# Patient Record
Sex: Male | Born: 1950 | Race: White | Hispanic: No | Smoking: Former smoker
Health system: Southern US, Community
[De-identification: ages and names within clinical notes are randomized; demographics above are authoritative.]

## PROBLEM LIST (undated history)

## (undated) DIAGNOSIS — I639 Cerebral infarction, unspecified: Secondary | ICD-10-CM

## (undated) DIAGNOSIS — R05 Cough: Secondary | ICD-10-CM

## (undated) DIAGNOSIS — G8929 Other chronic pain: Secondary | ICD-10-CM

## (undated) DIAGNOSIS — M542 Cervicalgia: Secondary | ICD-10-CM

## (undated) DIAGNOSIS — Z923 Personal history of irradiation: Secondary | ICD-10-CM

## (undated) DIAGNOSIS — K449 Diaphragmatic hernia without obstruction or gangrene: Secondary | ICD-10-CM

## (undated) DIAGNOSIS — B1920 Unspecified viral hepatitis C without hepatic coma: Secondary | ICD-10-CM

## (undated) DIAGNOSIS — C3490 Malignant neoplasm of unspecified part of unspecified bronchus or lung: Secondary | ICD-10-CM

## (undated) DIAGNOSIS — M1990 Unspecified osteoarthritis, unspecified site: Secondary | ICD-10-CM

## (undated) DIAGNOSIS — R059 Cough, unspecified: Secondary | ICD-10-CM

## (undated) DIAGNOSIS — M549 Dorsalgia, unspecified: Secondary | ICD-10-CM

## (undated) DIAGNOSIS — I4891 Unspecified atrial fibrillation: Secondary | ICD-10-CM

## (undated) DIAGNOSIS — K5790 Diverticulosis of intestine, part unspecified, without perforation or abscess without bleeding: Secondary | ICD-10-CM

## (undated) DIAGNOSIS — G4700 Insomnia, unspecified: Secondary | ICD-10-CM

## (undated) DIAGNOSIS — Z860100 Personal history of colon polyps, unspecified: Secondary | ICD-10-CM

## (undated) DIAGNOSIS — F419 Anxiety disorder, unspecified: Secondary | ICD-10-CM

## (undated) DIAGNOSIS — K219 Gastro-esophageal reflux disease without esophagitis: Secondary | ICD-10-CM

## (undated) DIAGNOSIS — D171 Benign lipomatous neoplasm of skin and subcutaneous tissue of trunk: Principal | ICD-10-CM

## (undated) DIAGNOSIS — K222 Esophageal obstruction: Secondary | ICD-10-CM

## (undated) DIAGNOSIS — Z8719 Personal history of other diseases of the digestive system: Secondary | ICD-10-CM

## (undated) HISTORY — DX: Cerebral infarction, unspecified: I63.9

## (undated) HISTORY — DX: Diaphragmatic hernia without obstruction or gangrene: K44.9

## (undated) HISTORY — DX: Malignant neoplasm of unspecified part of unspecified bronchus or lung: C34.90

## (undated) HISTORY — DX: Unspecified osteoarthritis, unspecified site: M19.90

## (undated) HISTORY — DX: Gastro-esophageal reflux disease without esophagitis: K21.9

## (undated) HISTORY — DX: Esophageal obstruction: K22.2

## (undated) HISTORY — DX: Diverticulosis of intestine, part unspecified, without perforation or abscess without bleeding: K57.90

## (undated) HISTORY — DX: Benign lipomatous neoplasm of skin and subcutaneous tissue of trunk: D17.1

## (undated) HISTORY — PX: APPENDECTOMY: SHX54

## (undated) HISTORY — DX: Unspecified viral hepatitis C without hepatic coma: B19.20

## (undated) HISTORY — PX: INNER EAR SURGERY: SHX679

## (undated) HISTORY — PX: COLONOSCOPY: SHX174

---

## 1999-12-16 ENCOUNTER — Encounter: Payer: Self-pay | Admitting: Emergency Medicine

## 1999-12-16 ENCOUNTER — Emergency Department (HOSPITAL_COMMUNITY): Admission: EM | Admit: 1999-12-16 | Discharge: 1999-12-16 | Payer: Self-pay | Admitting: Emergency Medicine

## 2002-08-29 ENCOUNTER — Encounter: Payer: Self-pay | Admitting: *Deleted

## 2002-08-29 ENCOUNTER — Ambulatory Visit (HOSPITAL_COMMUNITY): Admission: RE | Admit: 2002-08-29 | Discharge: 2002-08-29 | Payer: Self-pay | Admitting: *Deleted

## 2002-09-11 ENCOUNTER — Encounter: Payer: Self-pay | Admitting: *Deleted

## 2002-09-13 ENCOUNTER — Ambulatory Visit (HOSPITAL_COMMUNITY): Admission: RE | Admit: 2002-09-13 | Discharge: 2002-09-13 | Payer: Self-pay | Admitting: *Deleted

## 2002-11-04 ENCOUNTER — Ambulatory Visit (HOSPITAL_BASED_OUTPATIENT_CLINIC_OR_DEPARTMENT_OTHER): Admission: RE | Admit: 2002-11-04 | Discharge: 2002-11-04 | Payer: Self-pay | Admitting: Orthopedic Surgery

## 2002-12-06 ENCOUNTER — Ambulatory Visit (HOSPITAL_COMMUNITY): Admission: RE | Admit: 2002-12-06 | Discharge: 2002-12-06 | Payer: Self-pay | Admitting: Orthopedic Surgery

## 2002-12-06 ENCOUNTER — Encounter: Payer: Self-pay | Admitting: Orthopedic Surgery

## 2003-01-01 ENCOUNTER — Encounter (INDEPENDENT_AMBULATORY_CARE_PROVIDER_SITE_OTHER): Payer: Self-pay | Admitting: Specialist

## 2003-01-01 ENCOUNTER — Ambulatory Visit (HOSPITAL_COMMUNITY): Admission: RE | Admit: 2003-01-01 | Discharge: 2003-01-01 | Payer: Self-pay | Admitting: Orthopedic Surgery

## 2003-02-25 ENCOUNTER — Encounter: Admission: RE | Admit: 2003-02-25 | Discharge: 2003-05-26 | Payer: Self-pay | Admitting: Orthopedic Surgery

## 2003-03-15 HISTORY — PX: HAND SURGERY: SHX662

## 2003-05-29 ENCOUNTER — Ambulatory Visit (HOSPITAL_COMMUNITY): Admission: RE | Admit: 2003-05-29 | Discharge: 2003-05-29 | Payer: Self-pay | Admitting: Orthopedic Surgery

## 2003-07-09 ENCOUNTER — Ambulatory Visit (HOSPITAL_COMMUNITY): Admission: RE | Admit: 2003-07-09 | Discharge: 2003-07-09 | Payer: Self-pay | Admitting: Orthopedic Surgery

## 2003-07-09 ENCOUNTER — Ambulatory Visit (HOSPITAL_BASED_OUTPATIENT_CLINIC_OR_DEPARTMENT_OTHER): Admission: RE | Admit: 2003-07-09 | Discharge: 2003-07-09 | Payer: Self-pay | Admitting: Orthopedic Surgery

## 2003-08-19 ENCOUNTER — Encounter: Admission: RE | Admit: 2003-08-19 | Discharge: 2003-10-17 | Payer: Self-pay | Admitting: Orthopedic Surgery

## 2003-12-05 ENCOUNTER — Inpatient Hospital Stay (HOSPITAL_COMMUNITY): Admission: RE | Admit: 2003-12-05 | Discharge: 2003-12-06 | Payer: Self-pay | Admitting: Neurosurgery

## 2004-05-27 ENCOUNTER — Ambulatory Visit: Payer: Self-pay | Admitting: Cardiology

## 2004-05-31 ENCOUNTER — Ambulatory Visit: Payer: Self-pay | Admitting: Cardiology

## 2004-06-02 ENCOUNTER — Ambulatory Visit: Payer: Self-pay | Admitting: Cardiology

## 2004-06-03 ENCOUNTER — Inpatient Hospital Stay (HOSPITAL_BASED_OUTPATIENT_CLINIC_OR_DEPARTMENT_OTHER): Admission: RE | Admit: 2004-06-03 | Discharge: 2004-06-03 | Payer: Self-pay | Admitting: Cardiology

## 2004-06-03 HISTORY — PX: CARDIAC CATHETERIZATION: SHX172

## 2004-06-22 ENCOUNTER — Ambulatory Visit: Payer: Self-pay | Admitting: Cardiology

## 2004-10-06 ENCOUNTER — Ambulatory Visit: Payer: Self-pay | Admitting: Cardiology

## 2005-03-23 ENCOUNTER — Ambulatory Visit: Payer: Self-pay | Admitting: Cardiology

## 2005-04-01 ENCOUNTER — Ambulatory Visit (HOSPITAL_COMMUNITY): Admission: RE | Admit: 2005-04-01 | Discharge: 2005-04-01 | Payer: Self-pay | Admitting: Neurosurgery

## 2005-04-26 ENCOUNTER — Inpatient Hospital Stay (HOSPITAL_COMMUNITY): Admission: AD | Admit: 2005-04-26 | Discharge: 2005-04-29 | Payer: Self-pay | Admitting: Neurosurgery

## 2005-09-15 ENCOUNTER — Ambulatory Visit: Payer: Self-pay | Admitting: Cardiology

## 2006-09-22 ENCOUNTER — Ambulatory Visit: Payer: Self-pay | Admitting: Internal Medicine

## 2006-10-03 ENCOUNTER — Ambulatory Visit: Payer: Self-pay | Admitting: Internal Medicine

## 2006-10-03 ENCOUNTER — Encounter: Payer: Self-pay | Admitting: Internal Medicine

## 2006-10-03 DIAGNOSIS — K648 Other hemorrhoids: Secondary | ICD-10-CM | POA: Insufficient documentation

## 2006-10-03 DIAGNOSIS — K219 Gastro-esophageal reflux disease without esophagitis: Secondary | ICD-10-CM

## 2006-10-03 DIAGNOSIS — K449 Diaphragmatic hernia without obstruction or gangrene: Secondary | ICD-10-CM | POA: Insufficient documentation

## 2006-10-03 DIAGNOSIS — K5730 Diverticulosis of large intestine without perforation or abscess without bleeding: Secondary | ICD-10-CM | POA: Insufficient documentation

## 2006-10-03 DIAGNOSIS — K222 Esophageal obstruction: Secondary | ICD-10-CM | POA: Insufficient documentation

## 2006-10-03 DIAGNOSIS — K2940 Chronic atrophic gastritis without bleeding: Secondary | ICD-10-CM | POA: Insufficient documentation

## 2006-10-15 ENCOUNTER — Encounter: Admission: RE | Admit: 2006-10-15 | Discharge: 2006-10-15 | Payer: Self-pay | Admitting: Oncology

## 2006-10-16 ENCOUNTER — Ambulatory Visit: Payer: Self-pay | Admitting: Cardiology

## 2006-10-25 ENCOUNTER — Ambulatory Visit: Payer: Self-pay | Admitting: Cardiology

## 2007-03-15 HISTORY — PX: OTHER SURGICAL HISTORY: SHX169

## 2007-03-15 HISTORY — PX: NECK SURGERY: SHX720

## 2007-07-23 ENCOUNTER — Encounter: Admission: RE | Admit: 2007-07-23 | Discharge: 2007-07-23 | Payer: Self-pay | Admitting: Family Medicine

## 2007-08-17 ENCOUNTER — Encounter: Admission: RE | Admit: 2007-08-17 | Discharge: 2007-08-17 | Payer: Self-pay | Admitting: Otolaryngology

## 2008-05-19 ENCOUNTER — Encounter: Payer: Self-pay | Admitting: Internal Medicine

## 2008-11-26 ENCOUNTER — Encounter: Payer: Self-pay | Admitting: Internal Medicine

## 2009-05-25 ENCOUNTER — Telehealth (INDEPENDENT_AMBULATORY_CARE_PROVIDER_SITE_OTHER): Payer: Self-pay | Admitting: *Deleted

## 2009-05-25 ENCOUNTER — Encounter: Payer: Self-pay | Admitting: Internal Medicine

## 2009-06-24 ENCOUNTER — Telehealth: Payer: Self-pay | Admitting: Internal Medicine

## 2009-06-25 ENCOUNTER — Telehealth: Payer: Self-pay | Admitting: Internal Medicine

## 2009-07-29 ENCOUNTER — Ambulatory Visit: Payer: Self-pay | Admitting: Internal Medicine

## 2009-07-29 DIAGNOSIS — Z860100 Personal history of colon polyps, unspecified: Secondary | ICD-10-CM | POA: Insufficient documentation

## 2009-07-29 DIAGNOSIS — R1319 Other dysphagia: Secondary | ICD-10-CM | POA: Insufficient documentation

## 2009-10-13 ENCOUNTER — Ambulatory Visit: Payer: Self-pay | Admitting: Internal Medicine

## 2009-10-14 ENCOUNTER — Encounter: Payer: Self-pay | Admitting: Internal Medicine

## 2009-10-20 ENCOUNTER — Encounter (INDEPENDENT_AMBULATORY_CARE_PROVIDER_SITE_OTHER): Payer: Self-pay | Admitting: *Deleted

## 2009-10-20 ENCOUNTER — Encounter: Payer: Self-pay | Admitting: Internal Medicine

## 2009-10-20 ENCOUNTER — Telehealth (INDEPENDENT_AMBULATORY_CARE_PROVIDER_SITE_OTHER): Payer: Self-pay | Admitting: *Deleted

## 2010-01-05 ENCOUNTER — Ambulatory Visit (HOSPITAL_COMMUNITY): Admission: RE | Admit: 2010-01-05 | Discharge: 2010-01-05 | Payer: Self-pay | Admitting: Internal Medicine

## 2010-01-05 ENCOUNTER — Ambulatory Visit: Payer: Self-pay | Admitting: Internal Medicine

## 2010-03-04 ENCOUNTER — Emergency Department (HOSPITAL_COMMUNITY)
Admission: EM | Admit: 2010-03-04 | Discharge: 2010-03-05 | Payer: Self-pay | Source: Home / Self Care | Admitting: Emergency Medicine

## 2010-04-13 NOTE — Procedures (Signed)
Summary: Colonoscopy  Patient: Cameron Roman Note: All result statuses are Final unless otherwise noted.  Tests: (1) Colonoscopy (COL)   COL Colonoscopy           DONE     [facility]     [address]     [place], [place]  [address]           COLONOSCOPY PROCEDURE REPORT           PATIENT:  Cameron Roman, Cameron Roman  MR#:  [id number]     BIRTHDATE:  11/13/1950, 58 yrs. old  GENDER:  male     ENDOSCOPIST:  Wilhemina Bonito. Eda Keys, MD     REF. BY:  Surveillance Program Recall,     PROCEDURE DATE:  10/13/2009     PROCEDURE:  Colonoscopy with snare polypectomy x 3     ASA CLASS:  Class II     INDICATIONS:  history of pre-cancerous (adenomatous) colon polyps,     surveillance and high-risk screening, family history of colon     cancer index exam 09-2006 w/ 15mm cecal adenoma;     Parent w/ CRC 70s     MEDICATIONS:   Fentanyl 100 mcg IV, Versed 10 mg IV, Benadryl 25     mcg IV           DESCRIPTION OF PROCEDURE:   After the risks benefits and     alternatives of the procedure were thoroughly explained, informed     consent was obtained.  Digital rectal exam was performed and     revealed no abnormalities.   The LB [id number] endoscope     was introduced through the anus and advanced to the cecum, which     was identified by both the appendix and ileocecal valve, limited     by fair prep. Time to cecum = 5:47 min.   The quality of the prep     was good, using MoviPrep.  The instrument was then slowly     withdrawn (time = 20:04 min) as the colon was fully examined.     <<PROCEDUREIMAGES>>           FINDINGS:  A 7mm sessile polyp was found in the ascending colon.     Polyp was snared without cautery. Retrieval was successful.   A     12mm sessile polyp was found in the ascending colon. Polyp was     snared, then cauterized with monopolar cautery. Retrieval was     successful.   A 7mm sessile polyp was found in the proximal     transverse colon. Polyp was snared without cautery.  Retrieval was     successful. Severe diverticulosis was found in the left colon.     Retroflexed views in the rectum revealed internal hemorrhoids. The     exam was somewhat compromised by the presence of dense leafy     vegetative matter.   The scope was then withdrawn from the patient     and the procedure completed.           COMPLICATIONS:  None     ENDOSCOPIC IMPRESSION:     1) Sessile polyp in the ascending colon -removed     2) Sessile polyp in the ascending colon- removed     3) Sessile polyp in the proximal transverse colon - removed     4) Severe diverticulosis in the left colon     5) Internal hemorrhoids  RECOMMENDATIONS:     1) Follow up colonoscopy in 2 years           ______________________________     Wilhemina Bonito. Eda Keys, MD           CC:  Rudi Heap, MD; the Patient           n.     eSIGNED:   Wilhemina Bonito. Eda Keys at 10/13/2009 01:00 PM           Dian Queen, [id number]  Note: An exclamation mark (!) indicates a result that was not dispersed into the flowsheet. Document Creation Date: 10/13/2009 1:01 PM _______________________________________________________________________  (1) Order result status: Final Collection or observation date-time: 10/13/2009 12:52 Requested date-time:  Receipt date-time:  Reported date-time:  Referring Physician:   Ordering Physician: Fransico Setters [phone]) Specimen Source:  Source: Launa Grill Order Number: [id number] Lab site:   Appended Document: Colonoscopy     Procedures Next Due Date:    Colonoscopy: 10/2011

## 2010-04-13 NOTE — Letter (Signed)
Summary: [facility] Instructions  [facility]  [address] [place], [place] [address]   Phone: [phone]  Fax: [phone]       Cameron Roman    February 04, 1951    MRN: [id number]        Procedure Day /Date:TUESDAY, 09/15/09     Arrival Time:9:00 AM     Procedure Time:10:00 AM     Location of Procedure:                    [facility] (4th Floor)   PREPARATION FOR COLONOSCOPY WITH MOVIPREP/ENDO   Starting 5 days prior to your procedure 09/10/09 do not eat nuts, seeds, popcorn, corn, beans, peas,  salads, or any raw vegetables.  Do not take any fiber supplements (e.g. Metamucil, Citrucel, and Benefiber).  THE DAY BEFORE YOUR PROCEDURE         DATE: 09/14/09  ONG:EXBMWU  1.  Drink clear liquids the entire day-NO SOLID FOOD  2.  Do not drink anything colored red or purple.  Avoid juices with pulp.  No orange juice.  3.  Drink at least 64 oz. (8 glasses) of fluid/clear liquids during the day to prevent dehydration and help the prep work efficiently.  CLEAR LIQUIDS INCLUDE: Water Jello Ice Popsicles Tea (sugar ok, no milk/cream) Powdered fruit flavored drinks Coffee (sugar ok, no milk/cream) Gatorade Juice: apple, white grape, white cranberry  Lemonade Clear bullion, consomm, broth Carbonated beverages (any kind) Strained chicken noodle soup Hard Candy                             4.  In the morning, mix first dose of MoviPrep solution:    Empty 1 Pouch A and 1 Pouch B into the disposable container    Add lukewarm drinking water to the top line of the container. Mix to dissolve    Refrigerate (mixed solution should be used within 24 hrs)  5.  Begin drinking the prep at 5:00 p.m. The MoviPrep container is divided by 4 marks.   Every 15 minutes drink the solution down to the next mark (approximately 8 oz) until the full liter is complete.   6.  Follow completed prep with 16 oz of clear liquid of your choice (Nothing red or purple).  Continue  to drink clear liquids until bedtime.  7.  Before going to bed, mix second dose of MoviPrep solution:    Empty 1 Pouch A and 1 Pouch B into the disposable container    Add lukewarm drinking water to the top line of the container. Mix to dissolve    Refrigerate  THE DAY OF YOUR PROCEDURE      DATE: 7/5/11DAY:TUESDAY  Beginning at5:00 a.m. (5 hours before procedure):         1. Every 15 minutes, drink the solution down to the next mark (approx 8 oz) until the full liter is complete.  2. Follow completed prep with 16 oz. of clear liquid of your choice.    3. You may drink clear liquids until 8:00 AM (2 HOURS BEFORE PROCEDURE).   MEDICATION INSTRUCTIONS  Unless otherwise instructed, you should take regular prescription medications with a small sip of water   as early as possible the morning of your procedure.           OTHER INSTRUCTIONS  You will need a responsible adult at least 59 years of age to accompany you  and drive you home.   This person must remain in the waiting room during your procedure.  Wear loose fitting clothing that is easily removed.  Leave jewelry and other valuables at home.  However, you may wish to bring a book to read or  an iPod/MP3 player to listen to music as you wait for your procedure to start.  Remove all body piercing jewelry and leave at home.  Total time from sign-in until discharge is approximately 2-3 hours.  You should go home directly after your procedure and rest.  You can resume normal activities the  day after your procedure.  The day of your procedure you should not:   Drive   Make legal decisions   Operate machinery   Drink alcohol   Return to work  You will receive specific instructions about eating, activities and medications before you leave.    The above instructions have been reviewed and explained to me by   _______________________    I fully understand and can verbalize these instructions  _____________________________ Date _________

## 2010-04-13 NOTE — Progress Notes (Signed)
   Phone Note Outgoing Call   Summary of Call: Pt. notified that it has been since 09/18/08 since he was seen by Dr.Perry and I will give him 1 refill till ov.Given appt. for 06/16/2009. Initial call taken by: Teryl Lucy RN,  May 25, 2009 2:08 PM

## 2010-04-13 NOTE — Miscellaneous (Signed)
Summary: sav. dil order  Clinical Lists Changes  Orders: Added new Test order of ZENDO with [facility]. Dil (ZENDO/Sav Dil.) - Signed

## 2010-04-13 NOTE — Letter (Signed)
Summary: Patient Notice- Polyp Results  [facility]  [address] [place], [place] [address]   Phone: [phone]  Fax: [phone]        October 14, 2009 MRN: [id number]    [facility] [address] [place], [place]  [address]    Dear [name],  I am pleased to inform you that the colon polyp(s) removed during your recent colonoscopy was (were) found to be benign (no cancer detected) upon pathologic examination.  I recommend you have a repeat colonoscopy examination in 2 years to look for recurrent polyps, as having colon polyps increases your risk for having recurrent polyps or even colon cancer in the future.  Should you develop new or worsening symptoms of abdominal pain, bowel habit changes or bleeding from the rectum or bowels, please schedule an evaluation with either your primary care physician or with me.  Additional information/recommendations:  __ No further action with gastroenterology is needed at this time. Please      follow-up with your primary care physician for your other healthcare      needs.   Please call us if you are having persistent problems or have questions about your condition that have not been fully answered at this time.  Sincerely,  Hilarie Fredrickson MD  This letter has been electronically signed by your physician.  Appended Document: Patient Notice- Polyp Results letter mailed

## 2010-04-13 NOTE — Procedures (Signed)
Summary: Upper Endoscopy  Patient: Cameron Roman Note: All result statuses are Final unless otherwise noted.  Tests: (1) Upper Endoscopy (EGD)   EGD Upper Endoscopy       DONE     [facility]     [address] [place], [place]  [address]           ENDOSCOPY PROCEDURE REPORT           PATIENT:  [name], [name]  MR#:  [id number]     BIRTHDATE:  [birth year], 59 yrs. old  GENDER:  male           ENDOSCOPIST:  Wilhemina Bonito. Eda Keys, MD     Referred by:  Office           PROCEDURE DATE:  01/05/2010     PROCEDURE:  EGD with dilatation over guidewire - 18mm     FLUOROSCOPY TO ASSIST WITH     ESOPHAGEAL STRICTURE DILATION     ASA CLASS:  Class II     INDICATIONS:  dilation of esophageal stricture           MEDICATIONS:   Fentanyl 100 mcg IV, Versed 10 mg IV, Benadryl 25     mg IV     TOPICAL ANESTHETIC:  Cetacaine Spray           DESCRIPTION OF PROCEDURE:   After the risks benefits and     alternatives of the procedure were thoroughly explained, informed     consent was obtained.  The Pentax Gastroscope [id number] endoscope     was introduced through the mouth and advanced to the second     portion of the duodenum, without limitations.  The instrument was     slowly withdrawn as the mucosa was fully examined.     <<PROCEDUREIMAGES>>           A 15mm benign ring-like stricture was found in the distal     esophagus.  The stomach was entered and closely examined. The     antrum, angularis, and lesser curvature were well visualized,     including a retroflexed view of the cardia and fundus. The stomach     wall was normally distensable. The scope passed easily through the     pylorus into the duodenum.  The duodenal bulb was normal in     appearance, as was the postbulbar duodenum.    Retroflexed views     revealed a hiatal hernia.           THERAPY: SAVARY DILATION OVER FLUOROSCOPICALLY PLACED WIRE W/O     RESISTANCE OR HEME. TOLERATED WELL           COMPLICATIONS:  None       ENDOSCOPIC IMPRESSION:     1) Stricture in the distal esophagus     2) Normal stomach     3) Normal duodenum     4) A hiatal hernia     RECOMMENDATIONS:     1) Clear liquids until 11AM, then soft foods rest of day. Resume     prior diet tomorrow.     2) Continue PRILOSEC DAILY     3) follow-up PRN           ______________________________     Wilhemina Bonito. Eda Keys, MD           CC:  Rudi Heap, MD, The Patient           n.  eSIGNED:   Wilhemina Bonito. Eda Keys at 01/05/2010 09:16 AM           Dian Queen, [id number]  Note: An exclamation mark (!) indicates a result that was not dispersed into the flowsheet. Document Creation Date: 01/05/2010 9:17 AM _______________________________________________________________________  (1) Order result status: Final Collection or observation date-time: 01/05/2010 09:05 Requested date-time:  Receipt date-time:  Reported date-time:  Referring Physician:   Ordering Physician: Fransico Setters [phone]) Specimen Source:  Source: Launa Grill Order Number: [id number] Lab site:

## 2010-04-13 NOTE — Medication Information (Signed)
Summary: [facility]   Imported By: Lester Belle [place]che 06/01/2009 09:26:49  _____________________________________________________________________  External Attachment:    Type:   Image     Comment:   External Document

## 2010-04-13 NOTE — Progress Notes (Signed)
Summary: Rx. Denial for Omeprazole   Phone Note Refill Request Message from:  Fax from Pharmacy on June 24, 2009 4:27 PM  Fax received for refill of his Omeprazole.  Pt. was given #30 last month and was told he had to see Dr. Marina Goodell before anymore refills are given.  He made an appt. 06/16/09 and no showed.  Refill has been denied and faxed to pharmacy.  Milford Cage [facility]  June 24, 2009 4:29 PM   Initial call taken by: Milford Cage [place],  June 24, 2009 4:28 PM

## 2010-04-13 NOTE — Assessment & Plan Note (Signed)
Summary: GERD, pain with swallowing, colon polyps    History of Present Illness Visit Type: Follow-up Visit Primary GI MD: Yancey Flemings MD Primary Provider: Rudi Heap, MD  Requesting Provider: n/a Chief Complaint: Pt needs refill on Omeprazole. Pt denies any GI complaints  History of Present Illness:   60 year old white male with a history of degenerative spine disease for which he has undergone neck and back surgery, GERD with peptic stricture, adenomatous colon polyp, and diverticulosis. The patient underwent colonoscopy and upper endoscopy for family history of colon cancer and epigastric pain with nausea and vomiting respectively. Examinations were performed in July of 2008. Colonoscopy revealed internal hemorrhoids, left-sided diverticulosis, and a 15 mm sessile polyp of the cecum that was removed in a piecemeal fashion and found to be adenomatous. Followup in 3 years recommended. He denies lower GI complaints at this time. Next, upper endoscopy revealed esophageal stricture, however hernia, and chronic gastritis. Rapid urease testing performed at that time. Results not available today. He presents now with a new complaint of intermittent solid food dysphagia to pound cake. This occurs every few weeks. He points to the epigastric region. He is taking omeprazole daily. No active heartburn. He request medication refill. No weight loss. No liquid dysphagia.   GI Review of Systems    Reports abdominal pain, acid reflux, and  dysphagia with solids.     Location of  Abdominal pain: epigastric area.    Denies belching, bloating, chest pain, dysphagia with liquids, heartburn, loss of appetite, nausea, vomiting, vomiting blood, weight loss, and  weight gain.      Reports diverticulosis and  hemorrhoids.     Denies anal fissure, black tarry stools, change in bowel habit, constipation, diarrhea, fecal incontinence, heme positive stool, irritable bowel syndrome, jaundice, light color stool, liver  problems, rectal bleeding, and  rectal pain.    Current Medications (verified): 1)  Omeprazole 20 Mg Cpdr (Omeprazole) .Marland Kitchen.. 1 Capsule By Mouth Once Daily 2)  Hydrocodone-Acetaminophen 10-500 Mg Tabs (Hydrocodone-Acetaminophen) .... One Tablet By Mouth Two Times A Day 3)  Amitriptyline Hcl 50 Mg Tabs (Amitriptyline Hcl) .... One Tablet By Mouth At Bedtime  Allergies (verified): No Known Drug Allergies  Past History:  Past Medical History: Reviewed history from 07/02/2007 and no changes required. Current Problems:  HEMORRHOIDS, INTERNAL (ICD-455.0) DIVERTICULOSIS, COLON (ICD-562.10) GERD (ICD-530.81) GASTRITIS, CHRONIC (ICD-535.10) ESOPHAGEAL STRICTURE (ICD-530.3) HIATAL HERNIA (ICD-553.3)  Past Surgical History: Reviewed history from 07/02/2007 and no changes required. Appendectomy  Family History: Family History of Colon Cancer:Father, and MGF   Social History: Occupation: Unemployed--disabled Patient is a former smoker: quit 6 yrs ago  Alcohol Use - yes: one beer occ  Daily Caffeine Use: pepsi daily  Illicit Drug Use - no Smoking Status:  quit Drug Use:  no  Review of Systems       The patient complains of arthritis/joint pain and back pain.  The patient denies allergy/sinus, anemia, anxiety-new, blood in urine, breast changes/lumps, change in vision, confusion, cough, coughing up blood, depression-new, fainting, fatigue, fever, headaches-new, hearing problems, heart murmur, heart rhythm changes, itching, muscle pains/cramps, night sweats, nosebleeds, shortness of breath, skin rash, sleeping problems, sore throat, swelling of feet/legs, swollen lymph glands, thirst - excessive, urination - excessive, urination changes/pain, urine leakage, vision changes, and voice change.    Vital Signs:  Patient profile:   60 year old male Height:      71 inches Weight:      158 pounds BMI:     22.12 BSA:  1.91 Pulse rate:   88 / minute Pulse rhythm:   regular BP sitting:   128  / 84  (left arm) Cuff size:   regular  Vitals Entered By: Ok Anis CMA (Jul 29, 2009 8:09 AM)  Physical Exam  General:  Well developed, well nourished, no acute distress. Head:  Normocephalic and atraumatic. Eyes:  PERRLA, no icterus. Nose:  No deformity, discharge,  or lesions. Mouth:  No deformity or lesions, Neck:  Supple; no masses or thyromegaly. Lungs:  Clear throughout to auscultation. Heart:  Regular rate and rhythm; no murmurs, rubs,  or bruits. Abdomen:  Soft, nontender and nondistended. No masses, hepatosplenomegaly or hernias noted. Normal bowel sounds. Rectal:  referred until colonoscopy Msk:  Symmetrical with no gross deformities. Normal posture. Pulses:  Normal pulses noted. Extremities:  No clubbing, cyanosis, edema or deformities noted. Neurologic:  Alert and  oriented x4;  grossly normal neurologically. Skin:  Intact without significant lesions or rashes. Cervical Nodes:  No significant cervical adenopathy.no supraclavicular adenopathy Psych:  Alert and cooperative. Normal mood and affect.   Impression & Recommendations:  Problem # 1:  DYSPHAGIA (ICD-787.29) intermittent solid food dysphagia likely due to known peptic stricture.  Plan: #1. Upper endoscopy with possible esophageal dilation. The nature of the procedure as well as the risks, benefits, and alternatives have been reviewed. He understood and agreed to proceed.  Problem # 2:  GERD (ICD-530.81) GERD. Classic symptoms controlled with omeprazole.  Plan: #1. Refill omeprazole 20 mg daily. Prescription electronically submitted #2. Reflux precautions  Problem # 3:  GASTRITIS, CHRONIC (ICD-535.10) noticed on previous endoscopy. Plan to followup on her CLOtest results  Problem # 4:  PERSONAL HISTORY OF COLONIC POLYPS (ICD-V12.72) history of sessile adenomatous polyp of the cecum removed piecemeal fashion. Due for followup surveillance colonoscopy. No contraindications.  Plan: #1. Colonoscopy. The  nature of the procedure as well as the risks, benefits, and alternatives were reviewed in detail. He understood and agreed to proceed. #2. Movi prep prescribed. The patient instructed on its use  Problem # 5:  FAMILY HX COLON CANCER (ICD-V16.0) father. 19s.  Other Orders: Colon/Endo (Colon/Endo)  Patient Instructions: 1)  Colon/Endo [facility]  09/15/09 10:00 am arrive at 9:00 am 2)  Movi prep instructions given and Rx. printed. 3)  Omperazole Rx. refilled x 1 year. 4)  Colonoscopy and Flexible Sigmoidoscopy brochure given.  5)  Upper Endoscopy brochure given.  6)  The medication list was reviewed and reconciled.  All changed / newly prescribed medications were explained.  A complete medication list was provided to the patient / caregiver. 7)  printed and given to pt. Milford Cage NCMA  Jul 29, 2009 9:15 AM 8)  copy: Dr. Rudi Heap Prescriptions: OMEPRAZOLE 20 MG CPDR (OMEPRAZOLE) 1 capsule by mouth once daily  #30 x 11   Entered by:   Milford Cage NCMA   Authorized by:   Hilarie Fredrickson MD   Signed by:   Milford Cage NCMA on 07/29/2009   Method used:   Print then Give to Patient   RxID:   [id number] MOVIPREP 100 GM  SOLR (PEG-KCL-NACL-NASULF-NA ASC-C) As per prep instructions.  #1 x 0   Entered by:   Milford Cage NCMA   Authorized by:   Hilarie Fredrickson MD   Signed by:   Milford Cage NCMA on 07/29/2009   Method used:   Print then Give to Patient   RxID:   [id number]

## 2010-04-13 NOTE — Letter (Signed)
Summary: EGD Instructions  [facility]  [address] [place], [place] [address]   Phone: [phone]  Fax: [phone]       Cameron Roman    1950-09-10    MRN: [id number]       Procedure Day Dorna Bloom -October 25,2011     Arrival Time: 7:30 a.m.    Procedure Time:8:30 a.m.     Location of Procedure:                    _  [facility] ([facility] _ X _ [facility] ( Outpatient Registration) _  _ [facility] (Short Stay on [address]   PREPARATION FOR ENDOSCOPY   On TUESDAY THE DAY OF THE PROCEDURE:  1.   No solid foods, milk or milk products are allowed after midnight the night before your procedure.  2.   Do not drink anything colored red or purple.  Avoid juices with pulp.  No orange juice.  3.  You may drink clear liquids until 4:30 a.m. which is 4 hours before your procedure.                                                                                                CLEAR LIQUIDS INCLUDE: Water Jello Ice Popsicles Tea (sugar ok, no milk/cream) Powdered fruit flavored drinks Coffee (sugar ok, no milk/cream) Gatorade Juice: apple, white grape, white cranberry  Lemonade Clear bullion, consomm, broth Carbonated beverages (any kind) Strained chicken noodle soup Hard Candy   MEDICATION INSTRUCTIONS  Unless otherwise instructed, you should take regular prescription medications with a small sip of water as early as possible the morning of your procedure.           OTHER INSTRUCTIONS  You will need a responsible adult at least 59 years of age to accompany you and drive you home.   This person must remain in the waiting room during your procedure.  Wear loose fitting clothing that is easily removed.  Leave jewelry and other valuables at home.  However, you may wish to bring a book to read or an iPod/MP3 player to listen to music as you wait for your procedure to start.  Remove all body piercing jewelry and  leave at home.  Total time from sign-in until discharge is approximately 2-3 hours.  You should go home directly after your procedure and rest.  You can resume normal activities the day after your procedure.  The day of your procedure you should not:   Drive   Make legal decisions   Operate machinery   Drink alcohol   Return to work  You will receive specific instructions about eating, activities and medications before you leave.    The above instructions have been mailed to pt.by Georgina Peer.N   10/20/09

## 2010-04-13 NOTE — Procedures (Signed)
Summary: Upper Endoscopy  Patient: Cameron Roman Note: All result statuses are Final unless otherwise noted.  Tests: (1) Upper Endoscopy (EGD)   EGD Upper Endoscopy       DONE     [facility]     [address]     [place], [place]  [address]           ENDOSCOPY PROCEDURE REPORT           PATIENT:  Cameron Roman, Cameron Roman  MR#:  [id number]     BIRTHDATE:  [birth year], 58 yrs. old  GENDER:  male           ENDOSCOPIST:  Wilhemina Bonito. Eda Keys, MD     Referred by:  Office           PROCEDURE DATE:  10/13/2009     PROCEDURE:  EGD, diagnostic     ASA CLASS:  Class II     INDICATIONS:  dysphagia, GERD           MEDICATIONS:   There was residual sedation effect present from     prior procedure., Versed 2 mg IV     TOPICAL ANESTHETIC:  Exactacain Spray           DESCRIPTION OF PROCEDURE:   After the risks benefits and     alternatives of the procedure were thoroughly explained, informed     consent was obtained.  The LB GIF-H180 [id number] endoscope was     introduced through the mouth and advanced to the second portion of     the duodenum, without limitations.  The instrument was slowly     withdrawn as the mucosa was fully examined.     <<PROCEDUREIMAGES>>           The esophagus was tortuous with a 15mm ring-like stricture  in the     distal esophagus.  A moderate hiatal hernia was present.     Otherwise, the examination was normal to D2.    Retroflexed views     revealed the hiatal hernia.    The scope was then withdrawn from     the patient and the procedure completed. No blind stricture     dilation due to anatomy.           COMPLICATIONS:  None           ENDOSCOPIC IMPRESSION:     1) Stricture in the distal esophagus     2) Hiatal hernia     3) Otherwise normal examination     4) GERD           RECOMMENDATIONS:     1) EGD/DILATION at HOSPITAL ([facility] with C-arm)     2) Continue omeprazole           _____________________________     Wilhemina Bonito. Eda Keys, MD           CC:   Rudi Heap, MD, The Patient           n.     eSIGNED:   Wilhemina Bonito. Eda Keys at 10/13/2009 01:12 PM           Dian Queen, [id number]  Note: An exclamation mark (!) indicates a result that was not dispersed into the flowsheet. Document Creation Date: 10/13/2009 1:13 PM _______________________________________________________________________  (1) Order result status: Final Collection or observation date-time: 10/13/2009 13:04 Requested date-time:  Receipt date-time:  Reported date-time:  Referring Physician:   Ordering Physician: Fransico Setters [phone])  Specimen Source:  Source: Launa Grill Order Number: [id number] Lab site:

## 2010-04-13 NOTE — Progress Notes (Signed)
Summary: Out of meds  Medications Added OMEPRAZOLE 20 MG CPDR (OMEPRAZOLE) 1 capsule by mouth once daily       Phone Note Call from Patient Call back at Home Phone [phone]   Caller: Doylene Bode Call For: Dr Marina Goodell Reason for Call: Refill Medication Summary of Call: Is out of meds. Next available 07-29-09. Missed appt on 06-16-09 because he had surgery. Can he get either get worked in or get another refill until next availale. Does not have an appoinment scheduled yet. Initial call taken by: Leanor Kail [facility],  June 25, 2009 11:25 AM  Follow-up for Phone Call        Pt. has had multiple refills and has not been seen for 3 years.  Sent denial to pharmacy because he didnt keep appt. on 06/16/09.  He states that it was due to having surgery.  Do you want me to refill x 1 and call pt. to make a follow-up appt. or deny?  Milford Cage [facility]  June 25, 2009 2:22 PM   Additional Follow-up for Phone Call Additional follow up Details #1::        one refill. none thereafter until seen in office Called patient and left message for him to return my call. Milford Cage [facility]  June 25, 2009 4:40 PM [facility] pharmacy.  an appt. was made for 07/29/09 8:30 am and he is aware that no more will be given until he comes in to see Dr. Marina Goodell. Milford Cage [facility]  June 25, 2009 4:48 PM  Additional Follow-up by: Hilarie Fredrickson MD,  June 25, 2009 2:48 PM    New/Updated Medications: OMEPRAZOLE 20 MG CPDR (OMEPRAZOLE) 1 capsule by mouth once daily Prescriptions: OMEPRAZOLE 20 MG CPDR (OMEPRAZOLE) 1 capsule by mouth once daily  #30 x 0   Entered by:   Milford Cage NCMA   Authorized by:   Hilarie Fredrickson MD   Signed by:   Milford Cage NCMA on 06/25/2009   Method used:   Printed then faxed to ...       [facility]ospital [facility] (retail)       [address]       [place], [place]  [address]       Ph: [phone] or [phone]       Fax: [phone]   RxID:   [id number]

## 2010-04-13 NOTE — Progress Notes (Signed)
   Phone Note Outgoing Call   Summary of Call: Phone # disconnected. Pt. scheduled for Sav./dil on 10 25/11 at [facility] .Instructions mailed to pt. Initial call taken by: Teryl Lucy RN,  October 20, 2009 9:14 AM     Appended Document: Orders Update/    Clinical Lists Changes  Orders: Added new Test order of ZENDO with [facility]. Dil (ZENDO/Sav Dil.) - Signed

## 2010-04-19 ENCOUNTER — Ambulatory Visit: Payer: MEDICARE | Attending: Neurosurgery | Admitting: Physical Therapy

## 2010-04-19 DIAGNOSIS — M542 Cervicalgia: Secondary | ICD-10-CM | POA: Insufficient documentation

## 2010-04-19 DIAGNOSIS — IMO0001 Reserved for inherently not codable concepts without codable children: Secondary | ICD-10-CM | POA: Insufficient documentation

## 2010-04-19 DIAGNOSIS — M2569 Stiffness of other specified joint, not elsewhere classified: Secondary | ICD-10-CM | POA: Insufficient documentation

## 2010-04-19 DIAGNOSIS — R293 Abnormal posture: Secondary | ICD-10-CM | POA: Insufficient documentation

## 2010-04-21 ENCOUNTER — Ambulatory Visit: Payer: MEDICARE | Admitting: Physical Therapy

## 2010-04-23 ENCOUNTER — Ambulatory Visit: Payer: MEDICARE | Admitting: Physical Therapy

## 2010-04-27 ENCOUNTER — Ambulatory Visit: Payer: MEDICARE | Admitting: Physical Therapy

## 2010-04-28 ENCOUNTER — Ambulatory Visit: Payer: MEDICARE | Admitting: Physical Therapy

## 2010-04-30 ENCOUNTER — Ambulatory Visit: Payer: MEDICARE | Admitting: Physical Therapy

## 2010-05-03 ENCOUNTER — Ambulatory Visit: Payer: MEDICARE | Admitting: Physical Therapy

## 2010-05-04 ENCOUNTER — Ambulatory Visit: Payer: MEDICARE | Admitting: Physical Therapy

## 2010-05-05 ENCOUNTER — Ambulatory Visit: Payer: MEDICARE | Admitting: Physical Therapy

## 2010-05-07 ENCOUNTER — Ambulatory Visit: Payer: MEDICARE | Admitting: Physical Therapy

## 2010-05-10 ENCOUNTER — Ambulatory Visit: Payer: MEDICARE | Admitting: Physical Therapy

## 2010-05-12 ENCOUNTER — Ambulatory Visit: Payer: MEDICARE | Admitting: Physical Therapy

## 2010-05-14 ENCOUNTER — Ambulatory Visit: Payer: MEDICARE | Attending: Neurosurgery | Admitting: Physical Therapy

## 2010-05-14 DIAGNOSIS — IMO0001 Reserved for inherently not codable concepts without codable children: Secondary | ICD-10-CM | POA: Insufficient documentation

## 2010-05-14 DIAGNOSIS — R293 Abnormal posture: Secondary | ICD-10-CM | POA: Insufficient documentation

## 2010-05-14 DIAGNOSIS — M542 Cervicalgia: Secondary | ICD-10-CM | POA: Insufficient documentation

## 2010-05-14 DIAGNOSIS — M2569 Stiffness of other specified joint, not elsewhere classified: Secondary | ICD-10-CM | POA: Insufficient documentation

## 2010-05-17 ENCOUNTER — Ambulatory Visit: Payer: MEDICARE | Admitting: Physical Therapy

## 2010-05-19 ENCOUNTER — Ambulatory Visit: Payer: MEDICARE | Admitting: Physical Therapy

## 2010-05-21 ENCOUNTER — Ambulatory Visit: Payer: MEDICARE | Admitting: Physical Therapy

## 2010-05-24 ENCOUNTER — Ambulatory Visit: Payer: MEDICARE | Admitting: Physical Therapy

## 2010-05-26 ENCOUNTER — Ambulatory Visit: Payer: MEDICARE | Admitting: Physical Therapy

## 2010-05-28 ENCOUNTER — Ambulatory Visit: Payer: MEDICARE | Admitting: Physical Therapy

## 2010-05-31 ENCOUNTER — Ambulatory Visit: Payer: MEDICARE | Admitting: Physical Therapy

## 2010-06-01 ENCOUNTER — Ambulatory Visit: Payer: MEDICARE | Admitting: Physical Therapy

## 2010-06-02 ENCOUNTER — Encounter: Payer: MEDICARE | Admitting: Physical Therapy

## 2010-06-03 ENCOUNTER — Ambulatory Visit: Payer: MEDICARE | Admitting: *Deleted

## 2010-06-04 ENCOUNTER — Encounter: Payer: MEDICARE | Admitting: Physical Therapy

## 2010-06-07 ENCOUNTER — Ambulatory Visit: Payer: MEDICARE | Admitting: Physical Therapy

## 2010-06-09 ENCOUNTER — Ambulatory Visit: Payer: MEDICARE | Admitting: Physical Therapy

## 2010-06-11 ENCOUNTER — Ambulatory Visit: Payer: MEDICARE | Admitting: Physical Therapy

## 2010-06-14 ENCOUNTER — Ambulatory Visit: Payer: MEDICARE | Attending: Neurosurgery | Admitting: Physical Therapy

## 2010-06-14 DIAGNOSIS — IMO0001 Reserved for inherently not codable concepts without codable children: Secondary | ICD-10-CM | POA: Insufficient documentation

## 2010-06-14 DIAGNOSIS — M542 Cervicalgia: Secondary | ICD-10-CM | POA: Insufficient documentation

## 2010-06-14 DIAGNOSIS — M2569 Stiffness of other specified joint, not elsewhere classified: Secondary | ICD-10-CM | POA: Insufficient documentation

## 2010-06-14 DIAGNOSIS — R293 Abnormal posture: Secondary | ICD-10-CM | POA: Insufficient documentation

## 2010-06-16 ENCOUNTER — Ambulatory Visit: Payer: MEDICARE | Admitting: Physical Therapy

## 2010-06-21 ENCOUNTER — Ambulatory Visit: Payer: MEDICARE | Admitting: Physical Therapy

## 2010-06-23 ENCOUNTER — Ambulatory Visit: Payer: MEDICARE | Admitting: Physical Therapy

## 2010-06-25 ENCOUNTER — Ambulatory Visit: Payer: MEDICARE | Admitting: Physical Therapy

## 2010-07-27 NOTE — Assessment & Plan Note (Signed)
[facility]                          [facility] OFFICE NOTE   Cameron Roman, Cameron Roman                         MRN:          [id number]  DATE:10/16/2006                            DOB:          July 04, 1950    CARDIOLOGIST:  Dr. Lewayne Bunting.   PRIMARY CARE PHYSICIAN:  Dr. Rudi Heap, [facility].   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  He had a cardiac  catheterization done in 2006 that showed minimal irregularities  throughout without any obstructive coronary disease noted.  His LV  function has been normal in the past.  He returns today for a followup.  Overall, he says he is doing well.  Denies any chest pain or shortness  of breath.  Denies any syncope.  Denies any palpitations.  Denies any  orthopnea, PND, or pedal edema.  He does, however, note a symptom of  near syncope over the last several years.  These episodes come on  without warning.  He becomes nauseated and diaphoretic and near  syncopal.  This has never been worked up in the past.   CURRENT MEDICATIONS:  1. Prevacid 30 mg daily.  2. Amitriptyline 50 mg nightly.   ALLERGIES:  No known drug allergies.   SOCIAL HISTORY:  He has quit smoking now which I congratulated him on.   PHYSICAL EXAMINATION:  He is a well-nourished, well-developed male in no  distress.  Blood pressure 115/80, pulse 55, weight 157 pounds.  HEENT:  Normal.  NECK:  Without JVD.  CARDIAC:  S1, S2, regular rate and rhythm.  LUNGS:  Clear to auscultation bilaterally without wheezing, rhonchi, or  rales.  ABDOMEN:  Soft, nontender with normoactive bowel sounds.  EXTREMITIES:  Without edema.  Calves soft, nontender.  SKIN:  Warm and dry.  NEUROLOGIC:  He is alert and oriented x3.  Cranial nerves II-XII are  grossly intact.  Carotids are  without bruits bilaterally.   Electrocardiogram reveals sinus rhythm of the heart of 62, normal axis,  no acute changes.   IMPRESSION:  1. Dizziness/near syncope.  2. Hemachromatosis, followed by Cameron Roman.  3. 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  4. History of minor luminal irregularities by cardiac catheterization      and no obstructive coronary disease, 2006.  5. Good left ventricular function.  6. Mild obstructive pulmonary disease.  7. History of normal arterial Dopplers of the lower extremities.  8. History of right ulnar nerve decompression.  9. History of chronic low back pain.      a.     Status post laminectomy.      b.     History of nerve entrapment syndrome.  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 Madilyn Fireman in      2004.  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  13.Neck surgery.   PLAN:  The patient presents to the office today for annual followup.  Overall, he is doing well but does tell me about a symptom of near  syncope that he has had for quite some time, now.  This has, apparently,  never been worked up, and does not seem to be getting any worse.  From a  cardiac standpoint, we certainly should rule out brady arrhythmia as a  cause.  I have recommended a cardionet event monitor.  Will also get an  echocardiogram.  Will also get a BMET, TSH, and a magnesium level.  Will  see him back in 4 weeks after the above testing is completed.      Tereso Newcomer, PA-C  Electronically Signed      Learta Codding, MD,FACC  Electronically Signed   SW/MedQ  DD: 10/16/2006  DT: 10/16/2006  Job #: [id number]   cc:   Cameron Roman, M.D.  Cameron Roman, M.D.

## 2010-07-27 NOTE — Assessment & Plan Note (Signed)
[facility]                         GASTROENTEROLOGY OFFICE NOTE   DMAURI, ROSENOW                         MRN:          [id number]  DATE:09/22/2006                            DOB:          14-Mar-1951    REFERRING PHYSICIAN:  Dr. Rudi Heap   REASON FOR CONSULTATION:  Epigastric pain, nausea, vomiting.   HISTORY:  This is a 60 year old white male with a history of neck and  back problems for which he is disabled.  He is referred today through  the courtesy of Dr. Christell Constant regarding abdominal discomfort, nausea, and  vomiting.  The patient reports many year history of problems with  belching.  His problems have been going on for greater than 10 years.  However, over the past 6 months, he has noticed epigastric discomfort or  pain associated with fullness followed by belching, nausea and vomiting.  After vomiting, his discomfort improves.  He also states he has lost  approximately 20 pounds over the past 3 months.  There has been no  hematemesis, melena, or hematochezia.  His bowel habits are regular.  He  does have a family history of colon cancer in his father, though has not  undergone screening.  He denies dysphagia.  No true heartburn, though he  does have regurgitation.  He rarely uses Goody Powders for an occasional  headache.  No other nonsteroidal anti-inflammatory drugs.  The patient  was seen in Dr. Kathi Der office and placed on Prevacid 30 mg daily.  Since that time, he states his symptoms have improved.  He does continue  to belch, however.   PAST MEDICAL HISTORY:  None.   PAST SURGICAL HISTORY:  1. Status post neck surgery.  2. Status post low back surgery.  3. Status post appendectomy.   ALLERGIES:  No known drug allergies.   CURRENT MEDICATIONS:  1. Amitriptyline at night.  2. Prevacid 30 mg daily, started 1 week ago.  3. Vicodin p.r.n.   FAMILY HISTORY:  A father with colon cancer diagnosed in his 42s.  Uncle  with heart  disease and alcoholism.   SOCIAL HISTORY:  The patient is married with 2 children.  He has an 8th  grade education, lives with his wife, Britta Mccreedy, who is a patient of mine.  He is disabled due to his back problems as well as hand problems.  He  does not smoke and occasionally uses alcohol.   REVIEW OF SYSTEMS:  Per diagnostic evaluation.   PHYSICAL EXAMINATION:  A well-appearing male in no acute distress.  Blood pressure 144/80, heart rate 98 and regular.  Weight is 151.6  pounds.  He is 5 feel 11 inches in height.  HEENT:  Sclerae anicteric.  Conjunctivae are pink.  Oral mucosa is  intact with no adenopathy.  LUNGS:  Clear.  HEART:  Regular.  ABDOMEN:  Soft without tenderness, mass, or hernia.  Good bowel sounds  heard.  EXTREMITIES:  Without edema.   IMPRESSION:  1. Epigastric pain, nausea, vomiting, and weight loss.  Symptoms      seemingly improved on Prevacid.  Rule out peptic ulcer  disease.      Rule out reflux disease.  Rule out occult malignancy.  2. Family history of colon cancer.  No previous screening.  Rule out      occult colon lesion.   RECOMMENDATIONS:  1. Continue Prevacid.  2. Colonoscopy and upper endoscopy.   The nature of the procedures as well as the risks, benefits, and  alternatives were discussed in detail.  He understood and agreed to  proceed.     Wilhemina Bonito. Marina Goodell, MD  Electronically Signed    JNP/MedQ  DD: 09/22/2006  DT: 09/22/2006  Job #: [id number]   cc:   Ernestina Penna, M.D.

## 2010-07-30 NOTE — Cardiovascular Report (Signed)
Cameron Roman, Cameron Roman                ACCOUNT NO.:  [id number]   MEDICAL RECORD NO.:  [id number]          PATIENT TYPE:  OIB   LOCATION:  6501                         FACILITY:  [facility]   PHYSICIAN:  Arturo Morton. Riley Kill, M.D. [facility] OF BIRTH:  02-21-1951   DATE OF PROCEDURE:  06/03/2004  DATE OF DISCHARGE:                              CARDIAC CATHETERIZATION   INDICATIONS:  Progressive shortness of breath.   PROCEDURES:  1.  Right and left heart catheterization.  2.  Selective coronary arteriography.  3.  Selective left ventriculography.  4.  Distal aortography.   DESCRIPTION OF PROCEDURE:  Following informed consent,  the patient was  brought to the catheterization laboratory, prepped and draped in usual  fashion. Through an anterior puncture the femoral vein was entered and a 7-  [place] sheath was placed. The right femoral artery was entered and a 4-  [place] sheath was placed. Following this, right heart catheterization was  performed. Superior vena cava saturation was obtained followed by PA  saturation.  Sequential pressures were obtained in the right heart.  Following this, thermodilution cardiac outputs were performed. Following  this, simultaneous wedge LV were obtained. The right heart catheter was then  pulled back and simultaneous LV RV was obtained. Following this, the  ventriculography was performed in the RAO projection.  Distal aortography  was then performed.  Coronary angiography was performed with standard  Judkins catheters. The patient tolerated procedure well. There were no  complications.   HEMODYNAMIC DATA.:  1.  Right atrial pressure 6.  2.  RV 26/7.  3.  Pulmonary artery 18/11, mean 15.  4.  Pulmonary capillary wedge 8.  5.  LV 137/30.  6.  Aortic 145/91.  7.  No aortic or left ventricular gradient.  8.  No left ventricular pulmonary capillary wedge gradient.  9.  Normal RV LV gradient as predicted.  10. Superior vena cava saturation was 72%.  11. Left  pulmonary artery saturation 80%.  12. Mid aortic saturation 96%.  13. Thermodilution cardiac output was 4.2 liters per minute.  14. Thermodilution cardiac index 2.3 liters per minute per meter squared.  15. Fick cardiac output 5.6 liters per minute.  16. Fick cardiac index 3.06 liters per minute per meter squared.   ANGIOGRAPHIC DATA.:  1.  Ventriculography was formed in the RAO projection. Overall systolic      function was well-preserved. No segmental abnormalities contraction were      seen.  2.  Distal aortogram demonstrates what appeared be patent renal arteries and      smooth aortic infrarenal abdominal aorta without significant renal      artery stenosis.  3.  Left main was free of critical disease.  4.  Left anterior descending artery coursed to the apex. There is minimal      luminal irregularity just after the diagonal takeoff.  5.  The circumflex has some minor luminal irregularity involving the second      marginal branch.  6.  The right coronary artery is a dominant vessel providing posterior      descending and posterolateral system.  All of this appears to be free of      significant disease.   CONCLUSION:  1.  Well-preserved left ventricular function.  2.  No critical coronary obstruction.  3.  Normal right heart pressures.  4.  The current findings do not suggest significant coronary obstruction.      His right heart pressures are clearly normal at the present time. The      patient does have an elevated hemoglobin at 17.5. He may have a      combination of hemochromatosis and/or smoker's polycythemia. Whether or      not this is contributing to his symptoms is unclear but needs to be      evaluated and I suspect he will need phlebotomy. Perhaps hematologic      evaluation would be warranted and this can be done as an outpatient.      TDS/MEDQ  D:  06/03/2004  T:  06/03/2004  Job:  [id number]   cc:   CV Lab   Learta Codding, M.D. [facility] Z[facility] Alpers, P.A[facility]   [facility]  [phone] [place], [place]   Ernestina Penna, M.D.  [address] [place]  [place] [address]  Fax: [phone]

## 2010-07-30 NOTE — H&P (Signed)
Cameron Roman, Cameron Roman                            ACCOUNT NO.:  [id number]   MEDICAL RECORD NO.:  [id number]                   PATIENT TYPE:  AMB   LOCATION:  DSC                                  FACILITY:  [facility]   PHYSICIAN:  Artist Pais. Mina Marble, M.D.           DATE OF BIRTH:  Sep 06, 1950   DATE OF ADMISSION:  07/09/2003  DATE OF DISCHARGE:                                HISTORY & PHYSICAL   PREOPERATIVE DIAGNOSIS:  Right wrist ulnar nerve compression.   POSTOPERATIVE DIAGNOSIS:  Right wrist ulnar nerve compression.   PROCEDURE:  Right wrist ulnar nerve decompression.   SURGEON:  Artist Pais. Mina Marble, M.D.   ASSISTANT:  Aura Fey. Bobbe Medico.   ANESTHESIA:  General.   TOURNIQUET TIME:  40 minutes.   COMPLICATIONS:  None.   DRAINS:  None.   DESCRIPTION OF PROCEDURE:  The patient was taken to the operating room and  after the induction of adequate general anesthesia, the right upper  extremity was prepped and draped in the usual sterile fashion.  An Esmarch  was used to exsanguinate the limb.  The tourniquet was inflated to 250 mmHg  at this point in time.  The right wrist was approached through his previous  incision from his vein grafting for an occluded ulnar artery.  Incision was  taken down through the skin and subcutaneous tissue using a Brunner type  fashion across the wrist.  Flaps were raised accordingly and the  neurovascular bundle was identified at the wrist crease.  It was then  dissected down to the level of the Guillain's canal.  The ulnar nerve was  released from the distal forearm into the Guillain's canal until the  superficial deep branches were identified, decompressed completely.  There  were no ganglions or osseous lesions in the canal. The wound was thoroughly  irrigated and then closed with a 5-0 nylon. A sterile dressing for Xeroform,  4x4's, and the lower splint was applied.  The patient tolerated the  procedure well and went to the recovery room in stable  condition.                                                Artist Pais Mina Marble, M.D.    MAW/MEDQ  D:  07/09/2003  T:  07/09/2003  Job:  [id number]

## 2010-07-30 NOTE — Op Note (Signed)
Cameron Roman NO.:  [id number]   MEDICAL RECORD NO.:  [id number]          PATIENT TYPE:  INP   LOCATION:  3019                         FACILITY:  [facility]   PHYSICIAN:  Danae Orleans. Venetia Maxon, M.D.  DATE OF BIRTH:  [birth year]   DATE OF PROCEDURE:  04/26/2005  DATE OF DISCHARGE:                                 OPERATIVE REPORT   PREOPERATIVE DIAGNOSIS:  Herniated lumbar disk with lumbar spondylosis,  degenerative disk disease, stenosis, radiculopathy and low back pain, L3-4  and L4-5 levels.   POSTOPERATIVE DIAGNOSIS:  Herniated lumbar disk with lumbar spondylosis,  degenerative disk disease, stenosis, radiculopathy and low back pain, L3-4  and L4-5 levels.   OPERATION PERFORMED:  Bilateral laminectomies, L3-4 and L4-5 levels,  posterior lumbar interbody fusion with PEEK interbody spacers and  morcellized bone autograft and pedicle screw fixation, L3 through L5 levels  bilaterally along with posterolateral arthrodesis with morcellized bone  autograft and bone graft extender.   SURGEON:  Danae Orleans. Venetia Maxon, M.D.   ASSISTANT:  Payton Doughty, M.D.   ANESTHESIA:  General endotracheal.   ESTIMATED BLOOD LOSS:  200 mL.   COMPLICATIONS:  None.   DISPOSITION:  Recovery.   INDICATIONS FOR PROCEDURE:  Cameron Roman is a 60 year old man with bilateral  lower extremity and low back pain.  He has bilateral spondylosis and  foraminal stenosis with disk degeneration at L4-5 and to a lesser degree L3-  4 levels with spinal stenosis at these levels.  It was elected to take him  to surgery for decompression and fusion at these affected levels.   DESCRIPTION OF PROCEDURE:  Cameron Roman was brought to the operating room.  Following the satisfactory and uncomplicated induction of general  endotracheal anesthesia and placement of intravenous lines, the patient was  placed in a prone position on the [place] table.  All soft tissue and bony  prominences were padded appropriately.   The low back was then prepped and  draped in the usual sterile fashion.  The area of planned incision was  infiltrated with 0.25% Marcaine, 0.5% lidocaine, 1:200,000 epinephrine.  Incision was made in the midline and carried through subcutaneous tissue to  the lumbodorsal fascia which was incised bilaterally.  Subperiosteal  dissection was then performed exposing the L3 and L5 laminae and  subsequently an intraoperative x-ray was obtained which demonstrated marker  probe at the L4 transverse process.  Subsequently further exposure was  performed exposing the L3, L4 and L5 transverse processes bilaterally. A  self-retaining retractor was placed to facilitate exposure.  Hemilaminectomies of L3 and L4 were performed with decompression of the  thecal sac and lateral recess.  Subsequently a diskectomy was performed and  the degenerated disk material was removed.  Thorough diskectomy was  performed bilaterally and after placing trial spacers, 12 mm trial spacers,  the interspaces were felt to be well decompressed and a variety of end plate  preparation tools were used to strip the end plates of residual disk and  cartilaginous material.  Subsequently 12 mm PEEK interbody cage was  selected, packed with morcellized bone  autograft, inserted in the interspace  and countersunk appropriately initially on the right at the L3-4 level and  then on the left at the L3-4 level.  Subsequently a similar sized graft was  placed at L4-5 initially on the right, then subsequently on the left.  After  all the grafts were placed, these were felt to be positioned with a good  amount of tension and subsequently additional bone autograft was placed in  the interspace particularly at the L4-5 level and tamped into position.  Subsequently, intraoperative fluoroscopy demonstrated well positioned,  interbody grafts which were appropriately countersunk and subsequently 6.5 x  50 mm pedicle screws were placed at L3, L4 and  L5 bilaterally and the  position of the screws was confirmed on AP and lateral fluoroscopy.  There  was no evidence of any cut outs and all screws had good positioning.  17 mm  prelordosed rods were then placed, fixated to the screws after  posterolateral arthrodesis was performed with morcellized bone autograft  Retained from the drilling of the laminectomy along with morcellized bone  autograft from laminar facet bone which was ground up and additionally with  bone graft extender which had been reconstituted with marrow rich blood  aspirated from the course of the pedicle screws prior to placing the screws.  Subsequently 17 mm rods were placed and lordosed and locked down in situ.  The wound was irrigated prior to placing the bone graft.  The self-retaining  retractor was removed.  The lumbodorsal fascia was closed with 1 Vicryl  suture.  The subcutaneous tissue were reapproximated with 2-0 Vicryl  interrupted inverted sutures and the skin edges were reapproximated with  interrupted 3-0 Vicryl subcuticular stitch.  The wound was dressed with  benzoin and Steri-Strips, Telfa gauze and tape.  The patient was extubated  in the operating room and taken to the recovery room in stable and  satisfactory condition having tolerated the operation well.  Counts were  correct at the end of the case.      Danae Orleans. Venetia Maxon, M.D.  Electronically Signed     JDS/MEDQ  D:  04/26/2005  T:  04/27/2005  Job:  [id number]

## 2010-07-30 NOTE — Op Note (Signed)
NAMEBERLIE, HATCHEL                            ACCOUNT NO.:  [id number]   MEDICAL RECORD NO.:  [id number]                   PATIENT TYPE:  INP   LOCATION:  NA                                   FACILITY:  [facility]   PHYSICIAN:  Harvie Junior, M.D.                DATE OF BIRTH:  Jul 10, 1950   DATE OF PROCEDURE:  11/04/2002  DATE OF DISCHARGE:                                 OPERATIVE REPORT   PREOPERATIVE DIAGNOSIS:  1. Triangular fibrocartilage tear, right wrist with scapholunate ligament     tear.  2. Volar mass, right wrist.  3. Thrombosed right radial artery.   POSTOPERATIVE DIAGNOSIS:  1. Triangular fibrocartilage tear, right wrist with scapholunate ligament     tear.  2. Volar mass, right wrist.  3. Thrombosed right radial artery.   OPERATION PERFORMED:  1. Arthroscopic evaluation of wrist with debridement of triangular     fibrocartilage tear and scapholunate ligament interosseous tear.  2. Exploration of radial artery with takedown of the outer layers to allow     for recannulization.   SURGEON:  Harvie Junior, M.D.   ASSISTANT:  1. Artist Pais. Mina Marble, M.D.  2. Marshia Ly, P.A.   ANESTHESIA:  General.   INDICATIONS FOR PROCEDURE:  The patient is a 60 year old male with a long  history of having had right wrist pain.  Initially seen with a work related  injury.  Following his initial evaluation, he presented with sort of black  little and ring finger.  Thorough evaluation was undertaken at that time  including angiographic examination of the upper extremity and the patient  was cleared for the vascular surgery service.  Because of continuing  complaints of pain and small volar mass, the patient was taken to the  operating room for wrist arthroscopy and exploration of this mass.   DESCRIPTION OF PROCEDURE:  The patient was taken to the operating room and  after adequate anesthetic, the patient was placed supine on the operating  table.  The right wrist was  prepped and draped in the usual sterile fashion  at this point.  Attention was turned initially to the volar aspect of the  radius where a curved incision was made.  Subcutaneous tissue was dissected  down to the level of the mass.  The mass turned out to be a thrombosed  radial artery.  At this point I put a call in to Dr. Dairl Ponder, a  local hand surgeon, who was kind enough to come over to help [place] evaluate  this intraoperatively.  It was felt at this point that this really was a  thrombosed artery and the two of [place] at that time did a dissection of the  outer layers of the arterial mass, care being taken to modify the radial  artery.  No flow was obtained through that. It was dissected distally down  to the  proximal palmar arch and no flow was seen through this area.  At this  point the wound was copiously irrigated and suctioned dry.  The skin was  closed.  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 At that  point, the patient was placed into the wrist tower and wrist arthroscopy was  undertaken.  At that point it was noted to have significant findings of  scapholunate ligament tear as well as a significant triangular  fibrocartilage tear.  These things were debrided within the wrist joint.  There was also a fair amount of chondral cartilaginous change in the wrist.  All of this was debrided as well as a synovectomy and excellent clean up of  the wrist was achieved.  At that point, the wrist portals were closed with a  single stitch.  A sterile compressive dressing was applied.  The patient was  taken to the recovery room where he was noted to be in satisfactory  condition.  The estimated blood loss for this procedure was none.                                                Harvie Junior, M.D.    Ranae Plumber  D:   12/25/2002  T:  12/25/2002  Job:  [id number]

## 2010-07-30 NOTE — Cardiovascular Report (Signed)
Cameron Roman, Cameron Roman                            ACCOUNT NO.:  [id number]   MEDICAL RECORD NO.:  [id number]                   PATIENT TYPE:  OIB   LOCATION:  2899                                 FACILITY:  [facility]   PHYSICIAN:  Balinda Quails, M.D.                 DATE OF BIRTH:  12-08-1950   DATE OF PROCEDURE:  DATE OF DISCHARGE:  09/13/2002                              CARDIAC CATHETERIZATION   DIAGNOSIS:  Ischemic right fourth and fifth fingers.   PROCEDURES:  1. Arch aortogram.  2. Innominate arteriogram.  3. Right upper extremity runoff arteriogram.   ACCESS:  Right common femoral artery, 5 French sheath.   CONTRAST:  110 ml Visipaque.   CLINICAL NOTE:  Cameron Roman is a 60 year old male with history of heavy  tobacco use who traumatized the ulnar aspect of his right wrist.  Following  this, he has had pain in his wrist and also developed cyanotic discoloration  of this fourth and fifth fingers of his right hand. He is brought to the  cath lab at this time for diagnostic arteriography.   PROCEDURE NOTE:  The patient brought to the cath lab in stable condition.  Placed in supine position. Administered 2 mg of Nubain, 2 mg of Versed  intravenously.  Skin and subcutaneous tissue in the right groin instilled  with 1% Xylocaine.  Needle was easily introduced into the right common  femoral artery.  0.035 Wholey guide wire passed through the needle into the  mid abdominal aorta.  The needle removed and a 5 French sheath advanced over  the guide wire.  The dilator removed and sheath flushed with heparin saline  solution.   Standard pigtail catheter was then advanced over the guide wire into the  aortic arch.  This was positioned in the ascending aorta.  30 degree LAO  projection arch aortogram was obtained.  The innominate artery was widely  patent.  The origin of the left common carotid and left subclavian arteries  were also normal. The right common carotid and right subclavian  arteries  were normal proximally.  The carotid bifurcation was widely patent  bilaterally with no evidence of significant ICA stenosis.  Brisk vertebral  flow was noted with vertebral arising from the subclavian arteries  bilaterally and no evidence of vertebral stenosis.   The pigtail catheter was then exchanged for an H1 catheter.  This was  engaged into the innominate artery origin.  With a 20 degree RAO projection  an innominate arteriogram was performed.  This revealed a widely patent  innominate artery.  The origin of the right subclavian and right common  arteries were normal.  The right vertebral artery origin was widely patent.  There was no evidence of proximal subclavian artery aneurysm.   The guide wire was then advanced out into the origin of the right subclavian  artery and the H1 catheter advanced into  the right subclavian artery origin.  Right lower extremity runoff arteriography was obtained.  The proximal right  subclavian, right axillary artery were normal.  There was normal branching  from the proximal brachial artery with a proximal large circumflex humerus  branch noted.  The brachial artery was widely patent to the antecubital  fossa level.  In the antecubital fossa there was normal branching of the  brachial artery into a large radial branch.  There was a common trunk of the  ulnar and interosseous branches.  Dominant runoff was the right radial  artery.  There was normal flow to the level of the carpal bones.  The right  radial artery was dominant flow into the hand.  There was an incomplete  palmar arch.  The right radial artery provided dominant flow to the thumb,  first, second and third fingers.  The ulnar artery provided dominant flow to  the fourth and fifth fingers.  The palmar arch was noted to be incomplete.  The digital arteries revealed small vessel disease with occlusion of the  medial digital artery of the fourth and fifth fingers.   This completed  the arteriogram procedure.  The patient tolerated the  procedure well.  No apparent complications.   The guide wire was reinserted and the H1 catheter removed.  Right femoral  sheath removed.   FINAL IMPRESSION:  1. Normal aortic arch and brachiocephalic vessels.  2. No evidence of subclavian artery aneurysm.  3. Patent right subclavian, axillary and brachial arteries.  4. Dominant runoff to the right radial artery.  5. Incomplete palmar arch in the right hand.  6. 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   DISPOSITION:  These results have been reviewed with the patient and his  wife.  It is strongly recommended the patient discontinue the use of tobacco  products.  It is also recommended that he continue to take aspirin 81 mg  daily.  No further intervention planned at this time.                                                  Balinda Quails, M.D.    PGH/MEDQ  D:  10/04/2002  T:  10/05/2002  Job:  [id number]  [facility]   Cameron Roman, M.D.  [address]  [place]  [place] [address]  Fax: [phone]   cc:   Cameron Roman [facility]   Cameron Roman, M.D.  [address]  [place]  [place] [address]  Fax: [phone]

## 2010-07-30 NOTE — Op Note (Signed)
NAMEANYELO, MCCUE NO.:  [id number]   MEDICAL RECORD NO.:  [id number]          PATIENT TYPE:  INP   LOCATION:  3009                         FACILITY:  [facility]   PHYSICIAN:  Danae Orleans. Venetia Maxon, M.D.  DATE OF BIRTH:  1950/09/29   DATE OF PROCEDURE:  12/05/2003  DATE OF DISCHARGE:  12/06/2003                                 OPERATIVE REPORT   PREOPERATIVE DIAGNOSIS:  Herniated cervical disk with cervical spondylosis,  degenerative disk disease, and cervical radiculopathy, C4-5, C5-6, and C6-7  levels.   POSTOPERATIVE DIAGNOSIS:  Herniated cervical disk with cervical spondylosis,  degenerative disk disease, and cervical radiculopathy, C4-5, C5-6, and C6-7  levels.   PROCEDURE:  Anterior cervical decompression and fusion, C4-5, C5-6, and C6-7  levels, with allograft bone graft and anterior cervical plate.   SURGEON:  Danae Orleans. Venetia Maxon, M.D.   ASSISTANT:  Clydene Fake, M.D.   ANESTHESIA:  General endotracheal anesthesia.   ESTIMATED BLOOD LOSS:  50 mL.   COMPLICATIONS:  None.   DISPOSITION:  To recovery.   INDICATIONS:  Cameron Roman is a 60 year old man who has had chronic problems  with his right upper extremity, for which he has had multiple surgeries.  He  continued to complain of neck and right upper extremity pain.  It was  elected to perform an MRI of his cervical spine, which demonstrates marked  spondylitic degenerative disk disease and biforaminal compression at C4-5,  C5-6, and C6-7 levels.  It was elected to take him to surgery for anterior  cervical decompression and fusion at these affected levels.   PROCEDURE:  Cameron Roman was brought to the operating room.  Following the  satisfactory and uncomplicated induction of general endotracheal anesthesia  and placement of intravenous lines, the patient was placed in the supine  position on the operating table.  His neck was placed in slight extension,  and he was placed in 10 pounds of Holter  traction.  His anterior neck was  then prepped and draped in the usual sterile fashion.  The area of planned  incision was infiltrated with 0.25% Marcaine and 0.5% lidocaine and  1:200,000 epinephrine.  Incision was made from the midline to the anterior  border of the sternocleidomastoid muscle, carried through the platysma layer  sharply.  Blunt dissection was then performed, keeping the carotid sheath  lateral and the trachea and esophagus medial, exposing the anterior cervical  spine.  A bent spinal needle was placed at what was felt to be the C5-6  level, and this was confirmed on intraoperative x-ray.  Subsequently the  longus colli muscles were taken down from the anterior spine from C4 to C7  levels, and using electrocautery and Key elevator, the [place] retractor  was used along with up-down retractors.  The disk spaces at C4-5, C5-6, and  C6-7 levels were the incised with a 15 blade and disk material was removed  in piecemeal fashion.  Starting at the C6-7 level, the microscope was  brought into the field and using high-speed drill, the end plates were  decorticated and uncinate spurs  drilled down.  The degenerated posterior  longitudinal ligament was then incised and removed in piecemeal fashion with  the arachnoid knife and gold-tipped Kerrison rongeurs.  Both C7 nerve roots  were decompressed as well as the central spinal cord dura.  Hemostasis was  assured with Gelfoam soaked in thrombin.  After using a trial sizer, a 7 mm  machined cortical bone graft was packed with the morcellized bone autograft  taken from the drillings of the end plates, and this was then placed in the  interspace and counter sunk appropriately.  Attention was then turned to the  C4-5 level, where similar decompression was performed and a similarly-sized  bone graft was placed.  Attention was then turned to the C5-6 level, where  large uncinate spurs were drilled down and plates were decorticated and   significant nerve root compression was relieved.  The C6 nerve root was  decompressed widely as it extended up the neural foramen, particularly on  the right side, which was the patient's more symptomatic level.  The  hemostasis was then assured and an 8 mm bone graft was packed with  morcellized autograft and placed in the interspace and counter sunk  appropriately.  The 54 mm anterior cervical plate was then affixed to the  anterior cervical spine using variable-angled 14 mm screws, two at C4, two  at C5, two at C6, and two at C7 levels.  All screws had excellent purchase  and locking mechanism was engaged after final x-ray confirmed positioning of  bone grafts and anterior cervical plate.  The patient was taken out of  Holter traction prior to placing the plate.  The wound was then inspected,  soft tissues were in good repair, hemostasis was assured.  The platysmal  layer was reapproximated with 3-0 Vicryl interrupted inverted sutures, and  the skin was reapproximated with interrupted 3-0 Vicryl subcuticular stitch.  The wound was dressed with Dermabond.  The patient was extubated in the  operating room, taken to the recovery room in stable and satisfactory  condition, having tolerated his operation with counts correct at the end of  the case.       JDS/MEDQ  D:  12/05/2003  T:  12/06/2003  Job:  [id number]

## 2010-07-30 NOTE — Discharge Summary (Signed)
[name], [name] NO.:  [id number]   MEDICAL RECORD NO.:  [id number]          PATIENT TYPE:  INP   LOCATION:  3019                         FACILITY:  [facility]   PHYSICIAN:  Danae Orleans. Venetia Maxon, M.D.  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   HOSPITAL COURSE:  Cameron Roman is a 60 year old man with low back and  bilateral lower extremity pain.  He has significant lumbar disc herniations  and bilateral foraminal stenosis with disc degeneration at the L3-L4 and L4-  L5 levels.  It was elected for him to undergo decompression and fusion at  these affected levels.  He was admitted to the hospital on the same day as  procedure basis and underwent uncomplicated surgical decompression and  fusion at the L3 through L5 levels.  Postoperatively, he was doing well, had  less leg pain than preoperatively.  He was mobilized in his brace, had  physical therapy, was doing well and was discharged home on the 16th in  stable and satisfactory condition having tolerated his operation and  hospitalization well.   DISCHARGE MEDICATIONS:  Percocet and Valium for pain and muscular spasm.   DISCHARGE INSTRUCTIONS:  Follow up in three weeks in the office with Dr.  Venetia Maxon.      Danae Orleans. Venetia Maxon, M.D.  Electronically Signed     JDS/MEDQ  D:  06/07/2005  T:  06/08/2005  Job:  [id number]

## 2010-07-30 NOTE — Op Note (Signed)
Cameron Roman, Cameron Roman                            ACCOUNT NO.:  [id number]   MEDICAL RECORD NO.:  [id number]                   PATIENT TYPE:  OIB   LOCATION:  2899                                 FACILITY:  [facility]   PHYSICIAN:  Harvie Junior, M.D.                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   OPERATION PERFORMED:  1. Ulnar artery interpositional vein graft.  2. Ulnar nerve neurolysis at the wrist.  3. Harvesting of a vein for interpositional vein graft.   SURGEON:  1. Harvie Junior, M.D.  2. Artist Pais. Mina Marble, M.D.   ANESTHESIA:  General.   INDICATIONS FOR PROCEDURE:  He is a 60 year old male with a long history of  having a painful right wrist and hand.  He presented with a painful wrist  and during work-up, developed decreased circulation to his little and ring  finger.  Arteriography at that point revealed that there was decent  vasculature distally and no procedures were performed.  Following this, the  patient did resolved somewhat with observation, he then had repeat episodes  of painful ring and little finger.  He ultimately had a repeat arteriogram  done which showed that he had a clotted ulnar artery at the level of the  wrist and because of continuing complaints of pain on this side of the hand  as well as decreased blood flow.  The patient was taken to the operating  room for interpositional vein graft and neurolysis of the ulnar nerve.   DESCRIPTION OF PROCEDURE:  The patient was brought to the operating room and  after adequate anesthesia was obtained with a general anesthetic, the  patient was placed supine on the operating table.  The right arm was then  prepped and  draped in the usual sterile fashion.  Following Esmarch  exsanguination of the extremity, blood pressure tourniquet was inflated to  250 mmHg.  Following this, an incision was made to expose the ulnar artery.  The ulnar artery was dissected from approximately 7 cm proximal to the wrist  down into the distal wrist.  The superficial and deep arches were  identified.  There was tortuous flow from about 5 cm proximal to just  proximal to the take off of the superficial and deep ulnar arch.  The  tourniquet was let down at this point. There was clearly no flow in this  tortuous segment of the ulnar artery.  At this point attention was turned  proximally where the cephalic vein was identified and a section of the  cephalic vein was resected over about a 5 cm area proximally.  This wound  was irrigated and closed.  Hemoclips were used to stop the bleeding in this  wound.  The wound was closed with a combination of 3-0 Vicryl and 4-0 nylon.  Following this, attention was turned back to the wrist wound where the ulnar  artery was resected.  The ulnar nerve at this point was dissected and  neurolysed down into the Guillain's canal and made sure that this was freed  up.  Attention was turned back to the ulnar artery where Hemoclips were put  in place proximally and the artery was resected.  The vein which had  previously been harvested was then reversed and anastomoses were undertaken  distally under the use of a microscope 10x magnification.  The distal  anastomosis was performed followed by the proximal anastomosis.  At this  point the tourniquet was let down.  There was minimal bleeding from the  proximal and distal anastomoses.  One stitch was placed in each of the  anastomoses at an area of flow.  At this point the wound was copiously  irrigated.  It was elevated for five minutes with mild pressure and the  wound was then exposed, irrigated.  There was no evidence with the  tourniquet let down  that there was any leaking at the anastomosis.  At this  point the wound was copiously irrigated and suctioned dry.  It was closed  with a combination of interrupted and running suture.  A sterile and  compressive dressing was applied and the patient was then taken to the  recovery room where he was noted to be in satisfactory condition.  After the  wounds were closed, a sterile compressive dressing and dorsal plaster was  applied with the wound in slight flexion and the patient was taken to  recovery where he was noted to be in satisfactory condition.  The estimated  blood loss for this procedure was less than 25mL.                                                Harvie Junior, M.D.    Ranae Plumber  D:  01/01/2003  T:  01/02/2003  Job:  [id number]   cc:   Artist Pais Mina Marble, M.D.  [address]  [place]  [place] [address]  Fax: [phone]

## 2011-01-24 ENCOUNTER — Other Ambulatory Visit: Payer: Self-pay | Admitting: Neurosurgery

## 2011-01-24 ENCOUNTER — Other Ambulatory Visit (HOSPITAL_COMMUNITY): Payer: Self-pay | Admitting: Neurosurgery

## 2011-01-24 DIAGNOSIS — M545 Low back pain: Secondary | ICD-10-CM

## 2011-01-24 DIAGNOSIS — M542 Cervicalgia: Secondary | ICD-10-CM

## 2011-01-24 MED ORDER — SODIUM CHLORIDE 0.9 % IV SOLN: 4.0000 mg | Freq: Four times a day (QID) | INTRAVENOUS | Status: DC | PRN

## 2011-01-31 ENCOUNTER — Ambulatory Visit (HOSPITAL_COMMUNITY)
Admission: RE | Admit: 2011-01-31 | Discharge: 2011-01-31 | Disposition: A | Discharge status: Home / Self Care | Payer: Medicare Other | Source: Ambulatory Visit | Attending: Neurosurgery | Admitting: Neurosurgery

## 2011-01-31 DIAGNOSIS — M545 Low back pain, unspecified: Secondary | ICD-10-CM | POA: Insufficient documentation

## 2011-01-31 DIAGNOSIS — M542 Cervicalgia: Secondary | ICD-10-CM | POA: Insufficient documentation

## 2011-01-31 MED ORDER — DIAZEPAM 5 MG PO TABS
10.0000 mg | ORAL_TABLET | Freq: Once | ORAL | Status: AC
  Administered 2011-01-31: 10 mg via ORAL

## 2011-01-31 MED ORDER — IOHEXOL 300 MG/ML  SOLN
10.0000 mL | Freq: Once | INTRAMUSCULAR | Status: AC | PRN
  Administered 2011-01-31: 10 mL via INTRATHECAL

## 2011-01-31 MED ORDER — ONDANSETRON HCL 4 MG/2ML IJ SOLN: 4.0000 mg | Freq: Four times a day (QID) | INTRAMUSCULAR | Status: DC | PRN

## 2011-01-31 MED ORDER — HYDROCODONE-ACETAMINOPHEN 10-325 MG PO TABS
1.0000 | ORAL_TABLET | ORAL | Status: DC | PRN
  Administered 2011-01-31: 1 via ORAL
  Filled 2011-01-31: qty 1

## 2011-01-31 NOTE — Procedures (Signed)
Lumbar puncture at L1/2  . Omnipaque 300.

## 2011-02-11 ENCOUNTER — Ambulatory Visit (INDEPENDENT_AMBULATORY_CARE_PROVIDER_SITE_OTHER): Payer: Medicare Other | Admitting: Surgery

## 2011-02-11 ENCOUNTER — Encounter (INDEPENDENT_AMBULATORY_CARE_PROVIDER_SITE_OTHER): Payer: Self-pay | Admitting: Surgery

## 2011-02-11 VITALS — BP 128/82 | HR 64 | Temp 97.1°F | Resp 16 | Ht 67.0 in | Wt 157.0 lb

## 2011-02-11 DIAGNOSIS — D171 Benign lipomatous neoplasm of skin and subcutaneous tissue of trunk: Secondary | ICD-10-CM | POA: Insufficient documentation

## 2011-02-11 DIAGNOSIS — D1739 Benign lipomatous neoplasm of skin and subcutaneous tissue of other sites: Secondary | ICD-10-CM

## 2011-02-11 HISTORY — DX: Benign lipomatous neoplasm of skin and subcutaneous tissue of trunk: D17.1

## 2011-02-11 NOTE — Progress Notes (Signed)
NAME: Cameron Roman                                                                                      DOB: Feb 16, 1951 DATE: 02/11/2011               MRN: [id number]   CC:  Chief Complaint  Patient presents with  . Other    Eval of fatty tumor - right thigh    HPI:  Cameron Roman is a 60 y.o.  male who was referred  by Dr. Preston Fleeting evaluation of a lipoma. About six or eight months ago the patient noticed a small lump on the right anterior thigh. He has also noticed three additional ones in the abdominal area. All are asymptomatic. None seemed to have enlarged. We were asked to see him to see if any of these need to be removed surgically.  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  PSH:   has past surgical history that includes Hand surgery (2005); lower back surgery (2009); and Neck surgery (2009).  ALLERGIES:   Allergies  Allergen Reactions  . Percocet (Oxycodone-Acetaminophen) Other (See Comments)    Hallucinations, inability to sleep.    MEDICATIONS: Current outpatient prescriptions:amitriptyline (ELAVIL) 50 MG tablet, Take 50 mg by mouth at bedtime.  , Disp: , Rfl: ;  HYDROcodone-acetaminophen (LORTAB) 10-500 MG per tablet, Take 1 tablet by mouth every 6 (six) hours as needed. For pain  , Disp: , Rfl: ;  omeprazole (PRILOSEC) 20 MG capsule, Take 20 mg by mouth daily.  , Disp: , Rfl:   ROS: Our 12 point review of systems was filled out by the patient and his negative except for mild hearing loss. He does have arthritis, diabetes, and GERD.  EXAM:   GENERAL:  The patient is alert, oriented, and generally healthy-appearing, NAD. Mood and affect are normal.   ABDOMEN:  Soft, flat, and nontender. No masses or organomegaly is noted. No hernias are noted. Bowel sounds are normal.He has three sub q masses, right costal  Margin, right upper quadrant and left upper  quadrant. They are subtle, about 1cm, soft, mobile and non-tender    EXTREMITIES:  Good range of motion, no edema. Soft mass right anterior thigh, 1.5cm, mobile and not tender  DATA REVIEWED:  Notes from his primary care  IMPRESSION:  Multiple lipomas  PLAN:   I do not believe these need excision They are clinically benign and asymptomatic  Hennesy Sobalvarro J 02/11/2011  Copy to : Caryn Bee

## 2011-02-11 NOTE — Patient Instructions (Signed)
If there is any change in the small lipomas - they enlarge or cause symptoms come back to see me

## 2011-02-25 ENCOUNTER — Telehealth: Payer: Self-pay

## 2011-02-25 ENCOUNTER — Other Ambulatory Visit: Payer: Self-pay

## 2011-02-25 MED ORDER — OMEPRAZOLE 20 MG PO CPDR: 20.0000 mg | DELAYED_RELEASE_CAPSULE | Freq: Every day | ORAL | Status: DC

## 2011-02-25 NOTE — Telephone Encounter (Signed)
refilled omeprazole with 3 refills

## 2011-04-01 ENCOUNTER — Other Ambulatory Visit: Payer: Self-pay | Admitting: Neurosurgery

## 2011-04-06 ENCOUNTER — Encounter (HOSPITAL_COMMUNITY): Payer: Self-pay | Admitting: Pharmacy Technician

## 2011-04-08 ENCOUNTER — Encounter (HOSPITAL_COMMUNITY)
Admission: RE | Admit: 2011-04-08 | Discharge: 2011-04-08 | Disposition: A | Discharge status: Home / Self Care | Payer: Medicare Other | Source: Ambulatory Visit | Attending: Neurosurgery | Admitting: Neurosurgery

## 2011-04-08 ENCOUNTER — Encounter (HOSPITAL_COMMUNITY): Payer: Self-pay

## 2011-04-08 HISTORY — DX: Personal history of colonic polyps: Z86.010

## 2011-04-08 HISTORY — DX: Cough: R05

## 2011-04-08 HISTORY — DX: Personal history of colon polyps, unspecified: Z86.0100

## 2011-04-08 HISTORY — DX: Other chronic pain: G89.29

## 2011-04-08 HISTORY — DX: Personal history of other diseases of the digestive system: Z87.19

## 2011-04-08 HISTORY — DX: Cervicalgia: M54.2

## 2011-04-08 HISTORY — DX: Insomnia, unspecified: G47.00

## 2011-04-08 HISTORY — DX: Cough, unspecified: R05.9

## 2011-04-08 HISTORY — DX: Dorsalgia, unspecified: M54.9

## 2011-04-08 LAB — BASIC METABOLIC PANEL
BUN: 6 mg/dL (ref 6–23)
CO2: 27 mEq/L (ref 19–32)
Chloride: 104 mEq/L (ref 96–112)
GFR calc Af Amer: >90 mL/min (ref >90)
Glucose, Bld: 83 mg/dL (ref 70–99)
Potassium: 4.2 mEq/L (ref 3.5–5.1)

## 2011-04-08 LAB — SURGICAL PCR SCREEN: Staphylococcus aureus: NEGATIVE

## 2011-04-08 LAB — CBC
HCT: 43.5 % (ref 39.0–52.0)
Hemoglobin: 15.9 g/dL (ref 13.0–17.0)
WBC: 12.3 10*3/uL — ABNORMAL HIGH (ref 4.0–10.5)

## 2011-04-08 NOTE — Pre-Procedure Instructions (Signed)
20 Dub Maclellan  04/08/2011   Your procedure is scheduled on:  Tues, Jan 29 @ 1045  Report to [facility] at 7:45 AM.  Call this number if you have problems the morning of surgery: [phone]   Remember:   Do not eat food:After Midnight.  May have clear liquids: up to 4 Hours before arrival.(until 3:45 am)  Clear liquids include soda, tea, black coffee, apple or grape juice, broth.  Take these medicines the morning of surgery with A SIP OF WATER: Prilosec and Pain Pill(if needed)   Do not wear jewelry, make-up or nail polish.  Do not wear lotions, powders, or perfumes. You may wear deodorant.  Do not shave 48 hours prior to surgery.  Do not bring valuables to the hospital.  Contacts, dentures or bridgework may not be worn into surgery.  Leave suitcase in the car. After surgery it may be brought to your room.  For patients admitted to the hospital, checkout time is 11:00 AM the day of discharge.   Patients discharged the day of surgery will not be allowed to drive home.  Name and phone number of your driver:   Special Instructions: CHG Shower Use Special Wash: 1/2 bottle night before surgery and 1/2 bottle morning of surgery.   Please read over the following fact sheets that you were given: Pain Booklet, Coughing and Deep Breathing, MRSA Information and Surgical Site Infection Prevention

## 2011-04-08 NOTE — Progress Notes (Signed)
Pt doesn't have a cardiologist  Pt did have an echo and stress test in 05/2004

## 2011-04-11 MED ORDER — CEFAZOLIN SODIUM-DEXTROSE 2-3 GM-% IV SOLR
2.0000 g | INTRAVENOUS | Status: AC
  Administered 2011-04-12: 2 g via INTRAVENOUS
  Filled 2011-04-11: qty 50

## 2011-04-12 ENCOUNTER — Encounter (HOSPITAL_COMMUNITY): Payer: Self-pay | Admitting: Anesthesiology

## 2011-04-12 ENCOUNTER — Ambulatory Visit (HOSPITAL_COMMUNITY): Payer: Medicare Other

## 2011-04-12 ENCOUNTER — Inpatient Hospital Stay (HOSPITAL_COMMUNITY)
Admission: RE | Admit: 2011-04-12 | Discharge: 2011-04-13 | DRG: 472 | Disposition: A | Discharge status: Home / Self Care | Payer: Medicare Other | Source: Ambulatory Visit | Attending: Neurosurgery | Admitting: Neurosurgery

## 2011-04-12 ENCOUNTER — Encounter (HOSPITAL_COMMUNITY)
Admission: RE | Disposition: A | Discharge status: Home / Self Care | Payer: Self-pay | Source: Ambulatory Visit | Attending: Neurosurgery

## 2011-04-12 ENCOUNTER — Encounter (HOSPITAL_COMMUNITY): Payer: Self-pay | Admitting: *Deleted

## 2011-04-12 ENCOUNTER — Ambulatory Visit (HOSPITAL_COMMUNITY): Payer: Medicare Other | Admitting: Anesthesiology

## 2011-04-12 DIAGNOSIS — Z7982 Long term (current) use of aspirin: Secondary | ICD-10-CM

## 2011-04-12 DIAGNOSIS — M4712 Other spondylosis with myelopathy, cervical region: Secondary | ICD-10-CM | POA: Diagnosis present

## 2011-04-12 DIAGNOSIS — Z79899 Other long term (current) drug therapy: Secondary | ICD-10-CM

## 2011-04-12 DIAGNOSIS — M5000 Cervical disc disorder with myelopathy, unspecified cervical region: Principal | ICD-10-CM | POA: Diagnosis present

## 2011-04-12 HISTORY — PX: ANTERIOR CERVICAL DECOMP/DISCECTOMY FUSION: SHX1161

## 2011-04-12 SURGERY — ANTERIOR CERVICAL DECOMPRESSION/DISCECTOMY FUSION 2 LEVELS
Anesthesia: General | Site: Neck | Wound class: Clean

## 2011-04-12 MED ORDER — KCL IN DEXTROSE-NACL 20-5-0.45 MEQ/L-%-% IV SOLN
INTRAVENOUS | Status: DC
  Administered 2011-04-12: 18:00:00 via INTRAVENOUS
  Filled 2011-04-12 (×3): qty 1000

## 2011-04-12 MED ORDER — DIAZEPAM 5 MG PO TABS: 5.0000 mg | ORAL_TABLET | Freq: Four times a day (QID) | ORAL | Status: DC | PRN

## 2011-04-12 MED ORDER — HYDROMORPHONE HCL PF 1 MG/ML IJ SOLN
INTRAMUSCULAR | Status: AC
  Administered 2011-04-12: 0.5 mg via INTRAVENOUS
  Filled 2011-04-12: qty 1

## 2011-04-12 MED ORDER — NEOSTIGMINE METHYLSULFATE 1 MG/ML IJ SOLN
INTRAMUSCULAR | Status: DC | PRN
  Administered 2011-04-12: 4 mg via INTRAVENOUS

## 2011-04-12 MED ORDER — ONDANSETRON HCL 4 MG/2ML IJ SOLN: 4.0000 mg | INTRAMUSCULAR | Status: DC | PRN

## 2011-04-12 MED ORDER — MIDAZOLAM HCL 5 MG/5ML IJ SOLN
INTRAMUSCULAR | Status: DC | PRN
  Administered 2011-04-12: 2 mg via INTRAVENOUS

## 2011-04-12 MED ORDER — MORPHINE SULFATE 4 MG/ML IJ SOLN
1.0000 mg | INTRAMUSCULAR | Status: DC | PRN
  Administered 2011-04-12 – 2011-04-13 (×2): 2 mg via INTRAVENOUS
  Filled 2011-04-12 (×2): qty 1

## 2011-04-12 MED ORDER — ZOLPIDEM TARTRATE 10 MG PO TABS
10.0000 mg | ORAL_TABLET | Freq: Every evening | ORAL | Status: DC | PRN
  Administered 2011-04-12: 10 mg via ORAL
  Filled 2011-04-12: qty 1

## 2011-04-12 MED ORDER — SODIUM CHLORIDE 0.9 % IV SOLN: 250.0000 mL | INTRAVENOUS | Status: DC

## 2011-04-12 MED ORDER — PROPOFOL 10 MG/ML IV EMUL
INTRAVENOUS | Status: DC | PRN
  Administered 2011-04-12: 200 mg via INTRAVENOUS
  Administered 2011-04-12: 50 mg via INTRAVENOUS

## 2011-04-12 MED ORDER — FENTANYL CITRATE 0.05 MG/ML IJ SOLN
INTRAMUSCULAR | Status: DC | PRN
  Administered 2011-04-12 (×2): 50 ug via INTRAVENOUS
  Administered 2011-04-12: 100 ug via INTRAVENOUS
  Administered 2011-04-12 (×5): 50 ug via INTRAVENOUS

## 2011-04-12 MED ORDER — MENTHOL 3 MG MT LOZG: 1.0000 | LOZENGE | OROMUCOSAL | Status: DC | PRN

## 2011-04-12 MED ORDER — MORPHINE SULFATE 2 MG/ML IJ SOLN: 0.0500 mg/kg | INTRAMUSCULAR | Status: DC | PRN

## 2011-04-12 MED ORDER — SODIUM CHLORIDE 0.9 % IJ SOLN: 3.0000 mL | INTRAMUSCULAR | Status: DC | PRN

## 2011-04-12 MED ORDER — ONDANSETRON HCL 4 MG/2ML IJ SOLN: 4.0000 mg | Freq: Once | INTRAMUSCULAR | Status: DC | PRN

## 2011-04-12 MED ORDER — PHENOL 1.4 % MT LIQD: 1.0000 | OROMUCOSAL | Status: DC | PRN

## 2011-04-12 MED ORDER — AMITRIPTYLINE HCL 50 MG PO TABS
50.0000 mg | ORAL_TABLET | Freq: Every day | ORAL | Status: DC
Start: 2011-04-12 — End: 2011-04-13
  Administered 2011-04-12: 50 mg via ORAL
  Filled 2011-04-12 (×2): qty 1

## 2011-04-12 MED ORDER — ACETAMINOPHEN 650 MG RE SUPP: 650.0000 mg | RECTAL | Status: DC | PRN

## 2011-04-12 MED ORDER — ROCURONIUM BROMIDE 100 MG/10ML IV SOLN
INTRAVENOUS | Status: DC | PRN
  Administered 2011-04-12: 50 mg via INTRAVENOUS

## 2011-04-12 MED ORDER — SODIUM CHLORIDE 0.9 % IR SOLN
Status: DC | PRN
  Administered 2011-04-12: 13:00:00

## 2011-04-12 MED ORDER — MEPERIDINE HCL 25 MG/ML IJ SOLN: 6.2500 mg | INTRAMUSCULAR | Status: DC | PRN

## 2011-04-12 MED ORDER — THROMBIN 20000 UNITS EX KIT
PACK | CUTANEOUS | Status: DC | PRN
  Administered 2011-04-12: 13:00:00 via TOPICAL

## 2011-04-12 MED ORDER — MORPHINE SULFATE 4 MG/ML IJ SOLN: 0.0500 mg/kg | INTRAMUSCULAR | Status: DC | PRN

## 2011-04-12 MED ORDER — DEXAMETHASONE SODIUM PHOSPHATE 10 MG/ML IJ SOLN
INTRAMUSCULAR | Status: DC | PRN
Start: 2011-04-12 — End: 2011-04-12
  Administered 2011-04-12: 10 mg via INTRAVENOUS

## 2011-04-12 MED ORDER — CEFAZOLIN SODIUM 1-5 GM-% IV SOLN
1.0000 g | Freq: Three times a day (TID) | INTRAVENOUS | Status: AC
  Administered 2011-04-12 – 2011-04-13 (×2): 1 g via INTRAVENOUS
  Filled 2011-04-12 (×2): qty 50

## 2011-04-12 MED ORDER — DOCUSATE SODIUM 100 MG PO CAPS
100.0000 mg | ORAL_CAPSULE | Freq: Two times a day (BID) | ORAL | Status: DC
  Administered 2011-04-12: 100 mg via ORAL
  Filled 2011-04-12: qty 1

## 2011-04-12 MED ORDER — GLYCOPYRROLATE 0.2 MG/ML IJ SOLN
INTRAMUSCULAR | Status: DC | PRN
  Administered 2011-04-12: .7 mg via INTRAVENOUS

## 2011-04-12 MED ORDER — ONDANSETRON HCL 4 MG/2ML IJ SOLN
INTRAMUSCULAR | Status: DC | PRN
  Administered 2011-04-12: 4 mg via INTRAVENOUS

## 2011-04-12 MED ORDER — 0.9 % SODIUM CHLORIDE (POUR BTL) OPTIME
TOPICAL | Status: DC | PRN
  Administered 2011-04-12: 1000 mL

## 2011-04-12 MED ORDER — LIDOCAINE-EPINEPHRINE 1 %-1:100000 IJ SOLN
INTRAMUSCULAR | Status: DC | PRN
  Administered 2011-04-12: 20 mL

## 2011-04-12 MED ORDER — LACTATED RINGERS IV SOLN
INTRAVENOUS | Status: DC | PRN
  Administered 2011-04-12 (×2): via INTRAVENOUS

## 2011-04-12 MED ORDER — BUPIVACAINE HCL (PF) 0.25 % IJ SOLN
INTRAMUSCULAR | Status: DC | PRN
  Administered 2011-04-12: 30 mL

## 2011-04-12 MED ORDER — KCL IN DEXTROSE-NACL 20-5-0.45 MEQ/L-%-% IV SOLN
INTRAVENOUS | Status: AC
  Filled 2011-04-12: qty 1000

## 2011-04-12 MED ORDER — BACITRACIN 50000 UNITS IM SOLR
INTRAMUSCULAR | Status: AC
  Filled 2011-04-12: qty 1

## 2011-04-12 MED ORDER — SODIUM CHLORIDE 0.9 % IJ SOLN
3.0000 mL | Freq: Two times a day (BID) | INTRAMUSCULAR | Status: DC
  Administered 2011-04-12: 3 mL via INTRAVENOUS

## 2011-04-12 MED ORDER — OXYCODONE-ACETAMINOPHEN 5-325 MG PO TABS: 1.0000 | ORAL_TABLET | ORAL | Status: DC | PRN

## 2011-04-12 MED ORDER — MORPHINE SULFATE 10 MG/ML IJ SOLN
INTRAMUSCULAR | Status: DC | PRN
  Administered 2011-04-12 (×2): 2 mg via INTRAVENOUS

## 2011-04-12 MED ORDER — PANTOPRAZOLE SODIUM 40 MG PO TBEC: 40.0000 mg | DELAYED_RELEASE_TABLET | Freq: Every day | ORAL | Status: DC

## 2011-04-12 MED ORDER — ACETAMINOPHEN 325 MG PO TABS: 650.0000 mg | ORAL_TABLET | ORAL | Status: DC | PRN

## 2011-04-12 MED ORDER — HYDROCODONE-ACETAMINOPHEN 10-325 MG PO TABS
1.0000 | ORAL_TABLET | ORAL | Status: DC | PRN
  Administered 2011-04-12 – 2011-04-13 (×2): 2 via ORAL
  Filled 2011-04-12 (×2): qty 2

## 2011-04-12 MED ORDER — VECURONIUM BROMIDE 10 MG IV SOLR
INTRAVENOUS | Status: DC | PRN
  Administered 2011-04-12 (×2): 1 mg via INTRAVENOUS

## 2011-04-12 MED ORDER — SODIUM CHLORIDE 0.9 % IV SOLN
INTRAVENOUS | Status: AC
  Administered 2011-04-12: 12:00:00 via INTRAVENOUS
  Filled 2011-04-12: qty 500

## 2011-04-12 MED ORDER — HYDROMORPHONE HCL PF 1 MG/ML IJ SOLN
0.2500 mg | INTRAMUSCULAR | Status: DC | PRN
  Administered 2011-04-12: 0.5 mg via INTRAVENOUS

## 2011-04-12 SURGICAL SUPPLY — 78 items
BAG DECANTER FOR FLEXI CONT (MISCELLANEOUS) ×2 IMPLANT
BANDAGE GAUZE ELAST BULKY 4 IN (GAUZE/BANDAGES/DRESSINGS) IMPLANT
BENZOIN TINCTURE PRP APPL 2/3 (GAUZE/BANDAGES/DRESSINGS) ×2 IMPLANT
BIT DRILL 14MM (INSTRUMENTS) ×1 IMPLANT
BIT DRILL NEURO 2X3.1 SFT TUCH (MISCELLANEOUS) ×1 IMPLANT
BLADE SURG 15 STRL LF DISP TIS (BLADE) ×1 IMPLANT
BLADE SURG 15 STRL SS (BLADE) ×1
BLADE ULTRA TIP 2M (BLADE) IMPLANT
BONE CERV LORDOTIC 14.5X12X7 (Bone Implant) ×2 IMPLANT
BONE CERV LORDOTIC 14.5X12X8 (Bone Implant) ×2 IMPLANT
BUR BARREL STRAIGHT FLUTE 4.0 (BURR) ×2 IMPLANT
CANISTER SUCTION 2500CC (MISCELLANEOUS) ×2 IMPLANT
CLOTH BEACON ORANGE TIMEOUT ST (SAFETY) ×2 IMPLANT
CONT SPEC 4OZ CLIKSEAL STRL BL (MISCELLANEOUS) ×4 IMPLANT
COVER MAYO STAND STRL (DRAPES) ×2 IMPLANT
DERMABOND ADVANCED (GAUZE/BANDAGES/DRESSINGS) ×1
DERMABOND ADVANCED .7 DNX12 (GAUZE/BANDAGES/DRESSINGS) ×1 IMPLANT
DRAIN JACKSON PRATT 10MM FLAT (MISCELLANEOUS) ×2 IMPLANT
DRAPE LAPAROTOMY 100X72 PEDS (DRAPES) ×2 IMPLANT
DRAPE MICROSCOPE LEICA (MISCELLANEOUS) ×2 IMPLANT
DRAPE POUCH INSTRU U-SHP 10X18 (DRAPES) ×2 IMPLANT
DRAPE PROXIMA HALF (DRAPES) IMPLANT
DRESSING TELFA 8X3 (GAUZE/BANDAGES/DRESSINGS) IMPLANT
DRILL 14MM (INSTRUMENTS) ×2
DRILL NEURO 2X3.1 SOFT TOUCH (MISCELLANEOUS) ×2
DURAPREP 6ML APPLICATOR 50/CS (WOUND CARE) ×2 IMPLANT
ELECT COATED BLADE 2.86 ST (ELECTRODE) ×2 IMPLANT
ELECT REM PT RETURN 9FT ADLT (ELECTROSURGICAL) ×2
ELECTRODE REM PT RTRN 9FT ADLT (ELECTROSURGICAL) ×1 IMPLANT
EVACUATOR SILICONE 100CC (DRAIN) ×2 IMPLANT
GAUZE SPONGE 4X4 16PLY XRAY LF (GAUZE/BANDAGES/DRESSINGS) IMPLANT
GLOVE BIO SURGEON STRL SZ8 (GLOVE) ×2 IMPLANT
GLOVE BIOGEL M 8.0 STRL (GLOVE) ×2 IMPLANT
GLOVE BIOGEL PI IND STRL 7.5 (GLOVE) ×3 IMPLANT
GLOVE BIOGEL PI IND STRL 8 (GLOVE) ×2 IMPLANT
GLOVE BIOGEL PI IND STRL 8.5 (GLOVE) ×1 IMPLANT
GLOVE BIOGEL PI INDICATOR 7.5 (GLOVE) ×3
GLOVE BIOGEL PI INDICATOR 8 (GLOVE) ×2
GLOVE BIOGEL PI INDICATOR 8.5 (GLOVE) ×1
GLOVE ECLIPSE 7.5 STRL STRAW (GLOVE) ×8 IMPLANT
GLOVE ECLIPSE 8.0 STRL XLNG CF (GLOVE) ×2 IMPLANT
GLOVE EXAM NITRILE LRG STRL (GLOVE) IMPLANT
GLOVE EXAM NITRILE MD LF STRL (GLOVE) IMPLANT
GLOVE EXAM NITRILE XL STR (GLOVE) IMPLANT
GLOVE EXAM NITRILE XS STR PU (GLOVE) IMPLANT
GOWN BRE IMP SLV AUR LG STRL (GOWN DISPOSABLE) ×2 IMPLANT
GOWN BRE IMP SLV AUR XL STRL (GOWN DISPOSABLE) ×4 IMPLANT
GOWN STRL REIN 2XL LVL4 (GOWN DISPOSABLE) ×2 IMPLANT
HEAD HALTER (SOFTGOODS) ×2 IMPLANT
KIT BASIN OR (CUSTOM PROCEDURE TRAY) ×2 IMPLANT
KIT ROOM TURNOVER OR (KITS) ×2 IMPLANT
NEEDLE HYPO 18GX1.5 BLUNT FILL (NEEDLE) ×2 IMPLANT
NEEDLE HYPO 25X1 1.5 SAFETY (NEEDLE) ×2 IMPLANT
NEEDLE SPNL 22GX3.5 QUINCKE BK (NEEDLE) ×2 IMPLANT
NS IRRIG 1000ML POUR BTL (IV SOLUTION) ×2 IMPLANT
PACK LAMINECTOMY NEURO (CUSTOM PROCEDURE TRAY) ×2 IMPLANT
PAD ARMBOARD 7.5X6 YLW CONV (MISCELLANEOUS) ×2 IMPLANT
PIN DISTRACTION 14MM (PIN) ×4 IMPLANT
PLATE 16MM (Plate) ×2 IMPLANT
PLATE 18MM (Plate) ×2 IMPLANT
RUBBERBAND STERILE (MISCELLANEOUS) ×4 IMPLANT
SCREW 12MM (Screw) ×1 IMPLANT
SCREW 14MM (Screw) ×9 IMPLANT
SCREW BN 12X4.5XST VA (Screw) ×1 IMPLANT
SCREW BN 14X4.5XST VA (Screw) ×5 IMPLANT
SPONGE GAUZE 4X4 12PLY (GAUZE/BANDAGES/DRESSINGS) ×2 IMPLANT
SPONGE INTESTINAL PEANUT (DISPOSABLE) ×2 IMPLANT
SPONGE SURGIFOAM ABS GEL 100 (HEMOSTASIS) ×2 IMPLANT
STAPLER SKIN PROX WIDE 3.9 (STAPLE) IMPLANT
STRIP CLOSURE SKIN 1/2X4 (GAUZE/BANDAGES/DRESSINGS) ×2 IMPLANT
SUT VIC AB 3-0 SH 8-18 (SUTURE) ×4 IMPLANT
SYR 20ML ECCENTRIC (SYRINGE) ×2 IMPLANT
SYR 3ML LL SCALE MARK (SYRINGE) ×2 IMPLANT
TAPE CLOTH SURG 4X10 WHT LF (GAUZE/BANDAGES/DRESSINGS) ×2 IMPLANT
TOWEL OR 17X24 6PK STRL BLUE (TOWEL DISPOSABLE) ×2 IMPLANT
TOWEL OR 17X26 10 PK STRL BLUE (TOWEL DISPOSABLE) ×2 IMPLANT
TRAP SPECIMEN MUCOUS 40CC (MISCELLANEOUS) IMPLANT
WATER STERILE IRR 1000ML POUR (IV SOLUTION) ×2 IMPLANT
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
## 2011-04-12 NOTE — Anesthesia Postprocedure Evaluation (Signed)
  Anesthesia Post-op Note  Patient: Cameron Roman  Procedure(s) Performed:  ANTERIOR CERVICAL DECOMPRESSION/DISCECTOMY FUSION 2 LEVELS - exploration of Cervical four - seven  fusion with redo Cervical six- seven, cervical three- four anterior cervical decompression with fusion interbody prothesis plating and bonegraft and C34 anterior cervical decompression with interbody prothesis plating and bonegraft  Patient Location: PACU  Anesthesia Type: General  Level of Consciousness: awake, alert  and oriented  Airway and Oxygen Therapy: Patient Spontanous Breathing and Patient connected to nasal cannula oxygen  Post-op Pain: mild  Post-op Assessment: Post-op Vital signs reviewed, Patient's Cardiovascular Status Stable, Respiratory Function Stable, Patent Airway, No signs of Nausea or vomiting and Pain level controlled  Post-op Vital Signs: Reviewed and stable  Complications: No apparent anesthesia complications

## 2011-04-12 NOTE — Anesthesia Preprocedure Evaluation (Addendum)
Anesthesia Evaluation  Patient identified by MRN, date of birth, ID band Patient awake    Reviewed: Allergy & Precautions, H&P , NPO status , Patient's Chart, lab work & pertinent test results  Airway Mallampati: I TM Distance: >3 FB Neck ROM: Full    Dental  (+) Dental Advisory Given, Edentulous Upper and Edentulous Lower   Pulmonary  clear to auscultation        Cardiovascular Regular Normal    Neuro/Psych  Headaches,    GI/Hepatic hiatal hernia, GERD-  Medicated,  Endo/Other  Diabetes mellitus-, Well Controlled  Renal/GU      Musculoskeletal   Abdominal   Peds  Hematology   Anesthesia Other Findings   Reproductive/Obstetrics                         Anesthesia Physical Anesthesia Plan  ASA: III  Anesthesia Plan: General   Post-op Pain Management:    Induction: Intravenous  Airway Management Planned: Oral ETT  Additional Equipment:   Intra-op Plan:   Post-operative Plan: Extubation in OR  Informed Consent: I have reviewed the patients History and Physical, chart, labs and discussed the procedure including the risks, benefits and alternatives for the proposed anesthesia with the patient or authorized representative who has indicated his/her understanding and acceptance.   Dental advisory given  Plan Discussed with: CRNA, Anesthesiologist and Surgeon  Anesthesia Plan Comments:         Anesthesia Quick Evaluation

## 2011-04-12 NOTE — Preoperative (Signed)
Beta Blockers   Reason not to administer Beta Blockers:Not Applicable 

## 2011-04-12 NOTE — Op Note (Signed)
04/12/2011  2:29 PM  PATIENT:  Cameron Roman  61 y.o. 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, cervical radiculopathy, neck pain  PROCEDURE:  Procedure(s): ANTERIOR CERVICAL DECOMPRESSION/DISCECTOMY FUSION C3-4, Exploration of fusion C4-7, Revision of ACDF C6-7  SURGEON:  Surgeon(s): Dorian Heckle, MD Karn Cassis, MD  PHYSICIAN ASSISTANT:   ASSISTANTS: Poteat, RN   ANESTHESIA:   general  EBL:  Total I/O In: 1000 [I.V.:1000] Out: 100 [Blood:100]  BLOOD ADMINISTERED:none  DRAINS: (10) Jackson-Pratt drain(s) with closed bulb suction in the prevertebral space   LOCAL MEDICATIONS USED:  LIDOCAINE 10CC  SPECIMEN:  No Specimen  DISPOSITION OF SPECIMEN:  N/A  COUNTS:  YES  TOURNIQUET:  * No tourniquets in log *  DICTATION: Patient was brought to operating room and following the smooth and uncomplicated induction of general endotracheal anesthesia his head was placed on a horseshoe head holder he was placed in 5 pounds of Holter traction and his anterior neck was prepped and draped in usual sterile fashion. An incision was made on the left side of midline after infiltrating the skin and subcutaneous tissues with local lidocaine. The platysmal layer was incised and subplatysmal dissection was performed exposing the anterior border sternocleidomastoid muscle. Using blunt dissection the carotid sheath was kept lateral and trachea and esophagus kept medial exposing the anterior cervical spine. Previous plate was exposed and removed from the investing scar tissue and then removed. Longus coli muscles were taken down from the anterior cervical spine using electrocautery and key elevator and self-retaining retractor was placed. The sites of previous fusion were explored and there was seen to be solid arthrodesis  at C4-5 and C5-6 level with clear evidence of pseudarthrosis at the C6-7. Traction pins were placed at C6 and C7 using the previously placed screw holes and with a high-speed drill the pseudoarthrosis material was removed and taken down to normal bleeding bone. The spinal canal was also decompressed. After trial sizing was elected to use 6 mm VG2 bone graft which was supplemented with autograft and packed in the interspace. An 18 mm trestle luxe anterior cervical plate was then affixed to the anterior cervical spine using variable angle 14 mm x 4.5 mm screws, 2 at C6 and 2 at C7. Attention was then turned to the C3-4 level and investing soft tissue was removed the interspace was incised after placing retractor. A thorough discectomy was performed. Distraction pins were placed. Uncinate spurs and central spondylitic ridges were drilled down with a high-speed drill. The spinal cord dura and both C4 nerve roots were widely decompressed. Hemostasis was assured. After trial sizing a 7mm bone graft was selected and packed in the interspace along with autograft. Tamped into position and countersunk appropriately. Distraction weight was removed. A 18 mm trestle luxe anterior cervical plate was affixed to the cervical spine with 14 mm variable-angle screws 2 at C3 and 2 at C4. All screws were well-positioned and locking mechanisms were engaged. Soft tissues were inspected and found to be in good repair. The wound was irrigated. A #10 JP drain was inserted through a separate stab incision in the prevertebral space and anchored with a 3-0 Vicryl stitch. The platysma layer was closed with 3-0 Vicryl stitches and the skin was reapproximated with 3-0 Vicryl subcuticular stitches. The wound was dressed with benzoin and Steri-Strips. Counts were correct at the end of the case. Patient was  extubated and taken to recovery in stable and satisfactory condition.  PLAN OF CARE: Admit to inpatient   PATIENT DISPOSITION:  PACU -  hemodynamically stable.   Delay start of Pharmacological VTE agent (>24hrs) due to surgical blood loss or risk of bleeding: YES

## 2011-04-12 NOTE — Interval H&P Note (Signed)
History and Physical Interval Note:  04/12/2011 7:29 AM  Cameron Roman  has presented today for surgery, with the diagnosis of cervical herniated disc cervical spondylosis cervical radiculopathy  The various methods of treatment have been discussed with the patient and family. After consideration of risks, benefits and other options for treatment, the patient has consented to  Procedure(s): ANTERIOR CERVICAL DECOMPRESSION/DISCECTOMY FUSION 2 LEVELS as a surgical intervention .  The patients' history has been reviewed, patient examined, no change in status, stable for surgery.  I have reviewed the patients' chart and labs.  Questions were answered to the patient's satisfaction.     [name]  Date of Initial H&P: 03/30/2011  History reviewed, patient examined, no change in status, stable for surgery.

## 2011-04-12 NOTE — Transfer of Care (Signed)
Immediate Anesthesia Transfer of Care Note  Patient: Cameron Roman  Procedure(s) Performed:  ANTERIOR CERVICAL DECOMPRESSION/DISCECTOMY FUSION 2 LEVELS - exploration of Cervical four - seven  fusion with redo Cervical six- seven, cervical three- four anterior cervical decompression with fusion interbody prothesis plating and bonegraft and C34 anterior cervical decompression with interbody prothesis plating and bonegraft  Patient Location: PACU  Anesthesia Type: General  Level of Consciousness: awake, alert  and oriented  Airway & Oxygen Therapy: Patient Spontanous Breathing and Patient connected to nasal cannula oxygen  Post-op Assessment: Report given to PACU RN  Post vital signs: Reviewed and stable  Complications: No apparent anesthesia complications

## 2011-04-13 ENCOUNTER — Encounter (HOSPITAL_COMMUNITY): Payer: Self-pay | Admitting: Neurosurgery

## 2011-04-13 LAB — GLUCOSE, CAPILLARY: Glucose-Capillary: 122 mg/dL — ABNORMAL HIGH (ref 70–99)

## 2011-04-13 NOTE — Progress Notes (Signed)
UR COMPLETED  

## 2011-04-13 NOTE — Progress Notes (Signed)
Subjective: Patient reports doing well and much less neck discomfort  Objective: Vital signs in last 24 hours: Temp:  [97.6 F (36.4 C)-99 F (37.2 C)] 98.7 F (37.1 C) (01/30 0600) Pulse Rate:  [72-131] 102  (01/30 0600) Resp:  [11-19] 18  (01/30 0600) BP: (120-159)/(76-96) 159/95 mmHg (01/30 0600) SpO2:  [90 %-96 %] 95 % (01/30 0600) Weight:  [71.215 kg (157 lb)] 71.215 kg (157 lb) (01/30 0500)  Intake/Output from previous day: 01/29 0701 - 01/30 0700 In: 1603 [I.V.:1603] Out: 1045 [Urine:875; Drains:70; Blood:100] Intake/Output this shift:    Physical Exam: Dressing CDI, full strength both arms, all groups  Lab Results: No results found for this basename: WBC:2,HGB:2,HCT:2,PLT:2 in the last 72 hours BMET No results found for this basename: NA:2,K:2,CL:2,CO2:2,GLUCOSE:2,BUN:2,CREATININE:2,CALCIUM:2 in the last 72 hours  Studies/Results: Dg Cervical Spine 1 View  04/12/2011  *RADIOLOGY REPORT*  Clinical Data: Prior cervical spine fusion, redo at C6-7 and ACDF at C3-4.  OPERATIVE CERVICAL SPINE - 1 VIEW 04/12/2011:  Comparison: Cervical CT myelogram 01/31/2011 and cervical spine x- rays 03/05/2010.  Findings: Single lateral image obtained at 1405 hours is submitted for interpretation post-operatively.  Interval hardware removal from C4-C7.  Solid-appearing fusion from C4-C6.  New ACDF with hardware and interbody fusion with plugs at C6-7 and C3-4. Anatomic alignment.  No complicating features.  IMPRESSION: ACDF with hardware at C3-4 and C6-7 with anatomic alignment.  Solid fusion from C4-C6.  Original Report Authenticated By: Arnell Sieving, M.D.    Assessment/Plan: Doing well.  DC drain and DC home    LOS: 1 day    Valentina Alcoser D, MD 04/13/2011, 7:20 AM

## 2011-04-13 NOTE — Progress Notes (Signed)
Clinical Social Worker received consult for "SNF." Pt discharged home with no follow up needs prior to assessment. CSW signing off.   Dede Query, MSW, Theresia Majors [phone]

## 2011-04-13 NOTE — Discharge Summary (Signed)
Physician Discharge Summary  Patient ID: Cameron Roman MRN: [id number] DOB/AGE: 60-27-1952 60 y.o.  Admit date: 04/12/2011 Discharge date: 04/13/2011  Admission Diagnoses:  Discharge Diagnoses:  Active Problems:  * No active hospital problems. *    Discharged Condition: good  Hospital Course: Uncomplicated ACDF C3/4 and C6/7 with exploration of prior fusion C4-7  Consults: None  Significant Diagnostic Studies: None  Treatments: surgery: ACDF C3/4 and C6/7 with exploration of prior fusion C4-7  Discharge Exam: Blood pressure 159/95, pulse 102, temperature 98.7 F (37.1 C), temperature source Oral, resp. rate 18, height 5\' 11"  (1.803 m), weight 71.215 kg (157 lb), SpO2 95.00%. Neurologic: Alert and oriented X 3, normal strength and tone. Normal symmetric reflexes. Normal coordination and gait Incision/Wound:CDI  Disposition:    Medication List  As of 04/13/2011  8:34 AM   ASK your doctor about these medications         amitriptyline 50 MG tablet   Commonly known as: ELAVIL   Take 50 mg by mouth at bedtime.      aspirin EC 325 MG tablet   Take 325 mg by mouth daily as needed. For headache      HYDROcodone-acetaminophen 10-500 MG per tablet   Commonly known as: LORTAB   Take 1 tablet by mouth 2 (two) times daily.      omeprazole 20 MG capsule   Commonly known as: PRILOSEC   Take 20 mg by mouth daily.             Signed: Brexlee Heberlein D 04/13/2011, 8:34 AM

## 2011-06-27 ENCOUNTER — Other Ambulatory Visit: Payer: Self-pay

## 2011-06-27 ENCOUNTER — Telehealth: Payer: Self-pay

## 2011-06-27 MED ORDER — OMEPRAZOLE 20 MG PO CPDR: 20.0000 mg | DELAYED_RELEASE_CAPSULE | Freq: Every day | ORAL | Status: DC

## 2011-06-27 NOTE — Telephone Encounter (Signed)
request for refill of omeprezole; refilled rx

## 2011-07-26 ENCOUNTER — Ambulatory Visit: Payer: Medicare Other | Attending: Neurosurgery | Admitting: Physical Therapy

## 2011-07-26 DIAGNOSIS — R293 Abnormal posture: Secondary | ICD-10-CM | POA: Insufficient documentation

## 2011-07-26 DIAGNOSIS — IMO0001 Reserved for inherently not codable concepts without codable children: Secondary | ICD-10-CM | POA: Insufficient documentation

## 2011-07-26 DIAGNOSIS — M2560 Stiffness of unspecified joint, not elsewhere classified: Secondary | ICD-10-CM | POA: Insufficient documentation

## 2011-07-26 DIAGNOSIS — R5381 Other malaise: Secondary | ICD-10-CM | POA: Insufficient documentation

## 2011-07-26 DIAGNOSIS — M542 Cervicalgia: Secondary | ICD-10-CM | POA: Insufficient documentation

## 2011-07-29 ENCOUNTER — Ambulatory Visit: Payer: Medicare Other | Admitting: Physical Therapy

## 2011-08-02 ENCOUNTER — Ambulatory Visit: Payer: Medicare Other | Admitting: Physical Therapy

## 2011-08-04 ENCOUNTER — Ambulatory Visit: Payer: Medicare Other | Admitting: Physical Therapy

## 2011-08-09 ENCOUNTER — Ambulatory Visit: Payer: Medicare Other | Admitting: *Deleted

## 2011-08-12 ENCOUNTER — Ambulatory Visit: Payer: Medicare Other | Admitting: *Deleted

## 2011-08-16 ENCOUNTER — Ambulatory Visit: Payer: Medicare Other | Attending: Neurosurgery | Admitting: *Deleted

## 2011-08-16 DIAGNOSIS — M542 Cervicalgia: Secondary | ICD-10-CM | POA: Insufficient documentation

## 2011-08-16 DIAGNOSIS — R5381 Other malaise: Secondary | ICD-10-CM | POA: Insufficient documentation

## 2011-08-16 DIAGNOSIS — IMO0001 Reserved for inherently not codable concepts without codable children: Secondary | ICD-10-CM | POA: Insufficient documentation

## 2011-08-16 DIAGNOSIS — R293 Abnormal posture: Secondary | ICD-10-CM | POA: Insufficient documentation

## 2011-08-16 DIAGNOSIS — M2560 Stiffness of unspecified joint, not elsewhere classified: Secondary | ICD-10-CM | POA: Insufficient documentation

## 2011-08-18 ENCOUNTER — Ambulatory Visit: Payer: Medicare Other | Admitting: *Deleted

## 2011-08-23 ENCOUNTER — Ambulatory Visit: Payer: Medicare Other | Admitting: *Deleted

## 2011-08-25 ENCOUNTER — Ambulatory Visit: Payer: Medicare Other | Admitting: *Deleted

## 2011-08-29 ENCOUNTER — Ambulatory Visit: Payer: Medicare Other | Admitting: *Deleted

## 2011-08-31 ENCOUNTER — Ambulatory Visit: Payer: Medicare Other | Admitting: *Deleted

## 2011-09-06 ENCOUNTER — Ambulatory Visit: Payer: Medicare Other | Admitting: *Deleted

## 2011-09-08 ENCOUNTER — Ambulatory Visit: Payer: Medicare Other | Admitting: *Deleted

## 2011-09-12 ENCOUNTER — Ambulatory Visit: Payer: Medicare Other | Attending: Neurosurgery | Admitting: *Deleted

## 2011-09-12 DIAGNOSIS — M542 Cervicalgia: Secondary | ICD-10-CM | POA: Insufficient documentation

## 2011-09-12 DIAGNOSIS — M2560 Stiffness of unspecified joint, not elsewhere classified: Secondary | ICD-10-CM | POA: Insufficient documentation

## 2011-09-12 DIAGNOSIS — IMO0001 Reserved for inherently not codable concepts without codable children: Secondary | ICD-10-CM | POA: Insufficient documentation

## 2011-09-12 DIAGNOSIS — R5381 Other malaise: Secondary | ICD-10-CM | POA: Insufficient documentation

## 2011-09-12 DIAGNOSIS — R293 Abnormal posture: Secondary | ICD-10-CM | POA: Insufficient documentation

## 2011-09-14 ENCOUNTER — Ambulatory Visit: Payer: Medicare Other | Admitting: Physical Therapy

## 2011-10-24 ENCOUNTER — Other Ambulatory Visit: Payer: Self-pay | Admitting: Internal Medicine

## 2011-10-24 NOTE — Telephone Encounter (Signed)
Refilled Omeprazole 

## 2011-11-15 ENCOUNTER — Encounter: Payer: Self-pay | Admitting: Internal Medicine

## 2011-11-30 ENCOUNTER — Other Ambulatory Visit: Payer: Self-pay | Admitting: Internal Medicine

## 2011-12-02 ENCOUNTER — Encounter: Payer: Self-pay | Admitting: Internal Medicine

## 2011-12-08 ENCOUNTER — Ambulatory Visit (AMBULATORY_SURGERY_CENTER): Payer: Medicare Other | Admitting: *Deleted

## 2011-12-08 VITALS — Ht 71.0 in | Wt 155.0 lb

## 2011-12-08 DIAGNOSIS — Z1211 Encounter for screening for malignant neoplasm of colon: Secondary | ICD-10-CM

## 2011-12-08 MED ORDER — MOVIPREP 100 G PO SOLR: ORAL | Status: DC

## 2011-12-22 ENCOUNTER — Encounter: Payer: Self-pay | Admitting: Internal Medicine

## 2011-12-22 ENCOUNTER — Ambulatory Visit (AMBULATORY_SURGERY_CENTER): Payer: Medicare Other | Admitting: Internal Medicine

## 2011-12-22 VITALS — BP 162/97 | HR 68 | Temp 96.7°F | Resp 20 | Ht 71.0 in | Wt 155.0 lb

## 2011-12-22 DIAGNOSIS — D126 Benign neoplasm of colon, unspecified: Secondary | ICD-10-CM

## 2011-12-22 DIAGNOSIS — Z1211 Encounter for screening for malignant neoplasm of colon: Secondary | ICD-10-CM

## 2011-12-22 DIAGNOSIS — Z86010 Personal history of colonic polyps: Secondary | ICD-10-CM

## 2011-12-22 DIAGNOSIS — K5730 Diverticulosis of large intestine without perforation or abscess without bleeding: Secondary | ICD-10-CM

## 2011-12-22 MED ORDER — SODIUM CHLORIDE 0.9 % IV SOLN: 500.0000 mL | INTRAVENOUS | Status: DC

## 2011-12-22 NOTE — Patient Instructions (Addendum)
Findings:  Polyp, Diverticulosis Recommendations:  Repeat colonoscopy in 5 Years.  YOU HAD AN ENDOSCOPIC PROCEDURE TODAY AT THE [facility]: Refer to the procedure report that was given to you for any specific questions about what was found during the examination.  If the procedure report does not answer your questions, please call your gastroenterologist to clarify.  If you requested that your care partner not be given the details of your procedure findings, then the procedure report has been included in a sealed envelope for you to review at your convenience later.  YOU SHOULD EXPECT: Some feelings of bloating in the abdomen. Passage of more gas than usual.  Walking can help get rid of the air that was put into your GI tract during the procedure and reduce the bloating. If you had a lower endoscopy (such as a colonoscopy or flexible sigmoidoscopy) you may notice spotting of blood in your stool or on the toilet paper. If you underwent a bowel prep for your procedure, then you may not have a normal bowel movement for a few days.  DIET: Your first meal following the procedure should be a light meal and then it is ok to progress to your normal diet.  A half-sandwich or bowl of soup is an example of a good first meal.  Heavy or fried foods are harder to digest and may make you feel nauseous or bloated.  Likewise meals heavy in dairy and vegetables can cause extra gas to form and this can also increase the bloating.  Drink plenty of fluids but you should avoid alcoholic beverages for 24 hours.  ACTIVITY: Your care partner should take you home directly after the procedure.  You should plan to take it easy, moving slowly for the rest of the day.  You can resume normal activity the day after the procedure however you should NOT DRIVE or use heavy machinery for 24 hours (because of the sedation medicines used during the test).    SYMPTOMS TO REPORT IMMEDIATELY: A gastroenterologist can be reached at  any hour.  During normal business hours, 8:30 AM to 5:00 PM Monday through Friday, call [phone].  After hours and on weekends, please call the GI answering service at [phone] who will take a message and have the physician on call contact you.   Following lower endoscopy (colonoscopy or flexible sigmoidoscopy):  Excessive amounts of blood in the stool  Significant tenderness or worsening of abdominal pains  Swelling of the abdomen that is new, acute  Fever of 100F or higher  Following upper endoscopy (EGD)  Vomiting of blood or coffee ground material  New chest pain or pain under the shoulder blades  Painful or persistently difficult swallowing  New shortness of breath  Fever of 100F or higher  Black, tarry-looking stools  FOLLOW UP: If any biopsies were taken you will be contacted by phone or by letter within the next 1-3 weeks.  Call your gastroenterologist if you have not heard about the biopsies in 3 weeks.  Our staff will call the home number listed on your records the next business day following your procedure to check on you and address any questions or concerns that you may have at that time regarding the information given to you following your procedure. This is a courtesy call and so if there is no answer at the home number and we have not heard from you through the emergency physician on call, we will assume that you have returned to  your regular daily activities without incident.  SIGNATURES/CONFIDENTIALITY: You and/or your care partner have signed paperwork which will be entered into your electronic medical record.  These signatures attest to the fact that that the information above on your After Visit Summary has been reviewed and is understood.  Full responsibility of the confidentiality of this discharge information lies with you and/or your care-partner.   Please follow all discharge instructions given to you by the recovery room nurse. If you have any  questions or problems after discharge please call one of the numbers listed above. You will receive a phone call in the am to see how you are doing and answer any questions you may have. Thank you for choosing [facility] for your health care needs.

## 2011-12-22 NOTE — Progress Notes (Signed)
Spoke with pt ane wife concerning blood pressure.  Cameron Coy CRNA instructed he should go to his primary MD and have his BP evaluated.  Printed off Vital signs and gave to wife to have to take to primary MD.  Wife states he has appointment for his regular check up and they will talk with MD then.

## 2011-12-22 NOTE — Progress Notes (Signed)
Patient did not experience any of the following events: a burn prior to discharge; a fall within the facility; wrong site/side/patient/procedure/implant event; or a hospital transfer or hospital admission upon discharge from the facility. (G8907) Patient did not have preoperative order for IV antibiotic SSI prophylaxis. (G8918)  

## 2011-12-22 NOTE — Op Note (Signed)
[facility] [address] [place], [address]   COLONOSCOPY PROCEDURE REPORT  PATIENT: Cameron Roman, Cameron Roman  MR#: [id number] BIRTHDATE: March 06, 1951 , 61  yrs. old GENDER: Male ENDOSCOPIST: Roxy Cedar, MD REFERRED XB:[id number] Program Recall PROCEDURE DATE:  12/22/2011 PROCEDURE:   Colonoscopy with snare polypectomy    x 1 ASA CLASS:   Class II INDICATIONS:patient's parent history of colon cancer and patient's personal history of adenomatous colon polyps (2008 - 15mm; 2011 multiple sessile). MEDICATIONS: MAC sedation, administered by CRNA and propofol (Diprivan) 450mg  IV  DESCRIPTION OF PROCEDURE:   After the risks benefits and alternatives of the procedure were thoroughly explained, informed consent was obtained.  A digital rectal exam revealed no abnormalities of the rectum.   The LB [id number]  endoscope was introduced through the anus and advanced to the cecum, which was identified by both the appendix and ileocecal valve. No adverse events experienced.   The quality of the prep was excellent, using MoviPrep  The instrument was then slowly withdrawn as the colon was fully examined.      COLON FINDINGS: A pedunculated polyp measuring 5 mm in size was found in the sigmoid colon.  A polypectomy was performed with a cold snare.  The resection was complete and the polyp tissue was completely retrieved.   Moderate diverticulosis was noted in the sigmoid colon.   The colon mucosa was otherwise normal. Retroflexed views revealed internal hemorrhoids. The time to cecum=5 minutes 15 seconds.  Withdrawal time=11 minutes 40 seconds. The scope was withdrawn and the procedure completed.  COMPLICATIONS: There were no complications.  ENDOSCOPIC IMPRESSION: 1.   Pedunculated polyp measuring 5 mm in size was found in the sigmoid colon; polypectomy was performed with a cold snare 2.   Moderate diverticulosis was noted in the sigmoid colon 3.   The colon mucosa  was otherwise normal  RECOMMENDATIONS: 1. Follow up colonoscopy in 5 years   eSigned:  Roxy Cedar, MD 12/22/2011 11:16 AM cc: Rudi Heap, MD and The Patient   PATIENT NAME:  [name], [name] MR#: [id number]

## 2011-12-23 ENCOUNTER — Telehealth: Payer: Self-pay | Admitting: *Deleted

## 2011-12-23 NOTE — Telephone Encounter (Signed)
  Follow up Call-  Call back number 12/22/2011  Post procedure Call Back phone  # [phone]  Permission to leave phone message Yes     Patient questions:  Do you have a fever, pain , or abdominal swelling? no Pain Score  0 *  Have you tolerated food without any problems? yes  Have you been able to return to your normal activities? yes  Do you have any questions about your discharge instructions: Diet   yes Medications  yes Follow up visit  yes  Do you have questions or concerns about your Care? yes  Actions: * If pain score is 4 or above: No action needed, pain <4.

## 2011-12-28 ENCOUNTER — Encounter: Payer: Self-pay | Admitting: Internal Medicine

## 2012-02-20 ENCOUNTER — Other Ambulatory Visit: Payer: Self-pay | Admitting: Internal Medicine

## 2012-05-21 ENCOUNTER — Other Ambulatory Visit: Payer: Self-pay | Admitting: Internal Medicine

## 2012-06-20 ENCOUNTER — Other Ambulatory Visit: Payer: Self-pay | Admitting: Internal Medicine

## 2012-06-28 ENCOUNTER — Ambulatory Visit (INDEPENDENT_AMBULATORY_CARE_PROVIDER_SITE_OTHER): Payer: Medicare Other

## 2012-06-28 ENCOUNTER — Encounter: Payer: Self-pay | Admitting: Family Medicine

## 2012-06-28 ENCOUNTER — Ambulatory Visit (INDEPENDENT_AMBULATORY_CARE_PROVIDER_SITE_OTHER): Payer: Medicare Other | Admitting: Family Medicine

## 2012-06-28 VITALS — BP 138/75 | HR 75 | Temp 98.0°F | Ht 70.0 in | Wt 162.4 lb

## 2012-06-28 DIAGNOSIS — R61 Generalized hyperhidrosis: Secondary | ICD-10-CM

## 2012-06-28 DIAGNOSIS — M19049 Primary osteoarthritis, unspecified hand: Secondary | ICD-10-CM

## 2012-06-28 MED ORDER — CELECOXIB 200 MG PO CAPS: 200.0000 mg | ORAL_CAPSULE | Freq: Two times a day (BID) | ORAL | Status: DC

## 2012-06-28 NOTE — Progress Notes (Signed)
Patient ID: Cameron Roman, male   DOB: May 08, 1950, 62 y.o.   MRN: [id number] SUBJECTIVE: HPI: Joints of the hands and fingers swell at times And hurts. He sweats at night. No fever no cough, and no shortness of breath. He is a former smoker.  PMH/PSH: reviewed/updated in Epic  SH/FH: reviewed/updated in Epic  Allergies: reviewed/updated in Epic  Medications: reviewed/updated in Epic  Immunizations: reviewed/updated in Epic  ROS: As above in the HPI. All other systems are stable or negative.  OBJECTIVE: APPEARANCE:  Patient in no acute distress.The patient appeared well nourished and normally developed. Acyanotic. Waist: VITAL SIGNS:BP 138/75  Pulse 75  Temp(Src) 98 F (36.7 C) (Oral)  Ht 5\' 10"  (1.778 m)  Wt 162 lb 6.4 oz (73.664 kg)  BMI 23.3 kg/m2   SKIN: warm and  Dry without overt rashes, tattoos and scars  HEAD and Neck: without JVD, Head and scalp: normal Eyes:No scleral icterus. Fundi normal, eye movements normal. Ears: Auricle normal, canal normal, Tympanic membranes normal, insufflation normal. Nose: normal Throat: normal Neck & thyroid: normal  CHEST & LUNGS: Chest wall: normal Lungs: Clear  CVS: Reveals the PMI to be normally located. Regular rhythm, First and Second Heart sounds are normal,  absence of murmurs, rubs or gallops. Peripheral vasculature: Radial pulses: normal Dorsal pedis pulses: normal Posterior pulses: normal  ABDOMEN:  Appearance: normal Benign,, no organomegaly, no masses, no Abdominal Aortic enlargement. No Guarding , no rebound. No Bruits. Bowel sounds: normal  RECTAL: N/A GU: N/A  EXTREMETIES: nonedematous. Both Femoral and Pedal pulses are normal.  MUSCULOSKELETAL:  Spine: normal Joints: mild soft tissue swelling around the DIP and PIP joints. No heat no redness.no deformity.  NEUROLOGIC: oriented to time,place and person; nonfocal. Strength is normal Sensory is normal Reflexes are normal Cranial Nerves are  normal.  ASSESSMENT: Night sweats - Plan: DG Chest 2 View  Arthritis of hand - Plan: Arthritis Panel  Need to rule out RA. Await labs.  PLAN: Orders Placed This Encounter  Procedures  . DG Chest 2 View    Standing Status: Future     Number of Occurrences: 1     Standing Expiration Date: 08/28/2013    Order Specific Question:  Reason for Exam (SYMPTOM  OR DIAGNOSIS REQUIRED)    Answer:  numbness of hands    Order Specific Question:  Reason for Exam (SYMPTOM  OR DIAGNOSIS REQUIRED)    Answer:  night sweats    Order Specific Question:  Reason for Exam (SYMPTOM  OR DIAGNOSIS REQUIRED)    Answer:  former smoker    Order Specific Question:  Preferred imaging location?    Answer:  Internal  . Arthritis Panel   No results found for this or any previous visit (from the past 24 hour(s)). Meds ordered this encounter  Medications  . celecoxib (CELEBREX) 200 MG capsule    Sig: Take 1 capsule (200 mg total) by mouth 2 (two) times daily.    Dispense:  30 capsule    Refill:  2  samples and a coupon card for celebrex given to alleviate symptoms. Await the arthritis panel. Discussed with patient that he may need to see a Rheumatologist. RTC 2 months.  Vlasta Baskin P. Modesto Charon, M.D.

## 2012-06-29 LAB — ARTHRITIS PANEL
Anti Nuclear Antibody(ANA): NEGATIVE
Rhuematoid fact SerPl-aCnc: 14 IU/mL (ref <=14)
Sed Rate: 6 mm/hr (ref 0–16)
Uric Acid, Serum: 5.9 mg/dL (ref 4.0–6.0)

## 2012-06-29 NOTE — Progress Notes (Signed)
Quick Note:  Call patient. Labs normal. No change in plan. ______ 

## 2012-07-02 NOTE — Progress Notes (Signed)
Patient ID: Cameron Roman, male   DOB: July 06, 1950, 62 y.o.   MRN: [id number] [facility] reading (PRIMARY) by  Dr. Leodis Sias: Preliminary results: CXray: Chronic  Changes. No acute findings in a smoker   With night sweats

## 2012-07-03 ENCOUNTER — Other Ambulatory Visit: Payer: Self-pay | Admitting: Internal Medicine

## 2012-07-20 ENCOUNTER — Other Ambulatory Visit: Payer: Self-pay

## 2012-07-20 MED ORDER — CELECOXIB 200 MG PO CAPS: 200.0000 mg | ORAL_CAPSULE | Freq: Two times a day (BID) | ORAL | Status: DC

## 2012-08-30 ENCOUNTER — Encounter: Payer: Self-pay | Admitting: Family Medicine

## 2012-08-30 ENCOUNTER — Ambulatory Visit (INDEPENDENT_AMBULATORY_CARE_PROVIDER_SITE_OTHER): Payer: Medicare Other

## 2012-08-30 ENCOUNTER — Ambulatory Visit (INDEPENDENT_AMBULATORY_CARE_PROVIDER_SITE_OTHER): Payer: Medicare Other | Admitting: Family Medicine

## 2012-08-30 VITALS — BP 145/91 | HR 84 | Temp 97.9°F | Wt 157.8 lb

## 2012-08-30 DIAGNOSIS — M129 Arthropathy, unspecified: Secondary | ICD-10-CM

## 2012-08-30 DIAGNOSIS — G8929 Other chronic pain: Secondary | ICD-10-CM | POA: Insufficient documentation

## 2012-08-30 DIAGNOSIS — R7309 Other abnormal glucose: Secondary | ICD-10-CM

## 2012-08-30 DIAGNOSIS — M542 Cervicalgia: Secondary | ICD-10-CM

## 2012-08-30 DIAGNOSIS — R7303 Prediabetes: Secondary | ICD-10-CM | POA: Insufficient documentation

## 2012-08-30 DIAGNOSIS — E785 Hyperlipidemia, unspecified: Secondary | ICD-10-CM | POA: Insufficient documentation

## 2012-08-30 DIAGNOSIS — M1990 Unspecified osteoarthritis, unspecified site: Secondary | ICD-10-CM

## 2012-08-30 LAB — COMPLETE METABOLIC PANEL WITH GFR
ALT: 19 U/L (ref 0–53)
AST: 32 U/L (ref 0–37)
Albumin: 3.9 g/dL (ref 3.5–5.2)
Alkaline Phosphatase: 88 U/L (ref 39–117)
BUN: 11 mg/dL (ref 6–23)
CO2: 26 mEq/L (ref 19–32)
Calcium: 9.6 mg/dL (ref 8.4–10.5)
Chloride: 103 mEq/L (ref 96–112)
Creat: 0.8 mg/dL (ref 0.50–1.35)
GFR, Est African American: >89 mL/min
GFR, Est Non African American: >89 mL/min
Glucose, Bld: 67 mg/dL — ABNORMAL LOW (ref 70–99)
Potassium: 5.2 mEq/L (ref 3.5–5.3)
Sodium: 136 mEq/L (ref 135–145)
Total Bilirubin: 0.7 mg/dL (ref 0.3–1.2)
Total Protein: 7.3 g/dL (ref 6.0–8.3)

## 2012-08-30 LAB — TSH: TSH: 2.358 u[IU]/mL (ref 0.350–4.500)

## 2012-08-30 LAB — POCT GLYCOSYLATED HEMOGLOBIN (HGB A1C): Hemoglobin A1C: 5.3

## 2012-08-30 MED ORDER — MELOXICAM 15 MG PO TABS: 15.0000 mg | ORAL_TABLET | Freq: Every day | ORAL | Status: DC

## 2012-08-30 NOTE — Progress Notes (Signed)
Patient ID: Cameron Roman, male   DOB: June 29, 1950, 62 y.o.   MRN: [id number] SUBJECTIVE: CC: Chief Complaint  Patient presents with  . Follow-up    2 month follow up fasting  . Medication Problem    needs something other than celebrex due to cost    HPI: 1. Arthritis of the hands responded very well to the celebrex but with the coupon and the copay he could not afford the Rx and used up the samples. 2. H/o esophageal stricture 3. Patient is here for follow up of hyperlipidemia:  denies Headache;denies Chest Pain;denies weakness;denies Shortness of Breath and orthopnea;denies Visual changes;denies palpitations;denies cough;denies pedal edema;denies symptoms of TIA or stroke;deniesClaudication symptoms. admits to Compliance with medications; denies Problems with medications.  4.chronic neck pains treated by Dr Venetia Maxon.  PMH/PSH: reviewed/updated in Epic  SH/FH: reviewed/updated in Epic  Allergies: reviewed/updated in Epic  Medications: reviewed/updated in Epic  Immunizations: reviewed/updated in Epic  ROS: As above in the HPI. All other systems are stable or negative.  OBJECTIVE: APPEARANCE:  Patient in no acute distress.The patient appeared well nourished and normally developed. Acyanotic. Waist: VITAL SIGNS:BP 145/91  Pulse 84  Temp(Src) 97.9 F (36.6 C) (Oral)  Wt 157 lb 12.8 oz (71.578 kg)  BMI 22.64 kg/m2 WM  SKIN: warm and  Dry without overt rashes, tattoos and scars  HEAD and Neck: without JVD, Head and scalp: normal Eyes:No scleral icterus. Fundi normal, eye movements normal. Ears: Auricle normal, canal normal, Tympanic membranes normal, insufflation normal. Nose: normal Throat: normal Neck & thyroid: normal  CHEST & LUNGS: Chest wall: normal Lungs: Clear  CVS: Reveals the PMI to be normally located. Regular rhythm, First and Second Heart sounds are normal,  absence of murmurs, rubs or gallops. Peripheral vasculature: Radial pulses: normal Dorsal  pedis pulses: normal Posterior pulses: normal  ABDOMEN:  Appearance: normal Benign, no organomegaly, no masses, no Abdominal Aortic enlargement. No Guarding , no rebound. No Bruits. Bowel sounds: normal  RECTAL: N/A GU: N/A  EXTREMETIES: nonedematous. Both Femoral and Pedal pulses are normal.  MUSCULOSKELETAL:  Spine: normal Hands, bilaterally there is a medial deviation of the fingers of both hand and tenderness of the joints of the MC-P and IP joints.  NEUROLOGIC: oriented to time,place and person; nonfocal. Results for orders placed in visit on 08/30/12  POCT GLYCOSYLATED HEMOGLOBIN (HGB A1C)      Result Value Range   Hemoglobin A1C 5.3%      ASSESSMENT: Arthritis - Plan: DG Hand Complete Left, DG Hand Complete Right, Ambulatory referral to Rheumatology, meloxicam (MOBIC) 15 MG tablet  Prediabetes - Plan: POCT glycosylated hemoglobin (Hb A1C), COMPLETE METABOLIC PANEL WITH GFR, TSH, POCT UA - Microalbumin, CANCELED: POCT UA - Microalbumin, CANCELED: POCT UA - Microscopic Only  Neck pain, chronic - treated by Dr Venetia Maxon.  HLD (hyperlipidemia) - Plan: COMPLETE METABOLIC PANEL WITH GFR, NMR Lipoprofile with Lipids, TSH  PLAN: Orders Placed This Encounter  Procedures  . DG Hand Complete Left    Standing Status: Future     Number of Occurrences: 1     Standing Expiration Date: 10/30/2013    Order Specific Question:  Reason for Exam (SYMPTOM  OR DIAGNOSIS REQUIRED)    Answer:  aerthhritis both hands    Order Specific Question:  Preferred imaging location?    Answer:  Internal  . DG Hand Complete Right    Standing Status: Future     Number of Occurrences: 1     Standing Expiration Date: 10/30/2013  Order Specific Question:  Reason for Exam (SYMPTOM  OR DIAGNOSIS REQUIRED)    Answer:  arthritis of hands with medial deviation of fingers.    Order Specific Question:  Preferred imaging location?    Answer:  Internal  . COMPLETE METABOLIC PANEL WITH GFR  . NMR Lipoprofile  with Lipids  . TSH  . Ambulatory referral to Rheumatology    Referral Priority:  Routine    Referral Type:  Consultation    Referral Reason:  Specialty Services Required    Requested Specialty:  Rheumatology    Number of Visits Requested:  1  . POCT glycosylated hemoglobin (Hb A1C)  . POCT UA - Microalbumin    Standing Status: Future     Number of Occurrences:      Standing Expiration Date: 12/04/2012   [facility] reading (PRIMARY) by  Dr.Bathsheba Durrett: : no acute findings of the hands.  Meds ordered this encounter  Medications  . meloxicam (MOBIC) 15 MG tablet    Sig: Take 1 tablet (15 mg total) by mouth daily.    Dispense:  30 tablet    Refill:  1                               Return in about 3 months (around 11/30/2012) for Recheck medical problems.  Clevester Helzer P. Modesto Charon, M.D.

## 2012-08-31 LAB — NMR LIPOPROFILE WITH LIPIDS
Cholesterol, Total: 127 mg/dL (ref <200)
HDL Particle Number: 30.2 umol/L — ABNORMAL LOW (ref >=30.5)
HDL Size: 9.2 nm (ref >=9.2)
HDL-C: 39 mg/dL — ABNORMAL LOW (ref >=40)
LDL (calc): 55 mg/dL (ref <100)
LDL Particle Number: 697 nmol/L (ref <1000)
LDL Size: 19.6 nm — ABNORMAL LOW (ref >20.5)
LP-IR Score: 57 — ABNORMAL HIGH (ref <=45)
Large HDL-P: 5 umol/L (ref >=4.8)
Large VLDL-P: 7.5 nmol/L — ABNORMAL HIGH (ref <=2.7)
Small LDL Particle Number: 473 nmol/L (ref <=527)
Triglycerides: 166 mg/dL — ABNORMAL HIGH (ref <150)
VLDL Size: 47.8 nm — ABNORMAL HIGH (ref <=46.6)

## 2012-10-29 ENCOUNTER — Other Ambulatory Visit: Payer: Self-pay | Admitting: Family Medicine

## 2012-11-26 ENCOUNTER — Other Ambulatory Visit: Payer: Self-pay | Admitting: Internal Medicine

## 2012-12-03 ENCOUNTER — Telehealth: Payer: Self-pay | Admitting: Family Medicine

## 2012-12-03 NOTE — Telephone Encounter (Signed)
Pt requesting omeprazole 20mg  script.

## 2012-12-04 ENCOUNTER — Other Ambulatory Visit: Payer: Self-pay | Admitting: Family Medicine

## 2012-12-04 ENCOUNTER — Encounter: Payer: Self-pay | Admitting: Internal Medicine

## 2012-12-04 ENCOUNTER — Telehealth: Payer: Self-pay | Admitting: Internal Medicine

## 2012-12-04 MED ORDER — OMEPRAZOLE 20 MG PO CPDR: 20.0000 mg | DELAYED_RELEASE_CAPSULE | Freq: Every day | ORAL | Status: DC

## 2012-12-04 NOTE — Telephone Encounter (Signed)
No to refill on the Rx written by Dr  Marina Goodell. He needed to follow up with GI for his issues and the note is on the Rx that he needs to be seen by GI befor erefills. On going use of this Rx dos have risks, associated with polyps in the stomach and visual changes are possible. Cameron Roman, M.D.

## 2012-12-04 NOTE — Telephone Encounter (Signed)
Refilled Prilosec

## 2012-12-04 NOTE — Telephone Encounter (Signed)
Pt notified he needs to check with Dr Marina Goodell for refills on Omeprazole Verbalizes understanding

## 2012-12-07 ENCOUNTER — Encounter: Payer: Self-pay | Admitting: *Deleted

## 2012-12-11 ENCOUNTER — Encounter: Payer: Self-pay | Admitting: Family Medicine

## 2012-12-11 ENCOUNTER — Ambulatory Visit (INDEPENDENT_AMBULATORY_CARE_PROVIDER_SITE_OTHER): Payer: Medicare Other | Admitting: Family Medicine

## 2012-12-11 ENCOUNTER — Ambulatory Visit (INDEPENDENT_AMBULATORY_CARE_PROVIDER_SITE_OTHER): Payer: Medicare Other

## 2012-12-11 VITALS — BP 121/79 | HR 81 | Temp 97.2°F | Ht 71.0 in | Wt 158.2 lb

## 2012-12-11 DIAGNOSIS — R05 Cough: Secondary | ICD-10-CM

## 2012-12-11 DIAGNOSIS — M542 Cervicalgia: Secondary | ICD-10-CM

## 2012-12-11 DIAGNOSIS — R059 Cough, unspecified: Secondary | ICD-10-CM

## 2012-12-11 DIAGNOSIS — K449 Diaphragmatic hernia without obstruction or gangrene: Secondary | ICD-10-CM

## 2012-12-11 DIAGNOSIS — E785 Hyperlipidemia, unspecified: Secondary | ICD-10-CM

## 2012-12-11 DIAGNOSIS — R739 Hyperglycemia, unspecified: Secondary | ICD-10-CM

## 2012-12-11 DIAGNOSIS — M129 Arthropathy, unspecified: Secondary | ICD-10-CM

## 2012-12-11 DIAGNOSIS — Z23 Encounter for immunization: Secondary | ICD-10-CM

## 2012-12-11 DIAGNOSIS — K222 Esophageal obstruction: Secondary | ICD-10-CM

## 2012-12-11 DIAGNOSIS — R7303 Prediabetes: Secondary | ICD-10-CM

## 2012-12-11 DIAGNOSIS — G8929 Other chronic pain: Secondary | ICD-10-CM

## 2012-12-11 DIAGNOSIS — M1990 Unspecified osteoarthritis, unspecified site: Secondary | ICD-10-CM

## 2012-12-11 DIAGNOSIS — Z87891 Personal history of nicotine dependence: Secondary | ICD-10-CM

## 2012-12-11 DIAGNOSIS — R7309 Other abnormal glucose: Secondary | ICD-10-CM

## 2012-12-11 DIAGNOSIS — K219 Gastro-esophageal reflux disease without esophagitis: Secondary | ICD-10-CM

## 2012-12-11 LAB — POCT GLYCOSYLATED HEMOGLOBIN (HGB A1C): Hemoglobin A1C: 5.1

## 2012-12-11 MED ORDER — MELOXICAM 15 MG PO TABS: ORAL_TABLET | ORAL | Status: DC

## 2012-12-11 MED ORDER — OMEPRAZOLE 20 MG PO CPDR
20.0000 mg | DELAYED_RELEASE_CAPSULE | Freq: Every day | ORAL | Status: DC
Start: 2012-12-11 — End: 2013-01-14

## 2012-12-11 NOTE — Progress Notes (Signed)
Patient ID: Cameron Roman, male   DOB: 06-Sep-1950, 61 y.o.   MRN: [id number] SUBJECTIVE: CC: Chief Complaint  Patient presents with  . Follow-up    3 month only med you refill is meloxicam and omperazole and the others is filled by Dr Venetia Maxon     HPI: Patient is here for follow up of hyperlipidemia/GERD/HH/chronic neck pain/Prediabetes: denies Headache;denies Chest Pain;denies weakness;denies Shortness of Breath and orthopnea;denies Visual changes;denies palpitations;denies cough;denies pedal edema;denies symptoms of TIA or stroke;deniesClaudication symptoms. admits to Compliance with medications; denies Problems with medications.  Has allergies: head stuffed up with the dust at this time of the year.  Past Medical History  Diagnosis Date  . Hearing loss   . 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 Cough     hx smoking  . Headache(784.0)     headaches r/t cervical issues  . Arthritis   . Chronic back pain     buldging disc  . Chronic neck pain     ruptured disc  . GERD (gastroesophageal reflux disease)     takes Prilosec daily  . H/O hiatal hernia   . History of colon polyps   . Diabetes mellitus     doesn't take anything;diet and exercise controlled  . Insomnia     takes Elavil nightly   Past Surgical History  Procedure Laterality Date  . Hand surgery  2005    right  . Lower back surgery  2009    had plates and screws inserted  . Neck surgery  2009    had insertion of  plates and screws  . Inner ear surgery      bil;titanium in both ears  . Appendectomy      at age 14  . Colonoscopy    . Cardiac catheterization  06/03/04  . Anterior cervical decomp/discectomy fusion  04/12/2011    Procedure: ANTERIOR CERVICAL DECOMPRESSION/DISCECTOMY FUSION 2 LEVELS;  Surgeon: Dorian Heckle, MD;  Location: [facility];  Service: Neurosurgery;  Laterality: N/A;  exploration of Cervical four - seven  fusion with redo Cervical six- seven, cervical three- four anterior cervical  decompression with fusion interbody prothesis plating and bonegraft and C34 anterior cervical decompression with inte   History   Social History  . Marital Status: Married    Spouse Name: N/A    Number of Children: N/A  . Years of Education: N/A   Occupational History  . Not on file.   Social History Main Topics  . Smoking status: Former Smoker -- 1.00 packs/day for 41 years    Quit date: 06/29/2002  . Smokeless tobacco: Never Used     Comment: quit 19yrs ago  . Alcohol Use: No     Comment: occasional less than one a week  . Drug Use: No  . Sexual Activity: Yes   Other Topics Concern  . Not on file   Social History Narrative  . No narrative on file   Family History  Problem Relation Age of Onset  . Cancer Father     colon  . Colon cancer Father   . Anesthesia problems Neg Hx   . Hypotension Neg Hx   . Malignant hyperthermia Neg Hx   . Pseudochol deficiency Neg Hx   . Colon cancer Maternal Grandmother   . Colon cancer Maternal Grandfather    Current Outpatient Prescriptions on File Prior to Visit  Medication Sig Dispense Refill  . amitriptyline (ELAVIL) 50 MG tablet Take 50 mg by mouth at bedtime.       Marland Kitchen  gabapentin (NEURONTIN) 300 MG capsule Take 1 capsule by mouth Three times a day.      Marland Kitchen HYDROcodone-acetaminophen (NORCO) 10-325 MG per tablet Take 1 tablet by mouth 3 (three) times daily.      . meloxicam (MOBIC) 15 MG tablet TAKE 1 TABLET DAILY  30 tablet  3  . omeprazole (PRILOSEC) 20 MG capsule Take 1 capsule (20 mg total) by mouth daily.  30 capsule  2  . celecoxib (CELEBREX) 200 MG capsule Take 1 capsule (200 mg total) by mouth 2 (two) times daily.  60 capsule  2   No current facility-administered medications on file prior to visit.   Allergies  Allergen Reactions  . Percocet [Oxycodone-Acetaminophen] Other (See Comments)    Hallucinations, inability to sleep.    There is no immunization history on file for this patient. Prior to Admission medications    Medication Sig Start Date End Date Taking? Authorizing Provider  amitriptyline (ELAVIL) 50 MG tablet Take 50 mg by mouth at bedtime.    Yes Historical Provider, MD  gabapentin (NEURONTIN) 300 MG capsule Take 1 capsule by mouth Three times a day. 11/15/11  Yes Historical Provider, MD  HYDROcodone-acetaminophen (NORCO) 10-325 MG per tablet Take 1 tablet by mouth 3 (three) times daily.   Yes Historical Provider, MD  meloxicam (MOBIC) 15 MG tablet TAKE 1 TABLET DAILY 10/29/12  Yes Ileana Ladd, MD  omeprazole (PRILOSEC) 20 MG capsule Take 1 capsule (20 mg total) by mouth daily. 12/04/12  Yes Hilarie Fredrickson, MD  celecoxib (CELEBREX) 200 MG capsule Take 1 capsule (200 mg total) by mouth 2 (two) times daily. 07/20/12   Ileana Ladd, MD     ROS: As above in the HPI. All other systems are stable or negative.  OBJECTIVE: APPEARANCE:  Patient in no acute distress.The patient appeared well nourished and normally developed. Acyanotic. Waist: VITAL SIGNS:BP 121/79  Pulse 81  Temp(Src) 97.2 F (36.2 C) (Oral)  Ht 5\' 11"  (1.803 m)  Wt 158 lb 3.2 oz (71.759 kg)  BMI 22.07 kg/m2  WM  SKIN: warm and  Dry without overt rashes, tattoos and scars  HEAD and Neck: without JVD, Head and scalp: normal Eyes:No scleral icterus. Fundi normal, eye movements normal. Ears: Auricle normal, canal normal, Tympanic membranes normal, insufflation normal. Nose: normal Throat: normal Neck & thyroid: normal  CHEST & LUNGS: Chest wall: normal Lungs: Clear  CVS: Reveals the PMI to be normally located. Regular rhythm, First and Second Heart sounds are normal,  absence of murmurs, rubs or gallops. Peripheral vasculature: Radial pulses: normal Dorsal pedis pulses: normal Posterior pulses: normal  ABDOMEN:  Appearance: normal Benign, no organomegaly, no masses, no Abdominal Aortic enlargement. No Guarding , no rebound. No Bruits. Bowel sounds: normal  RECTAL: N/A GU: N/A  EXTREMETIES:  nonedematous.  MUSCULOSKELETAL:  Spine: normal Joints: intact  NEUROLOGIC: oriented to time,place and person; nonfocal. Strength is normal Sensory is normal Reflexes are normal Cranial Nerves are normal. Results for orders placed in visit on 08/30/12  COMPLETE METABOLIC PANEL WITH GFR      Result Value Range   Sodium 136  135 - 145 mEq/L   Potassium 5.2  3.5 - 5.3 mEq/L   Chloride 103  96 - 112 mEq/L   CO2 26  19 - 32 mEq/L   Glucose, Bld 67 (*) 70 - 99 mg/dL   BUN 11  6 - 23 mg/dL   Creat 6.57  8.46 - 9.62 mg/dL   Total Bilirubin  0.7  0.3 - 1.2 mg/dL   Alkaline Phosphatase 88  39 - 117 U/L   AST 32  0 - 37 U/L   ALT 19  0 - 53 U/L   Total Protein 7.3  6.0 - 8.3 g/dL   Albumin 3.9  3.5 - 5.2 g/dL   Calcium 9.6  8.4 - 16.1 mg/dL   GFR, Est African American >89     GFR, Est Non African American >89    NMR LIPOPROFILE WITH LIPIDS      Result Value Range   LDL Particle Number 697  <1000 nmol/L   LDL (calc) 55  <100 mg/dL   HDL-C 39 (*) >=09 mg/dL   Triglycerides 604 (*) <150 mg/dL   Cholesterol, Total 540  <200 mg/dL   HDL Particle Number 98.1 (*) >=30.5 umol/L   Large HDL-P 5.0  >=4.8 umol/L   Large VLDL-P 7.5 (*) <=2.7 nmol/L   Small LDL Particle Number 473  <=527 nmol/L   LDL Size 19.6 (*) >20.5 nm   HDL Size 9.2  >=9.2 nm   VLDL Size 47.8 (*) <=46.6 nm   LP-IR Score 57 (*) <=45  TSH      Result Value Range   TSH 2.358  0.350 - 4.500 uIU/mL  POCT GLYCOSYLATED HEMOGLOBIN (HGB A1C)      Result Value Range   Hemoglobin A1C 5.3%      ASSESSMENT: HLD (hyperlipidemia) - Plan: CMP14+EGFR, NMR, lipoprofile  Arthritis - Plan: meloxicam (MOBIC) 15 MG tablet  ESOPHAGEAL STRICTURE  GERD - Plan: omeprazole (PRILOSEC) 20 MG capsule  HIATAL HERNIA  Neck pain, chronic  Prediabetes  Hyperglycemia - Plan: POCT glycosylated hemoglobin (Hb A1C)  Former smoker - Plan: CT Chest Wo Contrast  Cough - Plan: DG Chest 2 View   PLAN: Orders Placed This Encounter   Procedures  . DG Chest 2 View    Standing Status: Future     Number of Occurrences:      Standing Expiration Date: 02/10/2014    Order Specific Question:  Reason for Exam (SYMPTOM  OR DIAGNOSIS REQUIRED)    Answer:  exsmoker, cough    Order Specific Question:  Preferred imaging location?    Answer:  Internal  . CT Chest Wo Contrast    Standing Status: Future     Number of Occurrences:      Standing Expiration Date: 02/10/2014    Order Specific Question:  Reason for Exam (SYMPTOM  OR DIAGNOSIS REQUIRED)    Answer:  low dose CT chest , screen for lung cancer, 41 pack years smoker. occassional cough    Order Specific Question:  Preferred imaging location?    Answer:  [facility]  . CMP14+EGFR  . NMR, lipoprofile  . POCT glycosylated hemoglobin (Hb A1C)   [facility] reading (PRIMARY) by  Dr. Modesto Charon: hyperinflation, COPD. No acute findings.                                Meds ordered this encounter  Medications  . omeprazole (PRILOSEC) 20 MG capsule    Sig: Take 1 capsule (20 mg total) by mouth daily.    Dispense:  30 capsule    Refill:  5  . meloxicam (MOBIC) 15 MG tablet    Sig: TAKE 1 TABLET DAILY    Dispense:  30 tablet    Refill:  3         Dr Woodroe Mode  Recommendations  For nutrition information, I recommend books:  1).Eat to Live by Dr Monico Hoar. 2).Prevent and Reverse Heart Disease by Dr Suzzette Righter. 3) Dr Katherina Right Book:  Program to Reverse Diabetes  Exercise recommendations are:  If unable to walk, then the patient can exercise in a chair 3 times a day. By flapping arms like a bird gently and raising legs outwards to the front.  If ambulatory, the patient can go for walks for 30 minutes 3 times a week. Then increase the intensity and duration as tolerated.  Goal is to try to attain exercise frequency to 5 times a week.  If applicable: Best to perform resistance exercises (machines or weights) 2 days a week and cardio type exercises 3 days  per week.  Healthy lifestyle promoted and  Discussed. Same  Regimen. Follow up with D Venetia Maxon.  Return in about 4 months (around 04/12/2013) for Recheck medical problems.  Jaquane Boughner P. Modesto Charon, M.D.

## 2012-12-11 NOTE — Patient Instructions (Signed)
      Dr Alexsis Branscom's Recommendations  For nutrition information, I recommend books:  1).Eat to Live by Dr Joel Fuhrman. 2).Prevent and Reverse Heart Disease by Dr Caldwell Esselstyn. 3) Dr Neal Barnard's Book:  Program to Reverse Diabetes  Exercise recommendations are:  If unable to walk, then the patient can exercise in a chair 3 times a day. By flapping arms like a bird gently and raising legs outwards to the front.  If ambulatory, the patient can go for walks for 30 minutes 3 times a week. Then increase the intensity and duration as tolerated.  Goal is to try to attain exercise frequency to 5 times a week.  If applicable: Best to perform resistance exercises (machines or weights) 2 days a week and cardio type exercises 3 days per week.  

## 2012-12-12 LAB — NMR, LIPOPROFILE
Cholesterol: 143 mg/dL (ref <200)
HDL Cholesterol by NMR: 36 mg/dL — ABNORMAL LOW (ref >=40)
HDL Particle Number: 35.5 umol/L (ref >=30.5)
LDL Particle Number: 1208 nmol/L — ABNORMAL HIGH (ref <1000)
LDL Size: 19.9 nm — ABNORMAL LOW (ref >20.5)
LDLC SERPL CALC-MCNC: 54 mg/dL (ref <100)
LP-IR Score: 46 — ABNORMAL HIGH (ref <=45)
Small LDL Particle Number: 1060 nmol/L — ABNORMAL HIGH (ref <=527)
Triglycerides by NMR: 264 mg/dL — ABNORMAL HIGH (ref <150)

## 2012-12-12 LAB — CMP14+EGFR
ALT: 24 IU/L (ref 0–44)
AST: 40 IU/L (ref 0–40)
Albumin/Globulin Ratio: 1.5 (ref 1.1–2.5)
Albumin: 4.4 g/dL (ref 3.6–4.8)
Alkaline Phosphatase: 85 IU/L (ref 39–117)
BUN/Creatinine Ratio: 13 (ref 10–22)
BUN: 10 mg/dL (ref 8–27)
CO2: 24 mmol/L (ref 18–29)
Calcium: 9.8 mg/dL (ref 8.6–10.2)
Chloride: 100 mmol/L (ref 97–108)
Creatinine, Ser: 0.8 mg/dL (ref 0.76–1.27)
GFR calc Af Amer: 112 mL/min/{1.73_m2} (ref >59)
GFR calc non Af Amer: 97 mL/min/{1.73_m2} (ref >59)
Globulin, Total: 2.9 g/dL (ref 1.5–4.5)
Glucose: 97 mg/dL (ref 65–99)
Potassium: 5.3 mmol/L — ABNORMAL HIGH (ref 3.5–5.2)
Sodium: 138 mmol/L (ref 134–144)
Total Bilirubin: 0.4 mg/dL (ref 0.0–1.2)
Total Protein: 7.3 g/dL (ref 6.0–8.5)

## 2012-12-16 NOTE — Progress Notes (Signed)
Quick Note:  Labs abnormal.triglycerides are high. Start on omega 3 fish oil 2 grams twice a day. Reduce carbs that are rapidly digested such as mashed potatoes and pasta, avoid Sweets and sugars. Patient is not diabetic. Keep the follow up in 4 months.   ______

## 2012-12-19 ENCOUNTER — Telehealth: Payer: Self-pay | Admitting: Family Medicine

## 2012-12-19 MED ORDER — MOMETASONE FUROATE 50 MCG/ACT NA SUSP: 2.0000 | Freq: Every day | NASAL | Status: DC

## 2012-12-19 NOTE — Telephone Encounter (Signed)
PT CALLED AND PHARM AWARE OF RX

## 2013-01-14 ENCOUNTER — Encounter: Payer: Self-pay | Admitting: Internal Medicine

## 2013-01-14 ENCOUNTER — Ambulatory Visit (INDEPENDENT_AMBULATORY_CARE_PROVIDER_SITE_OTHER): Payer: Medicare Other | Admitting: Internal Medicine

## 2013-01-14 VITALS — BP 126/74 | HR 84 | Ht 69.0 in | Wt 157.4 lb

## 2013-01-14 DIAGNOSIS — K219 Gastro-esophageal reflux disease without esophagitis: Secondary | ICD-10-CM

## 2013-01-14 MED ORDER — OMEPRAZOLE 20 MG PO CPDR: 20.0000 mg | DELAYED_RELEASE_CAPSULE | Freq: Every day | ORAL | Status: DC

## 2013-01-14 NOTE — Patient Instructions (Signed)
We have sent the following medications to your pharmacy for you to pick up at your convenience: Omeprazole  Please follow up with Dr. Perry in 2 years. 

## 2013-01-14 NOTE — Progress Notes (Signed)
HISTORY OF PRESENT ILLNESS:  Cameron Roman is a 62 y.o. male with a history of degenerative spine disease for which he is undergone neck and back surgeries, GERD complicated by peptic stricture, adenomatous colon polyps, family history of colon cancer, and diverticulosis. The patient presents today for routine followup. He was last seen in the office 07/29/2009. His last upper endoscopy with esophageal dilation to a maximal diameter of 18 mm was October 2011. He continues on Prilosec 20 mg daily. On medication no reflux symptoms or recurrent esophageal dysphagia. He requested medication refill. He does have a history of advanced adenoma. Last colonoscopy October 2013 reveal a diminutive polyp and diverticulosis. Otherwise normal. Followup in 5 years recommended. Aside from belching, his GI review of systems is otherwise negative. Review of outside laboratories from September 2014 show unremarkable comprehensive metabolic panel REVIEW OF SYSTEMS:  All non-GI ROS negative except for sinus and allergy trouble, arthritis, back pain, cough, hearing problems  Past Medical History  Diagnosis Date  . Hearing loss   . 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 Cough     hx smoking  . Headache(784.0)     headaches r/t cervical issues  . Arthritis   . Chronic back pain     buldging disc  . Chronic neck pain     ruptured disc  . GERD (gastroesophageal reflux disease)     takes Prilosec daily  . H/O hiatal hernia   . History of colon polyps   . Diabetes mellitus     doesn't take anything;diet and exercise controlled  . Insomnia     takes Elavil nightly  . Diverticulosis   . Esophageal stricture     Past Surgical History  Procedure Laterality Date  . Hand surgery Right 2005    right  . Lower back surgery  2009    had plates and screws inserted  . Neck surgery  2009    had insertion of  plates and screws  . Inner ear surgery Bilateral     bil;titanium in both ears  . Appendectomy      at age  62  . Colonoscopy    . Cardiac catheterization  06/03/04  . Anterior cervical decomp/discectomy fusion  04/12/2011    Procedure: ANTERIOR CERVICAL DECOMPRESSION/DISCECTOMY FUSION 2 LEVELS;  Surgeon: Dorian Heckle, MD;  Location: [facility];  Service: Neurosurgery;  Laterality: N/A;  exploration of Cervical four - seven  fusion with redo Cervical six- seven, cervical three- four anterior cervical decompression with fusion interbody prothesis plating and bonegraft and C34 anterior cervical decompression with inte    Social History Cameron Roman  reports that he quit smoking about 10 years ago. His smoking use included Cigarettes. He has a 41 pack-year smoking history. He has never used smokeless tobacco. He reports that he does not drink alcohol or use illicit drugs.  family history includes Colon cancer in his father, maternal grandfather, and maternal grandmother; Heart disease in his maternal uncle; Stroke in his maternal uncle. There is no history of Anesthesia problems, Hypotension, Malignant hyperthermia, or Pseudochol deficiency.  Allergies  Allergen Reactions  . Percocet [Oxycodone-Acetaminophen] Other (See Comments)    Hallucinations, inability to sleep.       PHYSICAL EXAMINATION: Vital signs: BP 126/74  Pulse 84  Ht 5\' 9"  (1.753 m)  Wt 157 lb 6 oz (71.385 kg)  BMI 23.23 kg/m2 General: Well-developed, well-nourished, no acute distress HEENT: Sclerae are anicteric, conjunctiva pink. Oral mucosa intact Lungs: Clear Heart: Regular  Abdomen: soft, nontender, nondistended, no obvious ascites, no peritoneal signs, normal bowel sounds. No organomegaly. Extremities: No edema Psychiatric: alert and oriented x3. Cooperative    ASSESSMENT:  #1. GERD complicated by peptic stricture. Last esophageal dilation October 2011. Asymptomatic post dilation on PPI #2. Personal history of advanced adenoma. Family history of colon cancer. Last colonoscopy October 2013   PLAN:  #1. Refill  omeprazole 20 mg daily #2. Reflux precautions #3. Surveillance colonoscopy around October 2018 #4. Routine GI followup in 2 years. Sooner if needed. Return to care PCP

## 2013-02-20 ENCOUNTER — Ambulatory Visit (INDEPENDENT_AMBULATORY_CARE_PROVIDER_SITE_OTHER): Payer: Medicare Other

## 2013-02-20 ENCOUNTER — Ambulatory Visit (INDEPENDENT_AMBULATORY_CARE_PROVIDER_SITE_OTHER): Payer: Medicare Other | Admitting: Family Medicine

## 2013-02-20 ENCOUNTER — Encounter: Payer: Self-pay | Admitting: Family Medicine

## 2013-02-20 VITALS — BP 136/87 | HR 97 | Temp 98.1°F | Ht 70.0 in | Wt 155.4 lb

## 2013-02-20 DIAGNOSIS — M25562 Pain in left knee: Secondary | ICD-10-CM

## 2013-02-20 DIAGNOSIS — M25569 Pain in unspecified knee: Secondary | ICD-10-CM

## 2013-02-20 MED ORDER — NAPROXEN 500 MG PO TABS: 500.0000 mg | ORAL_TABLET | Freq: Two times a day (BID) | ORAL | Status: DC

## 2013-02-20 NOTE — Progress Notes (Signed)
   Subjective:    Patient ID: Karin Griffith, male    DOB: 09/08/1950, 62 y.o.   MRN: [id number]  HPI This 62 y.o. male presents for evaluation of left knee discomfort after falling and injuring  His knee.   Review of Systems C/o left knee pain. No chest pain, SOB, HA, dizziness, vision change, N/V, diarrhea, constipation, dysuria, urinary urgency or frequency, myalgias, arthralgias or rash.     Objective:   Physical Exam  Vital signs noted  Well developed well nourished male.  HEENT - Head atraumatic Normocephalic                Eyes - PERRLA, Conjuctiva - clear Sclera- Clear EOMI                Ears - EAC's Wnl TM's Wnl Gross Hearing WNL                Throat - oropharanx wnl Respiratory - Lungs CTA bilateral Cardiac - RRR S1 and S2 without murmur GI - Abdomen soft Nontender and bowel sounds active x 4 MS - TTP left medial knee.  No crepitus, negative drawer, negative mcmurray, And no swelling or deformity of left knee.     Left knee xray - No fracture Assessment & Plan:  Knee pain, acute, left - Plan: DG Knee 1-2 Views Left, naproxen (NAPROSYN) 500 MG tablet po bid X 10 days.  Follow up if not better.  Deatra Canter FNP

## 2013-03-12 ENCOUNTER — Ambulatory Visit (INDEPENDENT_AMBULATORY_CARE_PROVIDER_SITE_OTHER): Payer: Medicare Other | Admitting: Family Medicine

## 2013-03-12 ENCOUNTER — Encounter: Payer: Self-pay | Admitting: Family Medicine

## 2013-03-12 VITALS — BP 124/83 | HR 77 | Temp 98.1°F | Ht 70.0 in | Wt 154.0 lb

## 2013-03-12 DIAGNOSIS — M25569 Pain in unspecified knee: Secondary | ICD-10-CM

## 2013-03-12 DIAGNOSIS — Z23 Encounter for immunization: Secondary | ICD-10-CM

## 2013-03-12 DIAGNOSIS — M1712 Unilateral primary osteoarthritis, left knee: Secondary | ICD-10-CM

## 2013-03-12 DIAGNOSIS — M1710 Unilateral primary osteoarthritis, unspecified knee: Secondary | ICD-10-CM

## 2013-03-12 MED ORDER — HYDROCODONE-ACETAMINOPHEN 5-325 MG PO TABS: 1.0000 | ORAL_TABLET | Freq: Four times a day (QID) | ORAL | Status: DC | PRN

## 2013-03-12 NOTE — Progress Notes (Signed)
   Subjective:    Patient ID: Cameron Roman, male    DOB: Oct 29, 1950, 62 y.o.   MRN: [id number]  HPI This 62 y.o. male presents for evaluation of left knee pain.  He injured his knee a few Weeks ago and was tx'd with naprosyn and states his knee is still hurting..   Review of Systems No chest pain, SOB, HA, dizziness, vision change, N/V, diarrhea, constipation, dysuria, urinary urgency or frequency, myalgias, arthralgias or rash.     Objective:   Physical Exam Vital signs noted  Well developed well nourished male.  HEENT - Head atraumatic Normocephalic                Eyes - PERRLA, Conjuctiva - clear Sclera- Clear EOMI Respiratory - Lungs CTA bilateral Cardiac - RRR S1 and S2 without murmur MS - TTP left knee.    Procedure - Left knee lateral prepped with ETOH and then injected with 2 cc's of lidocaine  And one cc of kenalog and patient expresses immediate relief.       Assessment & Plan:    Osteoarthritis of left knee - Plan: HYDROcodone-acetaminophen (NORCO) 5-325 MG per tablet Left knee injection.  Explained to take it easy for next few days and if not better then refer to Ortho.  Deatra Canter FNP

## 2013-03-12 NOTE — Patient Instructions (Signed)
Herpes Zoster Virus Vaccine What is this medicine? HERPES ZOSTER VIRUS VACCINE (HUR peez ZOS ter vahy ruhs vak SEEN) is a vaccine. It is used to prevent shingles in adults 61 years old and over. This vaccine is not used to treat shingles or nerve pain from shingles. This medicine may be used for other purposes; ask your health care provider or pharmacist if you have questions. COMMON BRAND NAME(S): Zostavax What should I tell my health care provider before I take this medicine? They need to know if you have any of these conditions: -cancer like leukemia or lymphoma -immune system problems or therapy -infection with fever -tuberculosis -an unusual or allergic reaction to vaccines, neomycin, gelatin, other medicines, foods, dyes, or preservatives -pregnant or trying to get pregnant -breast-feeding How should I use this medicine? This vaccine is for injection under the skin. It is given by a health care professional. Talk to your pediatrician regarding the use of this medicine in children. This medicine is not approved for use in children. Overdosage: If you think you have taken too much of this medicine contact a poison control center or emergency room at once. NOTE: This medicine is only for you. Do not share this medicine with others. What if I miss a dose? This does not apply. What may interact with this medicine? Do not take this medicine with any of the following medications: -adalimumab -anakinra -etanercept -infliximab -medicines to treat cancer -medicines that suppress your immune system This medicine may also interact with the following medications: -immunoglobulins -steroid medicines like prednisone or cortisone This list may not describe all possible interactions. Give your health care provider a list of all the medicines, herbs, non-prescription drugs, or dietary supplements you use. Also tell them if you smoke, drink alcohol, or use illegal drugs. Some items may interact  with your medicine. What should I watch for while using this medicine? Visit your doctor for regular check ups. This vaccine, like all vaccines, may not fully protect everyone. After receiving this vaccine it may be possible to pass chickenpox infection to others. Avoid people with immune system problems, pregnant women who have not had chickenpox, and newborns of women who have not had chickenpox. Talk to your doctor for more information. What side effects may I notice from receiving this medicine? Side effects that you should report to your doctor or health care professional as soon as possible: -allergic reactions like skin rash, itching or hives, swelling of the face, lips, or tongue -breathing problems -feeling faint or lightheaded, falls -fever, flu-like symptoms -pain, tingling, numbness in the hands or feet -swelling of the ankles, feet, hands -unusually weak or tired Side effects that usually do not require medical attention (report to your doctor or health care professional if they continue or are bothersome): -aches or pains -chickenpox-like rash -diarrhea -headache -loss of appetite -nausea, vomiting -redness, pain, swelling at site where injected -runny nose This list may not describe all possible side effects. Call your doctor for medical advice about side effects. You may report side effects to FDA at 1-800-FDA-1088. Where should I keep my medicine? This drug is given in a hospital or clinic and will not be stored at home. NOTE: This sheet is a summary. It may not cover all possible information. If you have questions about this medicine, talk to your doctor, pharmacist, or health care provider.  2014, Elsevier/Gold Standard. (2009-08-17 17:43:50)  

## 2013-03-27 ENCOUNTER — Other Ambulatory Visit: Payer: Self-pay | Admitting: *Deleted

## 2013-03-27 MED ORDER — FLUTICASONE PROPIONATE 50 MCG/ACT NA SUSP: 2.0000 | Freq: Every day | NASAL | Status: DC

## 2013-03-29 ENCOUNTER — Encounter: Payer: Self-pay | Admitting: Family Medicine

## 2013-03-29 ENCOUNTER — Ambulatory Visit (INDEPENDENT_AMBULATORY_CARE_PROVIDER_SITE_OTHER): Payer: Medicare Other | Admitting: Family Medicine

## 2013-03-29 VITALS — BP 128/79 | HR 77 | Temp 98.4°F | Ht 70.0 in | Wt 156.0 lb

## 2013-03-29 DIAGNOSIS — M25569 Pain in unspecified knee: Secondary | ICD-10-CM

## 2013-03-29 MED ORDER — NAPROXEN 500 MG PO TABS
500.0000 mg | ORAL_TABLET | Freq: Two times a day (BID) | ORAL | Status: DC
Start: 2013-03-29 — End: 2013-11-19

## 2013-03-29 NOTE — Patient Instructions (Signed)

## 2013-03-29 NOTE — Progress Notes (Signed)
   Subjective:    Patient ID: Cameron Roman, male    DOB: [birth year], 63 y.o.   MRN: [id number]  HPI This 63 y.o. male presents for evaluation of left knee pain.  He was seen a few weeks Ago and received an injection which helped but now his knee is hurting again and he wants To get another knee injection.   Review of Systems C/o arthralgias No chest pain, SOB, HA, dizziness, vision change, N/V, diarrhea, constipation, dysuria, urinary urgency or frequency or rash.     Objective:   Physical Exam Vital signs noted  Well developed well nourished male.  HEENT - Head atraumatic Normocephalic                Eyes - PERRLA, Conjuctiva - clear Sclera- Clear EOMI Respiratory - Lungs CTA bilateral Cardiac - RRR S1 and S2 without murmur MS - TTP medial aspect of left knee.       Assessment & Plan:  Knee pain - Plan: naproxen (NAPROSYN) 500 MG tablet Po bid #60w/3.  Warned about GI side effects.   Lysbeth Penner FNP

## 2013-04-16 ENCOUNTER — Encounter: Payer: Self-pay | Admitting: Family Medicine

## 2013-04-16 ENCOUNTER — Encounter (INDEPENDENT_AMBULATORY_CARE_PROVIDER_SITE_OTHER): Payer: Self-pay

## 2013-04-16 ENCOUNTER — Ambulatory Visit (INDEPENDENT_AMBULATORY_CARE_PROVIDER_SITE_OTHER): Payer: Medicare Other | Admitting: Family Medicine

## 2013-04-16 VITALS — BP 152/88 | HR 92 | Temp 98.2°F | Ht 70.0 in | Wt 156.8 lb

## 2013-04-16 DIAGNOSIS — K219 Gastro-esophageal reflux disease without esophagitis: Secondary | ICD-10-CM

## 2013-04-16 DIAGNOSIS — R7303 Prediabetes: Secondary | ICD-10-CM

## 2013-04-16 DIAGNOSIS — R7309 Other abnormal glucose: Secondary | ICD-10-CM

## 2013-04-16 DIAGNOSIS — E785 Hyperlipidemia, unspecified: Secondary | ICD-10-CM

## 2013-04-16 DIAGNOSIS — K449 Diaphragmatic hernia without obstruction or gangrene: Secondary | ICD-10-CM

## 2013-04-16 LAB — POCT GLYCOSYLATED HEMOGLOBIN (HGB A1C): Hemoglobin A1C: 5.3

## 2013-04-16 NOTE — Patient Instructions (Signed)
      Dr Audery Wassenaar's Recommendations  For nutrition information, I recommend books:  1).Eat to Live by Dr Joel Fuhrman. 2).Prevent and Reverse Heart Disease by Dr Caldwell Esselstyn. 3) Dr Neal Barnard's Book:  Program to Reverse Diabetes  Exercise recommendations are:  If unable to walk, then the patient can exercise in a chair 3 times a day. By flapping arms like a bird gently and raising legs outwards to the front.  If ambulatory, the patient can go for walks for 30 minutes 3 times a week. Then increase the intensity and duration as tolerated.  Goal is to try to attain exercise frequency to 5 times a week.  If applicable: Best to perform resistance exercises (machines or weights) 2 days a week and cardio type exercises 3 days per week.  

## 2013-04-16 NOTE — Progress Notes (Signed)
Patient ID: Cameron Roman, male   DOB: 05-Jul-1950, 63 y.o.   MRN: [id number] SUBJECTIVE: CC: Chief Complaint  Patient presents with  . Follow-up    4 month follow up states had PEPSI COLA this am     HPI: Patient is here for follow up of hyperlipidemia: denies Headache;denies Chest Pain;denies weakness;denies Shortness of Breath and orthopnea;denies Visual changes;denies palpitations;denies cough;denies pedal edema;denies symptoms of TIA or stroke;deniesClaudication symptoms. admits to Compliance with medications; denies Problems with medications.the omega-3 fish oil   Past Medical History  Diagnosis Date  . Hearing loss   . 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 Cough     hx smoking  . Headache(784.0)     headaches r/t cervical issues  . Arthritis   . Chronic back pain     buldging disc  . Chronic neck pain     ruptured disc  . GERD (gastroesophageal reflux disease)     takes Prilosec daily  . H/O hiatal hernia   . History of colon polyps   . Diabetes mellitus     doesn't take anything;diet and exercise controlled  . Insomnia     takes Elavil nightly  . Diverticulosis   . Esophageal stricture    Past Surgical History  Procedure Laterality Date  . Hand surgery Right 2005    right  . Lower back surgery  2009    had plates and screws inserted  . Neck surgery  2009    had insertion of  plates and screws  . Inner ear surgery Bilateral     bil;titanium in both ears  . Appendectomy      at age 36  . Colonoscopy    . Cardiac catheterization  06/03/04  . Anterior cervical decomp/discectomy fusion  04/12/2011    Procedure: ANTERIOR CERVICAL DECOMPRESSION/DISCECTOMY FUSION 2 LEVELS;  Surgeon: Peggyann Shoals, MD;  Location: [facility];  Service: Neurosurgery;  Laterality: N/A;  exploration of Cervical four - seven  fusion with redo Cervical six- seven, cervical three- four anterior cervical decompression with fusion interbody prothesis plating and bonegraft and C34 anterior  cervical decompression with inte   History   Social History  . Marital Status: Married    Spouse Name: N/A    Number of Children: 2  . Years of Education: N/A   Occupational History  . disabled    Social History Main Topics  . Smoking status: Former Smoker -- 1.00 packs/day for 41 years    Types: Cigarettes    Quit date: 06/29/2002  . Smokeless tobacco: Never Used     Comment: quit 23yr ago  . Alcohol Use: No     Comment: occasional less than one a week  . Drug Use: No  . Sexual Activity: Yes   Other Topics Concern  . Not on file   Social History Narrative  . No narrative on file   Family History  Problem Relation Age of Onset  . Colon cancer Father   . Anesthesia problems Neg Hx   . Hypotension Neg Hx   . Malignant hyperthermia Neg Hx   . Pseudochol deficiency Neg Hx   . Colon cancer Maternal Grandmother   . Colon cancer Maternal Grandfather   . Heart disease Maternal Uncle   . Stroke Maternal Uncle    Current Outpatient Prescriptions on File Prior to Visit  Medication Sig Dispense Refill  . amitriptyline (ELAVIL) 50 MG tablet Take 50 mg by mouth at bedtime.       .Marland Kitchen  fluticasone (FLONASE) 50 MCG/ACT nasal spray Place 2 sprays into both nostrils daily.  16 g  6  . gabapentin (NEURONTIN) 300 MG capsule Take 1 capsule by mouth Three times a day.      Marland Kitchen HYDROcodone-acetaminophen (NORCO) 10-325 MG per tablet Take 1 tablet by mouth 3 (three) times daily.      Marland Kitchen omeprazole (PRILOSEC) 20 MG capsule Take 1 capsule (20 mg total) by mouth daily.  30 capsule  11  . meloxicam (MOBIC) 15 MG tablet TAKE 1 TABLET DAILY  30 tablet  3  . naproxen (NAPROSYN) 500 MG tablet Take 1 tablet (500 mg total) by mouth 2 (two) times daily with a meal.  60 tablet  3   No current facility-administered medications on file prior to visit.   Allergies  Allergen Reactions  . Percocet [Oxycodone-Acetaminophen] Other (See Comments)    Hallucinations, inability to sleep.   Immunization History   Administered Date(s) Administered  . Influenza,inj,Quad PF,36+ Mos 12/11/2012  . Zoster 03/12/2013   Prior to Admission medications   Medication Sig Start Date End Date Taking? Authorizing Provider  amitriptyline (ELAVIL) 50 MG tablet Take 50 mg by mouth at bedtime.    Yes Historical Provider, MD  fluticasone (FLONASE) 50 MCG/ACT nasal spray Place 2 sprays into both nostrils daily. 03/27/13  Yes Vernie Shanks, MD  gabapentin (NEURONTIN) 300 MG capsule Take 1 capsule by mouth Three times a day. 11/15/11  Yes Historical Provider, MD  HYDROcodone-acetaminophen (NORCO) 10-325 MG per tablet Take 1 tablet by mouth 3 (three) times daily.   Yes Historical Provider, MD  meloxicam (MOBIC) 15 MG tablet TAKE 1 TABLET DAILY 12/11/12  Yes Vernie Shanks, MD  naproxen (NAPROSYN) 500 MG tablet Take 1 tablet (500 mg total) by mouth 2 (two) times daily with a meal. 03/29/13  Yes Lysbeth Penner, FNP  omeprazole (PRILOSEC) 20 MG capsule Take 1 capsule (20 mg total) by mouth daily. 01/14/13  Yes Irene Shipper, MD     ROS: As above in the HPI. All other systems are stable or negative.  OBJECTIVE: APPEARANCE:  Patient in no acute distress.The patient appeared well nourished and normally developed. Acyanotic. Waist: VITAL SIGNS:BP 152/88  Pulse 92  Temp(Src) 98.2 F (36.8 C) (Oral)  Ht _0  (1.778 m)  Wt 156 lb 12.8 oz (71.124 kg)  BMI 22.50 kg/m2 Recheck 128/86 WM   SKIN: warm and  Dry without overt rashes, tattoos and scars  HEAD and Neck: without JVD, Head and scalp: normal Eyes:No scleral icterus. Fundi normal, eye movements normal. Ears: Auricle normal, canal normal, Tympanic membranes normal, insufflation normal. Nose: normal Throat: normal Neck & thyroid: normal  CHEST & LUNGS: Chest wall: normal Lungs: Clear  CVS: Reveals the PMI to be normally located. Regular rhythm, First and Second Heart sounds are normal,  absence of murmurs, rubs or gallops. Peripheral vasculature: Radial  pulses: normal Dorsal pedis pulses: normal Posterior pulses: normal  ABDOMEN:  Appearance: normal Benign, no organomegaly, no masses, no Abdominal Aortic enlargement. No Guarding , no rebound. No Bruits. Bowel sounds: normal  RECTAL: N/A GU: N/A  EXTREMETIES: nonedematous.  MUSCULOSKELETAL:  Spine: normal Joints: intact  NEUROLOGIC: oriented to time,place and person; nonfocal. Strength is normal Sensory is normal Reflexes are normal Cranial Nerves are normal.   Results for orders placed in visit on 04/16/13  POCT GLYCOSYLATED HEMOGLOBIN (HGB A1C)      Result Value Range   Hemoglobin A1C 5.3%  ASSESSMENT: Prediabetes - Plan: POCT glycosylated hemoglobin (Hb A1C)  HLD (hyperlipidemia) - Plan: CMP14+EGFR, NMR, lipoprofile  HIATAL HERNIA  GERD  PLAN:      Dr Paula Libra Recommendations  For nutrition information, I recommend books:  1).Eat to Live by Dr Excell Seltzer. 2).Prevent and Reverse Heart Disease by Dr Karl Luke. 3) Dr Janene Harvey Book:  Program to Reverse Diabetes  Exercise recommendations are:  If unable to walk, then the patient can exercise in a chair 3 times a day. By flapping arms like a bird gently and raising legs outwards to the front.  If ambulatory, the patient can go for walks for 30 minutes 3 times a week. Then increase the intensity and duration as tolerated.  Goal is to try to attain exercise frequency to 5 times a week.  If applicable: Best to perform resistance exercises (machines or weights) 2 days a week and cardio type exercises 3 days per week.  Orders Placed This Encounter  Procedures  . CMP14+EGFR  . NMR, lipoprofile  . POCT glycosylated hemoglobin (Hb A1C)   Meds ordered this encounter  Medications  . omega-3 acid ethyl esters (LOVAZA) 1 G capsule    Sig: Take by mouth 2 (two) times daily.  . Omega-3 Fatty Acids (FISH OIL) 1000 MG CAPS    Sig: Take 2,000 mg by mouth 2 (two) times daily.   There are  no discontinued medications. Return in about 4 months (around 08/14/2013) for Recheck medical problems.  Keondra Haydu P. Jacelyn Grip, M.D.

## 2013-04-18 LAB — CMP14+EGFR
ALT: 21 IU/L (ref 0–44)
AST: 33 IU/L (ref 0–40)
Albumin/Globulin Ratio: 1.6 (ref 1.1–2.5)
Albumin: 4.4 g/dL (ref 3.6–4.8)
Alkaline Phosphatase: 91 IU/L (ref 39–117)
BUN/Creatinine Ratio: 14 (ref 10–22)
BUN: 11 mg/dL (ref 8–27)
CO2: 24 mmol/L (ref 18–29)
Calcium: 9.8 mg/dL (ref 8.6–10.2)
Chloride: 101 mmol/L (ref 97–108)
Creatinine, Ser: 0.81 mg/dL (ref 0.76–1.27)
GFR calc Af Amer: 111 mL/min/{1.73_m2} (ref >59)
GFR calc non Af Amer: 96 mL/min/{1.73_m2} (ref >59)
Globulin, Total: 2.7 g/dL (ref 1.5–4.5)
Glucose: 77 mg/dL (ref 65–99)
Potassium: 5 mmol/L (ref 3.5–5.2)
Sodium: 142 mmol/L (ref 134–144)
Total Bilirubin: 0.3 mg/dL (ref 0.0–1.2)
Total Protein: 7.1 g/dL (ref 6.0–8.5)

## 2013-04-18 LAB — NMR, LIPOPROFILE
Cholesterol: 156 mg/dL (ref <200)
HDL Cholesterol by NMR: 38 mg/dL — ABNORMAL LOW (ref >=40)
HDL Particle Number: 33.1 umol/L (ref >=30.5)
LDL Particle Number: 1248 nmol/L — ABNORMAL HIGH (ref <1000)
LDL Size: 20.7 nm (ref >20.5)
LDLC SERPL CALC-MCNC: 58 mg/dL (ref <100)
LP-IR Score: 66 — ABNORMAL HIGH (ref <=45)
Small LDL Particle Number: 930 nmol/L — ABNORMAL HIGH (ref <=527)
Triglycerides by NMR: 301 mg/dL — ABNORMAL HIGH (ref <150)

## 2013-05-20 DIAGNOSIS — M5417 Radiculopathy, lumbosacral region: Secondary | ICD-10-CM | POA: Insufficient documentation

## 2013-05-20 DIAGNOSIS — M545 Low back pain, unspecified: Secondary | ICD-10-CM | POA: Insufficient documentation

## 2013-07-02 ENCOUNTER — Encounter: Payer: Self-pay | Admitting: *Deleted

## 2013-08-12 ENCOUNTER — Other Ambulatory Visit: Payer: Self-pay

## 2013-08-12 DIAGNOSIS — M1990 Unspecified osteoarthritis, unspecified site: Secondary | ICD-10-CM

## 2013-08-12 MED ORDER — MELOXICAM 15 MG PO TABS: ORAL_TABLET | ORAL | Status: DC

## 2013-08-15 ENCOUNTER — Encounter: Payer: Self-pay | Admitting: Family Medicine

## 2013-08-15 ENCOUNTER — Ambulatory Visit (INDEPENDENT_AMBULATORY_CARE_PROVIDER_SITE_OTHER): Payer: Medicare Other | Admitting: Family Medicine

## 2013-08-15 VITALS — BP 129/73 | HR 74 | Temp 98.7°F | Ht 70.0 in | Wt 162.2 lb

## 2013-08-15 DIAGNOSIS — M25562 Pain in left knee: Secondary | ICD-10-CM

## 2013-08-15 DIAGNOSIS — M25569 Pain in unspecified knee: Secondary | ICD-10-CM

## 2013-08-15 NOTE — Progress Notes (Signed)
   Subjective:    Patient ID: Cameron Roman, male    DOB: Jun 06, 1950, 63 y.o.   MRN: [id number]  HPI  This 63 y.o. male presents for evaluation of routine follow up.  He has hx of hyperlipidemia and GERD. He has hx of OA and knee pain.  He is requesting another knee injection because the one done 3 months worked well and his knee is hurting again.  Review of Systems C/o left knee pain   No chest pain, SOB, HA, dizziness, vision change, N/V, diarrhea, constipation, dysuria, urinary urgency or frequency, myalgias, arthralgias or rash.  Objective:   Physical Exam   Vital signs noted  Well developed well nourished male.  HEENT - Head atraumatic Normocephalic                Eyes - PERRLA, Conjuctiva - clear Sclera- Clear EOMI                Ears - EAC's Wnl TM's Wnl Gross Hearing WNL                Throat - oropharanx wnl Respiratory - Lungs CTA bilateral Cardiac - RRR S1 and S2 without murmur GI - Abdomen soft Nontender and bowel sounds active x 4 MS - TTP left knee  Procedure - Left lateral knee prepped with ETOH and then 2 cc's of lidocaine 1% and one cc of 80depomedrol injected under patella and injected w/o resistance and patient tolerates well an expresses immediate relief.       Assessment & Plan:  Left knee pain Left knee injection with lidocaine and depomedrol  Follow up in 3 months  Lysbeth Penner FNP

## 2013-11-19 ENCOUNTER — Ambulatory Visit (INDEPENDENT_AMBULATORY_CARE_PROVIDER_SITE_OTHER): Payer: Medicare Other | Admitting: Family Medicine

## 2013-11-19 ENCOUNTER — Encounter: Payer: Self-pay | Admitting: Family Medicine

## 2013-11-19 VITALS — BP 147/89 | HR 78 | Temp 97.8°F | Ht 70.0 in | Wt 163.0 lb

## 2013-11-19 DIAGNOSIS — Z0000 Encounter for general adult medical examination without abnormal findings: Secondary | ICD-10-CM

## 2013-11-19 DIAGNOSIS — M129 Arthropathy, unspecified: Secondary | ICD-10-CM

## 2013-11-19 DIAGNOSIS — R7309 Other abnormal glucose: Secondary | ICD-10-CM

## 2013-11-19 DIAGNOSIS — M1990 Unspecified osteoarthritis, unspecified site: Secondary | ICD-10-CM

## 2013-11-19 LAB — POCT CBC
Granulocyte percent: 50 %G (ref 37–80)
HCT, POC: 45.8 % (ref 43.5–53.7)
Hemoglobin: 15.6 g/dL (ref 14.1–18.1)
Lymph, poc: 5.3 — AB (ref 0.6–3.4)
MCH, POC: 33.9 pg — AB (ref 27–31.2)
MCHC: 34.1 g/dL (ref 31.8–35.4)
MCV: 99.4 fL — AB (ref 80–97)
MPV: 7.6 fL (ref 0–99.8)
POC Granulocyte: 5.8 (ref 2–6.9)
POC LYMPH PERCENT: 46.1 %L (ref 10–50)
Platelet Count, POC: 330 10*3/uL (ref 142–424)
RBC: 4.6 M/uL — AB (ref 4.69–6.13)
RDW, POC: 14 %
WBC: 11.6 10*3/uL — AB (ref 4.6–10.2)

## 2013-11-19 LAB — POCT GLYCOSYLATED HEMOGLOBIN (HGB A1C): Hemoglobin A1C: 5.3

## 2013-11-19 MED ORDER — MELOXICAM 15 MG PO TABS: ORAL_TABLET | ORAL | Status: DC

## 2013-11-19 NOTE — Progress Notes (Signed)
   Subjective:    Patient ID: Cameron Roman, male    DOB: 16-Mar-1950, 63 y.o.   MRN: [id number]  HPI This 63 y.o. male presents for evaluation of routine visit.  He has hx of diabetes.   Review of Systems No chest pain, SOB, HA, dizziness, vision change, N/V, diarrhea, constipation, dysuria, urinary urgency or frequency, myalgias, arthralgias or rash.     Objective:   Physical Exam Vital signs noted  Well developed well nourished male.  HEENT - Head atraumatic Normocephalic                Eyes - PERRLA, Conjuctiva - clear Sclera- Clear EOMI                Ears - EAC's Wnl TM's Wnl Gross Hearing WNL                Nose - Nares patent                 Throat - oropharanx wnl Respiratory - Lungs CTA bilateral Cardiac - RRR S1 and S2 without murmur GI - Abdomen soft Nontender and bowel sounds active x 4 Extremities - No edema. Neuro - Grossly intact.       Assessment & Plan:  Arthritis - Plan: meloxicam (MOBIC) 15 MG tablet, DISCONTINUED: meloxicam (MOBIC) 15 MG tablet  Routine general medical examination at a health care facility - Plan: POCT CBC, POCT glycosylated hemoglobin (Hb A1C), CMP14+EGFR, PSA, total and free  Lysbeth Penner FNP

## 2013-11-20 ENCOUNTER — Other Ambulatory Visit (INDEPENDENT_AMBULATORY_CARE_PROVIDER_SITE_OTHER): Payer: Medicare Other

## 2013-11-20 DIAGNOSIS — R7989 Other specified abnormal findings of blood chemistry: Secondary | ICD-10-CM

## 2013-11-20 LAB — CMP14+EGFR
ALT: 23 IU/L (ref 0–44)
AST: 32 IU/L (ref 0–40)
Albumin/Globulin Ratio: 1.3 (ref 1.1–2.5)
Albumin: 4 g/dL (ref 3.6–4.8)
Alkaline Phosphatase: 105 IU/L (ref 39–117)
BUN/Creatinine Ratio: 20 (ref 10–22)
BUN: 15 mg/dL (ref 8–27)
CO2: 25 mmol/L (ref 18–29)
Calcium: 9.7 mg/dL (ref 8.6–10.2)
Chloride: 99 mmol/L (ref 97–108)
Creatinine, Ser: 0.76 mg/dL (ref 0.76–1.27)
GFR calc Af Amer: 114 mL/min/{1.73_m2} (ref >59)
GFR calc non Af Amer: 98 mL/min/{1.73_m2} (ref >59)
Globulin, Total: 3.1 g/dL (ref 1.5–4.5)
Glucose: 105 mg/dL — ABNORMAL HIGH (ref 65–99)
Potassium: 6.2 mmol/L (ref 3.5–5.2)
Sodium: 138 mmol/L (ref 134–144)
Total Bilirubin: 0.3 mg/dL (ref 0.0–1.2)
Total Protein: 7.1 g/dL (ref 6.0–8.5)

## 2013-11-20 LAB — PSA, TOTAL AND FREE
PSA, Free Pct: 43.3 %
PSA, Free: 0.13 ng/mL
PSA: 0.3 ng/mL (ref 0.0–4.0)

## 2013-11-21 LAB — POTASSIUM: Potassium: 5.7 mmol/L — ABNORMAL HIGH (ref 3.5–5.2)

## 2013-11-25 ENCOUNTER — Other Ambulatory Visit: Payer: Self-pay | Admitting: Family Medicine

## 2013-11-27 ENCOUNTER — Telehealth: Payer: Self-pay | Admitting: Family Medicine

## 2013-12-25 ENCOUNTER — Encounter: Payer: Self-pay | Admitting: Family Medicine

## 2013-12-25 ENCOUNTER — Ambulatory Visit (INDEPENDENT_AMBULATORY_CARE_PROVIDER_SITE_OTHER): Payer: Medicare Other | Admitting: Family Medicine

## 2013-12-25 VITALS — BP 144/87 | HR 77 | Temp 98.4°F | Ht 70.0 in | Wt 161.0 lb

## 2013-12-25 DIAGNOSIS — E875 Hyperkalemia: Secondary | ICD-10-CM

## 2013-12-25 NOTE — Progress Notes (Signed)
   Subjective:    Patient ID: Doctor Sheahan, male    DOB: 1950/08/12, 63 y.o.   MRN: [id number]  HPI This 63 y.o. male presents for evaluation of hyperkalemia.  Repeat K shows elevated K of 5.7.  First K was 6.2.  He has been taking neurontin and meloxicam.  He denies any salt substitutes or excessive dietary K intake.   Review of Systems    No chest pain, SOB, HA, dizziness, vision change, N/V, diarrhea, constipation, dysuria, urinary urgency or frequency, myalgias, arthralgias or rash.  Objective:   Physical Exam  Vital signs noted  Well developed well nourished male.  HEENT - Head atraumatic Normocephalic                Eyes - PERRLA, Conjuctiva - clear Sclera- Clear EOMI                Ears - EAC's Wnl TM's Wnl Gross Hearing WNL                Nose - Nares patent                 Throat - oropharanx wnl Respiratory - Lungs CTA bilateral Cardiac - RRR S1 and S2 without murmur GI - Abdomen soft Nontender and bowel sounds active x 4 Extremities - No edema. Neuro - Grossly intact.      Assessment & Plan:  Hyperkalemia - Plan: BMP8+EGFR Stop neurontin and meloxicam and recheck bmp in 4 weeks.  Lysbeth Penner FNP

## 2014-01-07 ENCOUNTER — Other Ambulatory Visit (INDEPENDENT_AMBULATORY_CARE_PROVIDER_SITE_OTHER): Payer: Medicare Other

## 2014-01-07 DIAGNOSIS — E875 Hyperkalemia: Secondary | ICD-10-CM

## 2014-01-07 NOTE — Progress Notes (Signed)
Lab only 

## 2014-01-08 ENCOUNTER — Ambulatory Visit (INDEPENDENT_AMBULATORY_CARE_PROVIDER_SITE_OTHER): Payer: Medicare Other | Admitting: Family Medicine

## 2014-01-08 VITALS — BP 125/84 | HR 99 | Temp 97.9°F | Ht 70.0 in | Wt 157.6 lb

## 2014-01-08 DIAGNOSIS — E875 Hyperkalemia: Secondary | ICD-10-CM

## 2014-01-08 LAB — BMP8+EGFR
BUN/Creatinine Ratio: 12 (ref 10–22)
BUN: 10 mg/dL (ref 8–27)
CO2: 24 mmol/L (ref 18–29)
Calcium: 9.4 mg/dL (ref 8.6–10.2)
Chloride: 100 mmol/L (ref 97–108)
Creatinine, Ser: 0.84 mg/dL (ref 0.76–1.27)
GFR calc Af Amer: 108 mL/min/{1.73_m2} (ref >59)
GFR calc non Af Amer: 94 mL/min/{1.73_m2} (ref >59)
Glucose: 112 mg/dL — ABNORMAL HIGH (ref 65–99)
Potassium: 4.9 mmol/L (ref 3.5–5.2)
Sodium: 139 mmol/L (ref 134–144)

## 2014-01-08 NOTE — Progress Notes (Signed)
   Subjective:    Patient ID: Cameron Roman, male    DOB: [birth year], 63 y.o.   MRN: [id number]  HPI This 63 y.o. male presents for evaluation of hyperkalemia.  He has been having elevated K levels. He stopped his mobic and is K is 4.9 yesterday..   Review of Systems    No chest pain, SOB, HA, dizziness, vision change, N/V, diarrhea, constipation, dysuria, urinary urgency or frequency, myalgias, arthralgias or rash.  Objective:   Physical Exam  Vital signs noted  Well developed well nourished male.  HEENT - Head atraumatic Normocephalic                Eyes - PERRLA, Conjuctiva - clear Sclera- Clear EOMI                Ears - EAC's Wnl TM's Wnl Gross Hearing WNL                Nose - Nares patent                 Throat - oropharanx wnl Respiratory - Lungs CTA bilateral Cardiac - RRR S1 and S2 without murmur GI - Abdomen soft Nontender and bowel sounds active x 4 Extremities - No edema. Neuro - Grossly intact.      Assessment & Plan:  Hyperkalemia - Hold Meloxicam and continue gabapentin since K is normal off mobic. Use Mobic sparingly since it seems to increase potassium levels and avoid excessive potassium in diet.  Lysbeth Penner FNP

## 2014-01-21 ENCOUNTER — Ambulatory Visit (INDEPENDENT_AMBULATORY_CARE_PROVIDER_SITE_OTHER): Payer: Medicare Other

## 2014-01-21 DIAGNOSIS — Z23 Encounter for immunization: Secondary | ICD-10-CM

## 2014-01-27 ENCOUNTER — Other Ambulatory Visit: Payer: Self-pay | Admitting: Internal Medicine

## 2014-02-18 ENCOUNTER — Encounter: Payer: Self-pay | Admitting: Family Medicine

## 2014-02-18 ENCOUNTER — Ambulatory Visit (INDEPENDENT_AMBULATORY_CARE_PROVIDER_SITE_OTHER): Payer: Medicare Other | Admitting: Family Medicine

## 2014-02-18 VITALS — BP 131/80 | HR 82 | Temp 97.9°F | Ht 70.0 in | Wt 162.6 lb

## 2014-02-18 DIAGNOSIS — E785 Hyperlipidemia, unspecified: Secondary | ICD-10-CM

## 2014-02-18 DIAGNOSIS — R5383 Other fatigue: Secondary | ICD-10-CM

## 2014-02-18 LAB — POCT CBC
Granulocyte percent: 50.7 %G (ref 37–80)
HCT, POC: 44.4 % (ref 43.5–53.7)
Hemoglobin: 14.2 g/dL (ref 14.1–18.1)
Lymph, poc: 4.9 — AB (ref 0.6–3.4)
MCH, POC: 31.5 pg — AB (ref 27–31.2)
MCHC: 31.9 g/dL (ref 31.8–35.4)
MCV: 99 fL — AB (ref 80–97)
MPV: 7.3 fL (ref 0–99.8)
POC Granulocyte: 5.3 (ref 2–6.9)
POC LYMPH PERCENT: 46.9 %L (ref 10–50)
Platelet Count, POC: 364 10*3/uL (ref 142–424)
RBC: 4.5 M/uL — AB (ref 4.69–6.13)
RDW, POC: 13.5 %
WBC: 10.4 10*3/uL — AB (ref 4.6–10.2)

## 2014-02-18 NOTE — Addendum Note (Signed)
Addended by: Earlene Plater on: 02/18/2014 08:30 AM   Modules accepted: SmartSet

## 2014-02-18 NOTE — Progress Notes (Signed)
   Subjective:    Patient ID: Cameron Roman, male    DOB: November 13, 1950, 63 y.o.   MRN: [id number]  HPI Patient is here for follow up appointment  Review of Systems  Constitutional: Negative for fever.  HENT: Negative for ear pain.   Eyes: Negative for discharge.  Respiratory: Negative for cough.   Cardiovascular: Negative for chest pain.  Gastrointestinal: Negative for abdominal distention.  Endocrine: Negative for polyuria.  Genitourinary: Negative for difficulty urinating.  Musculoskeletal: Negative for gait problem and neck pain.  Skin: Negative for color change and rash.  Neurological: Negative for speech difficulty and headaches.  Psychiatric/Behavioral: Negative for agitation.       Objective:    BP 131/80 mmHg  Pulse 82  Temp(Src) 97.9 F (36.6 C) (Oral)  Ht $R'5\' 10"'KB$  (1.778 m)  Wt 162 lb 9.6 oz (73.755 kg)  BMI 23.33 kg/m2 Physical Exam  Constitutional: He is oriented to person, place, and time. He appears well-developed and well-nourished.  HENT:  Head: Normocephalic and atraumatic.  Mouth/Throat: Oropharynx is clear and moist.  Eyes: Pupils are equal, round, and reactive to light.  Neck: Normal range of motion. Neck supple.  Cardiovascular: Normal rate and regular rhythm.   No murmur heard. Pulmonary/Chest: Effort normal and breath sounds normal.  Abdominal: Soft. Bowel sounds are normal. There is no tenderness.  Neurological: He is alert and oriented to person, place, and time.  Skin: Skin is warm and dry.  Psychiatric: He has a normal mood and affect.          Assessment & Plan:     ICD-9-CM ICD-10-CM   1. Hyperlipemia 272.4 E78.5 CMP14+EGFR     Lipid panel  2. Other fatigue 780.79 R53.83 POCT CBC     Return in about 3 months (around 05/20/2014).  Lysbeth Penner FNP

## 2014-02-19 LAB — CMP14+EGFR
ALT: 18 IU/L (ref 0–44)
AST: 28 IU/L (ref 0–40)
Albumin/Globulin Ratio: 1.3 (ref 1.1–2.5)
Albumin: 3.8 g/dL (ref 3.6–4.8)
Alkaline Phosphatase: 100 IU/L (ref 39–117)
BUN/Creatinine Ratio: 10 (ref 10–22)
BUN: 9 mg/dL (ref 8–27)
CO2: 24 mmol/L (ref 18–29)
Calcium: 9.3 mg/dL (ref 8.6–10.2)
Chloride: 102 mmol/L (ref 97–108)
Creatinine, Ser: 0.89 mg/dL (ref 0.76–1.27)
GFR calc Af Amer: 106 mL/min/{1.73_m2} (ref >59)
GFR calc non Af Amer: 92 mL/min/{1.73_m2} (ref >59)
Globulin, Total: 2.9 g/dL (ref 1.5–4.5)
Glucose: 90 mg/dL (ref 65–99)
Potassium: 4.6 mmol/L (ref 3.5–5.2)
Sodium: 140 mmol/L (ref 134–144)
Total Bilirubin: 0.3 mg/dL (ref 0.0–1.2)
Total Protein: 6.7 g/dL (ref 6.0–8.5)

## 2014-02-19 LAB — LIPID PANEL
Chol/HDL Ratio: 3.7 ratio units (ref 0.0–5.0)
Cholesterol, Total: 137 mg/dL (ref 100–199)
HDL: 37 mg/dL — ABNORMAL LOW (ref >39)
LDL Calculated: 66 mg/dL (ref 0–99)
Triglycerides: 168 mg/dL — ABNORMAL HIGH (ref 0–149)
VLDL Cholesterol Cal: 34 mg/dL (ref 5–40)

## 2014-06-06 ENCOUNTER — Other Ambulatory Visit: Payer: Self-pay | Admitting: Internal Medicine

## 2014-06-09 ENCOUNTER — Telehealth: Payer: Self-pay

## 2014-06-09 NOTE — Telephone Encounter (Signed)
Refilled Omeprazole 

## 2014-06-09 NOTE — Telephone Encounter (Signed)
Erroneous encounter

## 2014-06-30 DIAGNOSIS — M545 Low back pain: Secondary | ICD-10-CM | POA: Diagnosis not present

## 2014-06-30 DIAGNOSIS — M5416 Radiculopathy, lumbar region: Secondary | ICD-10-CM | POA: Diagnosis not present

## 2014-06-30 DIAGNOSIS — R030 Elevated blood-pressure reading, without diagnosis of hypertension: Secondary | ICD-10-CM | POA: Diagnosis not present

## 2014-06-30 DIAGNOSIS — M542 Cervicalgia: Secondary | ICD-10-CM | POA: Diagnosis not present

## 2014-10-03 ENCOUNTER — Telehealth: Payer: Self-pay

## 2014-10-03 MED ORDER — OMEPRAZOLE 20 MG PO CPDR: DELAYED_RELEASE_CAPSULE | ORAL | Status: DC

## 2014-10-03 NOTE — Telephone Encounter (Signed)
Refilled Omeprazole 

## 2014-10-21 ENCOUNTER — Encounter: Payer: Self-pay | Admitting: Family Medicine

## 2014-10-21 ENCOUNTER — Ambulatory Visit (INDEPENDENT_AMBULATORY_CARE_PROVIDER_SITE_OTHER): Payer: Medicare Other | Admitting: Family Medicine

## 2014-10-21 VITALS — BP 149/93 | HR 67 | Temp 97.1°F | Ht 70.0 in | Wt 162.4 lb

## 2014-10-21 DIAGNOSIS — I10 Essential (primary) hypertension: Secondary | ICD-10-CM | POA: Diagnosis not present

## 2014-10-21 DIAGNOSIS — R7309 Other abnormal glucose: Secondary | ICD-10-CM | POA: Diagnosis not present

## 2014-10-21 DIAGNOSIS — R7303 Prediabetes: Secondary | ICD-10-CM

## 2014-10-21 DIAGNOSIS — N529 Male erectile dysfunction, unspecified: Secondary | ICD-10-CM | POA: Insufficient documentation

## 2014-10-21 LAB — POCT GLYCOSYLATED HEMOGLOBIN (HGB A1C): HEMOGLOBIN A1C: 5.5

## 2014-10-21 MED ORDER — LISINOPRIL 10 MG PO TABS
10.0000 mg | ORAL_TABLET | Freq: Every day | ORAL | Status: DC
Start: 2014-10-21 — End: 2015-03-02

## 2014-10-21 MED ORDER — SILDENAFIL CITRATE 20 MG PO TABS: ORAL_TABLET | ORAL | Status: DC

## 2014-10-21 NOTE — Assessment & Plan Note (Addendum)
stable Watching diet A1C

## 2014-10-21 NOTE — Assessment & Plan Note (Addendum)
new problem but longstanding, concerned about side effects of viagra Difficulty maintaining erection Discussed potential harms and cautions Trial of revatio, dosed for ED

## 2014-10-21 NOTE — Patient Instructions (Signed)
Great to meet you!  Come back in 1 month, keep track of a few blood pressure reading between now and then, try for about 10 total.   WIth this new medicine: You have a lip or tongue swelling stop the medication immediately and seek medical attention if you're having trouble breathing. Otherwise he can let us know. If you have a dehydrating illness, such as the stomach flu, stop the medication for 3 days and then resume when you're better.

## 2014-10-21 NOTE — Progress Notes (Addendum)
Patient ID: Cameron Roman, male   DOB: 11/30/1950, 64 y.o.   MRN: [id number]   HPI  Patient presents today for annual physical and to review chronic medical problems  Prediabetes States that it's managed by his diet, he never had official diagnosis of diabetes No medications  Hypertension He's never had a formal diagnosis of hypertension, his blood pressures generally okay at home has been high at almost every check a doctor's office recently he states. We have a long discussion about keeping a close log and returning a month or starting medications and he would like to start medications at this time. He denies chest pain, dyspnea, palpitations, leg edema, and headache.  Erectile dysfunction His long-standing history of difficulty maintaining erections, this is been going on for several months. He's never discussed it over top about it because he is worried about side effects of Viagra, namely decreased vision with priapism Is also cost prohibitive After a long discussion we have decided to try a trial of Revatio   PMH: Smoking status noted-former smoker ROS: Per HPI, otherwise negative   Objective: BP 149/93 mmHg  Pulse 67  Temp(Src) 97.1 F (36.2 C) (Oral)  Ht 5' 10"  (1.778 m)  Wt 162 lb 6.4 oz (73.664 kg)  BMI 23.30 kg/m2 Gen: NAD, alert, cooperative with exam HEENT: NCAT, sclera white, TM perforated on L, WNL on r, nares clear, oropharynx clear Neck: No thyromegaly, supple CV: RRR, good S1/S2, no murmur Resp: CTABL, no wheezes, non-labored Abd: SNTND, BS present, no guarding or organomegaly Ext: No edema, warm Neuro: Alert and oriented, No gross deficits Skin: No rash or apparent lesions  Repeat manual blood pressure checked by myself confirms elevated blood pressure, 142/88 on my check  Assessment and plan:  Prediabetes stable Watching diet A1C  HTN (hypertension) New Dx Mild HTN Start Acei with pre-diabetes, risk of hyperkalemia so repeat labs 1  month Encourage log  Erectile dysfunction new problem but longstanding, concerned about side effects of viagra Difficulty maintaining erection Discussed potential harms and cautions Trial of revatio, dosed for ED    Orders Placed This Encounter  Procedures  . CBC with Differential  . CMP14+EGFR  . POCT glycosylated hemoglobin (Hb A1C)    Meds ordered this encounter  Medications  . lisinopril (PRINIVIL,ZESTRIL) 10 MG tablet    Sig: Take 1 tablet (10 mg total) by mouth daily.    Dispense:  30 tablet    Refill:  3  . sildenafil (REVATIO) 20 MG tablet    Sig: Take 2 to 4 pills as needed    Dispense:  20 tablet    Refill:  0

## 2014-10-21 NOTE — Assessment & Plan Note (Signed)
New Dx Mild HTN Start Acei with pre-diabetes, risk of hyperkalemia so repeat labs 1 month Encourage log

## 2014-10-22 LAB — CBC WITH DIFFERENTIAL/PLATELET
Basophils Absolute: 0.1 10*3/uL (ref 0.0–0.2)
Basos: 1 %
EOS (ABSOLUTE): 0.3 10*3/uL (ref 0.0–0.4)
EOS: 3 %
Hematocrit: 43.2 % (ref 37.5–51.0)
Hemoglobin: 14.8 g/dL (ref 12.6–17.7)
IMMATURE GRANULOCYTES: 0 %
Immature Grans (Abs): 0 10*3/uL (ref 0.0–0.1)
Lymphocytes Absolute: 4.3 10*3/uL — ABNORMAL HIGH (ref 0.7–3.1)
Lymphs: 39 %
MCH: 33.3 pg — ABNORMAL HIGH (ref 26.6–33.0)
MCHC: 34.3 g/dL (ref 31.5–35.7)
MCV: 97 fL (ref 79–97)
MONOCYTES: 13 %
MONOS ABS: 1.4 10*3/uL — AB (ref 0.1–0.9)
Neutrophils Absolute: 4.9 10*3/uL (ref 1.4–7.0)
Neutrophils: 44 %
PLATELETS: 344 10*3/uL (ref 150–379)
RBC: 4.45 x10E6/uL (ref 4.14–5.80)
RDW: 14.6 % (ref 12.3–15.4)
WBC: 11 10*3/uL — AB (ref 3.4–10.8)

## 2014-10-22 LAB — CMP14+EGFR
A/G RATIO: 1.3 (ref 1.1–2.5)
ALT: 25 IU/L (ref 0–44)
AST: 32 IU/L (ref 0–40)
Albumin: 4 g/dL (ref 3.6–4.8)
Alkaline Phosphatase: 99 IU/L (ref 39–117)
BUN/Creatinine Ratio: 14 (ref 10–22)
BUN: 11 mg/dL (ref 8–27)
Bilirubin Total: 0.4 mg/dL (ref 0.0–1.2)
CO2: 25 mmol/L (ref 18–29)
Calcium: 9.4 mg/dL (ref 8.6–10.2)
Chloride: 100 mmol/L (ref 97–108)
Creatinine, Ser: 0.81 mg/dL (ref 0.76–1.27)
GFR calc Af Amer: 110 mL/min/{1.73_m2} (ref >59)
GFR, EST NON AFRICAN AMERICAN: 95 mL/min/{1.73_m2} (ref >59)
GLUCOSE: 90 mg/dL (ref 65–99)
Globulin, Total: 3.1 g/dL (ref 1.5–4.5)
Potassium: 5 mmol/L (ref 3.5–5.2)
Sodium: 139 mmol/L (ref 134–144)
TOTAL PROTEIN: 7.1 g/dL (ref 6.0–8.5)

## 2014-11-25 ENCOUNTER — Encounter: Payer: Self-pay | Admitting: Family Medicine

## 2014-11-25 ENCOUNTER — Ambulatory Visit (INDEPENDENT_AMBULATORY_CARE_PROVIDER_SITE_OTHER): Payer: Medicare Other | Admitting: Family Medicine

## 2014-11-25 VITALS — BP 138/86 | HR 67 | Temp 97.2°F | Ht 70.0 in | Wt 158.4 lb

## 2014-11-25 DIAGNOSIS — E875 Hyperkalemia: Secondary | ICD-10-CM

## 2014-11-25 DIAGNOSIS — I10 Essential (primary) hypertension: Secondary | ICD-10-CM | POA: Diagnosis not present

## 2014-11-25 DIAGNOSIS — R7309 Other abnormal glucose: Secondary | ICD-10-CM

## 2014-11-25 DIAGNOSIS — G8929 Other chronic pain: Secondary | ICD-10-CM

## 2014-11-25 DIAGNOSIS — M542 Cervicalgia: Secondary | ICD-10-CM | POA: Diagnosis not present

## 2014-11-25 DIAGNOSIS — R7303 Prediabetes: Secondary | ICD-10-CM

## 2014-11-25 NOTE — Patient Instructions (Signed)
Great to see you today!  Come back in January, plan to get fasting labs then.   Continue checking your blood pressure, if its consistently over 140/90 please come back sooner.

## 2014-11-25 NOTE — Progress Notes (Signed)
   HPI  Patient presents today for follow-up hypertension, hyperkalemia  Hypertension Started lisinopril, had lightheadedness and low energy for about a week that then normalized. Blood pressures have corrected from around 140s average to 354T average systolic, ranges 625-638 over 70s. No chest pain, dyspnea, palpitations, leg edema.  He's a nonsmoker He does not watch his diet No formal exercise but he is very active around his house maintaining 3 acres of land.  Neck pain Helped a good deal by gabapentin, he states that his previous provider felt this might be the reason that he has high potassium. He does not want to stop this gabapentin.  High potassium No palpitations, previously elevated as high as 6.2  PMH: Smoking status noted ROS: Per HPI  Objective: BP 138/86 mmHg  Pulse 67  Temp(Src) 97.2 F (36.2 C) (Oral)  Ht '5\' 10"'$  (1.778 m)  Wt 158 lb 6.4 oz (71.85 kg)  BMI 22.73 kg/m2 Gen: NAD, alert, cooperative with exam HEENT: NCAT CV: RRR, good S1/S2, no murmur Resp: CTABL, no wheezes, non-labored Ext: No edema, warm Neuro: Alert and oriented, No gross deficits  Assessment and plan:  # hypertension no red flags, improved Continue lisinopril, recheck BMP with the risk of hyperkalemia and his history. Plan to change to HCTZ or amlodipine if potassium   # hyperkalemia 5.0, reasonable but Hx of 6.2 No symptoms Started Ace so will be cautious and re-che ck today Change to diuretic or CCB if needed  #  Neck pain Continue gabapentin, I don't see nay risk of hyperkalemia but a change to lyrica if needed  Orders Placed This Encounter  Procedures  . Basic Metabolic Panel    Meds ordered this encounter  Medications  . omeprazole (PRILOSEC) 20 MG capsule    Sig:     Laroy Apple, MD [facility] 11/25/2014, 8:16 AM

## 2014-11-26 LAB — BASIC METABOLIC PANEL
BUN / CREAT RATIO: 11 (ref 10–22)
BUN: 9 mg/dL (ref 8–27)
CHLORIDE: 99 mmol/L (ref 97–108)
CO2: 24 mmol/L (ref 18–29)
Calcium: 9.6 mg/dL (ref 8.6–10.2)
Creatinine, Ser: 0.8 mg/dL (ref 0.76–1.27)
GFR calc Af Amer: 111 mL/min/{1.73_m2} (ref >59)
GFR calc non Af Amer: 96 mL/min/{1.73_m2} (ref >59)
GLUCOSE: 97 mg/dL (ref 65–99)
POTASSIUM: 4.6 mmol/L (ref 3.5–5.2)
Sodium: 138 mmol/L (ref 134–144)

## 2014-12-24 ENCOUNTER — Encounter: Payer: Self-pay | Admitting: *Deleted

## 2015-01-26 DIAGNOSIS — M5481 Occipital neuralgia: Secondary | ICD-10-CM | POA: Diagnosis not present

## 2015-01-26 DIAGNOSIS — M5412 Radiculopathy, cervical region: Secondary | ICD-10-CM | POA: Diagnosis not present

## 2015-01-26 DIAGNOSIS — M542 Cervicalgia: Secondary | ICD-10-CM | POA: Diagnosis not present

## 2015-01-29 ENCOUNTER — Other Ambulatory Visit: Payer: Self-pay | Admitting: Internal Medicine

## 2015-02-11 ENCOUNTER — Telehealth: Payer: Self-pay | Admitting: Family Medicine

## 2015-02-11 NOTE — Telephone Encounter (Signed)
Spoke to pt

## 2015-02-17 ENCOUNTER — Ambulatory Visit (INDEPENDENT_AMBULATORY_CARE_PROVIDER_SITE_OTHER): Payer: Medicare Other

## 2015-02-17 DIAGNOSIS — Z23 Encounter for immunization: Secondary | ICD-10-CM

## 2015-03-02 ENCOUNTER — Other Ambulatory Visit: Payer: Self-pay | Admitting: Family Medicine

## 2015-03-03 ENCOUNTER — Other Ambulatory Visit: Payer: Self-pay | Admitting: Family Medicine

## 2015-03-15 DIAGNOSIS — B1920 Unspecified viral hepatitis C without hepatic coma: Secondary | ICD-10-CM

## 2015-03-15 HISTORY — DX: Unspecified viral hepatitis C without hepatic coma: B19.20

## 2015-03-26 ENCOUNTER — Other Ambulatory Visit: Payer: Self-pay | Admitting: Family Medicine

## 2015-03-26 NOTE — Telephone Encounter (Signed)
Last seen 11/25/14 Dr Wendi Snipes

## 2015-03-27 ENCOUNTER — Encounter: Payer: Self-pay | Admitting: Family Medicine

## 2015-03-27 ENCOUNTER — Ambulatory Visit (INDEPENDENT_AMBULATORY_CARE_PROVIDER_SITE_OTHER): Payer: Medicare Other | Admitting: Family Medicine

## 2015-03-27 VITALS — BP 133/84 | HR 90 | Temp 97.0°F | Ht 70.0 in | Wt 160.8 lb

## 2015-03-27 DIAGNOSIS — N529 Male erectile dysfunction, unspecified: Secondary | ICD-10-CM

## 2015-03-27 DIAGNOSIS — G8929 Other chronic pain: Secondary | ICD-10-CM

## 2015-03-27 DIAGNOSIS — I10 Essential (primary) hypertension: Secondary | ICD-10-CM

## 2015-03-27 DIAGNOSIS — E118 Type 2 diabetes mellitus with unspecified complications: Secondary | ICD-10-CM | POA: Diagnosis not present

## 2015-03-27 DIAGNOSIS — R7303 Prediabetes: Secondary | ICD-10-CM

## 2015-03-27 DIAGNOSIS — E785 Hyperlipidemia, unspecified: Secondary | ICD-10-CM | POA: Diagnosis not present

## 2015-03-27 DIAGNOSIS — M542 Cervicalgia: Secondary | ICD-10-CM

## 2015-03-27 LAB — POCT GLYCOSYLATED HEMOGLOBIN (HGB A1C): Hemoglobin A1C: 5.6

## 2015-03-27 NOTE — Patient Instructions (Signed)
Great to see you!  Be sure to see the eye doctor for diabetic eye exam once a year  Come back in 4 months  Keep up the good work!

## 2015-03-27 NOTE — Progress Notes (Signed)
HPI  Patient presents today today for follow-up for chronic illnesses.  Diabetes, diet-controlled Watching his diet very carefully, and reactive doing aerobic exercise at least twice a week and weight training more often.  Hypertension Good medication compliance No chest pain, dyspnea, palpitations, leg edema.  Postnasal drip Flonase, still has some throat clearing.  Erectile dysfunction Helped by sildenafil.  Left elbow pain. About 6 weeks of left elbow pain after working on car and holding his hand in a very awkward position for a long time. States that only bothers him every once a while Does not want any intervention at this time. Using ice, heat, and over-the-counter medications for pain  PMH: Smoking status noted ROS: Per HPI  Objective: BP 133/84 mmHg  Pulse 90  Temp(Src) 97 F (36.1 C) (Oral)  Ht 5' 10" (1.778 m)  Wt 160 lb 12.8 oz (72.938 kg)  BMI 23.07 kg/m2 Gen: NAD, alert, cooperative with exam HEENT: NCAT, nares with mild swelling, oropharynx clear CV: RRR, good S1/S2, no murmur Resp: CTABL, no wheezes, non-labored Ext: No edema, warm Neuro: Alert and oriented, No gross deficits  Assessment and plan:  # Prediabetes A1c diet-controlled Continue aggressive diet and exercise  # Hypertension Well-controlled No medication changes Labs  # HLD Labs Good diet and exercise  # Tennis elbow Discussed options including elbow strap, ice, heat, and injection if those fail. He would not like to add any additional treatment today   Labs are fasting  Orders Placed This Encounter  Procedures  . Lipid panel  . CMP14+EGFR  . POCT glycosylated hemoglobin (Hb A1C)     Laroy Apple, MD [place] 03/27/2015, 10:32 AM

## 2015-04-03 ENCOUNTER — Other Ambulatory Visit: Payer: Medicare Other

## 2015-04-03 DIAGNOSIS — I10 Essential (primary) hypertension: Secondary | ICD-10-CM | POA: Diagnosis not present

## 2015-04-03 DIAGNOSIS — E785 Hyperlipidemia, unspecified: Secondary | ICD-10-CM | POA: Diagnosis not present

## 2015-04-04 LAB — CMP14+EGFR
A/G RATIO: 1.2 (ref 1.1–2.5)
ALT: 21 IU/L (ref 0–44)
AST: 30 IU/L (ref 0–40)
Albumin: 4 g/dL (ref 3.6–4.8)
Alkaline Phosphatase: 90 IU/L (ref 39–117)
BILIRUBIN TOTAL: 0.6 mg/dL (ref 0.0–1.2)
BUN / CREAT RATIO: 13 (ref 10–22)
BUN: 12 mg/dL (ref 8–27)
CALCIUM: 9.4 mg/dL (ref 8.6–10.2)
CO2: 24 mmol/L (ref 18–29)
CREATININE: 0.9 mg/dL (ref 0.76–1.27)
Chloride: 102 mmol/L (ref 96–106)
GFR, EST AFRICAN AMERICAN: 105 mL/min/{1.73_m2} (ref >59)
GFR, EST NON AFRICAN AMERICAN: 91 mL/min/{1.73_m2} (ref >59)
GLUCOSE: 101 mg/dL — AB (ref 65–99)
Globulin, Total: 3.3 g/dL (ref 1.5–4.5)
Potassium: 4.4 mmol/L (ref 3.5–5.2)
SODIUM: 140 mmol/L (ref 134–144)
TOTAL PROTEIN: 7.3 g/dL (ref 6.0–8.5)

## 2015-04-04 LAB — LIPID PANEL
CHOL/HDL RATIO: 4 ratio (ref 0.0–5.0)
Cholesterol, Total: 156 mg/dL (ref 100–199)
HDL: 39 mg/dL — ABNORMAL LOW (ref >39)
LDL CALC: 81 mg/dL (ref 0–99)
TRIGLYCERIDES: 178 mg/dL — AB (ref 0–149)
VLDL Cholesterol Cal: 36 mg/dL (ref 5–40)

## 2015-05-04 ENCOUNTER — Other Ambulatory Visit: Payer: Self-pay | Admitting: Internal Medicine

## 2015-05-12 ENCOUNTER — Telehealth: Payer: Self-pay | Admitting: Internal Medicine

## 2015-05-13 MED ORDER — OMEPRAZOLE 20 MG PO CPDR: 20.0000 mg | DELAYED_RELEASE_CAPSULE | Freq: Every day | ORAL | Status: DC

## 2015-05-13 NOTE — Telephone Encounter (Signed)
Refilled Omeprazole 

## 2015-05-15 ENCOUNTER — Telehealth: Payer: Self-pay

## 2015-05-15 NOTE — Telephone Encounter (Signed)
Omeprazole filled 05/12/2015

## 2015-05-20 ENCOUNTER — Encounter: Payer: Self-pay | Admitting: Family Medicine

## 2015-05-20 ENCOUNTER — Ambulatory Visit (INDEPENDENT_AMBULATORY_CARE_PROVIDER_SITE_OTHER): Payer: Medicare Other | Admitting: Family Medicine

## 2015-05-20 VITALS — BP 114/75 | HR 91 | Temp 98.1°F | Ht 70.0 in | Wt 163.4 lb

## 2015-05-20 DIAGNOSIS — G8929 Other chronic pain: Secondary | ICD-10-CM | POA: Diagnosis not present

## 2015-05-20 DIAGNOSIS — M542 Cervicalgia: Secondary | ICD-10-CM | POA: Diagnosis not present

## 2015-05-20 MED ORDER — KETOROLAC TROMETHAMINE 60 MG/2ML IM SOLN
60.0000 mg | Freq: Once | INTRAMUSCULAR | Status: AC
  Administered 2015-05-20: 60 mg via INTRAMUSCULAR

## 2015-05-20 NOTE — Progress Notes (Signed)
Primary Physician: Kenn File, MD  Chief Complaint: 65 year old gentleman with a 4 day history of headache. Headache is occipital. There is no associated nausea or vomiting. He is status post fusion of cervical vertebrae. He denies any radiculopathy. There is been no recent injury or trauma. Noise and light did not affect him.     Past Medical History  Diagnosis Date  . Hearing loss   . 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 Cough     hx smoking  . Headache(784.0)     headaches r/t cervical issues  . Arthritis   . Chronic back pain     buldging disc  . Chronic neck pain     ruptured disc  . GERD (gastroesophageal reflux disease)     takes Prilosec daily  . H/O hiatal hernia   . History of colon polyps   . Diabetes mellitus     doesn't take anything;diet and exercise controlled  . Insomnia     takes Elavil nightly  . Diverticulosis   . Esophageal stricture      Home Meds: Prior to Admission medications   Medication Sig Start Date End Date Taking? Authorizing Provider  amitriptyline (ELAVIL) 50 MG tablet Take 50 mg by mouth at bedtime.    Yes Historical Provider, MD  fluticasone (FLONASE) 50 MCG/ACT nasal spray Place 2 sprays into both nostrils daily. 03/27/13  Yes Vernie Shanks, MD  gabapentin (NEURONTIN) 300 MG capsule Take 1 capsule by mouth Three times a day. 11/15/11  Yes Historical Provider, MD  HYDROcodone-acetaminophen (NORCO) 10-325 MG tablet Take 1 tablet by mouth 3 (three) times daily. 05/15/15  Yes Historical Provider, MD  lisinopril (PRINIVIL,ZESTRIL) 10 MG tablet TAKE 1 TABLET DAILY 03/03/15  Yes Claretta Fraise, MD  Omega-3 Fatty Acids (FISH OIL) 1000 MG CAPS Take 2,000 mg by mouth 2 (two) times daily.   Yes Historical Provider, MD  omeprazole (PRILOSEC) 20 MG capsule Take 1 capsule (20 mg total) by mouth daily. 05/13/15  Yes Irene Shipper, MD  sildenafil (REVATIO) 20 MG tablet TAKE 2 TO 4 TABLETS AS NEEDED 03/26/15  Yes Timmothy Euler, MD    Allergies:   Allergies  Allergen Reactions  . Percocet [Oxycodone-Acetaminophen] Other (See Comments)    Hallucinations, inability to sleep.    Social History   Social History  . Marital Status: Married    Spouse Name: N/A  . Number of Children: 2  . Years of Education: N/A   Occupational History  . disabled    Social History Main Topics  . Smoking status: Former Smoker -- 1.00 packs/day for 41 years    Types: Cigarettes    Quit date: 06/29/2002  . Smokeless tobacco: Never Used     Comment: quit 33yr ago  . Alcohol Use: No     Comment: occasional less than one a week  . Drug Use: No  . Sexual Activity: Yes   Other Topics Concern  . Not on file   Social History Narrative     Review of Systems: Constitutional: negative for chills, fever, night sweats, weight changes, or fatigue  HEENT: negative for vision changes, hearing loss, congestion, rhinorrhea, ST, epistaxis, or sinus pressure Cardiovascular: negative for chest pain or palpitations Respiratory: negative for hemoptysis, wheezing, shortness of breath, or cough Abdominal: negative for abdominal pain, nausea, vomiting, diarrhea, or constipation Dermatological: negative for rash Neurologic:  headache,no dizziness, or syncope All other systems reviewed and are otherwise negative with the exception to those above and  in the HPI.   Physical Exam: Blood pressure 114/75, pulse 91, temperature 98.1 F (36.7 C), temperature source Oral, height '5\' 10"'$  (1.778 m), weight 163 lb 6.4 oz (74.118 kg)., Body mass index is 23.45 kg/(m^2). General: Well developed, well nourished, in no acute distress. Head: Normocephalic, atraumatic, eyes without discharge, sclera non-icteric, nares are without discharge. Bilateral auditory canals clear, TM's are without perforation, pearly grey and translucent with reflective cone of light bilaterally. Oral cavity moist, posterior pharynx without exudate, erythema, peritonsillar abscess, or post nasal  drip.  Neck: Supple. Some decrease in flexion consistent with fusion surgery No thyromegaly. Full ROM. No lymphadenopathy. Lungs: Clear bilaterally to auscultation without wheezes, rales, or rhonchi. Breathing is unlabored. Heart: RRR with S1 S2. No murmurs, rubs, or gallops appreciated. A Neuro: Alert and oriented X 3. Moves all extremities spontaneously. Gait is normal. CNII-XII grossly in tact. Psych:  Responds to questions appropriately with a normal affect.   Labs: none   ASSESSMENT AND PLAN:   1. Neck pain, chronic Pain seems to be localized and the superior aspect of the trapezius muscles. Patient takes hydrocodone from Dr. Vertell Limber, his neurosurgeon, for chronic pain. Will try Toradol 60 mg IM.  Wardell Honour MD   05/20/2015 11:33 AM

## 2015-05-20 NOTE — Patient Instructions (Signed)
Thank you for allowing us to care for you today. We strive to provide exceptional quality and compassionate care. Please let us know how we are doing and how we can help serve you better by filling out the survey that you receive from [employer].     

## 2015-06-01 ENCOUNTER — Other Ambulatory Visit: Payer: Self-pay | Admitting: Family Medicine

## 2015-06-12 ENCOUNTER — Ambulatory Visit: Payer: Self-pay | Admitting: Internal Medicine

## 2015-06-30 ENCOUNTER — Encounter: Payer: Self-pay | Admitting: Internal Medicine

## 2015-06-30 ENCOUNTER — Ambulatory Visit (INDEPENDENT_AMBULATORY_CARE_PROVIDER_SITE_OTHER): Payer: Medicare Other | Admitting: Internal Medicine

## 2015-06-30 VITALS — BP 90/62 | HR 80 | Ht 70.0 in | Wt 163.2 lb

## 2015-06-30 DIAGNOSIS — Z86010 Personal history of colonic polyps: Secondary | ICD-10-CM | POA: Diagnosis not present

## 2015-06-30 DIAGNOSIS — K222 Esophageal obstruction: Secondary | ICD-10-CM

## 2015-06-30 DIAGNOSIS — K219 Gastro-esophageal reflux disease without esophagitis: Secondary | ICD-10-CM | POA: Diagnosis not present

## 2015-06-30 MED ORDER — OMEPRAZOLE 20 MG PO CPDR: 20.0000 mg | DELAYED_RELEASE_CAPSULE | Freq: Every day | ORAL | Status: DC

## 2015-06-30 NOTE — Progress Notes (Signed)
HISTORY OF PRESENT ILLNESS:  Cameron Roman is a 65 y.o. male with a history of degenerative spine disease for which she is undergone neck and back surgeries, GERD complicated by peptic stricture requiring esophageal dilation, adenomatous colon polyps, diverticulosis, and family history of colon cancer. The patient presents today for follow-up regarding management of his chronic GERD. He is accompanied by his wife. He last underwent upper endoscopy with esophageal dilation October 2011. He has since been maintained on omeprazole 20 mg daily. On medication he has had good control of reflux symptoms. Off medication reflux symptoms returned within 2 days. He does have some minor dysphagia which she does not consider problematic or requiring dilation. No lower GI complaints. Last surveillance colonoscopy October 2013. Diminutive adenoma. Follow-up in 5 years recommended. He does request a refill of his omeprazole daily  REVIEW OF SYSTEMS:  All non-GI ROS negative except for arthritis, back pain  Past Medical History  Diagnosis Date  . Hearing loss   . 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 Cough     hx smoking  . Headache(784.0)     headaches r/t cervical issues  . Arthritis   . Chronic back pain     buldging disc  . Chronic neck pain     ruptured disc  . GERD (gastroesophageal reflux disease)     takes Prilosec daily  . H/O hiatal hernia   . History of colon polyps   . Diabetes mellitus     doesn't take anything;diet and exercise controlled  . Insomnia     takes Elavil nightly  . Diverticulosis   . Esophageal stricture   . Hiatal hernia     Past Surgical History  Procedure Laterality Date  . Hand surgery Right 2005    right  . Lower back surgery  2009    had plates and screws inserted  . Neck surgery  2009    had insertion of  plates and screws  . Inner ear surgery Bilateral     bil;titanium in both ears  . Appendectomy      at age 88  . Colonoscopy    . Cardiac  catheterization  06/03/04  . Anterior cervical decomp/discectomy fusion  04/12/2011    Procedure: ANTERIOR CERVICAL DECOMPRESSION/DISCECTOMY FUSION 2 LEVELS;  Surgeon: Peggyann Shoals, MD;  Location: [facility];  Service: Neurosurgery;  Laterality: N/A;  exploration of Cervical four - seven  fusion with redo Cervical six- seven, cervical three- four anterior cervical decompression with fusion interbody prothesis plating and bonegraft and C34 anterior cervical decompression with inte    Social History Caedyn Raygoza  reports that he quit smoking about 13 years ago. His smoking use included Cigarettes. He has a 41 pack-year smoking history. He has never used smokeless tobacco. He reports that he does not drink alcohol or use illicit drugs.  family history includes Colon cancer in his father, maternal grandfather, and maternal grandmother; Heart disease in his maternal uncle; Stroke in his maternal uncle. There is no history of Anesthesia problems, Hypotension, Malignant hyperthermia, or Pseudochol deficiency.  Allergies  Allergen Reactions  . Percocet [Oxycodone-Acetaminophen] Other (See Comments)    Hallucinations, inability to sleep.       PHYSICAL EXAMINATION: Vital signs: BP 90/62 mmHg  Pulse 80  Ht '5\' 10"'$  (1.778 m)  Wt 163 lb 3.2 oz (74.027 kg)  BMI 23.42 kg/m2  Constitutional: generally well-appearing, no acute distress Psychiatric: alert and oriented x3, cooperative Eyes: extraocular movements intact, anicteric, conjunctiva pink  Mouth: oral pharynx moist, no lesions Neck: supple no lymphadenopathy Cardiovascular: heart regular rate and rhythm, no murmur Lungs: clear to auscultation bilaterally Abdomen: soft, nontender, nondistended, no obvious ascites, no peritoneal signs, normal bowel sounds, no organomegaly Rectal:Omitted Extremities: no clubbing cyanosis or lower extremity edema bilaterally Skin: no lesions on visible extremities Neuro: No focal deficits. Cranial nerves  intact  ASSESSMENT:  #1. GERD, complicated by peptic stricture. No active GERD symptoms on omeprazole. Trivial dysphagia post dilation #2. History of adenomatous colon polyps and family history of colon cancer. Last colonoscopy October 2013   PLAN:  #1. Reflux precautions #2. Refill omeprazole 20 mg daily #3. Routine office follow-up in 2 years. Contact the office in the interim for the development of significant dysphagia #4. Surveillance colonoscopy around October 2018 #5. Ongoing general medical care with PCP  15 minutes was spent face-to-face with the patient. Greater than 50% a time use for counseling regarding his GERD, peptic stricture, and surveillance colonoscopy timing

## 2015-06-30 NOTE — Patient Instructions (Signed)
We have sent the following medications to your pharmacy for you to pick up at your convenience:  Omeprazole  If you are age 64 or older, your body mass index should be between 23-30. Your Body mass index is 23.42 kg/(m^2). If this is out of the aforementioned range listed, please consider follow up with your Primary Care Provider.  If you are age 52 or younger, your body mass index should be between 19-25. Your Body mass index is 23.42 kg/(m^2). If this is out of the aformentioned range listed, please consider follow up with your Primary Care Provider.     Please follow up in 2 years

## 2015-07-27 ENCOUNTER — Ambulatory Visit (INDEPENDENT_AMBULATORY_CARE_PROVIDER_SITE_OTHER): Payer: Medicare Other | Admitting: Family Medicine

## 2015-07-27 ENCOUNTER — Encounter: Payer: Self-pay | Admitting: Family Medicine

## 2015-07-27 ENCOUNTER — Ambulatory Visit: Payer: Medicare Other | Admitting: Family Medicine

## 2015-07-27 VITALS — BP 112/70 | HR 77 | Temp 97.4°F | Ht 70.0 in | Wt 165.2 lb

## 2015-07-27 DIAGNOSIS — R51 Headache: Secondary | ICD-10-CM | POA: Diagnosis not present

## 2015-07-27 DIAGNOSIS — R7303 Prediabetes: Secondary | ICD-10-CM

## 2015-07-27 DIAGNOSIS — I10 Essential (primary) hypertension: Secondary | ICD-10-CM

## 2015-07-27 DIAGNOSIS — R519 Headache, unspecified: Secondary | ICD-10-CM

## 2015-07-27 LAB — BAYER DCA HB A1C WAIVED: HB A1C: 5.8 % (ref <7.0)

## 2015-07-27 MED ORDER — LISINOPRIL 5 MG PO TABS: 5.0000 mg | ORAL_TABLET | Freq: Every day | ORAL | Status: DC

## 2015-07-27 NOTE — Patient Instructions (Addendum)
Great to see you!  I am sending a referral to the headache clinic.   Decrease your lisinopri to 5 mg (1/2 pill until you run out is ok)  Your A1C (the diabetes test) looks very good, keep up the good work!  Come back in 4 months unless you need [place] sooner.   Start a baby aspirin a day

## 2015-07-27 NOTE — Progress Notes (Signed)
   HPI  Patient presents today here for follow-up with headache.  Patient has had headaches almost daily over the last 2-3 months. He describes it as a occipital and radiating to the left orbital area throbbing type headache that last 6-8 hours at a time. No phonophobia, photophobia, or nausea or vomiting.  However he does have occasional 30-60 seconds episodes of loss of vision of his left eye during the headaches. He describes the vision loss as a curtain coming in from the side with complete loss of left-sided vision that comes back in 30-60 seconds. He denies any focal weakness with these, he also denies any trouble speaking or trouble smiling.  He does not take a daily aspirin.  He's been very compliant with lisinopril No chest pain, dyspnea, palpitations, leg edema His average blood pressure is been in the 90s and low 100s, he states that he feels very sluggish whenever that is the case.  Prediabetes Liberated diet a little bit recently, but overall is watching his diet very closely.   PMH: Smoking status noted ROS: Per HPI  Objective: BP 112/70 mmHg  Pulse 77  Temp(Src) 97.4 F (36.3 C) (Oral)  Ht '5\' 10"'$  (1.778 m)  Wt 165 lb 3.2 oz (74.934 kg)  BMI 23.70 kg/m2 Gen: NAD, alert, cooperative with exam HEENT: NCAT, EOMI, PERRL, right TM normal, left TM with chronic perforation CV: RRR, good S1/S2, no murmur Resp: CTABL, no wheezes, non-labored Ext: No edema, warm Neuro: Alert and oriented, strength 5/5 and sensation intact in all 4 extremities, normal gait, cranial nerves II through XII intact.   Assessment and plan:  # Headache- new problem Unclear etiology, medication overuse versus migraine headache with ocular manifestations versus amaurosis fugax Referring to headache clinic/neurology Start daily baby aspirin Discussed that this could be early signs of a stroke   # Hypertension Slightly overtreated Back off of lisinopril to 5 mg Repeat labs today  #  Prediabetes Good diet compliance A1c 5.8     Orders Placed This Encounter  Procedures  . Bayer DCA Hb A1c Waived  . AMB referral to headache clinic    Referral Priority:  Routine    Referral Type:  Consultation    Number of Visits Requested:  1    Meds ordered this encounter  Medications  . lisinopril (PRINIVIL,ZESTRIL) 5 MG tablet    Sig: Take 1 tablet (5 mg total) by mouth daily.    Dispense:  90 tablet    Refill:  [place], MD [facility] 07/27/2015, 3:39 PM

## 2015-07-28 LAB — BMP8+EGFR
BUN/Creatinine Ratio: 11 (ref 10–24)
BUN: 9 mg/dL (ref 8–27)
CALCIUM: 9.4 mg/dL (ref 8.6–10.2)
CHLORIDE: 100 mmol/L (ref 96–106)
CO2: 25 mmol/L (ref 18–29)
Creatinine, Ser: 0.83 mg/dL (ref 0.76–1.27)
GFR calc non Af Amer: 94 mL/min/{1.73_m2} (ref >59)
GFR, EST AFRICAN AMERICAN: 108 mL/min/{1.73_m2} (ref >59)
Glucose: 87 mg/dL (ref 65–99)
Potassium: 4.5 mmol/L (ref 3.5–5.2)
Sodium: 139 mmol/L (ref 134–144)

## 2015-08-12 ENCOUNTER — Telehealth: Payer: Self-pay | Admitting: Family Medicine

## 2015-09-07 ENCOUNTER — Other Ambulatory Visit (INDEPENDENT_AMBULATORY_CARE_PROVIDER_SITE_OTHER): Payer: Medicare Other

## 2015-09-07 ENCOUNTER — Encounter: Payer: Self-pay | Admitting: Neurology

## 2015-09-07 ENCOUNTER — Ambulatory Visit (INDEPENDENT_AMBULATORY_CARE_PROVIDER_SITE_OTHER): Payer: Medicare Other | Admitting: Neurology

## 2015-09-07 VITALS — BP 110/80 | HR 72 | Ht 70.0 in | Wt 162.1 lb

## 2015-09-07 DIAGNOSIS — G453 Amaurosis fugax: Secondary | ICD-10-CM | POA: Diagnosis not present

## 2015-09-07 DIAGNOSIS — M5481 Occipital neuralgia: Secondary | ICD-10-CM | POA: Diagnosis not present

## 2015-09-07 LAB — SEDIMENTATION RATE: Sed Rate: 15 mm/hr (ref 0–20)

## 2015-09-07 LAB — VITAMIN B12: VITAMIN B 12: 156 pg/mL — AB (ref 211–911)

## 2015-09-07 LAB — C-REACTIVE PROTEIN: CRP: 0.1 mg/dL — AB (ref 0.5–20.0)

## 2015-09-07 MED ORDER — BUPIVACAINE HCL 0.25 % IJ SOLN
2.5000 mL | Freq: Once | INTRAMUSCULAR | Status: AC
  Administered 2015-09-07: 2.5 mL

## 2015-09-07 MED ORDER — TRIAMCINOLONE ACETONIDE 40 MG/ML IJ SUSP
20.0000 mg | Freq: Once | INTRAMUSCULAR | Status: AC
  Administered 2015-09-07: 20 mg via INTRAMUSCULAR

## 2015-09-07 NOTE — Progress Notes (Signed)
[facility] Clinic Note - Initial Visit   Date: 09/07/2015  Cameron Roman MRN: [id number] DOB: 01-Jan-1951   Dear Dr. Kenn File, MD:  Thank you for your kind referral of Cameron Roman for consultation of headaches. Although his history is well known to you, please allow [place] to reiterate it for the purpose of our medical record. The patient was accompanied to the clinic by wife who also provides collateral information.     History of Present Illness: Cameron Roman is a 65 y.o. right-handed Caucasian male with hypertension, hyperlipidemia, GERD, prediabetes, and prior tobacco use (quit 2004) presenting for evaluation of headaches.    Starting late 2016, he began having left sided pressure and throbbing over the base of the neck with occasionally radiation into his scalp.  Pain can occur when at rest or with activity.  It usually occurs daily, lasting up to an hour.  He has tried motrin which helps and has been taking this daily for the past several months.  No phonophobia, photophobia, or nausea or vomiting.  He denies associated numbness/tinging.  He does have chronic neck pain.    Around early 2017, he began having spells of vision loss involving his left eye, lasting 35-45 seconds which has occurred about 4-5 times. He describes it as if a curtain comes over his vision.  He was very worried and scared when it first occurred.  He did not seek emergent medical care.   He denies any eye pain, temporal pain, blurry vision, numbness/tingling, or weakness.  He has not had his eye checked lately.  He does not take daily aspirin.  There is no family history of stroke.  Out-side paper records, electronic medical record, and images have been reviewed where available and summarized as:  Lab Results  Component Value Date   CHOL 156 04/03/2015   HDL 39* 04/03/2015   LDLCALC 81 04/03/2015   TRIG 178* 04/03/2015   CHOLHDL 4.0 04/03/2015   Lab Results  Component Value  Date   HGBA1C 5.6 03/27/2015   CT cervical myelogram 01/31/2011: 1.  ACDF at C4-C5 and C5-C6 with solid arthrodesis and no spinal stenosis. 2.  ACDF at C6-C7 with pseudoarthrosis and evidence of C7 hardware loosening. 3.  Adjacent segment disease at C3-C4 with new mild spinal stenosis. 4.  Some progression of facet and uncovertebral spurring resulting in some increased cervical neural foraminal stenosis as above. 5. See lumbar post myelogram CT findings below.  CT lumbar myelogram 01/31/2011: 1.  Interbody and posterior fusion plus decompression at L3-L4 and L4-L5.  Scant interbody arthrodesis at both levels but no evidence of hardware loosening and no significant stenosis. 2.  No significant change in mild L2-L3 and L5-S1 degenerative findings since 2008. 3.  Chronic left hydroureter of unknown etiology and significance. Perhaps there is a stricture of the distal left ureter (e.g. post inflammatory).  There is left lower pole nephrolithiasis. If there is hematuria or other urologic symptom, recommend follow-up urology consult.  MRI brain wwo contrast 07/24/2007: No acute intracranial findings. Bilateral mastoid fluid, with abnormal enhancement; mastoiditis not excluded; consider further workup as discussed above   MRI lumbar spine wwo contrast 10/16/2006: 1.  Status-post L-3 through L-5 PLIF with anatomic alignment.   2.  There does not appear to be significant worsening of disc pathology above or below the fusion when compared with preoperative myelogram of 04/01/05.   3.  There is no abnormal postcontrast enhancement or paraspinous fluid collection.  Past Medical History  Diagnosis Date  . Hearing loss   . 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 Cough     hx smoking  . Headache(784.0)     headaches r/t cervical issues  . Arthritis   . Chronic back pain     buldging disc  . Chronic neck pain     ruptured disc  . GERD (gastroesophageal reflux disease)     takes Prilosec daily  . H/O  hiatal hernia   . History of colon polyps   . Diabetes mellitus     doesn't take anything;diet and exercise controlled  . Insomnia     takes Elavil nightly  . Diverticulosis   . Esophageal stricture   . Hiatal hernia     Past Surgical History  Procedure Laterality Date  . Hand surgery Right 2005    right  . Lower back surgery  2009    had plates and screws inserted  . Neck surgery  2009    had insertion of  plates and screws  . Inner ear surgery Bilateral     bil;titanium in both ears  . Appendectomy      at age 64  . Colonoscopy    . Cardiac catheterization  06/03/04  . Anterior cervical decomp/discectomy fusion  04/12/2011    Procedure: ANTERIOR CERVICAL DECOMPRESSION/DISCECTOMY FUSION 2 LEVELS;  Surgeon: Peggyann Shoals, MD;  Location: [facility];  Service: Neurosurgery;  Laterality: N/A;  exploration of Cervical four - seven  fusion with redo Cervical six- seven, cervical three- four anterior cervical decompression with fusion interbody prothesis plating and bonegraft and C34 anterior cervical decompression with inte     Medications:  Outpatient Encounter Prescriptions as of 09/07/2015  Medication Sig Note  . amitriptyline (ELAVIL) 50 MG tablet Take 50 mg by mouth at bedtime.    . fluticasone (FLONASE) 50 MCG/ACT nasal spray Place 2 sprays into both nostrils daily.   Marland Kitchen gabapentin (NEURONTIN) 300 MG capsule Take 1 capsule by mouth Three times a day.   Marland Kitchen HYDROcodone-acetaminophen (NORCO) 10-325 MG tablet Take 1 tablet by mouth 3 (three) times daily. 05/20/2015: Received from: External Pharmacy Received Sig:   . lisinopril (PRINIVIL,ZESTRIL) 5 MG tablet Take 1 tablet (5 mg total) by mouth daily.   . Omega-3 Fatty Acids (FISH OIL) 1000 MG CAPS Take 2,000 mg by mouth 2 (two) times daily.   Marland Kitchen omeprazole (PRILOSEC) 20 MG capsule Take 1 capsule (20 mg total) by mouth daily.   . sildenafil (REVATIO) 20 MG tablet TAKE 2 TO 4 TABLETS AS NEEDED    No facility-administered encounter  medications on file as of 09/07/2015.     Allergies:  Allergies  Allergen Reactions  . Percocet [Oxycodone-Acetaminophen] Other (See Comments)    Hallucinations, inability to sleep.    Family History: Family History  Problem Relation Age of Onset  . Colon cancer Father   . Anesthesia problems Neg Hx   . Hypotension Neg Hx   . Malignant hyperthermia Neg Hx   . Pseudochol deficiency Neg Hx   . Colon cancer Maternal Grandmother   . Colon cancer Maternal Grandfather   . Heart disease Maternal Uncle   . Stroke Maternal Uncle   . Other Mother     Perforated bowel  . Cancer Brother   . Healthy Son   . Healthy Daughter     Social History: Social History  Substance Use Topics  . Smoking status: Former Smoker -- 1.00 packs/day for 41 years    Types:  Cigarettes    Quit date: 06/29/2002  . Smokeless tobacco: Never Used     Comment: quit 52yr ago  . Alcohol Use: 0.0 oz/week    0 Standard drinks or equivalent per week     Comment: occasional less than one a week   Social History   Social History Narrative   Lives with wife in a one story home.  Has 2 children.     Currently does not work.  On disability for wrist pain since early 2000s.   Formerly a tAdministrator    Review of Systems:  CONSTITUTIONAL: No fevers, chills, night sweats, or weight loss.   EYES: +visual changes, no eye pain ENT: No hearing changes.  No history of nose bleeds.   RESPIRATORY: No cough, wheezing and shortness of breath.   CARDIOVASCULAR: Negative for chest pain, and palpitations.   GI: Negative for abdominal discomfort, blood in stools or black stools.  No recent change in bowel habits.   GU:  No history of incontinence.   MUSCLOSKELETAL: + history of joint pain or swelling.  No myalgias.   SKIN: Negative for lesions, rash, and itching.   HEMATOLOGY/ONCOLOGY: Negative for prolonged bleeding, bruising easily, and swollen nodes.  No history of cancer.   ENDOCRINE: Negative for cold or heat  intolerance, polydipsia or goiter.   PSYCH:  No depression or anxiety symptoms.   NEURO: As Above.   Vital Signs:  BP 110/80 mmHg  Pulse 72  Ht _0  (1.778 m)  Wt 162 lb 2 oz (73.539 kg)  BMI 23.26 kg/m2  SpO2 98%   General Medical Exam:   General:  Well appearing, comfortable.   Eyes/ENT: see cranial nerve examination.  There is no ropiness or tenderness of the temporal artery.   Neck: No masses appreciated.  Full range of motion without tenderness.  No carotid bruits.  He has point tenderness over the left greater occipital nerve. Respiratory:  Clear to auscultation, good air entry bilaterally.   Cardiac:  Regular rate and rhythm, no murmur.   Extremities:  No deformities, edema, or skin discoloration.  Skin:  No rashes or lesions.  Neurological Exam: MENTAL STATUS including orientation to time, place, person, recent and remote memory, attention span and concentration, language, and fund of knowledge is normal.  Speech is not dysarthric.  CRANIAL NERVES: II:  No visual field defects.  Unremarkable fundi.   III-IV-VI: Pupils equal round and reactive to light.  Normal conjugate, extra-ocular eye movements in all directions of gaze.  No nystagmus.  No ptosis.   V:  Normal facial sensation.    VII:  Normal facial symmetry and movements.   VIII:  Normal hearing and vestibular function.   IX-X:  Normal palatal movement.   XI:  Normal shoulder shrug and head rotation.   XII:  Normal tongue strength and range of motion, no deviation or fasciculation.  MOTOR: Motor strength is 5/5 throughout.   No atrophy, fasciculations or abnormal movements.  No pronator drift.  Tone is normal.    MSRs:   Reflexes are 2+/4 throughout.  Plantars are downgoing.  SENSORY:  Normal and symmetric perception of light touch, pinprick, vibration, and proprioception.  Romberg's sign absent.   COORDINATION/GAIT: Normal finger-to- nose-finger.  Intact rapid alternating movements bilaterally.  Able to rise  from a chair without using arms.  Gait narrow based and stable.   IMPRESSION: 1.  Left occipital neuralgia  - Discussed options of management including localized nerve block vs medication management.    -  He would like to try nerve block today (see procedure note)  - If no improvement with the nerve block within 7-10 days, increase gabapentin to 332m in the morning, 3016min the afternoon, and 60039mt bedtime.  This can be increased further to 600m33mree times daily, if needed.  - Limit motrin, Aleve, tylenol to twice per week  2.  Left transient vision loss, most suggestive of amaurosis fugax.  However, with his headaches and vision symptoms, giant cell arteritis cannot be excluded  - Vascular risk factors:  HTN, HPL (LDL 81), and prediabetes which is well controlled. He has history of tobacco use, quit in 2004   - Start aspirin 81mg55mly  - Check ESR, CRP, vitamin B12  - US caKoreatids  - If his inflammatory markers are elevated or US caKoreatids does not show atherosclerotic disease, next step will be temporal artery biopsy  - Stroke warning signs discussed  Return to clinic in 1 month.   The duration of this appointment visit was 60 minutes of face-to-face time with the patient.  Greater than 50% of this time was spent in counseling, explanation of diagnosis, planning of further management, and coordination of care.   Thank you for allowing me to participate in patient's care.  If I can answer any additional questions, I would be pleased to do so.    Sincerely,    Conswella Bruney K. PatelPosey Pronto

## 2015-09-07 NOTE — Patient Instructions (Addendum)
1.  Start aspirin '81mg'$  daily 2.  Check blood work 3.  US carotids 4.  If you do not experience improvement with the nerve block within 7-10 days, please call my office and we will increase your gabapentin to '300mg'$  in the morning, '300mg'$  in the afternoon, and '600mg'$  at bedtime.  This can be increased further to '600mg'$  three times daily, if needed. 5.  Limit motrin, Aleve, tylenol to twice per week  Return to clinic in 4-6 weeks

## 2015-09-07 NOTE — Procedures (Signed)
Procedure: Occipital nerve block  Procedure risks, benefits and alternative treatments explained to patient and they agree to proceed.   A concentration of 0.25% bupivacaine (2.5 ml) was mixed with 40 mg of Kenalog (0.5 ml). A 27 gauge needle was used for injection. The region of the left greater and lesser occipital nerve was located by palpation. The area was prepped with alcohol. A total of 2 cc of the above mixture was injected without difficulty into the left greater occipital nerve and 1cc was injected into the left lesser occipital nerve. The patient tolerated the procedure well.   Hurschel Paynter K. Posey Pronto, DO

## 2015-09-08 ENCOUNTER — Telehealth: Payer: Self-pay | Admitting: Family Medicine

## 2015-09-08 NOTE — Telephone Encounter (Signed)
Patient aware he may receive b12 injections in our office and appointment scheduled for tomorrow at 9:30am.

## 2015-09-09 ENCOUNTER — Ambulatory Visit (INDEPENDENT_AMBULATORY_CARE_PROVIDER_SITE_OTHER): Payer: Medicare Other | Admitting: *Deleted

## 2015-09-09 DIAGNOSIS — E538 Deficiency of other specified B group vitamins: Secondary | ICD-10-CM

## 2015-09-09 MED ORDER — CYANOCOBALAMIN 1000 MCG/ML IJ SOLN
1000.0000 ug | Freq: Every day | INTRAMUSCULAR | Status: DC
  Administered 2015-09-09 – 2015-09-10 (×2): 1000 ug via INTRAMUSCULAR

## 2015-09-09 NOTE — Progress Notes (Signed)
Pt given Vit B12 inj Pt tolerated well

## 2015-09-10 ENCOUNTER — Ambulatory Visit (INDEPENDENT_AMBULATORY_CARE_PROVIDER_SITE_OTHER): Payer: Medicare Other | Admitting: *Deleted

## 2015-09-10 DIAGNOSIS — E538 Deficiency of other specified B group vitamins: Secondary | ICD-10-CM

## 2015-09-10 NOTE — Patient Instructions (Signed)

## 2015-09-10 NOTE — Progress Notes (Signed)
Vitamin b12 injection given and patient tolerated well.  

## 2015-09-11 ENCOUNTER — Emergency Department (HOSPITAL_COMMUNITY): Payer: Medicare Other

## 2015-09-11 ENCOUNTER — Ambulatory Visit: Payer: Medicare Other

## 2015-09-11 ENCOUNTER — Observation Stay (HOSPITAL_COMMUNITY): Payer: Medicare Other

## 2015-09-11 ENCOUNTER — Inpatient Hospital Stay (HOSPITAL_COMMUNITY)
Admission: EM | Admit: 2015-09-11 | Discharge: 2015-09-14 | DRG: 042 | Disposition: A | Discharge status: Home / Self Care | Payer: Medicare Other | Attending: Internal Medicine | Admitting: Internal Medicine

## 2015-09-11 ENCOUNTER — Telehealth: Payer: Self-pay | Admitting: *Deleted

## 2015-09-11 ENCOUNTER — Encounter (HOSPITAL_COMMUNITY): Payer: Self-pay | Admitting: Physical Medicine and Rehabilitation

## 2015-09-11 DIAGNOSIS — I639 Cerebral infarction, unspecified: Secondary | ICD-10-CM | POA: Diagnosis not present

## 2015-09-11 DIAGNOSIS — Z885 Allergy status to narcotic agent status: Secondary | ICD-10-CM

## 2015-09-11 DIAGNOSIS — Z79899 Other long term (current) drug therapy: Secondary | ICD-10-CM | POA: Diagnosis not present

## 2015-09-11 DIAGNOSIS — D72829 Elevated white blood cell count, unspecified: Secondary | ICD-10-CM | POA: Diagnosis present

## 2015-09-11 DIAGNOSIS — I739 Peripheral vascular disease, unspecified: Secondary | ICD-10-CM | POA: Diagnosis not present

## 2015-09-11 DIAGNOSIS — G8324 Monoplegia of upper limb affecting left nondominant side: Secondary | ICD-10-CM | POA: Diagnosis present

## 2015-09-11 DIAGNOSIS — Z87891 Personal history of nicotine dependence: Secondary | ICD-10-CM | POA: Diagnosis not present

## 2015-09-11 DIAGNOSIS — H9190 Unspecified hearing loss, unspecified ear: Secondary | ICD-10-CM | POA: Diagnosis not present

## 2015-09-11 DIAGNOSIS — Z886 Allergy status to analgesic agent status: Secondary | ICD-10-CM | POA: Diagnosis not present

## 2015-09-11 DIAGNOSIS — G8929 Other chronic pain: Secondary | ICD-10-CM | POA: Diagnosis present

## 2015-09-11 DIAGNOSIS — K219 Gastro-esophageal reflux disease without esophagitis: Secondary | ICD-10-CM | POA: Diagnosis present

## 2015-09-11 DIAGNOSIS — I10 Essential (primary) hypertension: Secondary | ICD-10-CM | POA: Diagnosis not present

## 2015-09-11 DIAGNOSIS — E119 Type 2 diabetes mellitus without complications: Secondary | ICD-10-CM | POA: Diagnosis not present

## 2015-09-11 DIAGNOSIS — Z7982 Long term (current) use of aspirin: Secondary | ICD-10-CM

## 2015-09-11 DIAGNOSIS — M542 Cervicalgia: Secondary | ICD-10-CM | POA: Diagnosis not present

## 2015-09-11 DIAGNOSIS — E538 Deficiency of other specified B group vitamins: Secondary | ICD-10-CM

## 2015-09-11 DIAGNOSIS — Z86010 Personal history of colonic polyps: Secondary | ICD-10-CM | POA: Diagnosis not present

## 2015-09-11 DIAGNOSIS — M549 Dorsalgia, unspecified: Secondary | ICD-10-CM | POA: Diagnosis present

## 2015-09-11 DIAGNOSIS — H5442 Blindness, left eye, normal vision right eye: Secondary | ICD-10-CM | POA: Diagnosis not present

## 2015-09-11 DIAGNOSIS — G4700 Insomnia, unspecified: Secondary | ICD-10-CM | POA: Diagnosis present

## 2015-09-11 DIAGNOSIS — I6789 Other cerebrovascular disease: Secondary | ICD-10-CM | POA: Diagnosis not present

## 2015-09-11 DIAGNOSIS — E785 Hyperlipidemia, unspecified: Secondary | ICD-10-CM | POA: Diagnosis present

## 2015-09-11 DIAGNOSIS — R42 Dizziness and giddiness: Secondary | ICD-10-CM | POA: Diagnosis not present

## 2015-09-11 DIAGNOSIS — M1990 Unspecified osteoarthritis, unspecified site: Secondary | ICD-10-CM | POA: Diagnosis not present

## 2015-09-11 DIAGNOSIS — I6340 Cerebral infarction due to embolism of unspecified cerebral artery: Secondary | ICD-10-CM | POA: Diagnosis not present

## 2015-09-11 LAB — CBC WITH DIFFERENTIAL/PLATELET
BASOS ABS: 0 10*3/uL (ref 0.0–0.1)
BASOS PCT: 0 %
Eosinophils Absolute: 0.3 10*3/uL (ref 0.0–0.7)
Eosinophils Relative: 2 %
HCT: 43.3 % (ref 39.0–52.0)
Hemoglobin: 14.3 g/dL (ref 13.0–17.0)
Lymphocytes Relative: 40 %
Lymphs Abs: 5.2 10*3/uL — ABNORMAL HIGH (ref 0.7–4.0)
MCH: 32.6 pg (ref 26.0–34.0)
MCHC: 33 g/dL (ref 30.0–36.0)
MCV: 98.6 fL (ref 78.0–100.0)
MONO ABS: 1.3 10*3/uL — AB (ref 0.1–1.0)
MONOS PCT: 10 %
NEUTROS ABS: 6.1 10*3/uL (ref 1.7–7.7)
Neutrophils Relative %: 48 %
PLATELETS: 360 10*3/uL (ref 150–400)
RBC: 4.39 MIL/uL (ref 4.22–5.81)
RDW: 14.7 % (ref 11.5–15.5)
WBC: 12.9 10*3/uL — ABNORMAL HIGH (ref 4.0–10.5)

## 2015-09-11 LAB — COMPREHENSIVE METABOLIC PANEL
ALBUMIN: 3.3 g/dL — AB (ref 3.5–5.0)
ALT: 23 U/L (ref 17–63)
ANION GAP: 4 — AB (ref 5–15)
AST: 28 U/L (ref 15–41)
Alkaline Phosphatase: 95 U/L (ref 38–126)
BUN: 14 mg/dL (ref 6–20)
CHLORIDE: 108 mmol/L (ref 101–111)
CO2: 25 mmol/L (ref 22–32)
Calcium: 9.1 mg/dL (ref 8.9–10.3)
Creatinine, Ser: 0.82 mg/dL (ref 0.61–1.24)
GFR calc Af Amer: >60 mL/min (ref >60)
GFR calc non Af Amer: >60 mL/min (ref >60)
GLUCOSE: 100 mg/dL — AB (ref 65–99)
POTASSIUM: 4.6 mmol/L (ref 3.5–5.1)
SODIUM: 137 mmol/L (ref 135–145)
TOTAL PROTEIN: 6.8 g/dL (ref 6.5–8.1)
Total Bilirubin: 0.3 mg/dL (ref 0.3–1.2)

## 2015-09-11 LAB — GLUCOSE, CAPILLARY: GLUCOSE-CAPILLARY: 117 mg/dL — AB (ref 65–99)

## 2015-09-11 MED ORDER — CYANOCOBALAMIN 1000 MCG/ML IJ SOLN
1000.0000 ug | Freq: Every day | INTRAMUSCULAR | Status: DC
  Administered 2015-09-12 – 2015-09-13 (×2): 1000 ug via INTRAMUSCULAR
  Filled 2015-09-11 (×3): qty 1

## 2015-09-11 MED ORDER — AMITRIPTYLINE HCL 25 MG PO TABS
50.0000 mg | ORAL_TABLET | Freq: Every day | ORAL | Status: DC
  Administered 2015-09-12 – 2015-09-13 (×3): 50 mg via ORAL
  Filled 2015-09-11 (×3): qty 2

## 2015-09-11 MED ORDER — GABAPENTIN 300 MG PO CAPS
300.0000 mg | ORAL_CAPSULE | Freq: Three times a day (TID) | ORAL | Status: DC
  Administered 2015-09-12 – 2015-09-13 (×7): 300 mg via ORAL
  Filled 2015-09-11 (×7): qty 1

## 2015-09-11 MED ORDER — ASPIRIN 325 MG PO TABS
325.0000 mg | ORAL_TABLET | Freq: Every day | ORAL | Status: DC
  Administered 2015-09-12 – 2015-09-13 (×2): 325 mg via ORAL
  Filled 2015-09-11 (×2): qty 1

## 2015-09-11 MED ORDER — FISH OIL 1000 MG PO CAPS: 2000.0000 mg | ORAL_CAPSULE | Freq: Two times a day (BID) | ORAL | Status: DC

## 2015-09-11 MED ORDER — ASPIRIN 300 MG RE SUPP: 300.0000 mg | Freq: Every day | RECTAL | Status: DC

## 2015-09-11 MED ORDER — STROKE: EARLY STAGES OF RECOVERY BOOK
Freq: Once | Status: DC
  Filled 2015-09-11: qty 1

## 2015-09-11 MED ORDER — ENOXAPARIN SODIUM 40 MG/0.4ML ~~LOC~~ SOLN
40.0000 mg | Freq: Every day | SUBCUTANEOUS | Status: DC
  Administered 2015-09-12 – 2015-09-13 (×3): 40 mg via SUBCUTANEOUS
  Filled 2015-09-11 (×3): qty 0.4

## 2015-09-11 MED ORDER — HYDROCODONE-ACETAMINOPHEN 10-325 MG PO TABS
1.0000 | ORAL_TABLET | Freq: Three times a day (TID) | ORAL | Status: DC
  Administered 2015-09-12 – 2015-09-13 (×7): 1 via ORAL
  Filled 2015-09-11 (×7): qty 1

## 2015-09-11 MED ORDER — FLUTICASONE PROPIONATE 50 MCG/ACT NA SUSP
2.0000 | Freq: Every day | NASAL | Status: DC
Start: 2015-09-12 — End: 2015-09-14
  Administered 2015-09-12 – 2015-09-13 (×2): 2 via NASAL
  Filled 2015-09-11: qty 16

## 2015-09-11 MED ORDER — INSULIN ASPART 100 UNIT/ML ~~LOC~~ SOLN
0.0000 [IU] | Freq: Three times a day (TID) | SUBCUTANEOUS | Status: DC
  Administered 2015-09-13: 1 [IU] via SUBCUTANEOUS

## 2015-09-11 MED ORDER — SODIUM CHLORIDE 0.9 % IV SOLN
INTRAVENOUS | Status: DC
  Administered 2015-09-12: via INTRAVENOUS

## 2015-09-11 MED ORDER — OMEGA-3-ACID ETHYL ESTERS 1 G PO CAPS
2.0000 g | ORAL_CAPSULE | Freq: Two times a day (BID) | ORAL | Status: DC
  Administered 2015-09-12 – 2015-09-13 (×5): 2 g via ORAL
  Filled 2015-09-11 (×5): qty 2

## 2015-09-11 MED ORDER — LISINOPRIL 5 MG PO TABS
5.0000 mg | ORAL_TABLET | Freq: Every day | ORAL | Status: DC
  Administered 2015-09-12 – 2015-09-13 (×2): 5 mg via ORAL
  Filled 2015-09-11 (×2): qty 1

## 2015-09-11 MED ORDER — PANTOPRAZOLE SODIUM 40 MG PO TBEC
40.0000 mg | DELAYED_RELEASE_TABLET | Freq: Every day | ORAL | Status: DC
  Administered 2015-09-12 – 2015-09-13 (×2): 40 mg via ORAL
  Filled 2015-09-11 (×2): qty 1

## 2015-09-11 NOTE — ED Provider Notes (Signed)
CSN: [id number]     Arrival date & time 09/11/15  1325 History  By signing my name below, I, Rowan Blase, attest that this documentation has been prepared under the direction and in the presence of Varney Biles, MD . Electronically Signed: Rowan Blase, Scribe. 09/11/2015. 4:55 PM.   Chief Complaint  Patient presents with  . Near Syncope    The history is provided by the patient. No language interpreter was used.   HPI Comments:  Cameron Roman is a 65 y.o. male with PMHx of DM, HTN, diverticulosis and cervical fusion who presents to the Emergency Department complaining of near syncopal episode yesterday while standing and picking blackberries. Pt states he felt light-headed and nauseated; he sat down on the ground and symptoms worsened and lasted ~10-15 minutes. He notes peripheral vision on left side was also reduced. 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 Episodes occur 1-2x per week. No exacerbating factors noted. Pt had a nerve block in his neck 4 days ago by Neurologist Dr. Posey Pronto at [facility]; she was concerned for a stroke and referred him to the ED today. Denies syncope, diaphoresis, chest pain, shortness of breath, palpitations, possibility of dehydration, experiencing similar symptoms previously, numbness or tingling.   Past Medical History  Diagnosis Date  . Hearing loss   . 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 Cough     hx smoking  . Headache(784.0)     headaches r/t cervical issues  . Arthritis   . Chronic back pain     buldging disc  . Chronic neck pain     ruptured disc  . GERD (gastroesophageal reflux disease)     takes Prilosec daily  . H/O hiatal hernia   . History of colon polyps   . Diabetes mellitus     doesn't take anything;diet and exercise controlled  . Insomnia     takes Elavil nightly  . Diverticulosis   . Esophageal stricture   . Hiatal hernia    Past Surgical History   Procedure Laterality Date  . Hand surgery Right 2005    right  . Lower back surgery  2009    had plates and screws inserted  . Neck surgery  2009    had insertion of  plates and screws  . Inner ear surgery Bilateral     bil;titanium in both ears  . Appendectomy      at age 23  . Colonoscopy    . Cardiac catheterization  06/03/04  . Anterior cervical decomp/discectomy fusion  04/12/2011    Procedure: ANTERIOR CERVICAL DECOMPRESSION/DISCECTOMY FUSION 2 LEVELS;  Surgeon: Peggyann Shoals, MD;  Location: [facility];  Service: Neurosurgery;  Laterality: N/A;  exploration of Cervical four - seven  fusion with redo Cervical six- seven, cervical three- four anterior cervical decompression with fusion interbody prothesis plating and bonegraft and C34 anterior cervical decompression with inte   Family History  Problem Relation Age of Onset  . Colon cancer Father   . Anesthesia problems Neg Hx   . Hypotension Neg Hx   . Malignant hyperthermia Neg Hx   . Pseudochol deficiency Neg Hx   . Colon cancer Maternal Grandmother   . Colon cancer Maternal Grandfather   . Heart disease Maternal Uncle   . Stroke Maternal Uncle   . Other Mother     Perforated bowel  . Cancer Brother   . Healthy Son   .  Healthy Daughter    Social History  Substance Use Topics  . Smoking status: Former Smoker -- 1.00 packs/day for 41 years    Types: Cigarettes    Quit date: 06/29/2002  . Smokeless tobacco: Never Used     Comment: quit 52yr ago  . Alcohol Use: 0.0 oz/week    0 Standard drinks or equivalent per week     Comment: occasional less than one a week    Review of Systems  Constitutional: Negative for diaphoresis.  Eyes: Positive for visual disturbance.  Respiratory: Negative for shortness of breath.   Cardiovascular: Negative for chest pain.  Gastrointestinal: Positive for nausea.  Neurological: Positive for light-headedness. Negative for syncope and numbness.  All other systems reviewed and are  negative.   Allergies  Percocet  Home Medications   Prior to Admission medications   Medication Sig Start Date End Date Taking? Authorizing Provider  amitriptyline (ELAVIL) 50 MG tablet Take 50 mg by mouth at bedtime.     Historical Provider, MD  fluticasone (FLONASE) 50 MCG/ACT nasal spray Place 2 sprays into both nostrils daily. 03/27/13   FVernie Shanks MD  gabapentin (NEURONTIN) 300 MG capsule Take 1 capsule by mouth Three times a day. 11/15/11   Historical Provider, MD  HYDROcodone-acetaminophen (NORCO) 10-325 MG tablet Take 1 tablet by mouth 3 (three) times daily. 05/15/15   Historical Provider, MD  lisinopril (PRINIVIL,ZESTRIL) 5 MG tablet Take 1 tablet (5 mg total) by mouth daily. 07/27/15   STimmothy Euler MD  Omega-3 Fatty Acids (FISH OIL) 1000 MG CAPS Take 2,000 mg by mouth 2 (two) times daily.    Historical Provider, MD  omeprazole (PRILOSEC) 20 MG capsule Take 1 capsule (20 mg total) by mouth daily. 06/30/15   JIrene Shipper MD  sildenafil (REVATIO) 20 MG tablet TAKE 2 TO 4 TABLETS AS NEEDED 03/26/15   STimmothy Euler MD   BP 163/100 mmHg  Pulse 61  Temp(Src) 98.7 F (37.1 C) (Oral)  Resp 18  Ht '5\' 10"'$  (1.778 m)  Wt 162 lb (73.483 kg)  BMI 23.24 kg/m2  SpO2 97%   Physical Exam  Constitutional: He is oriented to person, place, and time. He appears well-developed and well-nourished.  HENT:  Head: Normocephalic and atraumatic.  Eyes: EOM are normal.  Neck: Normal range of motion.  Cardiovascular: Normal rate, regular rhythm, normal heart sounds and intact distal pulses.   Pulmonary/Chest: Effort normal and breath sounds normal. No respiratory distress.  Abdominal: Soft. He exhibits no distension. There is no tenderness.  Musculoskeletal: Normal range of motion.  Neurological: He is alert and oriented to person, place, and time. No cranial nerve deficit. Coordination normal.  Cranial nerves II-XII intact; motor exam of upper and lower extremities 4+/5; sensory exam  normal  Skin: Skin is warm and dry.  Psychiatric: He has a normal mood and affect. Judgment normal.  Nursing note and vitals reviewed.   ED Course  Procedures   DIAGNOSTIC STUDIES: Oxygen Saturation is 100% on RA, normal by my interpretation.    COORDINATION OF CARE: 4:53 PM Will order MRI. Discussed treatment plan with pt at bedside and pt agreed to plan.   Labs Review Labs Reviewed  CBC WITH DIFFERENTIAL/PLATELET - Abnormal; Notable for the following:    WBC 12.9 (*)    Lymphs Abs 5.2 (*)    Monocytes Absolute 1.3 (*)    All other components within normal limits  COMPREHENSIVE METABOLIC PANEL - Abnormal; Notable for the following:  Glucose, Bld 100 (*)    Albumin 3.3 (*)    Anion gap 4 (*)    All other components within normal limits    Imaging Review Mr Brain Wo Contrast  09/11/2015  CLINICAL DATA:  65 year old hypertensive diabetic male with lightheadedness yesterday with visual changes and dizziness. EXAM: MRI HEAD WITHOUT CONTRAST TECHNIQUE: Multiplanar, multiecho pulse sequences of the brain and surrounding structures were obtained without intravenous contrast. COMPARISON:  07/23/2007 brain MR. FINDINGS: Bilateral cerebellar and occipital lobe nonhemorrhagic infarcts. Major intracranial vascular structures are patent. Scattered punctate and patchy white matter changes most consistent with result of small vessel disease. Mild global atrophy without hydrocephalus. No acute orbital abnormality. Prior surgeries C3-4 level. Bulge C2-3 mild spinal stenosis. Cervical medullary junction unremarkable. Opacification right mastoid air cells without obstructing lesion of the eustachian tube noted. Suggestion of prior left mastoid surgery. IMPRESSION: Bilateral cerebellar and occipital lobe nonhemorrhagic infarcts. Mild chronic microvascular changes. Mild global atrophy. C2-3 bulge mild spinal stenosis. Opacification right mastoid air cells. Suggestion prior left mastoid surgery. These  results were called by telephone at the time of interpretation on 09/11/2015 at 6:54 pm to Dr. Varney Biles , who verbally acknowledged these results. Electronically Signed   By: Genia Del M.D.   On: 09/11/2015 19:10   I have personally reviewed and evaluated these images and lab results as part of my medical decision-making.   EKG Interpretation   Date/Time:  Friday September 11 2015 13:27:18 EDT Ventricular Rate:  61 PR Interval:  174 QRS Duration: 94 QT Interval:  418 QTC Calculation: 420 R Axis:   63 Text Interpretation:  Normal sinus rhythm Normal ECG No acute changes No  significant change since last tracing Nonspecific ST abnormality - likely  early repolarization Reconfirmed by Kathrynn Humble, MD, Thelma Comp [phone]) on  09/11/2015 4:20:34 PM      MDM   Final diagnoses:  Acute ischemic stroke ([place])    I personally performed the services described in this documentation, which was scribed in my presence. The recorded information has been reviewed and is accurate.  Pt sent to the ER with concerns for stroke vs. TIA. PT has been having vision changes on the L side for the past 2-3 months. Episodes occuring once weekly. PT had a near syncope like episode yday. Neuro exam is non focal. Pt's Neurologist, Dr. Posey Pronto concerned for stroke. PT is currently c/o still some L eye peripheral vision abnormalities. MRi brain ordered. If neg, spoke with Dr. Carles Collet, and they will be happy to finish TIA workup.    Varney Biles, MD 09/11/15 1940

## 2015-09-11 NOTE — ED Notes (Signed)
Pt requesting something to drink. OK per RN Freida Busman pt requested Sprite. Pt given the same.

## 2015-09-11 NOTE — Telephone Encounter (Signed)
Patient advised to go to ER per Dr. Posey Pronto.  Patient agreed with plan.

## 2015-09-11 NOTE — H&P (Signed)
History and Physical    Cameron Roman ZOX:[id number] DOB: 16-Jun-1950 DOA: 09/11/2015  PCP: Kenn File, MD  Patient coming from: Home.  Chief Complaint: Dizziness and nausea more than 24 hours ago.  HPI: Cameron Roman is a 65 y.o. male with hypertension was referred to the ER by patient's neurologist after patient had complained of dizziness and nausea 24 hours ago. Patient states he was picking berries on June 29 that was yesterday morning when patient suddenly felt dizzy and nauseated and he immediately lie on the floor. He was unable to get up for at least 15 minutes. He called for help from his wife. After 15 minutes patient felt completely normal and walked back to his house. A day ago patient also had transient weakness of the left upper extremity. Patient has been experiencing left-sided blindness off and on for last few weeks with headache. Patient had followed up with neurologist and was having outpatient workup. Patient also had occipital nerve block 3 days ago. Despite which patient still having headache. ESR and CRP levels are normal which were done as outpatient 3 days ago. In the ER on exam patient appears nonfocal is able to move all extremities 5 x 5. There is no facial asymmetry tongue is midline and no difficulty seeing or speaking or swallowing.  ED Course: MRI of the brain shows cerebellar and occipital stroke.  Review of Systems: As per HPI, rest all negative.   Past Medical History  Diagnosis Date  . Hearing loss   . 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 Cough     hx smoking  . Headache(784.0)     headaches r/t cervical issues  . Arthritis   . Chronic back pain     buldging disc  . Chronic neck pain     ruptured disc  . GERD (gastroesophageal reflux disease)     takes Prilosec daily  . H/O hiatal hernia   . History of colon polyps   . Diabetes mellitus     doesn't take anything;diet and exercise controlled  . Insomnia     takes Elavil nightly  .  Diverticulosis   . Esophageal stricture   . Hiatal hernia     Past Surgical History  Procedure Laterality Date  . Hand surgery Right 2005    right  . Lower back surgery  2009    had plates and screws inserted  . Neck surgery  2009    had insertion of  plates and screws  . Inner ear surgery Bilateral     bil;titanium in both ears  . Appendectomy      at age 39  . Colonoscopy    . Cardiac catheterization  06/03/04  . Anterior cervical decomp/discectomy fusion  04/12/2011    Procedure: ANTERIOR CERVICAL DECOMPRESSION/DISCECTOMY FUSION 2 LEVELS;  Surgeon: Peggyann Shoals, MD;  Location: [facility];  Service: Neurosurgery;  Laterality: N/A;  exploration of Cervical four - seven  fusion with redo Cervical six- seven, cervical three- four anterior cervical decompression with fusion interbody prothesis plating and bonegraft and C34 anterior cervical decompression with inte     reports that he quit smoking about 13 years ago. His smoking use included Cigarettes. He has a 41 pack-year smoking history. He has never used smokeless tobacco. He reports that he drinks alcohol. He reports that he does not use illicit drugs.  Allergies  Allergen Reactions  . Percocet [Oxycodone-Acetaminophen] Other (See Comments)    Hallucinations, inability to sleep.  Family History  Problem Relation Age of Onset  . Colon cancer Father   . Anesthesia problems Neg Hx   . Hypotension Neg Hx   . Malignant hyperthermia Neg Hx   . Pseudochol deficiency Neg Hx   . Colon cancer Maternal Grandmother   . Colon cancer Maternal Grandfather   . Heart disease Maternal Uncle   . Stroke Maternal Uncle   . Other Mother     Perforated bowel  . Cancer Brother   . Healthy Son   . Healthy Daughter     Prior to Admission medications   Medication Sig Start Date End Date Taking? Authorizing Provider  amitriptyline (ELAVIL) 50 MG tablet Take 50 mg by mouth at bedtime.    Yes Historical Provider, MD  Cyanocobalamin  (B-12) 1000 MCG/ML KIT Inject 1 mL as directed See admin instructions. Started 09/10/15, 1 injection daily for 7 days them reduce to 1 injection once weekly, then eventually to once monthly   Yes Historical Provider, MD  fluticasone (FLONASE) 50 MCG/ACT nasal spray Place 2 sprays into both nostrils daily. 03/27/13  Yes Vernie Shanks, MD  gabapentin (NEURONTIN) 300 MG capsule Take 300 mg by mouth Three times a day.  11/15/11  Yes Historical Provider, MD  HYDROcodone-acetaminophen (NORCO) 10-325 MG tablet Take 1 tablet by mouth 3 (three) times daily. 05/15/15  Yes Historical Provider, MD  lisinopril (PRINIVIL,ZESTRIL) 5 MG tablet Take 1 tablet (5 mg total) by mouth daily. 07/27/15  Yes Timmothy Euler, MD  Omega-3 Fatty Acids (FISH OIL) 1000 MG CAPS Take 2,000 mg by mouth 2 (two) times daily.   Yes Historical Provider, MD  omeprazole (PRILOSEC) 20 MG capsule Take 1 capsule (20 mg total) by mouth daily. 06/30/15  Yes Irene Shipper, MD  sildenafil (REVATIO) 20 MG tablet TAKE 2 TO 4 TABLETS AS NEEDED 03/26/15  Yes Timmothy Euler, MD    Physical Exam: Filed Vitals:   09/11/15 1839 09/11/15 1900 09/11/15 2112 09/11/15 2144  BP: 169/90 163/100 161/101 183/96  Pulse: 62 61 83 67  Temp: 98.7 F (37.1 C)     TempSrc: Oral     Resp: 18  12 14   Height:    5' 10"  (1.778 m)  Weight:    159 lb 4.8 oz (72.258 kg)  SpO2: 99% 97% 98% 100%      Constitutional: Not in distress. Filed Vitals:   09/11/15 1839 09/11/15 1900 09/11/15 2112 09/11/15 2144  BP: 169/90 163/100 161/101 183/96  Pulse: 62 61 83 67  Temp: 98.7 F (37.1 C)     TempSrc: Oral     Resp: 18  12 14   Height:    5' 10"  (1.778 m)  Weight:    159 lb 4.8 oz (72.258 kg)  SpO2: 99% 97% 98% 100%   Eyes: Anicteric no pallor. ENMT: No discharge from the ears eyes nose or mouth. Neck: No mass felt. No JVD appreciated. Respiratory: No rhonchi or crepitations. Cardiovascular: S1 and S2 heard. Abdomen: Soft nontender bowel sounds  present. Musculoskeletal: No edema. Skin: No rash. Neurologic: Alert awake oriented to time place and person. Moves all extremities 5 x 5. No facial asymmetry time is midline PERRLA positive. Psychiatric: Appears normal.   Labs on Admission: I have personally reviewed following labs and imaging studies  CBC:  Recent Labs Lab 09/11/15 1528  WBC 12.9*  NEUTROABS 6.1  HGB 14.3  HCT 43.3  MCV 98.6  PLT 662   Basic Metabolic Panel:  Recent Labs  Lab 09/11/15 1528  NA 137  K 4.6  CL 108  CO2 25  GLUCOSE 100*  BUN 14  CREATININE 0.82  CALCIUM 9.1   GFR: Estimated Creatinine Clearance: 94.3 mL/min (by C-G formula based on Cr of 0.82). Liver Function Tests:  Recent Labs Lab 09/11/15 1528  AST 28  ALT 23  ALKPHOS 95  BILITOT 0.3  PROT 6.8  ALBUMIN 3.3*   No results for input(s): LIPASE, AMYLASE in the last 168 hours. No results for input(s): AMMONIA in the last 168 hours. Coagulation Profile: No results for input(s): INR, PROTIME in the last 168 hours. Cardiac Enzymes: No results for input(s): CKTOTAL, CKMB, CKMBINDEX, TROPONINI in the last 168 hours. BNP (last 3 results) No results for input(s): PROBNP in the last 8760 hours. HbA1C: No results for input(s): HGBA1C in the last 72 hours. CBG: No results for input(s): GLUCAP in the last 168 hours. Lipid Profile: No results for input(s): CHOL, HDL, LDLCALC, TRIG, CHOLHDL, LDLDIRECT in the last 72 hours. Thyroid Function Tests: No results for input(s): TSH, T4TOTAL, FREET4, T3FREE, THYROIDAB in the last 72 hours. Anemia Panel: No results for input(s): VITAMINB12, FOLATE, FERRITIN, TIBC, IRON, RETICCTPCT in the last 72 hours. Urine analysis: No results found for: COLORURINE, APPEARANCEUR, LABSPEC, PHURINE, GLUCOSEU, HGBUR, BILIRUBINUR, KETONESUR, PROTEINUR, UROBILINOGEN, NITRITE, LEUKOCYTESUR Sepsis Labs: @LABRCNTIP (procalcitonin:4,lacticidven:4) )No results found for this or any previous visit (from the past  240 hour(s)).   Radiological Exams on Admission: Mr Herby Abraham Contrast  09/11/2015  CLINICAL DATA:  65 year old hypertensive diabetic male with lightheadedness yesterday with visual changes and dizziness. EXAM: MRI HEAD WITHOUT CONTRAST TECHNIQUE: Multiplanar, multiecho pulse sequences of the brain and surrounding structures were obtained without intravenous contrast. COMPARISON:  07/23/2007 brain MR. FINDINGS: Bilateral cerebellar and occipital lobe nonhemorrhagic infarcts. Major intracranial vascular structures are patent. Scattered punctate and patchy white matter changes most consistent with result of small vessel disease. Mild global atrophy without hydrocephalus. No acute orbital abnormality. Prior surgeries C3-4 level. Bulge C2-3 mild spinal stenosis. Cervical medullary junction unremarkable. Opacification right mastoid air cells without obstructing lesion of the eustachian tube noted. Suggestion of prior left mastoid surgery. IMPRESSION: Bilateral cerebellar and occipital lobe nonhemorrhagic infarcts. Mild chronic microvascular changes. Mild global atrophy. C2-3 bulge mild spinal stenosis. Opacification right mastoid air cells. Suggestion prior left mastoid surgery. These results were called by telephone at the time of interpretation on 09/11/2015 at 6:54 pm to Dr. Varney Biles , who verbally acknowledged these results. Electronically Signed   By: Genia Del M.D.   On: 09/11/2015 19:10    EKG: Independently reviewed. Normal sinus rhythm.  Assessment/Plan Principal Problem:   Acute ischemic stroke (HCC) Active Problems:   HTN (hypertension)   Stroke (cerebrum) ([place])    1. Acute ischemic stroke - appreciated neurology consult. Patient is not a candidate for TPA since symptoms have been there for more than 24 hours. Patient is on aspirin. Check lipid panel and hemoglobin A1c. Check MRA brain 2-D echo carotid Doppler and physical therapy consult. 2. Hypertension - continue lisinopril. Allow  for permissive hypertension. 3. Headache - will need further recommendations from neurologist. Patient has had recent occipital nerve block despite which patient is still having headache. Sedimentation rate and CRP levels were normal 3 days ago.   DVT prophylaxis: Lovenox. Code Status: Full code.  Family Communication: No family at the bedside.  Disposition Plan: Home.  Consults called: Neurology.  Admission status: Observation. Telemetry.    Rise Patience MD [facility] Pager [phone]-  [phone].  If 7PM-7AM, please contact night-coverage [email or web address] Password [email or web address]  09/11/2015, 10:07 PM

## 2015-09-11 NOTE — Consult Note (Signed)
Admission H&P    Chief Complaint: Syncopal episode.  HPI: Cameron Roman is an 65 y.o. male history diabetes mellitus and hypertension who is admitted with complaint of near syncopal episode on 09/10/2015. Patient felt lightheaded and dizzy as well as nauseated. He sat on the ground and was unable to get back to his feet initially. His wife described him as being very pale. There were no changes in speech. No facial droop was described. He had no extremity weakness at the time. He gives a history of transient left upper extremity weakness 2 days ago, for which he did not seek medical attention. 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 Patient has not been on antiplatelet therapy.  LSN: 09/10/2015 tPA Given: No: Beyond time window for treatment; no deficits mRankin:  Past Medical History  Diagnosis Date  . Hearing loss   . 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 Cough     hx smoking  . Headache(784.0)     headaches r/t cervical issues  . Arthritis   . Chronic back pain     buldging disc  . Chronic neck pain     ruptured disc  . GERD (gastroesophageal reflux disease)     takes Prilosec daily  . H/O hiatal hernia   . History of colon polyps   . Diabetes mellitus     doesn't take anything;diet and exercise controlled  . Insomnia     takes Elavil nightly  . Diverticulosis   . Esophageal stricture   . Hiatal hernia     Past Surgical History  Procedure Laterality Date  . Hand surgery Right 2005    right  . Lower back surgery  2009    had plates and screws inserted  . Neck surgery  2009    had insertion of  plates and screws  . Inner ear surgery Bilateral     bil;titanium in both ears  . Appendectomy      at age 73  . Colonoscopy    . Cardiac catheterization  06/03/04  . Anterior cervical decomp/discectomy fusion  04/12/2011    Procedure: ANTERIOR CERVICAL DECOMPRESSION/DISCECTOMY FUSION 2 LEVELS;  Surgeon: Peggyann Shoals, MD;  Location: [facility];  Service: Neurosurgery;  Laterality: N/A;  exploration of Cervical four - seven  fusion with redo Cervical six- seven, cervical three- four anterior cervical decompression with fusion interbody prothesis plating and bonegraft and C34 anterior cervical decompression with inte    Family History  Problem Relation Age of Onset  . Colon cancer Father   . Anesthesia problems Neg Hx   . Hypotension Neg Hx   . Malignant hyperthermia Neg Hx   . Pseudochol deficiency Neg Hx   . Colon cancer Maternal Grandmother   . Colon cancer Maternal Grandfather   . Heart disease Maternal Uncle   . Stroke Maternal Uncle   . Other Mother     Perforated bowel  . Cancer Brother   . Healthy Son   . Healthy Daughter    Social History:  reports that he quit smoking about 13 years ago. His smoking use included Cigarettes. He has a 41 pack-year smoking history. He has never used smokeless tobacco. He reports that he drinks alcohol. He reports that he does not use illicit drugs.  Allergies:  Allergies  Allergen Reactions  . Percocet [Oxycodone-Acetaminophen] Other (See Comments)    Hallucinations, inability to sleep.    Medications: Pre-existing medications were  reviewed by me.  ROS: History obtained from spouse and the patient  General ROS: negative for - chills, fatigue, fever, night sweats, weight gain or weight loss Psychological ROS: negative for - behavioral disorder, hallucinations, memory difficulties, mood swings or suicidal ideation Ophthalmic ROS: negative for - blurry vision, double vision, eye pain or loss of vision ENT ROS: negative for - epistaxis, nasal discharge, oral lesions, sore throat, tinnitus or vertigo Allergy and Immunology ROS: negative for - hives or itchy/watery eyes Hematological and Lymphatic ROS: negative for - bleeding problems, bruising or swollen lymph nodes Endocrine ROS: negative for - galactorrhea, hair pattern changes,  polydipsia/polyuria or temperature intolerance Respiratory ROS: negative for - cough, hemoptysis, shortness of breath or wheezing Cardiovascular ROS: negative for - chest pain, dyspnea on exertion, edema or irregular heartbeat Gastrointestinal ROS: negative for - abdominal pain, diarrhea, hematemesis, nausea/vomiting or stool incontinence Genito-Urinary ROS: negative for - dysuria, hematuria, incontinence or urinary frequency/urgency Musculoskeletal ROS: negative for - joint swelling or muscular weakness Neurological ROS: as noted in HPI Dermatological ROS: negative for rash and skin lesion changes  Physical Examination: Blood pressure 163/100, pulse 61, temperature 98.7 F (37.1 C), temperature source Oral, resp. rate 18, height 5' 10"  (1.778 m), weight 73.483 kg (162 lb), SpO2 97 %.  HEENT-  Normocephalic, no lesions, without obvious abnormality.  Normal external eye and conjunctiva.  Normal TM's bilaterally.  Normal auditory canals and external ears. Normal external nose, mucus membranes and septum.  Normal pharynx. Neck supple with no masses, nodes, nodules or enlargement. Cardiovascular - regular rate and rhythm, S1, S2 normal, no murmur, click, rub or gallop Lungs - chest clear, no wheezing, rales, normal symmetric air entry Abdomen - soft, non-tender; bowel sounds normal; no masses,  no organomegaly Extremities - no joint deformities, effusion, or inflammation  Neurologic Examination: Mental Status: Alert, oriented, no acute distress.  Speech fluent without evidence of aphasia. Able to follow commands without difficulty. Cranial Nerves: II-Visual fields were normal. III/IV/VI-Pupils were equal and reacted normally to light. Extraocular movements were full and conjugate.    V/VII-no facial numbness and no facial weakness. VIII-normal. X-normal speech and symmetrical palatal movement. XI: trapezius strength/neck flexion strength normal bilaterally XII-midline tongue extension with  normal strength. Motor: 5/5 bilaterally with normal tone and bulk Sensory: Normal throughout. Deep Tendon Reflexes: 1+ and symmetric. Plantars: Flexor bilaterally Cerebellar: Normal coordination of upper extremities. Carotid auscultation: Normal  Results for orders placed or performed during the hospital encounter of 09/11/15 (from the past 48 hour(s))  CBC with Differential     Status: Abnormal   Collection Time: 09/11/15  3:28 PM  Result Value Ref Range   WBC 12.9 (H) 4.0 - 10.5 K/uL   RBC 4.39 4.22 - 5.81 MIL/uL   Hemoglobin 14.3 13.0 - 17.0 g/dL   HCT 43.3 39.0 - 52.0 %   MCV 98.6 78.0 - 100.0 fL   MCH 32.6 26.0 - 34.0 pg   MCHC 33.0 30.0 - 36.0 g/dL   RDW 14.7 11.5 - 15.5 %   Platelets 360 150 - 400 K/uL   Neutrophils Relative % 48 %   Neutro Abs 6.1 1.7 - 7.7 K/uL   Lymphocytes Relative 40 %   Lymphs Abs 5.2 (H) 0.7 - 4.0 K/uL   Monocytes Relative 10 %   Monocytes Absolute 1.3 (H) 0.1 - 1.0 K/uL   Eosinophils Relative 2 %   Eosinophils Absolute 0.3 0.0 - 0.7 K/uL   Basophils Relative 0 %   Basophils Absolute  0.0 0.0 - 0.1 K/uL  Comprehensive metabolic panel     Status: Abnormal   Collection Time: 09/11/15  3:28 PM  Result Value Ref Range   Sodium 137 135 - 145 mmol/L   Potassium 4.6 3.5 - 5.1 mmol/L   Chloride 108 101 - 111 mmol/L   CO2 25 22 - 32 mmol/L   Glucose, Bld 100 (H) 65 - 99 mg/dL   BUN 14 6 - 20 mg/dL   Creatinine, Ser 0.82 0.61 - 1.24 mg/dL   Calcium 9.1 8.9 - 10.3 mg/dL   Total Protein 6.8 6.5 - 8.1 g/dL   Albumin 3.3 (L) 3.5 - 5.0 g/dL   AST 28 15 - 41 U/L   ALT 23 17 - 63 U/L   Alkaline Phosphatase 95 38 - 126 U/L   Total Bilirubin 0.3 0.3 - 1.2 mg/dL   GFR calc non Af Amer >60 >60 mL/min   GFR calc Af Amer >60 >60 mL/min    Comment: (NOTE) The eGFR has been calculated using the CKD EPI equation. This calculation has not been validated in all clinical situations. eGFR's persistently <60 mL/min signify possible Chronic Kidney Disease.     Anion gap 4 (L) 5 - 15   Mr Brain Wo Contrast  09/11/2015  CLINICAL DATA:  65 year old hypertensive diabetic male with lightheadedness yesterday with visual changes and dizziness. EXAM: MRI HEAD WITHOUT CONTRAST TECHNIQUE: Multiplanar, multiecho pulse sequences of the brain and surrounding structures were obtained without intravenous contrast. COMPARISON:  07/23/2007 brain MR. FINDINGS: Bilateral cerebellar and occipital lobe nonhemorrhagic infarcts. Major intracranial vascular structures are patent. Scattered punctate and patchy white matter changes most consistent with result of small vessel disease. Mild global atrophy without hydrocephalus. No acute orbital abnormality. Prior surgeries C3-4 level. Bulge C2-3 mild spinal stenosis. Cervical medullary junction unremarkable. Opacification right mastoid air cells without obstructing lesion of the eustachian tube noted. Suggestion of prior left mastoid surgery. IMPRESSION: Bilateral cerebellar and occipital lobe nonhemorrhagic infarcts. Mild chronic microvascular changes. Mild global atrophy. C2-3 bulge mild spinal stenosis. Opacification right mastoid air cells. Suggestion prior left mastoid surgery. These results were called by telephone at the time of interpretation on 09/11/2015 at 6:54 pm to Dr. Varney Biles , who verbally acknowledged these results. Electronically Signed   By: Genia Del M.D.   On: 09/11/2015 19:10    Assessment: 65 y.o. male with multiple risk factors for stroke presenting with multiple acute posterior circulation infarctions involving cerebellum and occipital lobes, as well as possible TIA 2 days ago with transient left upper extremity weakness.  Stroke Risk Factors - diabetes mellitus, family history and hypertension  Plan: 1. HgbA1c, fasting lipid panel 2. MRA  of the brain without contrast 3. PT consult, OT consult, Speech consult 4. Echocardiogram 5. Carotid dopplers 6. Prophylactic therapy-Antiplatelet med: Aspirin   7. Risk factor modification 8. Telemetry monitoring  C.R. Nicole Kindred, MD [facility] [phone]  09/11/2015, 7:59 PM

## 2015-09-11 NOTE — Telephone Encounter (Signed)
Patient said that he was picking blackberries yesterday and felt like he was about to pass out.  His peripheral vision went out in his left eye and still has not come back totally.  He said that he was not overheated and had eaten.  Should I have him contact PCP?

## 2015-09-11 NOTE — ED Notes (Signed)
Pt reports near syncopal episode yesterday while picking blackberries. States he became dizzy and had to sit down on ground. Reports issues with blurred vision for several months. No neurological deficits noted upon arrival to ED.

## 2015-09-11 NOTE — Telephone Encounter (Signed)
Please have him go directly to the closest ER for evaluation - he needs STAT CT head and CTA head and neck.

## 2015-09-12 ENCOUNTER — Encounter (HOSPITAL_COMMUNITY): Payer: Medicare Other

## 2015-09-12 ENCOUNTER — Encounter (HOSPITAL_COMMUNITY): Payer: Self-pay | Admitting: General Practice

## 2015-09-12 DIAGNOSIS — G8324 Monoplegia of upper limb affecting left nondominant side: Secondary | ICD-10-CM | POA: Diagnosis present

## 2015-09-12 DIAGNOSIS — I6789 Other cerebrovascular disease: Secondary | ICD-10-CM | POA: Diagnosis not present

## 2015-09-12 DIAGNOSIS — I639 Cerebral infarction, unspecified: Secondary | ICD-10-CM | POA: Diagnosis not present

## 2015-09-12 DIAGNOSIS — Z886 Allergy status to analgesic agent status: Secondary | ICD-10-CM | POA: Diagnosis not present

## 2015-09-12 DIAGNOSIS — Z7982 Long term (current) use of aspirin: Secondary | ICD-10-CM | POA: Diagnosis not present

## 2015-09-12 DIAGNOSIS — I6340 Cerebral infarction due to embolism of unspecified cerebral artery: Secondary | ICD-10-CM | POA: Diagnosis present

## 2015-09-12 DIAGNOSIS — H5442 Blindness, left eye, normal vision right eye: Secondary | ICD-10-CM | POA: Diagnosis present

## 2015-09-12 DIAGNOSIS — D72829 Elevated white blood cell count, unspecified: Secondary | ICD-10-CM | POA: Diagnosis present

## 2015-09-12 DIAGNOSIS — M542 Cervicalgia: Secondary | ICD-10-CM | POA: Diagnosis present

## 2015-09-12 DIAGNOSIS — G4700 Insomnia, unspecified: Secondary | ICD-10-CM | POA: Diagnosis present

## 2015-09-12 DIAGNOSIS — G8929 Other chronic pain: Secondary | ICD-10-CM | POA: Diagnosis present

## 2015-09-12 DIAGNOSIS — K219 Gastro-esophageal reflux disease without esophagitis: Secondary | ICD-10-CM | POA: Diagnosis present

## 2015-09-12 DIAGNOSIS — H9190 Unspecified hearing loss, unspecified ear: Secondary | ICD-10-CM | POA: Diagnosis present

## 2015-09-12 DIAGNOSIS — I10 Essential (primary) hypertension: Secondary | ICD-10-CM | POA: Diagnosis not present

## 2015-09-12 DIAGNOSIS — E785 Hyperlipidemia, unspecified: Secondary | ICD-10-CM | POA: Diagnosis present

## 2015-09-12 DIAGNOSIS — E119 Type 2 diabetes mellitus without complications: Secondary | ICD-10-CM | POA: Diagnosis present

## 2015-09-12 DIAGNOSIS — M549 Dorsalgia, unspecified: Secondary | ICD-10-CM | POA: Diagnosis present

## 2015-09-12 DIAGNOSIS — M1990 Unspecified osteoarthritis, unspecified site: Secondary | ICD-10-CM | POA: Diagnosis present

## 2015-09-12 DIAGNOSIS — Z86010 Personal history of colonic polyps: Secondary | ICD-10-CM | POA: Diagnosis not present

## 2015-09-12 DIAGNOSIS — Z885 Allergy status to narcotic agent status: Secondary | ICD-10-CM | POA: Diagnosis not present

## 2015-09-12 DIAGNOSIS — Z87891 Personal history of nicotine dependence: Secondary | ICD-10-CM | POA: Diagnosis not present

## 2015-09-12 DIAGNOSIS — Z79899 Other long term (current) drug therapy: Secondary | ICD-10-CM | POA: Diagnosis not present

## 2015-09-12 DIAGNOSIS — I739 Peripheral vascular disease, unspecified: Secondary | ICD-10-CM | POA: Diagnosis present

## 2015-09-12 LAB — COMPREHENSIVE METABOLIC PANEL
ALK PHOS: 78 U/L (ref 38–126)
ALT: 20 U/L (ref 17–63)
ANION GAP: 6 (ref 5–15)
AST: 25 U/L (ref 15–41)
Albumin: 2.9 g/dL — ABNORMAL LOW (ref 3.5–5.0)
BUN: 12 mg/dL (ref 6–20)
CALCIUM: 9 mg/dL (ref 8.9–10.3)
CO2: 26 mmol/L (ref 22–32)
CREATININE: 0.8 mg/dL (ref 0.61–1.24)
Chloride: 107 mmol/L (ref 101–111)
Glucose, Bld: 92 mg/dL (ref 65–99)
Potassium: 4 mmol/L (ref 3.5–5.1)
SODIUM: 139 mmol/L (ref 135–145)
TOTAL PROTEIN: 6.4 g/dL — AB (ref 6.5–8.1)
Total Bilirubin: 0.9 mg/dL (ref 0.3–1.2)

## 2015-09-12 LAB — CBC
HCT: 42.4 % (ref 39.0–52.0)
Hemoglobin: 14.1 g/dL (ref 13.0–17.0)
MCH: 33.3 pg (ref 26.0–34.0)
MCHC: 33.3 g/dL (ref 30.0–36.0)
MCV: 100.2 fL — ABNORMAL HIGH (ref 78.0–100.0)
PLATELETS: 328 10*3/uL (ref 150–400)
RBC: 4.23 MIL/uL (ref 4.22–5.81)
RDW: 14.6 % (ref 11.5–15.5)
WBC: 12.3 10*3/uL — ABNORMAL HIGH (ref 4.0–10.5)

## 2015-09-12 LAB — GLUCOSE, CAPILLARY
Glucose-Capillary: 92 mg/dL (ref 65–99)
Glucose-Capillary: 111 mg/dL — ABNORMAL HIGH (ref 65–99)
Glucose-Capillary: 119 mg/dL — ABNORMAL HIGH (ref 65–99)
Glucose-Capillary: 105 mg/dL — ABNORMAL HIGH (ref 65–99)

## 2015-09-12 LAB — LIPID PANEL
CHOL/HDL RATIO: 4.1 ratio
Cholesterol: 140 mg/dL (ref 0–200)
HDL: 34 mg/dL — AB (ref >40)
LDL Cholesterol: 72 mg/dL (ref 0–99)
TRIGLYCERIDES: 168 mg/dL — AB (ref <150)
VLDL: 34 mg/dL (ref 0–40)

## 2015-09-12 NOTE — Progress Notes (Signed)
STROKE TEAM PROGRESS NOTE   HISTORY OF PRESENT ILLNESS (per record) Cameron Roman is an 65 y.o. male history diabetes mellitus and hypertension who is admitted with complaint of near syncopal episode on 09/10/2015. Patient felt lightheaded and dizzy as well as nauseated. He sat on the ground and was unable to get back to his feet initially. His wife described him as being very pale. There were no changes in speech. No facial droop was described. He had no extremity weakness at the time. He gives a history of transient left upper extremity weakness 2 days ago, for which he did not seek medical attention. 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 Patient has not been on antiplatelet therapy.  LSN: 09/10/2015 tPA Given: No: Beyond time window for treatment; no deficits mRankin:   SUBJECTIVE (INTERVAL HISTORY) Patient's wife and daughter at the bedside. Dr. Leonie Man discussed stroke risk factors and explained the need for a TEE and possible loop recorder.    OBJECTIVE Temp:  [97.7 F (36.5 C)-98.7 F (37.1 C)] 97.8 F (36.6 C) (07/01 0630) Pulse Rate:  [61-83] 62 (07/01 0630) Cardiac Rhythm:  [-] Normal sinus rhythm (06/30 2118) Resp:  [10-20] 16 (07/01 0630) BP: (122-183)/(81-106) 161/81 mmHg (07/01 0630) SpO2:  [94 %-100 %] 100 % (07/01 0630) Weight:  [72.258 kg (159 lb 4.8 oz)-73.483 kg (162 lb)] 72.258 kg (159 lb 4.8 oz) (06/30 2144)  CBC:  Recent Labs Lab 09/11/15 1528 09/12/15 0440  WBC 12.9* 12.3*  NEUTROABS 6.1  --   HGB 14.3 14.1  HCT 43.3 42.4  MCV 98.6 100.2*  PLT 360 297    Basic Metabolic Panel:  Recent Labs Lab 09/11/15 1528 09/12/15 0440  NA 137 139  K 4.6 4.0  CL 108 107  CO2 25 26  GLUCOSE 100* 92  BUN 14 12  CREATININE 0.82 0.80  CALCIUM 9.1 9.0    Lipid Panel:    Component Value Date/Time   CHOL 140 09/12/2015 0440   CHOL 156 04/03/2015 0942   CHOL 127 08/30/2012 0955   TRIG 168*  09/12/2015 0440   TRIG 301* 04/16/2013 0917   TRIG 166* 08/30/2012 0955   HDL 34* 09/12/2015 0440   HDL 39* 04/03/2015 0942   HDL 38* 04/16/2013 0917   HDL 39* 08/30/2012 0955   CHOLHDL 4.1 09/12/2015 0440   CHOLHDL 4.0 04/03/2015 0942   VLDL 34 09/12/2015 0440   LDLCALC 72 09/12/2015 0440   LDLCALC 81 04/03/2015 0942   LDLCALC 58 04/16/2013 0917   LDLCALC 55 08/30/2012 0955   HgbA1c:  Lab Results  Component Value Date   HGBA1C 5.6 03/27/2015   Urine Drug Screen: No results found for: LABOPIA, COCAINSCRNUR, LABBENZ, AMPHETMU, THCU, LABBARB    IMAGING  Dg Chest 2 View 09/12/2015   No active cardiopulmonary disease.   Mr Brain Wo Contrast 09/11/2015   Bilateral cerebellar and occipital lobe nonhemorrhagic infarcts. Mild chronic microvascular changes. Mild global atrophy. C2-3 bulge mild spinal stenosis. Opacification right mastoid air cells. Suggestion prior left mastoid surgery.     Mr Jodene Nam Head/brain Wo Cm 09/11/2015   Normal intracranial MRA.    PHYSICAL EXAM Pleasant middle aged 39 male not in distress. . Afebrile. Head is nontraumatic. Neck is supple without bruit.    Cardiac exam no murmur or gallop. Lungs are clear to auscultation. Distal pulses are well felt. Neurological Exam ;  Awake  Alert oriented x 3. Normal speech and language.eye movements  full without nystagmus.fundi were not visualized. Vision acuity and fields appear normal. Hearing is normal. Palatal movements are normal. Face symmetric. Tongue midline. Normal strength, tone, reflexes and coordination. Normal sensation. Gait deferred.      ASSESSMENT/PLAN Cameron Roman is a 65 y.o. male with history of hearing loss, diabetes mellitus, and chronic back pain presenting with near syncope and transient left upper extremity weakness. He did not receive IV t-PA due to the presentation.  Strokes:  Bilateral infarcts felt to be embolic from an unknown source.  Resultant - resolution of  deficits  MRI - Bilateral cerebellar and occipital lobe nonhemorrhagic infarcts.  MRA - normal  Carotid Doppler - pending  2D Echo - pending  LDL - 72  HgbA1c pending  VTE prophylaxis - Lovenox  Diet heart healthy/carb modified Room service appropriate?: Yes; Fluid consistency:: Thin  No antithrombotic prior to admission, now on aspirin 325 mg daily  Patient counseled to be compliant with his antithrombotic medications  Ongoing aggressive stroke risk factor management  Therapy recommendations: No follow-up PT or OT recommended.  Disposition: Pending  Hypertension  Stable  Permissive hypertension (OK if < 220/120) but gradually normalize in 5-7 days  Long-term BP goal normotensive   Hyperlipidemia  Home meds:  Omega-3 fatty acids resumed in hospital  LDL 72, goal < 70   Diabetes  HgbA1c pending, goal < 7.0  Controlled    Other Stroke Risk Factors  Advanced age  Cigarette smoker - quit smoking 13 years ago  ETOH use, advised to drink no more than 1 to 2 drink(s) a day  Family hx stroke (maternal uncle)   Other Active Problems  Mild leukocytosis - afebrile   PLAN  TEE and possible loop Monday  Hospital day #    Cameron Bussing PA-C [facility] Pager [phone] 09/12/2015, 2:57 PM  I have personally examined this patient, reviewed notes, independently viewed imaging studies, participated in medical decision making and plan of care. I have made any additions or clarifications directly to the above note. Agree with note above. He  presented with dizziness, nausea and weakness    due to  embolic occipital and cerebellar infarcts   and remains at risk for neurological worsening, recurrent stroke/TIA and needs ongoing stroke evaluation and aggressive risk factor modification.Recommend TEE and loop recorder on Monday if source of embolism not found on other tests first.Start aspirin for stroke prevention.Greater than  50% time during  this 35 minite visit was spent on counselling and coordination of care about stroke risk, prevention and treatment.    Antony Contras, MD Medical Director [facility] Pager: [phone] 09/12/2015 6:54 PM    To contact Stroke Continuity provider, please refer to [email or web address] After hours, contact General Neurology

## 2015-09-12 NOTE — Evaluation (Signed)
Occupational Therapy Evaluation Patient Details Name: Cameron Roman MRN: [id number] DOB: Jul 31, 1950 Today's Date: 09/12/2015    History of Present Illness 65 y.o. male with PMHx of DM, HTN, diverticulosis and cervical fusion who presents to the Emergency Department complaining of near syncopal episode yesterday while standing and picking blackberries MRI reveals acute infarcts in cerebellar and occipital regions.   Clinical Impression   Pt reports he was independent with ADLs PTA. Currently pt has returned to baseline and is independent with ADLs and functional mobility. Pt presenting without UE weakness, sensation deficits, or visual changes. Pt planning to d/c home with 24/7 supervision from family. No further acute OT needs identified; signing off at this time. Please re-consult if needs change. Thank you for this referral.    Follow Up Recommendations  No OT follow up;Supervision - Intermittent    Equipment Recommendations  None recommended by OT    Recommendations for Other Services       Precautions / Restrictions Precautions Precautions: Fall Restrictions Weight Bearing Restrictions: No      Mobility Bed Mobility Overal bed mobility: Independent                Transfers Overall transfer level: Independent Equipment used: None                  Balance Overall balance assessment: Independent                               Standardized Balance Assessment Standardized Balance Assessment : Dynamic Gait Index   Dynamic Gait Index Level Surface: Normal Change in Gait Speed: Normal Gait with Horizontal Head Turns: Normal Gait with Vertical Head Turns: Normal Gait and Pivot Turn: Normal Step Over Obstacle: Normal Step Around Obstacles: Normal Steps: Normal Total Score: 24      ADL Overall ADL's : Independent                                             Vision Vision Assessment?: No apparent visual deficits    Perception     Praxis      Pertinent Vitals/Pain Pain Assessment: No/denies pain     Hand Dominance Right   Extremity/Trunk Assessment Upper Extremity Assessment Upper Extremity Assessment: Overall WFL for tasks assessed   Lower Extremity Assessment Lower Extremity Assessment: Overall WFL for tasks assessed   Cervical / Trunk Assessment Cervical / Trunk Assessment: Normal   Communication Communication Communication: No difficulties   Cognition Arousal/Alertness: Awake/alert Behavior During Therapy: WFL for tasks assessed/performed Overall Cognitive Status: Within Functional Limits for tasks assessed                     General Comments       Exercises       Shoulder Instructions      Home Living Family/patient expects to be discharged to:: Private residence Living Arrangements: Spouse/significant other Available Help at Discharge: Family Type of Home: House Home Access: Level entry     Home Layout: One level     Bathroom Shower/Tub: Occupational psychologist: Handicapped height     Home Equipment: None          Prior Functioning/Environment Level of Independence: Independent        Comments: was picking berries prior to event  OT Diagnosis: Disturbance of vision   OT Problem List:     OT Treatment/Interventions:      OT Goals(Current goals can be found in the care plan section) Acute Rehab OT Goals Patient Stated Goal: home OT Goal Formulation: All assessment and education complete, DC therapy  OT Frequency:     Barriers to D/C:            Co-evaluation              End of Session Equipment Utilized During Treatment: Gait belt Nurse Communication: Mobility status  Activity Tolerance: Patient tolerated treatment well Patient left: in chair;with call bell/phone within reach;with chair alarm set;with family/visitor present   Time: 9753-0051 OT Time Calculation (min): 26 min ([place] with PT) Charges:  OT  General Charges $OT Visit: 1 Procedure OT Evaluation $OT Eval Low Complexity: 1 Procedure G-Codes: OT G-codes **NOT FOR INPATIENT CLASS** Functional Assessment Tool Used: Clinical judgement Functional Limitation: Self care Self Care Current Status (T0211): 0 percent impaired, limited or restricted Self Care Goal Status (Z7356): 0 percent impaired, limited or restricted Self Care Discharge Status (P0141): 0 percent impaired, limited or restricted   Binnie Kand M.S., OTR/L Pager: [phone]  09/12/2015, 11:39 AM

## 2015-09-12 NOTE — Progress Notes (Signed)
PROGRESS NOTE  Cameron Roman  VQX:[id number] DOB: 05-03-1950  DOA: 09/11/2015 PCP: Kenn File, MD   Brief Narrative:  65 year old male with PMH of diet-controlled DM 2, HTN, HLD, GERD, presented to ED with complaints of dizziness and nausea 24 hours PTA. He was picking berries on 09/10/15 morning when he suddenly felt dizzy, nauseous and had to lie down on the floor and was unable to get up for at least 15 minutes. After 15 minutes his symptoms resolved and he walked back to his house. He also had transient weakness of his left upper extremity. History of on and off left-sided blindness for few weeks with occipital headache. He had seen his outpatient neurologist and was undergoing workup. Admitted with acute ischemic stroke. Neurology consulting.   Assessment & Plan:   Principal Problem:   Acute ischemic stroke ([place]) Active Problems:   HTN (hypertension)   Stroke (cerebrum) ([facility])   Acute stroke - Transient ataxia, LUE weakness and occipital headache-resolved. - Etiology: Embolic stroke  - MRI brain 09/11/15: Bilateral cerebellar and occipital lobe nonhemorrhagic infarcts. - MRA brain 09/11/15: Normal intracranial MRA. - 2-D echo: Pending - Neurology has requested CTA neck - LDL: 72 - A1c: Pending - Discussed with stroke M.D.: Requests TEE and loop recorder. [facility] cardiology consulted for same. - Not on antithrombotic prior to admission. Now on aspirin 325 MG daily for secondary stroke prophylaxis. - PT and OT do not see any needs.  Essential hypertension - Continue lisinopril. Allow for permissive hypertension.  Headache - May be related to stroke. Seems to have resolved. Recently had occipital nerve block. ESR and CRP normal 3 days PTA.   Hyperlipidemia - LDL 72, goal <70. Consider statins. Continue omega-3  Diet-controlled DM 2  - Follow A1c. Continue SSI.   GERD -PPI    DVT prophylaxis: Lovenox Code Status: Full Family Communication: Discussed with spouse and  daughter at bedside on 7/1. Updated care and answered questions.  Disposition Plan: DC home after stroke workup completed and medically stable.    Consultants:   Neurology.   Procedures:   None   Antimicrobials:   None    Subjective: Denies complaints. No further strokelike symptoms. Denies dizziness, lightheadedness or visual symptoms.   Objective:  Filed Vitals:   09/12/15 0630 09/12/15 0800 09/12/15 0942 09/12/15 1330  BP: 161/81 161/88 138/83 143/95  Pulse: 62 72 76 75  Temp: 97.8 F (36.6 C) 98.4 F (36.9 C) 98.6 F (37 C) 98.1 F (36.7 C)  TempSrc: Oral Oral Oral Oral  Resp: 16 16 16 18   Height:      Weight:      SpO2: 100% 100% 97% 98%   No intake or output data in the 24 hours ending 09/12/15 1345 Filed Weights   09/11/15 1333 09/11/15 2144  Weight: 73.483 kg (162 lb) 72.258 kg (159 lb 4.8 oz)    Examination:  General exam: Pleasant middle-aged male sitting up comfortably in chair. Family at bedside.  Respiratory system: Clear to auscultation. Respiratory effort normal. Cardiovascular system: S1 & S2 heard, RRR. No JVD, murmurs, rubs, gallops or clicks. No pedal edema. Telemetry: Sinus rhythm.  Gastrointestinal system: Abdomen is nondistended, soft and nontender. No organomegaly or masses felt. Normal bowel sounds heard. Central nervous system: Alert and oriented. No focal neurological deficits. Extremities: Symmetric 5 x 5 power. Skin: No rashes, lesions or ulcers Psychiatry: Judgement and insight appear normal. Mood & affect appropriate.     Data Reviewed: I have personally reviewed following labs and  imaging studies  CBC:  Recent Labs Lab 09/11/15 1528 09/12/15 0440  WBC 12.9* 12.3*  NEUTROABS 6.1  --   HGB 14.3 14.1  HCT 43.3 42.4  MCV 98.6 100.2*  PLT 360 160   Basic Metabolic Panel:  Recent Labs Lab 09/11/15 1528 09/12/15 0440  NA 137 139  K 4.6 4.0  CL 108 107  CO2 25 26  GLUCOSE 100* 92  BUN 14 12  CREATININE 0.82 0.80   CALCIUM 9.1 9.0   GFR: Estimated Creatinine Clearance: 96.7 mL/min (by C-G formula based on Cr of 0.8). Liver Function Tests:  Recent Labs Lab 09/11/15 1528 09/12/15 0440  AST 28 25  ALT 23 20  ALKPHOS 95 78  BILITOT 0.3 0.9  PROT 6.8 6.4*  ALBUMIN 3.3* 2.9*   No results for input(s): LIPASE, AMYLASE in the last 168 hours. No results for input(s): AMMONIA in the last 168 hours. Coagulation Profile: No results for input(s): INR, PROTIME in the last 168 hours. Cardiac Enzymes: No results for input(s): CKTOTAL, CKMB, CKMBINDEX, TROPONINI in the last 168 hours. BNP (last 3 results) No results for input(s): PROBNP in the last 8760 hours. HbA1C: No results for input(s): HGBA1C in the last 72 hours. CBG:  Recent Labs Lab 09/11/15 2215 09/12/15 0647 09/12/15 1154  GLUCAP 117* 92 111*   Lipid Profile:  Recent Labs  09/12/15 0440  CHOL 140  HDL 34*  LDLCALC 72  TRIG 168*  CHOLHDL 4.1   Thyroid Function Tests: No results for input(s): TSH, T4TOTAL, FREET4, T3FREE, THYROIDAB in the last 72 hours. Anemia Panel: No results for input(s): VITAMINB12, FOLATE, FERRITIN, TIBC, IRON, RETICCTPCT in the last 72 hours.  Sepsis Labs: No results for input(s): PROCALCITON, LATICACIDVEN in the last 168 hours.  No results found for this or any previous visit (from the past 240 hour(s)).       Radiology Studies: Dg Chest 2 View  09/12/2015  CLINICAL DATA:  65 year old male with stroke. EXAM: CHEST  2 VIEW COMPARISON:  Chest radiograph dated 12/11/2012 FINDINGS: Two views of the chest demonstrate emphysematous changes of the lungs. There is no focal consolidation, pleural effusion, or pneumothorax. The cardiac silhouette is within normal limits. No acute osseous pathology. IMPRESSION: No active cardiopulmonary disease. Electronically Signed   By: Anner Crete M.D.   On: 09/12/2015 01:51   Mr Brain Wo Contrast  09/11/2015  CLINICAL DATA:  65 year old hypertensive diabetic male  with lightheadedness yesterday with visual changes and dizziness. EXAM: MRI HEAD WITHOUT CONTRAST TECHNIQUE: Multiplanar, multiecho pulse sequences of the brain and surrounding structures were obtained without intravenous contrast. COMPARISON:  07/23/2007 brain MR. FINDINGS: Bilateral cerebellar and occipital lobe nonhemorrhagic infarcts. Major intracranial vascular structures are patent. Scattered punctate and patchy white matter changes most consistent with result of small vessel disease. Mild global atrophy without hydrocephalus. No acute orbital abnormality. Prior surgeries C3-4 level. Bulge C2-3 mild spinal stenosis. Cervical medullary junction unremarkable. Opacification right mastoid air cells without obstructing lesion of the eustachian tube noted. Suggestion of prior left mastoid surgery. IMPRESSION: Bilateral cerebellar and occipital lobe nonhemorrhagic infarcts. Mild chronic microvascular changes. Mild global atrophy. C2-3 bulge mild spinal stenosis. Opacification right mastoid air cells. Suggestion prior left mastoid surgery. These results were called by telephone at the time of interpretation on 09/11/2015 at 6:54 pm to Dr. Varney Biles , who verbally acknowledged these results. Electronically Signed   By: Genia Del M.D.   On: 09/11/2015 19:10   Mr Jodene Nam Head/brain Wo Cm  09/11/2015  CLINICAL DATA:  Initial evaluation for acute stroke. EXAM: MRA HEAD WITHOUT CONTRAST TECHNIQUE: Angiographic images of the Circle of Willis were obtained using MRA technique without intravenous contrast. COMPARISON:  Prior MRI from earlier the same day. FINDINGS: ANTERIOR CIRCULATION: Distal cervical segments of the internal carotid arteries are patent with antegrade flow. Distal right ICA mildly tortuous. Petrous, cavernous, and supraclinoid segments patent without flow-limiting stenosis. A1 segments, anterior communicating artery, and anterior cerebral arteries well opacified to their distal aspects. M1 segments  patent without stenosis or occlusion. MCA bifurcations normal. Distal MCA branches well opacified and symmetric. Significant atheromatous disease appreciated within the anterior circulation. POSTERIOR CIRCULATION: Vertebral arteries widely patent to the vertebrobasilar junction. Vertebral arteries appear to be codominant. Posterior inferior cerebral arteries not visualized on this exam. Vertebrobasilar junction normal. Basilar artery widely patent without basilar tip stenosis or aneurysm. Superior cerebral arteries patent bilaterally. Both of the posterior cerebral arteries arise knee basilar artery and are well opacified to their distal aspects. No aneurysm or vascular malformation. IMPRESSION: Normal intracranial MRA. Electronically Signed   By: Jeannine Boga M.D.   On: 09/11/2015 23:32        Scheduled Meds: .  stroke: mapping our early stages of recovery book   Does not apply Once  . amitriptyline  50 mg Oral QHS  . aspirin  300 mg Rectal Daily   Or  . aspirin  325 mg Oral Daily  . cyanocobalamin  1,000 mcg Intramuscular Daily  . enoxaparin (LOVENOX) injection  40 mg Subcutaneous QHS  . fluticasone  2 spray Each Nare Daily  . gabapentin  300 mg Oral TID  . HYDROcodone-acetaminophen  1 tablet Oral TID  . insulin aspart  0-9 Units Subcutaneous TID WC  . lisinopril  5 mg Oral Daily  . omega-3 acid ethyl esters  2 g Oral BID  . pantoprazole  40 mg Oral Daily   Continuous Infusions: . sodium chloride 50 mL/hr at 09/12/15 0021        Time spent: 40 minutes.    [facility], MD [facility] Pager [phone]  If 7PM-7AM, please contact night-coverage [email or web address] Password [email or web address] 09/12/2015, 1:45 PM

## 2015-09-12 NOTE — Evaluation (Signed)
Physical Therapy Evaluation Patient Details Name: Cameron Roman MRN: [id number] DOB: 08/26/1950 Today's Date: 09/12/2015   History of Present Illness  65 y.o. male with PMHx of DM, HTN, diverticulosis and cervical fusion who presents to the Emergency Department complaining of near syncopal episode yesterday while standing and picking blackberries MRI reveals acute infarcts in cerebellar and occipital regions.  Clinical Impression  Patient seen for mobility evaluation and education s/p CVA. Patient mobilizing well. Educated on BE FAST signs of stroke. Patient and spouse receptive. No further acute PT needs. Will sign off.    Follow Up Recommendations No PT follow up    Equipment Recommendations  None recommended by PT    Recommendations for Other Services       Precautions / Restrictions Precautions Precautions: Fall      Mobility  Bed Mobility Overal bed mobility: Independent                Transfers Overall transfer level: Independent Equipment used: None                Ambulation/Gait Ambulation/Gait assistance: Independent Ambulation Distance (Feet): 410 Feet Assistive device: None Gait Pattern/deviations: WFL(Within Functional Limits)        Stairs            Wheelchair Mobility    Modified Rankin (Stroke Patients Only) Modified Rankin (Stroke Patients Only) Pre-Morbid Rankin Score: No symptoms Modified Rankin: Moderate disability     Balance Overall balance assessment: Independent                               Standardized Balance Assessment Standardized Balance Assessment : Dynamic Gait Index   Dynamic Gait Index Level Surface: Normal Change in Gait Speed: Normal Gait with Horizontal Head Turns: Normal Gait with Vertical Head Turns: Normal Gait and Pivot Turn: Normal Step Over Obstacle: Normal Step Around Obstacles: Normal Steps: Normal Total Score: 24       Pertinent Vitals/Pain Pain Assessment: No/denies  pain    Home Living Family/patient expects to be discharged to:: Private residence Living Arrangements: Spouse/significant other Available Help at Discharge: Family Type of Home: House Home Access: Level entry     Home Layout: One level Home Equipment: None      Prior Function Level of Independence: Independent         Comments: was picking berries prior to event     Hand Dominance   Dominant Hand: Right    Extremity/Trunk Assessment   Upper Extremity Assessment: Overall WFL for tasks assessed           Lower Extremity Assessment: Overall WFL for tasks assessed         Communication   Communication: No difficulties  Cognition Arousal/Alertness: Awake/alert Behavior During Therapy: WFL for tasks assessed/performed Overall Cognitive Status: Within Functional Limits for tasks assessed                      General Comments General comments (skin integrity, edema, etc.): educated on BEFAST stroke signs (VSS BP 542H systolic)    Exercises        Assessment/Plan    PT Assessment Patent does not need any further PT services  PT Diagnosis Difficulty walking   PT Problem List    PT Treatment Interventions     PT Goals (Current goals can be found in the Care Plan section) Acute Rehab PT Goals PT Goal Formulation: All assessment and  education complete, DC therapy    Frequency     Barriers to discharge        Co-evaluation               End of Session Equipment Utilized During Treatment: Gait belt Activity Tolerance: Patient tolerated treatment well Patient left: Other (comment) (with OT) Nurse Communication: Mobility status    Functional Assessment Tool Used: DGI Functional Limitation: Mobility: Walking and moving around Mobility: Walking and Moving Around Current Status [phone]): 0 percent impaired, limited or restricted Mobility: Walking and Moving Around Goal Status [phone]): 0 percent impaired, limited or restricted Mobility:  Walking and Moving Around Discharge Status [phone]): 0 percent impaired, limited or restricted    Time: 7207-2182 PT Time Calculation (min) (ACUTE ONLY): 24 min   Charges:   PT Evaluation $PT Eval Low Complexity: 1 Procedure     PT G Codes:   PT G-Codes **NOT FOR INPATIENT CLASS** Functional Assessment Tool Used: DGI Functional Limitation: Mobility: Walking and moving around Mobility: Walking and Moving Around Current Status (E8337): 0 percent impaired, limited or restricted Mobility: Walking and Moving Around Goal Status (O4514): 0 percent impaired, limited or restricted Mobility: Walking and Moving Around Discharge Status [phone]): 0 percent impaired, limited or restricted    Duncan Dull 09/12/2015, 11:33 AM Alben Deeds, PT DPT  [phone]

## 2015-09-13 ENCOUNTER — Inpatient Hospital Stay (HOSPITAL_COMMUNITY): Payer: Medicare Other

## 2015-09-13 DIAGNOSIS — I6789 Other cerebrovascular disease: Secondary | ICD-10-CM

## 2015-09-13 DIAGNOSIS — I639 Cerebral infarction, unspecified: Secondary | ICD-10-CM

## 2015-09-13 LAB — ECHOCARDIOGRAM COMPLETE
E decel time: 169 msec
EERAT: 8.23
FS: 17 % — AB (ref 28–44)
Height: 70 in
IVS/LV PW RATIO, ED: 1.29
LA ID, A-P, ES: 31 mm
LA vol A4C: 16.2 ml
LA vol index: 7.5 mL/m2
LADIAMINDEX: 1.64 cm/m2
LAVOL: 14.2 mL
LDCA: 3.8 cm2
LEFT ATRIUM END SYS DIAM: 31 mm
LV E/e' medial: 8.23
LV E/e'average: 8.23
LV PW d: 8.5 mm — AB (ref 0.6–1.1)
LV e' LATERAL: 7.07 cm/s
LVOTD: 22 mm
MV Dec: 169
MV pk E vel: 58.2 m/s
MVPKAVEL: 74.7 m/s
RV TAPSE: 20.6 mm
TDI e' lateral: 7.07
TDI e' medial: 5.33
Weight: 2548.8 oz

## 2015-09-13 LAB — VAS US CAROTID
LCCADDIAS: 19 cm/s
LCCADSYS: 86 cm/s
LCCAPDIAS: 17 cm/s
LEFT ECA DIAS: -21 cm/s
LEFT VERTEBRAL DIAS: -19 cm/s
LICADDIAS: -26 cm/s
LICAPDIAS: -21 cm/s
LICAPSYS: -75 cm/s
Left CCA prox sys: 75 cm/s
Left ICA dist sys: -100 cm/s
RCCAPDIAS: 16 cm/s
RCCAPSYS: 80 cm/s
RIGHT ECA DIAS: -15 cm/s
RIGHT VERTEBRAL DIAS: -18 cm/s
Right cca dist sys: -68 cm/s

## 2015-09-13 LAB — GLUCOSE, CAPILLARY
GLUCOSE-CAPILLARY: 98 mg/dL (ref 65–99)
GLUCOSE-CAPILLARY: 88 mg/dL (ref 65–99)
GLUCOSE-CAPILLARY: 98 mg/dL (ref 65–99)
GLUCOSE-CAPILLARY: 128 mg/dL — AB (ref 65–99)
GLUCOSE-CAPILLARY: 94 mg/dL (ref 65–99)

## 2015-09-13 NOTE — Progress Notes (Signed)
  Echocardiogram 2D Echocardiogram has been performed.  Cameron Roman M 09/13/2015, 10:51 AM

## 2015-09-13 NOTE — Progress Notes (Signed)
STROKE TEAM PROGRESS NOTE   HISTORY OF PRESENT ILLNESS (per record) Cameron Roman is an 65 y.o. male history diabetes mellitus and hypertension who is admitted with complaint of near syncopal episode on 09/10/2015. Patient felt lightheaded and dizzy as well as nauseated. He sat on the ground and was unable to get back to his feet initially. His wife described him as being very pale. There were no changes in speech. No facial droop was described. He had no extremity weakness at the time. He gives a history of transient left upper extremity weakness 2 days ago, for which he did not seek medical attention. 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 Patient has not been on antiplatelet therapy.  LSN: 09/10/2015 tPA Given: No: Beyond time window for treatment; no deficits mRankin:   SUBJECTIVE (INTERVAL HISTORY) Patient's wife and daughter at the bedside. He is stable and has no complains and is awaiting TEE and possible loop recorder.    OBJECTIVE Temp:  [97.6 F (36.4 C)-98.7 F (37.1 C)] 97.6 F (36.4 C) (07/02 0905) Pulse Rate:  [61-85] 74 (07/02 0905) Cardiac Rhythm:  [-] Normal sinus rhythm (07/02 0700) Resp:  [8-18] 15 (07/02 0905) BP: (136-163)/(77-100) 136/77 mmHg (07/02 0905) SpO2:  [97 %-100 %] 97 % (07/02 0905)  CBC:   Recent Labs Lab 09/11/15 1528 09/12/15 0440  WBC 12.9* 12.3*  NEUTROABS 6.1  --   HGB 14.3 14.1  HCT 43.3 42.4  MCV 98.6 100.2*  PLT 360 185    Basic Metabolic Panel:   Recent Labs Lab 09/11/15 1528 09/12/15 0440  NA 137 139  K 4.6 4.0  CL 108 107  CO2 25 26  GLUCOSE 100* 92  BUN 14 12  CREATININE 0.82 0.80  CALCIUM 9.1 9.0    Lipid Panel:     Component Value Date/Time   CHOL 140 09/12/2015 0440   CHOL 156 04/03/2015 0942   CHOL 127 08/30/2012 0955   TRIG 168* 09/12/2015 0440   TRIG 301* 04/16/2013 0917   TRIG 166* 08/30/2012 0955   HDL 34* 09/12/2015 0440   HDL 39*  04/03/2015 0942   HDL 38* 04/16/2013 0917   HDL 39* 08/30/2012 0955   CHOLHDL 4.1 09/12/2015 0440   CHOLHDL 4.0 04/03/2015 0942   VLDL 34 09/12/2015 0440   LDLCALC 72 09/12/2015 0440   LDLCALC 81 04/03/2015 0942   LDLCALC 58 04/16/2013 0917   LDLCALC 55 08/30/2012 0955   HgbA1c:  Lab Results  Component Value Date   HGBA1C 5.6 03/27/2015   Urine Drug Screen: No results found for: LABOPIA, COCAINSCRNUR, LABBENZ, AMPHETMU, THCU, LABBARB    IMAGING  Dg Chest 2 View 09/12/2015   No active cardiopulmonary disease.   Mr Brain Wo Contrast 09/11/2015   Bilateral cerebellar and occipital lobe nonhemorrhagic infarcts. Mild chronic microvascular changes. Mild global atrophy. C2-3 bulge mild spinal stenosis. Opacification right mastoid air cells. Suggestion prior left mastoid surgery.     Mr Jodene Nam Head/brain Wo Cm 09/11/2015   Normal intracranial MRA.    PHYSICAL EXAM Pleasant middle aged 64 male not in distress. . Afebrile. Head is nontraumatic. Neck is supple without bruit.    Cardiac exam no murmur or gallop. Lungs are clear to auscultation. Distal pulses are well felt. Neurological Exam ;  Awake  Alert oriented x 3. Normal speech and language.eye movements full without nystagmus.fundi were not visualized. Vision acuity and fields appear normal. Hearing is normal. Palatal movements are  normal. Face symmetric. Tongue midline. Normal strength, tone, reflexes and coordination. Normal sensation. Gait deferred.      ASSESSMENT/PLAN [name] is a 65 y.o. male with history of hearing loss, diabetes mellitus, and chronic back pain presenting with near syncope and transient left upper extremity weakness. He did not receive IV t-PA due to the presentation.  Strokes:  Bilateral infarcts felt to be embolic from an unknown source.  Resultant - resolution of deficits  MRI - Bilateral cerebellar and occipital lobe nonhemorrhagic infarcts.  MRA - normal  Carotid Doppler - No  significant extracranial stenosis 2D Echo - Left ventricle: The cavity size was normal. Wall thickness was  normal. Systolic function was normal. The estimated ejection  fraction was in the range of 55% to 60%. Wall motion was normal;   there were no regional wall motion abnormalities  LDL - 72  HgbA1c pending  VTE prophylaxis - Lovenox Diet heart healthy/carb modified Room service appropriate?: Yes; Fluid consistency:: Thin Diet NPO time specified Except for: Sips with Meds  No antithrombotic prior to admission, now on aspirin 325 mg daily  Patient counseled to be compliant with his antithrombotic medications  Ongoing aggressive stroke risk factor management  Therapy recommendations: No follow-up PT or OT recommended.  Disposition: Pending  Hypertension  Stable  Permissive hypertension (OK if < 220/120) but gradually normalize in 5-7 days  Long-term BP goal normotensive   Hyperlipidemia  Home meds:  Omega-3 fatty acids resumed in hospital  LDL 72, goal < 70   Diabetes  HgbA1c pending, goal < 7.0  Controlled    Other Stroke Risk Factors  Advanced age  Cigarette smoker - quit smoking 13 years ago  ETOH use, advised to drink no more than 1 to 2 drink(s) a day  Family hx stroke (maternal uncle)   Other Active Problems  Mild leukocytosis - afebrile   PLAN  TEE and possible loop Monday  Hospital day # 1    I have personally examined this patient, reviewed notes, independently viewed imaging studies, participated in medical decision making and plan of care. I have made any additions or clarifications directly to the above note.  Marland Kitchen He  presented with dizziness, nausea and weakness    due to  embolic occipital and cerebellar infarcts    .Recommend TEE and loop recorder on Monday for source of embolism .Continue aspirin for stroke prevention.Greater than  50% time during this 15 minite visit was spent on counselling and coordination of care about  stroke risk, prevention and treatment.    Antony Contras, MD Medical Director [facility] Pager: [phone] 09/13/2015 12:40 PM    To contact Stroke Continuity provider, please refer to [email or web address] After hours, contact General Neurology

## 2015-09-13 NOTE — Progress Notes (Signed)
    [facility] has been requested to perform a transesophageal echocardiogram on Cameron Roman for CVA.  After careful review of history and examination, the risks and benefits of transesophageal echocardiogram have been explained including risks of esophageal damage, perforation (1:10,000 risk), bleeding, pharyngeal hematoma as well as other potential complications associated with conscious sedation including aspiration, arrhythmia, respiratory failure and death. Alternatives to treatment were discussed, questions were answered. Patient is willing to proceed.   He does have hx of esophageal stricture dilated 3 years ago, has some difficulty swallowing but the same as post procedure.    Cecilie Kicks,  09/13/2015 3:23 PM

## 2015-09-13 NOTE — Progress Notes (Signed)
VASCULAR LAB PRELIMINARY  PRELIMINARY  PRELIMINARY  PRELIMINARY  Carotid duplex completed.    Preliminary report:  1-39% ICA plaquing.  Vertebral artery flow is antegrade.   [place] Lapaglia, RVT 09/13/2015, 12:27 PM

## 2015-09-13 NOTE — Progress Notes (Signed)
PROGRESS NOTE  Cameron Roman  MRN:[id number] DOB: 12/24/1950  DOA: 09/11/2015 PCP: Samuel Bradshaw, MD   Brief Narrative:  64-year-old male with PMH of diet-controlled DM 2, HTN, HLD, GERD, presented to ED with complaints of dizziness and nausea 24 hours PTA. He was picking berries on 09/10/15 morning when he suddenly felt dizzy, nauseous and had to lie down on the floor and was unable to get up for at least 15 minutes. After 15 minutes his symptoms resolved and he walked back to his house. He also had transient weakness of his left upper extremity. History of on and off left-sided blindness for few weeks with occipital headache. He had seen his outpatient neurologist and was undergoing workup. Admitted with acute ischemic stroke. Neurology consulting.   Assessment & Plan:   Principal Problem:   Acute ischemic stroke (HCC) Active Problems:   HTN (hypertension)   Stroke (cerebrum) (HCC)   Acute CVA (cerebrovascular accident) (HCC)   Acute stroke - Transient ataxia, LUE weakness and occipital headache-resolved. - Etiology: Embolic stroke  - MRI brain 09/11/15: Bilateral cerebellar and occipital lobe nonhemorrhagic infarcts. - MRA brain 09/11/15: Normal intracranial MRA. - 2-D echo: LVEF 55-60 percent. - Carotid Dopplers: 1-39 percent ICA plaquing. Vertebral artery flow is antegrade. - LDL: 72 - A1c: Pending - Discussed with stroke M.D.: Requests TEE and loop recorder. [facility] consulted for same. TEE on 7/3. - Not on antithrombotic prior to admission. Now on aspirin 325 MG daily for secondary stroke prophylaxis. - PT and OT do not see any needs.  Essential hypertension - Continue lisinopril. Allow for permissive hypertension. Mildly uncontrolled.  Headache - May be related to stroke. Seems to have resolved. Recently had occipital nerve block. ESR and CRP normal 3 days PTA.   Hyperlipidemia - LDL 72, goal <70. Consider statins. Continue omega-3  Diet-controlled DM 2  - Follow  A1c-pending.. Continue SSI.   GERD -PPI    DVT prophylaxis: Lovenox Code Status: Full Family Communication: Discussed with spouse and son at bedside on 7/2. Updated care and answered questions.  Disposition Plan: DC home after stroke workup completed and medically stable.    Consultants:   Neurology.   Procedures:   2-D echo 09/13/15: Study Conclusions  - Left ventricle: The cavity size was normal. Wall thickness was  normal. Systolic function was normal. The estimated ejection  fraction was in the range of 55% to 60%. Wall motion was normal;  there were no regional wall motion abnormalities. Doppler  parameters are consistent with abnormal left ventricular  relaxation (grade 1 diastolic dysfunction). - Aortic root: The aortic root was mildly dilated. - Atrial septum: There was increased thickness of the septum,  consistent with lipomatous hypertrophy.  Impressions:  - Normal LV systolic function; grade 1 diastolic dysfunction;  mildly dilated aortic root.  Antimicrobials:   None    Subjective: Denies complaints. No further strokelike symptoms. Feels bored.  Objective:  Filed Vitals:   09/12/15 2145 09/13/15 0123 09/13/15 0538 09/13/15 0905  BP: 163/99 159/91 143/100 136/77  Pulse: 70 61 85 74  Temp: 98.7 F (37.1 C) 97.8 F (36.6 C) 98.5 F (36.9 C) 97.6 F (36.4 C)  TempSrc: Oral Oral Oral Oral  Resp: 17 18 16 15  Height:      Weight:      SpO2: 98% 98% 100% 97%    Intake/Output Summary (Last 24 hours) at 09/13/15 1344 Last data filed at 09/12/15 1900  Gross per 24 hour  Intake      340 ml  Output      0 ml  Net    340 ml   Filed Weights   09/11/15 1333 09/11/15 2144  Weight: 73.483 kg (162 lb) 72.258 kg (159 lb 4.8 oz)    Examination:  General exam: Pleasant middle-aged male sitting up comfortably in chair. Family at bedside.  Respiratory system: Clear to auscultation. Respiratory effort normal. Cardiovascular system: S1 & S2  heard, RRR. No JVD, murmurs, rubs, gallops or clicks. No pedal edema. Telemetry: Sinus rhythm.  Gastrointestinal system: Abdomen is nondistended, soft and nontender. No organomegaly or masses felt. Normal bowel sounds heard. Central nervous system: Alert and oriented. No focal neurological deficits. Extremities: Symmetric 5 x 5 power. Skin: No rashes, lesions or ulcers Psychiatry: Judgement and insight appear normal. Mood & affect appropriate.     Data Reviewed: I have personally reviewed following labs and imaging studies  CBC:  Recent Labs Lab 09/11/15 1528 09/12/15 0440  WBC 12.9* 12.3*  NEUTROABS 6.1  --   HGB 14.3 14.1  HCT 43.3 42.4  MCV 98.6 100.2*  PLT 360 328   Basic Metabolic Panel:  Recent Labs Lab 09/11/15 1528 09/12/15 0440  NA 137 139  K 4.6 4.0  CL 108 107  CO2 25 26  GLUCOSE 100* 92  BUN 14 12  CREATININE 0.82 0.80  CALCIUM 9.1 9.0   GFR: Estimated Creatinine Clearance: 96.7 mL/min (by C-G formula based on Cr of 0.8). Liver Function Tests:  Recent Labs Lab 09/11/15 1528 09/12/15 0440  AST 28 25  ALT 23 20  ALKPHOS 95 78  BILITOT 0.3 0.9  PROT 6.8 6.4*  ALBUMIN 3.3* 2.9*   No results for input(s): LIPASE, AMYLASE in the last 168 hours. No results for input(s): AMMONIA in the last 168 hours. Coagulation Profile: No results for input(s): INR, PROTIME in the last 168 hours. Cardiac Enzymes: No results for input(s): CKTOTAL, CKMB, CKMBINDEX, TROPONINI in the last 168 hours. BNP (last 3 results) No results for input(s): PROBNP in the last 8760 hours. HbA1C: No results for input(s): HGBA1C in the last 72 hours. CBG:  Recent Labs Lab 09/12/15 1628 09/12/15 2145 09/13/15 0615 09/13/15 1248 09/13/15 1325  GLUCAP 119* 105* 98 88 98   Lipid Profile:  Recent Labs  09/12/15 0440  CHOL 140  HDL 34*  LDLCALC 72  TRIG 168*  CHOLHDL 4.1   Thyroid Function Tests: No results for input(s): TSH, T4TOTAL, FREET4, T3FREE, THYROIDAB in the  last 72 hours. Anemia Panel: No results for input(s): VITAMINB12, FOLATE, FERRITIN, TIBC, IRON, RETICCTPCT in the last 72 hours.  Sepsis Labs: No results for input(s): PROCALCITON, LATICACIDVEN in the last 168 hours.  No results found for this or any previous visit (from the past 240 hour(s)).       Radiology Studies: Dg Chest 2 View  09/12/2015  CLINICAL DATA:  63-year-old male with stroke. EXAM: CHEST  2 VIEW COMPARISON:  Chest radiograph dated 12/11/2012 FINDINGS: Two views of the chest demonstrate emphysematous changes of the lungs. There is no focal consolidation, pleural effusion, or pneumothorax. The cardiac silhouette is within normal limits. No acute osseous pathology. IMPRESSION: No active cardiopulmonary disease. Electronically Signed   By: Arash  Radparvar M.D.   On: 09/12/2015 01:51   Mr Brain Wo Contrast  09/11/2015  CLINICAL DATA:  64-year-old hypertensive diabetic male with lightheadedness yesterday with visual changes and dizziness. EXAM: MRI HEAD WITHOUT CONTRAST TECHNIQUE: Multiplanar, multiecho pulse sequences of the brain and surrounding structures were obtained   without intravenous contrast. COMPARISON:  07/23/2007 brain MR. FINDINGS: Bilateral cerebellar and occipital lobe nonhemorrhagic infarcts. Major intracranial vascular structures are patent. Scattered punctate and patchy white matter changes most consistent with result of small vessel disease. Mild global atrophy without hydrocephalus. No acute orbital abnormality. Prior surgeries C3-4 level. Bulge C2-3 mild spinal stenosis. Cervical medullary junction unremarkable. Opacification right mastoid air cells without obstructing lesion of the eustachian tube noted. Suggestion of prior left mastoid surgery. IMPRESSION: Bilateral cerebellar and occipital lobe nonhemorrhagic infarcts. Mild chronic microvascular changes. Mild global atrophy. C2-3 bulge mild spinal stenosis. Opacification right mastoid air cells. Suggestion prior  left mastoid surgery. These results were called by telephone at the time of interpretation on 09/11/2015 at 6:54 pm to Dr. Varney Biles , who verbally acknowledged these results. Electronically Signed   By: Genia Del M.D.   On: 09/11/2015 19:10   Mr Jodene Nam Head/brain Wo Cm  09/11/2015  CLINICAL DATA:  Initial evaluation for acute stroke. EXAM: MRA HEAD WITHOUT CONTRAST TECHNIQUE: Angiographic images of the Circle of Willis were obtained using MRA technique without intravenous contrast. COMPARISON:  Prior MRI from earlier the same day. FINDINGS: ANTERIOR CIRCULATION: Distal cervical segments of the internal carotid arteries are patent with antegrade flow. Distal right ICA mildly tortuous. Petrous, cavernous, and supraclinoid segments patent without flow-limiting stenosis. A1 segments, anterior communicating artery, and anterior cerebral arteries well opacified to their distal aspects. M1 segments patent without stenosis or occlusion. MCA bifurcations normal. Distal MCA branches well opacified and symmetric. Significant atheromatous disease appreciated within the anterior circulation. POSTERIOR CIRCULATION: Vertebral arteries widely patent to the vertebrobasilar junction. Vertebral arteries appear to be codominant. Posterior inferior cerebral arteries not visualized on this exam. Vertebrobasilar junction normal. Basilar artery widely patent without basilar tip stenosis or aneurysm. Superior cerebral arteries patent bilaterally. Both of the posterior cerebral arteries arise knee basilar artery and are well opacified to their distal aspects. No aneurysm or vascular malformation. IMPRESSION: Normal intracranial MRA. Electronically Signed   By: Jeannine Boga M.D.   On: 09/11/2015 23:32        Scheduled Meds: .  stroke: mapping our early stages of recovery book   Does not apply Once  . amitriptyline  50 mg Oral QHS  . aspirin  325 mg Oral Daily  . cyanocobalamin  1,000 mcg Intramuscular Daily  .  enoxaparin (LOVENOX) injection  40 mg Subcutaneous QHS  . fluticasone  2 spray Each Nare Daily  . gabapentin  300 mg Oral TID  . HYDROcodone-acetaminophen  1 tablet Oral TID  . insulin aspart  0-9 Units Subcutaneous TID WC  . lisinopril  5 mg Oral Daily  . omega-3 acid ethyl esters  2 g Oral BID  . pantoprazole  40 mg Oral Daily   Continuous Infusions:     LOS: 1 day    Time spent: 20 minutes.    [place], MD [facility] Pager [phone]  If 7PM-7AM, please contact night-coverage [email or web address] Password [email or web address] 09/13/2015, 1:44 PM

## 2015-09-14 ENCOUNTER — Encounter (HOSPITAL_COMMUNITY)
Admission: EM | Disposition: A | Discharge status: Home / Self Care | Payer: Self-pay | Source: Home / Self Care | Attending: Internal Medicine

## 2015-09-14 ENCOUNTER — Ambulatory Visit: Payer: Medicare Other

## 2015-09-14 ENCOUNTER — Encounter (HOSPITAL_COMMUNITY): Payer: Self-pay | Admitting: *Deleted

## 2015-09-14 ENCOUNTER — Inpatient Hospital Stay (HOSPITAL_COMMUNITY): Payer: Medicare Other

## 2015-09-14 DIAGNOSIS — I639 Cerebral infarction, unspecified: Secondary | ICD-10-CM

## 2015-09-14 HISTORY — PX: TEE WITHOUT CARDIOVERSION: SHX5443

## 2015-09-14 HISTORY — PX: EP IMPLANTABLE DEVICE: SHX172B

## 2015-09-14 LAB — BASIC METABOLIC PANEL
ANION GAP: 8 (ref 5–15)
BUN: 14 mg/dL (ref 6–20)
CALCIUM: 9.3 mg/dL (ref 8.9–10.3)
CO2: 26 mmol/L (ref 22–32)
CREATININE: 0.81 mg/dL (ref 0.61–1.24)
Chloride: 102 mmol/L (ref 101–111)
Glucose, Bld: 109 mg/dL — ABNORMAL HIGH (ref 65–99)
Potassium: 4.2 mmol/L (ref 3.5–5.1)
Sodium: 136 mmol/L (ref 135–145)

## 2015-09-14 LAB — GLUCOSE, CAPILLARY
GLUCOSE-CAPILLARY: 101 mg/dL — AB (ref 65–99)
GLUCOSE-CAPILLARY: 101 mg/dL — AB (ref 65–99)

## 2015-09-14 LAB — PROTIME-INR
INR: 0.96 (ref 0.00–1.49)
PROTHROMBIN TIME: 13 s (ref 11.6–15.2)

## 2015-09-14 LAB — HEMOGLOBIN A1C
Hgb A1c MFr Bld: 5.6 % (ref 4.8–5.6)
Mean Plasma Glucose: 114 mg/dL

## 2015-09-14 SURGERY — ECHOCARDIOGRAM, TRANSESOPHAGEAL
Anesthesia: Moderate Sedation

## 2015-09-14 SURGERY — LOOP RECORDER INSERTION
Anesthesia: LOCAL

## 2015-09-14 MED ORDER — FENTANYL CITRATE (PF) 100 MCG/2ML IJ SOLN
INTRAMUSCULAR | Status: AC
  Filled 2015-09-14: qty 2

## 2015-09-14 MED ORDER — LIDOCAINE-EPINEPHRINE 1 %-1:100000 IJ SOLN
INTRAMUSCULAR | Status: DC | PRN
  Administered 2015-09-14: 15 mL

## 2015-09-14 MED ORDER — HYDROCODONE-ACETAMINOPHEN 10-325 MG PO TABS
1.0000 | ORAL_TABLET | Freq: Three times a day (TID) | ORAL | Status: DC
  Administered 2015-09-14: 1 via ORAL
  Filled 2015-09-14: qty 1

## 2015-09-14 MED ORDER — ATORVASTATIN CALCIUM 10 MG PO TABS: 10.0000 mg | ORAL_TABLET | Freq: Every day | ORAL | Status: DC

## 2015-09-14 MED ORDER — LIDOCAINE-EPINEPHRINE 1 %-1:100000 IJ SOLN
INTRAMUSCULAR | Status: AC
  Filled 2015-09-14: qty 1

## 2015-09-14 MED ORDER — ASPIRIN 325 MG PO TABS
325.0000 mg | ORAL_TABLET | Freq: Every day | ORAL | Status: DC
  Administered 2015-09-14: 325 mg via ORAL
  Filled 2015-09-14: qty 1

## 2015-09-14 MED ORDER — LISINOPRIL 5 MG PO TABS
5.0000 mg | ORAL_TABLET | Freq: Every day | ORAL | Status: DC
  Administered 2015-09-14: 5 mg via ORAL
  Filled 2015-09-14: qty 1

## 2015-09-14 MED ORDER — MIDAZOLAM HCL 10 MG/2ML IJ SOLN
INTRAMUSCULAR | Status: DC | PRN
  Administered 2015-09-14: 1 mg via INTRAVENOUS
  Administered 2015-09-14: 3 mg via INTRAVENOUS

## 2015-09-14 MED ORDER — CYANOCOBALAMIN 1000 MCG/ML IJ SOLN
1000.0000 ug | Freq: Every day | INTRAMUSCULAR | Status: DC
  Administered 2015-09-14: 1000 ug via INTRAMUSCULAR
  Filled 2015-09-14: qty 1

## 2015-09-14 MED ORDER — PANTOPRAZOLE SODIUM 40 MG PO TBEC
40.0000 mg | DELAYED_RELEASE_TABLET | Freq: Every day | ORAL | Status: DC
  Administered 2015-09-14: 40 mg via ORAL
  Filled 2015-09-14: qty 1

## 2015-09-14 MED ORDER — FLUTICASONE PROPIONATE 50 MCG/ACT NA SUSP
2.0000 | Freq: Every day | NASAL | Status: DC
  Filled 2015-09-14: qty 16

## 2015-09-14 MED ORDER — OMEGA-3-ACID ETHYL ESTERS 1 G PO CAPS
2.0000 g | ORAL_CAPSULE | Freq: Two times a day (BID) | ORAL | Status: DC
  Administered 2015-09-14: 2 g via ORAL
  Filled 2015-09-14: qty 2

## 2015-09-14 MED ORDER — ASPIRIN EC 325 MG PO TBEC: 325.0000 mg | DELAYED_RELEASE_TABLET | Freq: Every day | ORAL | Status: DC

## 2015-09-14 MED ORDER — MIDAZOLAM HCL 5 MG/ML IJ SOLN
INTRAMUSCULAR | Status: AC
  Filled 2015-09-14: qty 2

## 2015-09-14 MED ORDER — INSULIN ASPART 100 UNIT/ML ~~LOC~~ SOLN: 0.0000 [IU] | Freq: Three times a day (TID) | SUBCUTANEOUS | Status: DC

## 2015-09-14 MED ORDER — SODIUM CHLORIDE 0.9 % IV SOLN
INTRAVENOUS | Status: DC
  Administered 2015-09-14: 09:00:00 via INTRAVENOUS

## 2015-09-14 MED ORDER — FENTANYL CITRATE (PF) 100 MCG/2ML IJ SOLN
INTRAMUSCULAR | Status: DC | PRN
  Administered 2015-09-14: 25 ug via INTRAVENOUS
  Administered 2015-09-14: 50 ug via INTRAVENOUS

## 2015-09-14 MED ORDER — GABAPENTIN 300 MG PO CAPS
300.0000 mg | ORAL_CAPSULE | Freq: Three times a day (TID) | ORAL | Status: DC
  Administered 2015-09-14: 300 mg via ORAL
  Filled 2015-09-14: qty 1

## 2015-09-14 MED ORDER — BUTAMBEN-TETRACAINE-BENZOCAINE 2-2-14 % EX AERO
INHALATION_SPRAY | CUTANEOUS | Status: DC | PRN
  Administered 2015-09-14: 1 via TOPICAL

## 2015-09-14 SURGICAL SUPPLY — 2 items
LOOP REVEAL LINQSYS (Prosthesis & Implant Heart) ×2 IMPLANT
PACK LOOP INSERTION (CUSTOM PROCEDURE TRAY) ×2 IMPLANT

## 2015-09-14 NOTE — H&P (View-Only) (Signed)
ELECTROPHYSIOLOGY CONSULT NOTE  Patient ID: Cameron Roman MRN: [id number], DOB/AGE: 1950-07-16   Admit date: 09/11/2015 Date of Consult: 09/14/2015  Primary Physician: Kenn File, MD Primary Cardiologist: new to [facility] Reason for Consultation: Cryptogenic stroke; recommendations regarding Implantable Loop Recorder  History of Present Illness Cameron Roman was admitted on 09/11/2015 with dizziness and nausea that occurred while picking berries.  He had to lie on the floor and was unable to stand for several minutes.  His symptoms resolved after 15 minutes.  He was seen by his neurologist who recommended ER evaluation.  Imaging demonstrated bilateral infarcts felt to be embolic from unknown source.  He has undergone workup for stroke including echocardiogram and carotid dopplers.  The patient has been monitored on telemetry which has demonstrated sinus rhythm with no arrhythmias.  Inpatient stroke work-up is to be completed with a TEE.   Echocardiogram this admission demonstrated EF 69-67%, grade 1 diastolic dysfunction, mildly dilated aortic root, LA 31.  Lab work is reviewed.  Prior to admission, the patient denies chest pain, shortness of breath, dizziness, palpitations, or syncope.  They are recovering from their stroke with plans to return home at discharge.  EP has been asked to evaluate for placement of an implantable loop recorder to monitor for atrial fibrillation.   Past Medical History  Diagnosis Date  . Hearing loss   . 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 Cough     hx smoking  . Headache(784.0)     headaches r/t cervical issues  . Arthritis   . Chronic back pain     buldging disc  . Chronic neck pain     ruptured disc  . GERD (gastroesophageal reflux disease)     takes Prilosec daily  . H/O hiatal hernia   . History of colon polyps   . Diabetes mellitus     doesn't take anything;diet and exercise controlled  . Insomnia     takes Elavil nightly  .  Diverticulosis   . Esophageal stricture   . Hiatal hernia      Surgical History:  Past Surgical History  Procedure Laterality Date  . Hand surgery Right 2005    right  . Lower back surgery  2009    had plates and screws inserted  . Neck surgery  2009    had insertion of  plates and screws  . Inner ear surgery Bilateral     bil;titanium in both ears  . Appendectomy      at age 21  . Colonoscopy    . Cardiac catheterization  06/03/04  . Anterior cervical decomp/discectomy fusion  04/12/2011    Procedure: ANTERIOR CERVICAL DECOMPRESSION/DISCECTOMY FUSION 2 LEVELS;  Surgeon: Peggyann Shoals, MD;  Location: [place] [facility];  Service: Neurosurgery;  Laterality: N/A;  exploration of Cervical four - seven  fusion with redo Cervical six- seven, cervical three- four anterior cervical decompression with fusion interbody prothesis plating and bonegraft and C34 anterior cervical decompression with inte     Facility-administered medications prior to admission  Medication Dose Route Frequency Provider Last Rate Last Dose  . cyanocobalamin ((VITAMIN B-12)) injection 1,000 mcg  1,000 mcg Intramuscular Daily Timmothy Euler, MD   1,000 mcg at 09/10/15 8938   Prescriptions prior to admission  Medication Sig Dispense Refill Last Dose  . amitriptyline (ELAVIL) 50 MG tablet Take 50 mg by mouth at bedtime.    09/10/2015 at Unknown time  . Cyanocobalamin (B-12) 1000 MCG/ML KIT Inject 1 mL as  directed See admin instructions. Started 09/10/15, 1 injection daily for 7 days them reduce to 1 injection once weekly, then eventually to once monthly   09/10/2015 at Unknown time  . fluticasone (FLONASE) 50 MCG/ACT nasal spray Place 2 sprays into both nostrils daily. 16 g 6 09/11/2015 at Unknown time  . gabapentin (NEURONTIN) 300 MG capsule Take 300 mg by mouth Three times a day.    09/11/2015 at 1 dose  . HYDROcodone-acetaminophen (NORCO) 10-325 MG tablet Take 1 tablet by mouth 3 (three) times daily.   09/11/2015 at 1 dose    . lisinopril (PRINIVIL,ZESTRIL) 5 MG tablet Take 1 tablet (5 mg total) by mouth daily. 90 tablet 1 09/11/2015 at Unknown time  . Omega-3 Fatty Acids (FISH OIL) 1000 MG CAPS Take 2,000 mg by mouth 2 (two) times daily.   09/11/2015 at Unknown time  . omeprazole (PRILOSEC) 20 MG capsule Take 1 capsule (20 mg total) by mouth daily. 30 capsule 11 09/11/2015 at Unknown time  . sildenafil (REVATIO) 20 MG tablet TAKE 2 TO 4 TABLETS AS NEEDED 20 tablet 3 Past Month at Unknown time    Inpatient Medications:  .  stroke: mapping our early stages of recovery book   Does not apply Once  . amitriptyline  50 mg Oral QHS  . aspirin  325 mg Oral Daily  . cyanocobalamin  1,000 mcg Intramuscular Daily  . enoxaparin (LOVENOX) injection  40 mg Subcutaneous QHS  . fluticasone  2 spray Each Nare Daily  . gabapentin  300 mg Oral TID  . HYDROcodone-acetaminophen  1 tablet Oral TID  . insulin aspart  0-9 Units Subcutaneous TID WC  . lisinopril  5 mg Oral Daily  . omega-3 acid ethyl esters  2 g Oral BID  . pantoprazole  40 mg Oral Daily    Allergies:  Allergies  Allergen Reactions  . Percocet [Oxycodone-Acetaminophen] Other (See Comments)    Hallucinations, inability to sleep.    Social History   Social History  . Marital Status: Married    Spouse Name: N/A  . Number of Children: 2  . Years of Education: N/A   Occupational History  . disabled    Social History Main Topics  . Smoking status: Former Smoker -- 1.00 packs/day for 41 years    Types: Cigarettes    Quit date: 06/29/2002  . Smokeless tobacco: Never Used     Comment: quit 10yrs ago  . Alcohol Use: 0.0 oz/week    0 Standard drinks or equivalent per week     Comment: occasional less than one a week  . Drug Use: No  . Sexual Activity: Yes   Other Topics Concern  . Not on file   Social History Narrative   Lives with wife in a one story home.  Has 2 children.     Currently does not work.  On disability for wrist pain since early 2000s.    Formerly a truck driver.     Family History  Problem Relation Age of Onset  . Colon cancer Father   . Anesthesia problems Neg Hx   . Hypotension Neg Hx   . Malignant hyperthermia Neg Hx   . Pseudochol deficiency Neg Hx   . Colon cancer Maternal Grandmother   . Colon cancer Maternal Grandfather   . Heart disease Maternal Uncle   . Stroke Maternal Uncle   . Other Mother     Perforated bowel  . Cancer Brother   . Healthy Son   .   Healthy Daughter       Review of Systems: All other systems reviewed and are otherwise negative except as noted above.  Physical Exam: Filed Vitals:   09/13/15 1744 09/13/15 2130 09/14/15 0144 09/14/15 0539  BP: 148/89 151/92 147/94 151/95  Pulse: 79 83 90 84  Temp: 98.5 F (36.9 C) 98.6 F (37 C) 98 F (36.7 C) 97.5 F (36.4 C)  TempSrc: Oral Oral Oral Oral  Resp: _0 Height:      Weight:      SpO2: 98% 96% 99% 97%    GEN- The patient is well appearing, alert and oriented x 3 today.   Head- normocephalic, atraumatic Eyes-  Sclera clear, conjunctiva pink Ears- hearing intact Oropharynx- clear Neck- supple Lungs- Clear to ausculation bilaterally, normal work of breathing Heart- Regular rate and rhythm, no murmurs, rubs or gallops  GI- soft, NT, ND, + BS Extremities- no clubbing, cyanosis, or edema MS- no significant deformity or atrophy Skin- no rash or lesion Psych- euthymic mood, full affect   Labs:   Lab Results  Component Value Date   WBC 12.3* 09/12/2015   HGB 14.1 09/12/2015   HCT 42.4 09/12/2015   MCV 100.2* 09/12/2015   PLT 328 09/12/2015     Recent Labs Lab 09/12/15 0440  NA 139  K 4.0  CL 107  CO2 26  BUN 12  CREATININE 0.80  CALCIUM 9.0  PROT 6.4*  BILITOT 0.9  ALKPHOS 78  ALT 20  AST 25  GLUCOSE 92     Radiology/Studies: Dg Chest 2 View 09/12/2015  CLINICAL DATA:  65 year old male with stroke. EXAM: CHEST  2 VIEW COMPARISON:  Chest radiograph dated 12/11/2012 FINDINGS: Two views of the  chest demonstrate emphysematous changes of the lungs. There is no focal consolidation, pleural effusion, or pneumothorax. The cardiac silhouette is within normal limits. No acute osseous pathology. IMPRESSION: No active cardiopulmonary disease. Electronically Signed   By: Anner Crete M.D.   On: 09/12/2015 01:51   Mr Brain Wo Contrast 09/11/2015  CLINICAL DATA:  65 year old hypertensive diabetic male with lightheadedness yesterday with visual changes and dizziness. EXAM: MRI HEAD WITHOUT CONTRAST TECHNIQUE: Multiplanar, multiecho pulse sequences of the brain and surrounding structures were obtained without intravenous contrast. COMPARISON:  07/23/2007 brain MR. FINDINGS: Bilateral cerebellar and occipital lobe nonhemorrhagic infarcts. Major intracranial vascular structures are patent. Scattered punctate and patchy white matter changes most consistent with result of small vessel disease. Mild global atrophy without hydrocephalus. No acute orbital abnormality. Prior surgeries C3-4 level. Bulge C2-3 mild spinal stenosis. Cervical medullary junction unremarkable. Opacification right mastoid air cells without obstructing lesion of the eustachian tube noted. Suggestion of prior left mastoid surgery. IMPRESSION: Bilateral cerebellar and occipital lobe nonhemorrhagic infarcts. Mild chronic microvascular changes. Mild global atrophy. C2-3 bulge mild spinal stenosis. Opacification right mastoid air cells. Suggestion prior left mastoid surgery. These results were called by telephone at the time of interpretation on 09/11/2015 at 6:54 pm to Dr. Varney Biles , who verbally acknowledged these results. Electronically Signed   By: Genia Del M.D.   On: 09/11/2015 19:10   12-lead ECG sinus rhythm   Telemetry sinus rhythm  Assessment and Plan:  1. Cryptogenic stroke The patient presents with cryptogenic stroke.  The patient has a TEE planned for this AM.  I spoke at length with the patient about monitoring for afib  with an implantable loop recorder.  Risks, benefits, and alteratives to implantable loop recorder were discussed with the patient  today.   At this time, the patient is very clear in their decision to proceed with implantable loop recorder.   Wound care was reviewed with the patient (keep incision clean and dry for 3 days).  Wound check scheduled for 09/28/15 at 10:30AM   Please call with questions.   Chanetta Marshall, NP 09/14/2015 9:08 AM   EP Attending  Patient seen and examined. Agree with above. On exam he has a RRR, clear lungs and no edema. Abdomen is soft and non-tender. ECG - nsr. Tee - no clot or obvious stroke etiology.  A/P 1. Cryptogenic stroke - I have discussed the treatment options with the patient and he wishes to proceed with ILR.  Mikle Bosworth.D.

## 2015-09-14 NOTE — CV Procedure (Signed)
    Transesophageal Echocardiogram Note  Cameron Roman [id number] 06/02/1950  Procedure: Transesophageal Echocardiogram Indications: CVA  Procedure Details Consent: Obtained Time Out: Verified patient identification, verified procedure, site/side was marked, verified correct patient position, special equipment/implants available, Radiology Safety Procedures followed,  medications/allergies/relevent history reviewed, required imaging and test results available.  Performed  Medications:  During this procedure the patient is administered a total of Versed 4 mg and Fentanyl 75 mcg  to achieve and maintain moderate conscious sedation.  The patient's heart rate, blood pressure, and oxygen saturation are monitored continuously during the procedure. The period of conscious sedation is 30 minutes, of which I was present face-to-face 100% of this time.  Left Ventrical:  Normal LV function  Mitral Valve: normal , trace MR   Aortic Valve: normal   Aorta:   Mild dilatation of the aortic root  Tricuspid Valve: poorly visualized,  No significant TR   Pulmonic Valve: poorly visualized   Left Atrium/ Left atrial appendage: no thrombi   Atrial septum: no ASD pr PFO by color doppler or bubble study   Aorta:  Mild calcification    Complications: No apparent complications Patient did tolerate procedure well.   Thayer Headings, Cameron Roman., MD, [facility] 09/14/2015, 10:17 AM

## 2015-09-14 NOTE — H&P (View-Only) (Signed)
STROKE TEAM PROGRESS NOTE   HISTORY OF PRESENT ILLNESS (per record) Cameron Roman is an 65 y.o. male history diabetes mellitus and hypertension who is admitted with complaint of near syncopal episode on 09/10/2015. Patient felt lightheaded and dizzy as well as nauseated. He sat on the ground and was unable to get back to his feet initially. His wife described him as being very pale. There were no changes in speech. No facial droop was described. He had no extremity weakness at the time. He gives a history of transient left upper extremity weakness 2 days ago, for which he did not seek medical attention. 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 Patient has not been on antiplatelet therapy.  LSN: 09/10/2015 tPA Given: No: Beyond time window for treatment; no deficits mRankin:   SUBJECTIVE (INTERVAL HISTORY) Patient's wife and daughter at the bedside. He is stable and has no complains and is awaiting TEE and possible loop recorder.    OBJECTIVE Temp:  [97.6 F (36.4 C)-98.7 F (37.1 C)] 97.6 F (36.4 C) (07/02 0905) Pulse Rate:  [61-85] 74 (07/02 0905) Cardiac Rhythm:  [-] Normal sinus rhythm (07/02 0700) Resp:  [8-18] 15 (07/02 0905) BP: (136-163)/(77-100) 136/77 mmHg (07/02 0905) SpO2:  [97 %-100 %] 97 % (07/02 0905)  CBC:   Recent Labs Lab 09/11/15 1528 09/12/15 0440  WBC 12.9* 12.3*  NEUTROABS 6.1  --   HGB 14.3 14.1  HCT 43.3 42.4  MCV 98.6 100.2*  PLT 360 557    Basic Metabolic Panel:   Recent Labs Lab 09/11/15 1528 09/12/15 0440  NA 137 139  K 4.6 4.0  CL 108 107  CO2 25 26  GLUCOSE 100* 92  BUN 14 12  CREATININE 0.82 0.80  CALCIUM 9.1 9.0    Lipid Panel:     Component Value Date/Time   CHOL 140 09/12/2015 0440   CHOL 156 04/03/2015 0942   CHOL 127 08/30/2012 0955   TRIG 168* 09/12/2015 0440   TRIG 301* 04/16/2013 0917   TRIG 166* 08/30/2012 0955   HDL 34* 09/12/2015 0440   HDL 39*  04/03/2015 0942   HDL 38* 04/16/2013 0917   HDL 39* 08/30/2012 0955   CHOLHDL 4.1 09/12/2015 0440   CHOLHDL 4.0 04/03/2015 0942   VLDL 34 09/12/2015 0440   LDLCALC 72 09/12/2015 0440   LDLCALC 81 04/03/2015 0942   LDLCALC 58 04/16/2013 0917   LDLCALC 55 08/30/2012 0955   HgbA1c:  Lab Results  Component Value Date   HGBA1C 5.6 03/27/2015   Urine Drug Screen: No results found for: LABOPIA, COCAINSCRNUR, LABBENZ, AMPHETMU, THCU, LABBARB    IMAGING  Dg Chest 2 View 09/12/2015   No active cardiopulmonary disease.   Mr Brain Wo Contrast 09/11/2015   Bilateral cerebellar and occipital lobe nonhemorrhagic infarcts. Mild chronic microvascular changes. Mild global atrophy. C2-3 bulge mild spinal stenosis. Opacification right mastoid air cells. Suggestion prior left mastoid surgery.     Mr Jodene Nam Head/brain Wo Cm 09/11/2015   Normal intracranial MRA.    PHYSICAL EXAM Pleasant middle aged 17 male not in distress. . Afebrile. Head is nontraumatic. Neck is supple without bruit.    Cardiac exam no murmur or gallop. Lungs are clear to auscultation. Distal pulses are well felt. Neurological Exam ;  Awake  Alert oriented x 3. Normal speech and language.eye movements full without nystagmus.fundi were not visualized. Vision acuity and fields appear normal. Hearing is normal. Palatal movements are  normal. Face symmetric. Tongue midline. Normal strength, tone, reflexes and coordination. Normal sensation. Gait deferred.      ASSESSMENT/PLAN [name] is a 65 y.o. male with history of hearing loss, diabetes mellitus, and chronic back pain presenting with near syncope and transient left upper extremity weakness. He did not receive IV t-PA due to the presentation.  Strokes:  Bilateral infarcts felt to be embolic from an unknown source.  Resultant - resolution of deficits  MRI - Bilateral cerebellar and occipital lobe nonhemorrhagic infarcts.  MRA - normal  Carotid Doppler - No  significant extracranial stenosis 2D Echo - Left ventricle: The cavity size was normal. Wall thickness was  normal. Systolic function was normal. The estimated ejection  fraction was in the range of 55% to 60%. Wall motion was normal;   there were no regional wall motion abnormalities  LDL - 72  HgbA1c pending  VTE prophylaxis - Lovenox Diet heart healthy/carb modified Room service appropriate?: Yes; Fluid consistency:: Thin Diet NPO time specified Except for: Sips with Meds  No antithrombotic prior to admission, now on aspirin 325 mg daily  Patient counseled to be compliant with his antithrombotic medications  Ongoing aggressive stroke risk factor management  Therapy recommendations: No follow-up PT or OT recommended.  Disposition: Pending  Hypertension  Stable  Permissive hypertension (OK if < 220/120) but gradually normalize in 5-7 days  Long-term BP goal normotensive   Hyperlipidemia  Home meds:  Omega-3 fatty acids resumed in hospital  LDL 72, goal < 70   Diabetes  HgbA1c pending, goal < 7.0  Controlled    Other Stroke Risk Factors  Advanced age  Cigarette smoker - quit smoking 13 years ago  ETOH use, advised to drink no more than 1 to 2 drink(s) a day  Family hx stroke (maternal uncle)   Other Active Problems  Mild leukocytosis - afebrile   PLAN  TEE and possible loop Monday  Hospital day # 1    I have personally examined this patient, reviewed notes, independently viewed imaging studies, participated in medical decision making and plan of care. I have made any additions or clarifications directly to the above note.  Marland Kitchen He  presented with dizziness, nausea and weakness    due to  embolic occipital and cerebellar infarcts    .Recommend TEE and loop recorder on Monday for source of embolism .Continue aspirin for stroke prevention.Greater than  50% time during this 15 minite visit was spent on counselling and coordination of care about  stroke risk, prevention and treatment.    Cameron Contras, MD Medical Director [facility] Pager: [phone] 09/13/2015 12:40 PM    To contact Stroke Continuity provider, please refer to [email or web address] After hours, contact General Neurology

## 2015-09-14 NOTE — Discharge Summary (Signed)
Physician Discharge Summary  Cameron Roman NUU:[id number] DOB: [birth year]  PCP: Kenn File, MD  Admit date: 09/11/2015 Discharge date: 09/14/2015  Admitted From: Home Disposition:  Home  Recommendations for Outpatient Follow-up:  1. [facility] on 09/28/15 at 10:30 AM for wound check. 2. Dr. Antony Contras, Neurology in 2 months. MDs office will call with appointment. 3. Dr. Kenn File, PCP in 1 week.  Home Health: None Equipment/Devices: None    Discharge Condition: Improved and stable.  CODE STATUS: Full  Diet recommendation: Heart healthy diet.  Brief/Interim Summary: 65 year old male with PMH of diet-controlled DM 2, HTN, HLD, GERD, hearing loss, chronic back pain presented to ED with complaints of dizziness and nausea 24 hours PTA. He was picking berries on 09/10/15 morning when he suddenly felt dizzy, nauseous and had to lie down on the floor and was unable to get up for at least 15 minutes. After 15 minutes his symptoms resolved and he walked back to his house. He also had transient weakness of his left upper extremity. History of on and off left-sided blindness for few weeks with occipital headache. He had seen his outpatient neurologist and was undergoing workup. Admitted with acute ischemic stroke.   Discharge Diagnoses:  Principal Problem:   Acute ischemic stroke [facility]) Active Problems:   HTN (hypertension)   Stroke (cerebrum) (HCC)   Acute CVA (cerebrovascular accident) ([facility])  Acute stroke - Transient ataxia, LUE weakness and occipital headache-resolved. - Etiology: Embolic stroke-unknown source/cryptogenic stroke.  - MRI brain 09/11/15: Bilateral cerebellar and occipital lobe nonhemorrhagic infarcts. - MRA brain 09/11/15: Normal intracranial MRA. - 2-D echo: LVEF 55-60 percent. - Carotid Dopplers: 1-39 percent ICA plaquing. Vertebral artery flow is antegrade. - TEE: No source of emboli identified. - LDL: 72 - A1c: 5.6. - Not on antithrombotic prior to  admission. Now on aspirin 325 MG daily for secondary stroke prophylaxis. - Loop recorder placed by cardiology 7/3. - PT and OT do not see any needs. - Discussed with stroke team and cleared for discharge home with outpatient follow-up in 2 months.  Essential hypertension - Continue lisinopril. Allow for permissive hypertension. Mildly uncontrolled.  Headache - May be related to stroke. Resolved. Recently had occipital nerve block. ESR and CRP normal 3 days PTA.   Hyperlipidemia - LDL 72, goal <70. Lipitor started in the hospital. Continue omega-3  Diet-controlled DM 2  - Follow A1c: 5.6  GERD -PPI    Consultants:   Neurology.  Cardiology  Procedures:   2-D echo 09/13/15: Study Conclusions  - Left ventricle: The cavity size was normal. Wall thickness was  normal. Systolic function was normal. The estimated ejection  fraction was in the range of 55% to 60%. Wall motion was normal;  there were no regional wall motion abnormalities. Doppler  parameters are consistent with abnormal left ventricular  relaxation (grade 1 diastolic dysfunction). - Aortic root: The aortic root was mildly dilated. - Atrial septum: There was increased thickness of the septum,  consistent with lipomatous hypertrophy.  Impressions:  - Normal LV systolic function; grade 1 diastolic dysfunction;  mildly dilated aortic root.   TEE 09/14/15: Left Ventrical: Normal LV function Mitral Valve: normal , trace MR  Aortic Valve: normal  Aorta: Mild dilatation of the aortic root Tricuspid Valve: poorly visualized, No significant TR  Pulmonic Valve: poorly visualized  Left Atrium/ Left atrial appendage: no thrombi  Atrial septum: no ASD pr PFO by color doppler or bubble study  Aorta: Mild calcification    Loop recorder  placed by cardiology on 09/14/15.  Discharge Instructions      Discharge Instructions    Ambulatory referral to Neurology    Complete by:  As directed   Please  schedule post stroke follow up in 2 months.     Call MD for:  redness, tenderness, or signs of infection (pain, swelling, redness, odor or green/yellow discharge around incision site)    Complete by:  As directed      Call MD for:  severe uncontrolled pain    Complete by:  As directed      Call MD for:  temperature >100.4    Complete by:  As directed      Call MD for:    Complete by:  As directed   Strokelike symptoms.     Diet - low sodium heart healthy    Complete by:  As directed      Increase activity slowly    Complete by:  As directed             Medication List    TAKE these medications        amitriptyline 50 MG tablet  Commonly known as:  ELAVIL  Take 50 mg by mouth at bedtime.     aspirin EC 325 MG tablet  Take 1 tablet (325 mg total) by mouth daily.     atorvastatin 10 MG tablet  Commonly known as:  LIPITOR  Take 1 tablet (10 mg total) by mouth daily at 6 PM.     B-12 1000 MCG/ML Kit  Inject 1 mL as directed See admin instructions. Started 09/10/15, 1 injection daily for 7 days them reduce to 1 injection once weekly, then eventually to once monthly     Fish Oil 1000 MG Caps  Take 2,000 mg by mouth 2 (two) times daily.     fluticasone 50 MCG/ACT nasal spray  Commonly known as:  FLONASE  Place 2 sprays into both nostrils daily.     gabapentin 300 MG capsule  Commonly known as:  NEURONTIN  Take 300 mg by mouth Three times a day.     HYDROcodone-acetaminophen 10-325 MG tablet  Commonly known as:  NORCO  Take 1 tablet by mouth 3 (three) times daily.     lisinopril 5 MG tablet  Commonly known as:  PRINIVIL,ZESTRIL  Take 1 tablet (5 mg total) by mouth daily.     omeprazole 20 MG capsule  Commonly known as:  PRILOSEC  Take 1 capsule (20 mg total) by mouth daily.     sildenafil 20 MG tablet  Commonly known as:  REVATIO  TAKE 2 TO 4 TABLETS AS NEEDED       Follow-up Information    Follow up with [facility] On 09/28/2015.    Specialty:  Cardiology   Why:  at 10:30AM for wound check    Contact information:   [address] [place] [phone]      Follow up with Antony Contras, MD In 2 months.   Specialties:  Neurology, Radiology   Why:  Stroke Clinic   Contact information:   [address] [place] [address] [phone]       Follow up with Kenn File, MD. Schedule an appointment as soon as possible for a visit in 1 week.   Specialty:  Family Medicine   Contact information:   [place] [address] [phone]      Allergies  Allergen Reactions  . Percocet [Oxycodone-Acetaminophen] Other (See Comments)    Hallucinations, inability to sleep.    Procedures/Studies: Dg Chest 2 View  09/12/2015  CLINICAL DATA:  65 year old male with stroke. EXAM: CHEST  2 VIEW COMPARISON:  Chest radiograph dated 12/11/2012 FINDINGS: Two views of the chest demonstrate emphysematous changes of the lungs. There is no focal consolidation, pleural effusion, or pneumothorax. The cardiac silhouette is within normal limits. No acute osseous pathology. IMPRESSION: No active cardiopulmonary disease. Electronically Signed   By: Anner Crete M.D.   On: 09/12/2015 01:51   Mr Brain Wo Contrast  09/11/2015  CLINICAL DATA:  65 year old hypertensive diabetic male with lightheadedness yesterday with visual changes and dizziness. EXAM: MRI HEAD WITHOUT CONTRAST TECHNIQUE: Multiplanar, multiecho pulse sequences of the brain and surrounding structures were obtained without intravenous contrast. COMPARISON:  07/23/2007 brain MR. FINDINGS: Bilateral cerebellar and occipital lobe nonhemorrhagic infarcts. Major intracranial vascular structures are patent. Scattered punctate and patchy white matter changes most consistent with result of small vessel disease. Mild global atrophy without hydrocephalus. No acute orbital abnormality. Prior surgeries C3-4 level. Bulge C2-3 mild  spinal stenosis. Cervical medullary junction unremarkable. Opacification right mastoid air cells without obstructing lesion of the eustachian tube noted. Suggestion of prior left mastoid surgery. IMPRESSION: Bilateral cerebellar and occipital lobe nonhemorrhagic infarcts. Mild chronic microvascular changes. Mild global atrophy. C2-3 bulge mild spinal stenosis. Opacification right mastoid air cells. Suggestion prior left mastoid surgery. These results were called by telephone at the time of interpretation on 09/11/2015 at 6:54 pm to Dr. Varney Biles , who verbally acknowledged these results. Electronically Signed   By: Genia Del M.D.   On: 09/11/2015 19:10   Mr Jodene Nam Head/brain Wo Cm  09/11/2015  CLINICAL DATA:  Initial evaluation for acute stroke. EXAM: MRA HEAD WITHOUT CONTRAST TECHNIQUE: Angiographic images of the Circle of Willis were obtained using MRA technique without intravenous contrast. COMPARISON:  Prior MRI from earlier the same day. FINDINGS: ANTERIOR CIRCULATION: Distal cervical segments of the internal carotid arteries are patent with antegrade flow. Distal right ICA mildly tortuous. Petrous, cavernous, and supraclinoid segments patent without flow-limiting stenosis. A1 segments, anterior communicating artery, and anterior cerebral arteries well opacified to their distal aspects. M1 segments patent without stenosis or occlusion. MCA bifurcations normal. Distal MCA branches well opacified and symmetric. Significant atheromatous disease appreciated within the anterior circulation. POSTERIOR CIRCULATION: Vertebral arteries widely patent to the vertebrobasilar junction. Vertebral arteries appear to be codominant. Posterior inferior cerebral arteries not visualized on this exam. Vertebrobasilar junction normal. Basilar artery widely patent without basilar tip stenosis or aneurysm. Superior cerebral arteries patent bilaterally. Both of the posterior cerebral arteries arise knee basilar artery and are  well opacified to their distal aspects. No aneurysm or vascular malformation. IMPRESSION: Normal intracranial MRA. Electronically Signed   By: Jeannine Boga M.D.   On: 09/11/2015 23:32      Subjective: Patient denies complaints. No recurrence of strokelike symptoms. Anxious to go home.  Discharge Exam:  Filed Vitals:   09/14/15 1030 09/14/15 1040 09/14/15 1050 09/14/15 1345  BP: 142/115 155/94 148/98 143/89  Pulse: 84 85 90 96  Temp:    98.6 F (37 C)  TempSrc:    Oral  Resp: _0 Height:      Weight:      SpO2: 96% 97% 95% 96%    General exam: Pleasant middle-aged male sitting up comfortably in bed. Respiratory system: Clear to auscultation. Respiratory effort normal. Cardiovascular system: S1 &  S2 heard, RRR. No JVD, murmurs, rubs, gallops or clicks. No pedal edema. Telemetry: Sinus rhythm.  Gastrointestinal system: Abdomen is nondistended, soft and nontender. No organomegaly or masses felt. Normal bowel sounds heard. Central nervous system: Alert and oriented. No focal neurological deficits. Extremities: Symmetric 5 x 5 power. Skin: No rashes, lesions or ulcers Psychiatry: Judgement and insight appear normal. Mood & affect appropriate.     The results of significant diagnostics from this hospitalization (including imaging, microbiology, ancillary and laboratory) are listed below for reference.     Microbiology: No results found for this or any previous visit (from the past 240 hour(s)).   Labs: BNP (last 3 results) No results for input(s): BNP in the last 8760 hours. Basic Metabolic Panel:  Recent Labs Lab 09/11/15 1528 09/12/15 0440 09/14/15 1138  NA 137 139 136  K 4.6 4.0 4.2  CL 108 107 102  CO2 _0 GLUCOSE 100* 92 109*  BUN _1 CREATININE 0.82 0.80 0.81  CALCIUM 9.1 9.0 9.3   Liver Function Tests:  Recent Labs Lab 09/11/15 1528 09/12/15 0440  AST 28 25  ALT 23 20  ALKPHOS 95 78  BILITOT 0.3 0.9  PROT 6.8 6.4*   ALBUMIN 3.3* 2.9*   No results for input(s): LIPASE, AMYLASE in the last 168 hours. No results for input(s): AMMONIA in the last 168 hours. CBC:  Recent Labs Lab 09/11/15 1528 09/12/15 0440  WBC 12.9* 12.3*  NEUTROABS 6.1  --   HGB 14.3 14.1  HCT 43.3 42.4  MCV 98.6 100.2*  PLT 360 328   Cardiac Enzymes: No results for input(s): CKTOTAL, CKMB, CKMBINDEX, TROPONINI in the last 168 hours. BNP: Invalid input(s): POCBNP CBG:  Recent Labs Lab 09/13/15 1325 09/13/15 1700 09/13/15 2134 09/14/15 0629 09/14/15 1137  GLUCAP 98 128* 94 101* 101*   D-Dimer No results for input(s): DDIMER in the last 72 hours. Hgb A1c  Recent Labs  09/12/15 0440  HGBA1C 5.6   Lipid Profile  Recent Labs  09/12/15 0440  CHOL 140  HDL 34*  LDLCALC 72  TRIG 168*  CHOLHDL 4.1   Thyroid function studies No results for input(s): TSH, T4TOTAL, T3FREE, THYROIDAB in the last 72 hours.  Invalid input(s): FREET3 Anemia work up No results for input(s): VITAMINB12, FOLATE, FERRITIN, TIBC, IRON, RETICCTPCT in the last 72 hours. Urinalysis No results found for: COLORURINE, APPEARANCEUR, LABSPEC, [place], GLUCOSEU, HGBUR, BILIRUBINUR, KETONESUR, PROTEINUR, UROBILINOGEN, NITRITE, LEUKOCYTESUR Sepsis Labs Invalid input(s): PROCALCITONIN,  WBC,  LACTICIDVEN    Time coordinating discharge: Over 30 minutes  SIGNED:  Vernell Leep, MD, FACP, FHM. [facility] Pager [phone]  If 7PM-7AM, please contact night-coverage [email or web address] Password [email or web address] 09/14/2015, 3:06 PM

## 2015-09-14 NOTE — Care Management Important Message (Signed)
Important Message  Patient Details  Name: Cameron Roman MRN: [id number] Date of Birth: Apr 12, 1950   Medicare Important Message Given:  Yes    [name] 09/14/2015, 11:25 AM

## 2015-09-14 NOTE — Progress Notes (Signed)
RN discussed discharge instructions with patient including follow up appointments, medications. Patient vocalized understanding of discharge instructions. NIHHS unchanged, neuro assessment unchanged. Dressing has old drainage.

## 2015-09-14 NOTE — Interval H&P Note (Signed)
History and Physical Interval Note:  09/14/2015 11:10 AM  Cameron Roman  has presented today for surgery, with the diagnosis of syncope  The various methods of treatment have been discussed with the patient and family. After consideration of risks, benefits and other options for treatment, the patient has consented to  Procedure(s): Loop Recorder Insertion (N/A) as a surgical intervention .  The patient's history has been reviewed, patient examined, no change in status, stable for surgery.  I have reviewed the patient's chart and labs.  Questions were answered to the patient's satisfaction.     [name]

## 2015-09-14 NOTE — Discharge Instructions (Addendum)
Stroke Prevention Some medical conditions and behaviors are associated with an increased chance of having a stroke. You may prevent a stroke by making healthy choices and managing medical conditions. HOW CAN I REDUCE MY RISK OF HAVING A STROKE?   Stay physically active. Get at least 30 minutes of activity on most or all days.  Do not smoke. It may also be helpful to avoid exposure to secondhand smoke.  Limit alcohol use. Moderate alcohol use is considered to be:  No more than 2 drinks per day for men.  No more than 1 drink per day for nonpregnant women.  Eat healthy foods. This involves:  Eating 5 or more servings of fruits and vegetables a day.  Making dietary changes that address high blood pressure (hypertension), high cholesterol, diabetes, or obesity.  Manage your cholesterol levels.  Making food choices that are high in fiber and low in saturated fat, trans fat, and cholesterol may control cholesterol levels.  Take any prescribed medicines to control cholesterol as directed by your health care provider.  Manage your diabetes.  Controlling your carbohydrate and sugar intake is recommended to manage diabetes.  Take any prescribed medicines to control diabetes as directed by your health care provider.  Control your hypertension.  Making food choices that are low in salt (sodium), saturated fat, trans fat, and cholesterol is recommended to manage hypertension.  Ask your health care provider if you need treatment to lower your blood pressure. Take any prescribed medicines to control hypertension as directed by your health care provider.  If you are 18-39 years of age, have your blood pressure checked every 3-5 years. If you are 40 years of age or older, have your blood pressure checked every year.  Maintain a healthy weight.  Reducing calorie intake and making food choices that are low in sodium, saturated fat, trans fat, and cholesterol are recommended to manage  weight.  Stop drug abuse.  Avoid taking birth control pills.  Talk to your health care provider about the risks of taking birth control pills if you are over 35 years old, smoke, get migraines, or have ever had a blood clot.  Get evaluated for sleep disorders (sleep apnea).  Talk to your health care provider about getting a sleep evaluation if you snore a lot or have excessive sleepiness.  Take medicines only as directed by your health care provider.  For some people, aspirin or blood thinners (anticoagulants) are helpful in reducing the risk of forming abnormal blood clots that can lead to stroke. If you have the irregular heart rhythm of atrial fibrillation, you should be on a blood thinner unless there is a good reason you cannot take them.  Understand all your medicine instructions.  Make sure that other conditions (such as anemia or atherosclerosis) are addressed. SEEK IMMEDIATE MEDICAL CARE IF:   You have sudden weakness or numbness of the face, arm, or leg, especially on one side of the body.  Your face or eyelid droops to one side.  You have sudden confusion.  You have trouble speaking (aphasia) or understanding.  You have sudden trouble seeing in one or both eyes.  You have sudden trouble walking.  You have dizziness.  You have a loss of balance or coordination.  You have a sudden, severe headache with no known cause.  You have new chest pain or an irregular heartbeat. Any of these symptoms may represent a serious problem that is an emergency. Do not wait to see if the symptoms will   go away. Get medical help at once. Call your local emergency services (911 in [place] Do not drive yourself to the hospital.   This information is not intended to replace advice given to you by your health care provider. Make sure you discuss any questions you have with your health care provider.   Document Released: 04/07/2004 Document Revised: 03/21/2014 Document Reviewed:  08/31/2012 Elsevier Interactive Patient Education 2016 [employer]  

## 2015-09-14 NOTE — Consult Note (Signed)
ELECTROPHYSIOLOGY CONSULT NOTE  Patient ID: Cameron Roman MRN: [id number], DOB/AGE: 1950-07-16   Admit date: 09/11/2015 Date of Consult: 09/14/2015  Primary Physician: Kenn File, MD Primary Cardiologist: new to [facility] Reason for Consultation: Cryptogenic stroke; recommendations regarding Implantable Loop Recorder  History of Present Illness Sigurd Pugh was admitted on 09/11/2015 with dizziness and nausea that occurred while picking berries.  He had to lie on the floor and was unable to stand for several minutes.  His symptoms resolved after 15 minutes.  He was seen by his neurologist who recommended ER evaluation.  Imaging demonstrated bilateral infarcts felt to be embolic from unknown source.  He has undergone workup for stroke including echocardiogram and carotid dopplers.  The patient has been monitored on telemetry which has demonstrated sinus rhythm with no arrhythmias.  Inpatient stroke work-up is to be completed with a TEE.   Echocardiogram this admission demonstrated EF 69-67%, grade 1 diastolic dysfunction, mildly dilated aortic root, LA 31.  Lab work is reviewed.  Prior to admission, the patient denies chest pain, shortness of breath, dizziness, palpitations, or syncope.  They are recovering from their stroke with plans to return home at discharge.  EP has been asked to evaluate for placement of an implantable loop recorder to monitor for atrial fibrillation.   Past Medical History  Diagnosis Date  . Hearing loss   . 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 Cough     hx smoking  . Headache(784.0)     headaches r/t cervical issues  . Arthritis   . Chronic back pain     buldging disc  . Chronic neck pain     ruptured disc  . GERD (gastroesophageal reflux disease)     takes Prilosec daily  . H/O hiatal hernia   . History of colon polyps   . Diabetes mellitus     doesn't take anything;diet and exercise controlled  . Insomnia     takes Elavil nightly  .  Diverticulosis   . Esophageal stricture   . Hiatal hernia      Surgical History:  Past Surgical History  Procedure Laterality Date  . Hand surgery Right 2005    right  . Lower back surgery  2009    had plates and screws inserted  . Neck surgery  2009    had insertion of  plates and screws  . Inner ear surgery Bilateral     bil;titanium in both ears  . Appendectomy      at age 21  . Colonoscopy    . Cardiac catheterization  06/03/04  . Anterior cervical decomp/discectomy fusion  04/12/2011    Procedure: ANTERIOR CERVICAL DECOMPRESSION/DISCECTOMY FUSION 2 LEVELS;  Surgeon: Peggyann Shoals, MD;  Location: [place] [facility];  Service: Neurosurgery;  Laterality: N/A;  exploration of Cervical four - seven  fusion with redo Cervical six- seven, cervical three- four anterior cervical decompression with fusion interbody prothesis plating and bonegraft and C34 anterior cervical decompression with inte     Facility-administered medications prior to admission  Medication Dose Route Frequency Provider Last Rate Last Dose  . cyanocobalamin ((VITAMIN B-12)) injection 1,000 mcg  1,000 mcg Intramuscular Daily Timmothy Euler, MD   1,000 mcg at 09/10/15 8938   Prescriptions prior to admission  Medication Sig Dispense Refill Last Dose  . amitriptyline (ELAVIL) 50 MG tablet Take 50 mg by mouth at bedtime.    09/10/2015 at Unknown time  . Cyanocobalamin (B-12) 1000 MCG/ML KIT Inject 1 mL as  directed See admin instructions. Started 09/10/15, 1 injection daily for 7 days them reduce to 1 injection once weekly, then eventually to once monthly   09/10/2015 at Unknown time  . fluticasone (FLONASE) 50 MCG/ACT nasal spray Place 2 sprays into both nostrils daily. 16 g 6 09/11/2015 at Unknown time  . gabapentin (NEURONTIN) 300 MG capsule Take 300 mg by mouth Three times a day.    09/11/2015 at 1 dose  . HYDROcodone-acetaminophen (NORCO) 10-325 MG tablet Take 1 tablet by mouth 3 (three) times daily.   09/11/2015 at 1 dose    . lisinopril (PRINIVIL,ZESTRIL) 5 MG tablet Take 1 tablet (5 mg total) by mouth daily. 90 tablet 1 09/11/2015 at Unknown time  . Omega-3 Fatty Acids (FISH OIL) 1000 MG CAPS Take 2,000 mg by mouth 2 (two) times daily.   09/11/2015 at Unknown time  . omeprazole (PRILOSEC) 20 MG capsule Take 1 capsule (20 mg total) by mouth daily. 30 capsule 11 09/11/2015 at Unknown time  . sildenafil (REVATIO) 20 MG tablet TAKE 2 TO 4 TABLETS AS NEEDED 20 tablet 3 Past Month at Unknown time    Inpatient Medications:  .  stroke: mapping our early stages of recovery book   Does not apply Once  . amitriptyline  50 mg Oral QHS  . aspirin  325 mg Oral Daily  . cyanocobalamin  1,000 mcg Intramuscular Daily  . enoxaparin (LOVENOX) injection  40 mg Subcutaneous QHS  . fluticasone  2 spray Each Nare Daily  . gabapentin  300 mg Oral TID  . HYDROcodone-acetaminophen  1 tablet Oral TID  . insulin aspart  0-9 Units Subcutaneous TID WC  . lisinopril  5 mg Oral Daily  . omega-3 acid ethyl esters  2 g Oral BID  . pantoprazole  40 mg Oral Daily    Allergies:  Allergies  Allergen Reactions  . Percocet [Oxycodone-Acetaminophen] Other (See Comments)    Hallucinations, inability to sleep.    Social History   Social History  . Marital Status: Married    Spouse Name: N/A  . Number of Children: 2  . Years of Education: N/A   Occupational History  . disabled    Social History Main Topics  . Smoking status: Former Smoker -- 1.00 packs/day for 41 years    Types: Cigarettes    Quit date: 06/29/2002  . Smokeless tobacco: Never Used     Comment: quit 58yr ago  . Alcohol Use: 0.0 oz/week    0 Standard drinks or equivalent per week     Comment: occasional less than one a week  . Drug Use: No  . Sexual Activity: Yes   Other Topics Concern  . Not on file   Social History Narrative   Lives with wife in a one story home.  Has 2 children.     Currently does not work.  On disability for wrist pain since early 2000s.    Formerly a tAdministrator     Family History  Problem Relation Age of Onset  . Colon cancer Father   . Anesthesia problems Neg Hx   . Hypotension Neg Hx   . Malignant hyperthermia Neg Hx   . Pseudochol deficiency Neg Hx   . Colon cancer Maternal Grandmother   . Colon cancer Maternal Grandfather   . Heart disease Maternal Uncle   . Stroke Maternal Uncle   . Other Mother     Perforated bowel  . Cancer Brother   . Healthy Son   .  Healthy Daughter       Review of Systems: All other systems reviewed and are otherwise negative except as noted above.  Physical Exam: Filed Vitals:   09/13/15 1744 09/13/15 2130 09/14/15 0144 09/14/15 0539  BP: 148/89 151/92 147/94 151/95  Pulse: 79 83 90 84  Temp: 98.5 F (36.9 C) 98.6 F (37 C) 98 F (36.7 C) 97.5 F (36.4 C)  TempSrc: Oral Oral Oral Oral  Resp: _0 Height:      Weight:      SpO2: 98% 96% 99% 97%    GEN- The patient is well appearing, alert and oriented x 3 today.   Head- normocephalic, atraumatic Eyes-  Sclera clear, conjunctiva pink Ears- hearing intact Oropharynx- clear Neck- supple Lungs- Clear to ausculation bilaterally, normal work of breathing Heart- Regular rate and rhythm, no murmurs, rubs or gallops  GI- soft, NT, ND, + BS Extremities- no clubbing, cyanosis, or edema MS- no significant deformity or atrophy Skin- no rash or lesion Psych- euthymic mood, full affect   Labs:   Lab Results  Component Value Date   WBC 12.3* 09/12/2015   HGB 14.1 09/12/2015   HCT 42.4 09/12/2015   MCV 100.2* 09/12/2015   PLT 328 09/12/2015     Recent Labs Lab 09/12/15 0440  NA 139  K 4.0  CL 107  CO2 26  BUN 12  CREATININE 0.80  CALCIUM 9.0  PROT 6.4*  BILITOT 0.9  ALKPHOS 78  ALT 20  AST 25  GLUCOSE 92     Radiology/Studies: Dg Chest 2 View 09/12/2015  CLINICAL DATA:  65 year old male with stroke. EXAM: CHEST  2 VIEW COMPARISON:  Chest radiograph dated 12/11/2012 FINDINGS: Two views of the  chest demonstrate emphysematous changes of the lungs. There is no focal consolidation, pleural effusion, or pneumothorax. The cardiac silhouette is within normal limits. No acute osseous pathology. IMPRESSION: No active cardiopulmonary disease. Electronically Signed   By: Anner Crete M.D.   On: 09/12/2015 01:51   Mr Brain Wo Contrast 09/11/2015  CLINICAL DATA:  65 year old hypertensive diabetic male with lightheadedness yesterday with visual changes and dizziness. EXAM: MRI HEAD WITHOUT CONTRAST TECHNIQUE: Multiplanar, multiecho pulse sequences of the brain and surrounding structures were obtained without intravenous contrast. COMPARISON:  07/23/2007 brain MR. FINDINGS: Bilateral cerebellar and occipital lobe nonhemorrhagic infarcts. Major intracranial vascular structures are patent. Scattered punctate and patchy white matter changes most consistent with result of small vessel disease. Mild global atrophy without hydrocephalus. No acute orbital abnormality. Prior surgeries C3-4 level. Bulge C2-3 mild spinal stenosis. Cervical medullary junction unremarkable. Opacification right mastoid air cells without obstructing lesion of the eustachian tube noted. Suggestion of prior left mastoid surgery. IMPRESSION: Bilateral cerebellar and occipital lobe nonhemorrhagic infarcts. Mild chronic microvascular changes. Mild global atrophy. C2-3 bulge mild spinal stenosis. Opacification right mastoid air cells. Suggestion prior left mastoid surgery. These results were called by telephone at the time of interpretation on 09/11/2015 at 6:54 pm to Dr. Varney Biles , who verbally acknowledged these results. Electronically Signed   By: Genia Del M.D.   On: 09/11/2015 19:10   12-lead ECG sinus rhythm   Telemetry sinus rhythm  Assessment and Plan:  1. Cryptogenic stroke The patient presents with cryptogenic stroke.  The patient has a TEE planned for this AM.  I spoke at length with the patient about monitoring for afib  with an implantable loop recorder.  Risks, benefits, and alteratives to implantable loop recorder were discussed with the patient  today.   At this time, the patient is very clear in their decision to proceed with implantable loop recorder.   Wound care was reviewed with the patient (keep incision clean and dry for 3 days).  Wound check scheduled for 09/28/15 at 10:30AM   Please call with questions.   Chanetta Marshall, NP 09/14/2015 9:08 AM   EP Attending  Patient seen and examined. Agree with above. On exam he has a RRR, clear lungs and no edema. Abdomen is soft and non-tender. ECG - nsr. Tee - no clot or obvious stroke etiology.  A/P 1. Cryptogenic stroke - I have discussed the treatment options with the patient and he wishes to proceed with ILR.  Mikle Bosworth.D.

## 2015-09-14 NOTE — Progress Notes (Signed)
*  PRELIMINARY RESULTS* EchocardiogramEchocardiogram Transesophageal has been performed.  Cameron Roman 09/14/2015, 10:24 AM

## 2015-09-14 NOTE — Care Management Note (Signed)
Case Management Note  Patient Details  Name: Cameron Roman MRN: [id number] Date of Birth: 1951-03-02  Subjective/Objective:                    Action/Plan: Pt discharging home with self care. No further needs per CM.   Expected Discharge Date:                  Expected Discharge Plan:  Home/Self Care  In-House Referral:     Discharge planning Services     Post Acute Care Choice:    Choice offered to:     DME Arranged:    DME Agency:     HH Arranged:    [place] Agency:     Status of Service:  Completed, signed off  If discussed at [employer] of Stay Meetings, dates discussed:    Additional Comments:  Pollie Friar, RN 09/14/2015, 3:10 PM

## 2015-09-14 NOTE — Interval H&P Note (Signed)
History and Physical Interval Note:  09/14/2015 9:49 AM  Cameron Roman  has presented today for surgery, with the diagnosis of stroke, Loop  The various methods of treatment have been discussed with the patient and family. After consideration of risks, benefits and other options for treatment, the patient has consented to  Procedure(s): TRANSESOPHAGEAL ECHOCARDIOGRAM (TEE) (N/A) as a surgical intervention .  The patient's history has been reviewed, patient examined, no change in status, stable for surgery.  I have reviewed the patient's chart and labs.  Questions were answered to the patient's satisfaction.     Mertie Moores

## 2015-09-14 NOTE — Progress Notes (Signed)
STROKE TEAM PROGRESS NOTE   SUBJECTIVE (INTERVAL HISTORY) Patient's family present. Just back from TEE and loop recorder placement. No complaints. Hoping for discharge today.    OBJECTIVE Temp:  [97.5 F (36.4 C)-98.8 F (37.1 C)] 97.5 F (36.4 C) (07/03 0539) Pulse Rate:  [79-102] 90 (07/03 1050) Cardiac Rhythm:  [-] Normal sinus rhythm (07/03 0700) Resp:  [10-20] 13 (07/03 1050) BP: (139-182)/(89-115) 148/98 mmHg (07/03 1050) SpO2:  [95 %-100 %] 95 % (07/03 1050) Weight:  [72.122 kg (159 lb)] 72.122 kg (159 lb) (07/03 0916)  CBC:   Recent Labs Lab 09/11/15 1528 09/12/15 0440  WBC 12.9* 12.3*  NEUTROABS 6.1  --   HGB 14.3 14.1  HCT 43.3 42.4  MCV 98.6 100.2*  PLT 360 211    Basic Metabolic Panel:   Recent Labs Lab 09/12/15 0440 09/14/15 1138  NA 139 136  K 4.0 4.2  CL 107 102  CO2 26 26  GLUCOSE 92 109*  BUN 12 14  CREATININE 0.80 0.81  CALCIUM 9.0 9.3    Lipid Panel:     Component Value Date/Time   CHOL 140 09/12/2015 0440   CHOL 156 04/03/2015 0942   CHOL 127 08/30/2012 0955   TRIG 168* 09/12/2015 0440   TRIG 301* 04/16/2013 0917   TRIG 166* 08/30/2012 0955   HDL 34* 09/12/2015 0440   HDL 39* 04/03/2015 0942   HDL 38* 04/16/2013 0917   HDL 39* 08/30/2012 0955   CHOLHDL 4.1 09/12/2015 0440   CHOLHDL 4.0 04/03/2015 0942   VLDL 34 09/12/2015 0440   LDLCALC 72 09/12/2015 0440   LDLCALC 81 04/03/2015 0942   LDLCALC 58 04/16/2013 0917   LDLCALC 55 08/30/2012 0955   HgbA1c:  Lab Results  Component Value Date   HGBA1C 5.6 09/12/2015   Urine Drug Screen: No results found for: LABOPIA, COCAINSCRNUR, LABBENZ, AMPHETMU, THCU, LABBARB    IMAGING I have personally reviewed the radiological images below and agree with the radiology interpretations.  Dg Chest 2 View 09/12/2015   No active cardiopulmonary disease.   Mr Brain Wo Contrast 09/11/2015   Bilateral cerebellar and occipital lobe nonhemorrhagic infarcts. Mild chronic microvascular changes.  Mild global atrophy. C2-3 bulge mild spinal stenosis. Opacification right mastoid air cells. Suggestion prior left mastoid surgery.   Mr Jodene Nam Head/brain Wo Cm 09/11/2015   Normal intracranial MRA.   Carotid Doppler   There is 1-39% bilateral ICA stenosis. Vertebral artery flow is antegrade.    2D Echocardiogram  - Left ventricle: The cavity size was normal. Wall thickness was normal. Systolic function was normal. The estimated ejection fraction was in the range of 55% to 60%. Wall motion was normal; there were no regional wall motion abnormalities. Doppler parameters are consistent with abnormal left ventricular relaxation (grade 1 diastolic dysfunction). - Aortic root: The aortic root was mildly dilated. - Atrial septum: There was increased thickness of the septum, consistent with lipomatous hypertrophy. Impressions:   Normal LV systolic function; grade 1 diastolic dysfunction; mildly dilated aortic root.  TEE 09/14/2015 Left Ventrical: Normal LV function Mitral Valve: normal , trace MR  Aortic Valve: normal  Aorta: Mild dilatation of the aortic root Tricuspid Valve: poorly visualized, No significant TR  Pulmonic Valve: poorly visualized  Left Atrium/ Left atrial appendage: no thrombi  Atrial septum: no ASD pr PFO by color doppler or bubble study  Aorta: Mild calcification    PHYSICAL EXAM Pleasant middle aged Caucasian male not in distress. . Afebrile. Head is nontraumatic. Neck is  supple without bruit.    Cardiac exam no murmur or gallop. Lungs are clear to auscultation. Distal pulses are well felt. Neurological Exam ;  Awake  Alert oriented x 3. Normal speech and language.eye movements full without nystagmus.fundi were not visualized. Vision acuity and fields appear normal. Hearing is normal. Palatal movements are normal. Face symmetric. Tongue midline. Normal strength, tone, reflexes and coordination. Normal sensation. Gait deferred.   ASSESSMENT/PLAN [name] is a  65 y.o. male with history of hearing loss, diabetes mellitus, and chronic back pain presenting with near syncope and transient left upper extremity weakness. He did not receive IV t-PA due to the presentation.  Strokes:  Bilateral posterior infarcts felt to be embolic from an unknown source.  Resultant - resolution of deficits  MRI - Bilateral cerebellar and occipital lobe nonhemorrhagic infarcts.  MRA - normal  Carotid Doppler - No significant extracranial stenosis  2D Echo - EF 55% to 60%. No source of embolus  TEE no SOE  Loop recorder placed 09/14/2015 without difficutly  LDL - 72  HgbA1c 5.6  VTE prophylaxis - Lovenox Diet Heart Room service appropriate?: Yes; Fluid consistency:: Thin  No antithrombotic prior to admission, now on aspirin 325 mg daily. Continue ASA on discharge.  Patient counseled to be compliant with his antithrombotic medications  Ongoing aggressive stroke risk factor management  Therapy recommendations: No follow-up PT or OT recommended.  Disposition: return home  followup Dr. Leonie Roman in 2 months. Order written  Hypertension  Stable Permissive hypertension (OK if < 220/120) but gradually normalize in 5-7 days Long-term BP goal normotensive  Hyperlipidemia  Home meds:  Omega-3 fatty acids resumed in hospital  LDL 72, goal < 70  lipitor 10 mg added  Continue statin at discharge  Other Stroke Risk Factors  Advanced age  Cigarette smoker - quit smoking 13 years ago  ETOH use, advised to drink no more than 1 to 2 drink(s) a day  Family hx stroke (maternal uncle)  Other Active Problems  Mild leukocytosis - afebrile  Hospital day # 2  Neurology will sign off. Please call with questions. Pt will follow up with Dr. Leonie Roman at [facility] in about 2 months. Thanks for the consult.  Cameron Hawking, MD PhD Stroke Neurology 09/15/2015 12:10 AM    To contact Stroke Continuity provider, please refer to [email or web address] After hours, contact General  Neurology

## 2015-09-16 ENCOUNTER — Encounter (HOSPITAL_COMMUNITY): Payer: Self-pay | Admitting: Cardiovascular Disease

## 2015-09-16 ENCOUNTER — Ambulatory Visit (INDEPENDENT_AMBULATORY_CARE_PROVIDER_SITE_OTHER): Payer: Medicare Other

## 2015-09-16 DIAGNOSIS — E538 Deficiency of other specified B group vitamins: Secondary | ICD-10-CM | POA: Diagnosis not present

## 2015-09-16 MED ORDER — CYANOCOBALAMIN 1000 MCG/ML IJ SOLN
1000.0000 ug | INTRAMUSCULAR | Status: DC
  Administered 2015-09-30 – 2018-05-01 (×22): 1000 ug via INTRAMUSCULAR

## 2015-09-16 NOTE — Patient Instructions (Signed)

## 2015-09-16 NOTE — Progress Notes (Signed)
B-12 injection given to right deltoid, patient tolerated well. 

## 2015-09-18 ENCOUNTER — Ambulatory Visit (INDEPENDENT_AMBULATORY_CARE_PROVIDER_SITE_OTHER): Payer: Medicare Other | Admitting: Family Medicine

## 2015-09-18 ENCOUNTER — Encounter: Payer: Self-pay | Admitting: Family Medicine

## 2015-09-18 VITALS — BP 149/96 | HR 89 | Temp 97.5°F | Ht 70.0 in | Wt 158.6 lb

## 2015-09-18 DIAGNOSIS — I69398 Other sequelae of cerebral infarction: Secondary | ICD-10-CM | POA: Diagnosis not present

## 2015-09-18 DIAGNOSIS — E785 Hyperlipidemia, unspecified: Secondary | ICD-10-CM

## 2015-09-18 DIAGNOSIS — I639 Cerebral infarction, unspecified: Secondary | ICD-10-CM

## 2015-09-18 DIAGNOSIS — I10 Essential (primary) hypertension: Secondary | ICD-10-CM

## 2015-09-18 DIAGNOSIS — I6340 Cerebral infarction due to embolism of unspecified cerebral artery: Secondary | ICD-10-CM

## 2015-09-18 MED ORDER — LISINOPRIL 10 MG PO TABS: 10.0000 mg | ORAL_TABLET | Freq: Every day | ORAL | Status: DC

## 2015-09-18 NOTE — Progress Notes (Signed)
   HPI  Patient presents today for hospital follow-up.  Patient was admitted to the hospital with acute stroke on July 2.  MRI found evidence of 2 areas concerning for embolic stroke. There was no source of embolism found so he had a loop recorder implanted.  Leaving the hospital his eyesight has returned to normal, he does not note any new neurologic deficits.  He had numbness and tingling on the hands and feet and decreasing high side which caused concern for stroke.  He is taking all medications as prescribed.  He denies any shortness of breath, chest pain, difficulty tolerating food or fluids, or bowel or bladder dysfunction.  PMH: Smoking status noted ROS: Per HPI  Objective: BP 149/96 mmHg  Pulse 89  Temp(Src) 97.5 F (36.4 C) (Oral)  Ht '5\' 10"'$  (1.778 m)  Wt 158 lb 9.6 oz (71.94 kg)  BMI 22.76 kg/m2 Gen: NAD, alert, cooperative with exam HEENT: NCAT, EOMI, PERRL CV: RRR, good S1/S2, no murmur Resp: CTABL, no wheezes, non-labored Ext: No edema, warm Neuro: Alert and oriented, strength 5/5 and sensation intact in all 4 extremities, granular 2 through 12 intact  Assessment and plan:  # Acute embolic stroke Full workup while admitted, no additional symptoms Currently has loop recorder in place.  Discussed signs an dsymps of  astroke and reasons to seek emergency medical care.  Optimize HTN Tx  # HTN Uncontrolled, many readings 140+ in hosp Increase lisinopril to 10 mg  # HLD Pt placed on fish oil while admitted, continue.  Continue lipitor LDL 72, Trigs 168, TC 140    Meds ordered this encounter  Medications  . lisinopril (PRINIVIL,ZESTRIL) 10 MG tablet    Sig: Take 1 tablet (10 mg total) by mouth daily.    Dispense:  30 tablet    Refill:  [place], MD [facility] 09/18/2015, 12:22 PM

## 2015-09-18 NOTE — Patient Instructions (Signed)
Great to see you!  I have increased your lisinopril back to 10 mg  Lets follow up in 2-3 months  Come back anytime if you need [place] sooner.

## 2015-09-23 ENCOUNTER — Ambulatory Visit: Payer: Medicare Other | Admitting: *Deleted

## 2015-09-28 ENCOUNTER — Encounter: Payer: Self-pay | Admitting: Internal Medicine

## 2015-09-28 ENCOUNTER — Telehealth: Payer: Self-pay | Admitting: Internal Medicine

## 2015-09-28 ENCOUNTER — Ambulatory Visit (INDEPENDENT_AMBULATORY_CARE_PROVIDER_SITE_OTHER): Payer: Medicare Other | Admitting: *Deleted

## 2015-09-28 DIAGNOSIS — I639 Cerebral infarction, unspecified: Secondary | ICD-10-CM

## 2015-09-28 LAB — CUP PACEART INCLINIC DEVICE CHECK: Date Time Interrogation Session: 20170717145535

## 2015-09-28 NOTE — Telephone Encounter (Signed)
Cameron Roman called [employer] for home monitoring equipment as we instructed him to earlier today. He says that they referred him back to our clinic. Our clinic does not distribute equipment. I have instructed Cameron Roman to call [employer] back and ask them for equipment in order to establish connectivity with his monitor since he does not have cellular service at his home (either landline or internet adapter). I have told him to call back if he has more trouble and I will get our local [employer] rep involved. He is agreeable.

## 2015-09-28 NOTE — Progress Notes (Addendum)
Wound check in clinic s/p ILR implant. Incision edges approximated. Wound without redness or edema. Wound well healed. Normal ILR function.  Battery status: Good. R-waves 0.61m. 0 symptom episodes, 1 tachy episode x 339ms17sec @ 171bpm, 0 pause episodes, 0 brady episodes. 0 AF episodes (0% burden). Monthly summary reports and ROV with GT prn.

## 2015-09-28 NOTE — Telephone Encounter (Signed)
New Message  Pt calling to speak w/ Device- was told to send remote transmission. Please call back and discuss.

## 2015-09-29 ENCOUNTER — Telehealth: Payer: Self-pay | Admitting: *Deleted

## 2015-09-29 NOTE — Telephone Encounter (Signed)
Patient says he does not have Internet service and cannot get a signal on machine.  Please call.

## 2015-09-29 NOTE — Telephone Encounter (Signed)
Attempted to reach patient, phone rang continuously, no VM/answering machine came on.  Per phone note from 09/28/15, patient was encouraged to contact [employer] tech services regarding assistance in connecting his home monitor.  If patient cannot get monitor connected at home due to signal, will encourage him to send weekly manual transmissions from a location with cellular reception.

## 2015-09-29 NOTE — Telephone Encounter (Signed)
[facility] requesting call back.  Gave device clinic phone number.  Will request that patient send a manual [employer] transmission for review when he returns call.

## 2015-09-30 ENCOUNTER — Ambulatory Visit (INDEPENDENT_AMBULATORY_CARE_PROVIDER_SITE_OTHER): Payer: Medicare Other

## 2015-09-30 DIAGNOSIS — E538 Deficiency of other specified B group vitamins: Secondary | ICD-10-CM | POA: Diagnosis not present

## 2015-09-30 NOTE — Progress Notes (Signed)
B-12 injection given IM to right deltoid, patient tolerated well.

## 2015-10-02 NOTE — Telephone Encounter (Signed)
Able to reach patient.  He moved the monitor around his house a few days ago and was able to find a signal.  Attempted to assist patient in sending manual transmission, but signal was lost prior to attempt.  Advised patient to allow the monitor to search for signal and I will call him back.  Patient is agreeable to this plan and denies questions at this time.

## 2015-10-02 NOTE — Telephone Encounter (Signed)
Spoke with Mrs. Taillon.  She reports that the patient had to run an errand and will be back shortly.  His monitor is still not connecting to the tower per his wife.  Will call back later to speak with patient.

## 2015-10-02 NOTE — Telephone Encounter (Signed)
Spoke with patient.  Advised patient to send weekly transmissions while in town.  Patient asked about land line adapter, but states he was "bounced around" when he called [employer] tech services.  Advised patient that I will call to order an adapter for him and he is agreeable to this plan.  Adapter ordered by [employer] tech services rep, should arrive in 7-10 days.  Patient made aware.  Patient to send manual transmission over the weekend from his daughter's house for review in interim.  He is appreciative of assistance and denies additional questions or concerns.

## 2015-10-04 ENCOUNTER — Encounter: Payer: Self-pay | Admitting: Internal Medicine

## 2015-10-06 NOTE — Telephone Encounter (Signed)
Manual transmission received on 10/04/15.  Tachy episode from 09/15/15 printed and placed in Dr. Macon Large folder for review.

## 2015-10-07 ENCOUNTER — Ambulatory Visit (INDEPENDENT_AMBULATORY_CARE_PROVIDER_SITE_OTHER): Payer: Medicare Other | Admitting: *Deleted

## 2015-10-07 DIAGNOSIS — E538 Deficiency of other specified B group vitamins: Secondary | ICD-10-CM

## 2015-10-07 NOTE — Progress Notes (Signed)
Pt given B12 injections IM left deltoid and tolerated well.

## 2015-10-14 ENCOUNTER — Ambulatory Visit (INDEPENDENT_AMBULATORY_CARE_PROVIDER_SITE_OTHER): Payer: Medicare Other | Admitting: *Deleted

## 2015-10-14 DIAGNOSIS — I639 Cerebral infarction, unspecified: Secondary | ICD-10-CM | POA: Diagnosis not present

## 2015-10-14 NOTE — Progress Notes (Signed)
Carelink Summary Report / Loop Recorder 

## 2015-10-19 ENCOUNTER — Encounter: Payer: Self-pay | Admitting: Family Medicine

## 2015-10-19 ENCOUNTER — Ambulatory Visit (INDEPENDENT_AMBULATORY_CARE_PROVIDER_SITE_OTHER): Payer: Medicare Other | Admitting: Family Medicine

## 2015-10-19 ENCOUNTER — Ambulatory Visit (INDEPENDENT_AMBULATORY_CARE_PROVIDER_SITE_OTHER): Payer: Medicare Other

## 2015-10-19 VITALS — BP 137/86 | HR 66 | Temp 97.3°F | Ht 70.0 in

## 2015-10-19 DIAGNOSIS — S63616A Unspecified sprain of right little finger, initial encounter: Secondary | ICD-10-CM

## 2015-10-19 DIAGNOSIS — M79644 Pain in right finger(s): Secondary | ICD-10-CM | POA: Diagnosis not present

## 2015-10-19 DIAGNOSIS — S63619A Unspecified sprain of unspecified finger, initial encounter: Secondary | ICD-10-CM

## 2015-10-19 NOTE — Progress Notes (Signed)
   HPI  Patient presents today here with right fourth finger pain.  Patient's wife weeks ago he fell off a ladder while picking apples out of a tree. He fell on his buttock and landed with on an outstretched right hand. Since that time he's had fourth digit tenderness at the PIP.  He denies any swelling, bleeding, difficulty moving the joint, or other concerns.  He does have a history of right-sided wrist surgery. He describes the pain as deep achy pain that is persistent for 2 weeks.  PMH: Smoking status noted ROS: Per HPI  Objective: BP 137/86 (BP Location: Left Arm, Patient Position: Sitting, Cuff Size: Normal)   Pulse 66   Temp 97.3 F (36.3 C) (Oral)   Ht '5\' 10"'$  (1.778 m)  Gen: NAD, alert, cooperative with exam HEENT: NCAT CV: RRR, good S1/S2, no murmur Resp: CTABL, no wheezes, non-labored Ext: No edema, warm Neuro: Alert and oriented, No gross deficits  MSK:  R 4 th finger with mild tenderness to palpation of the PIP, wound or lesion. No significant swelling. Neurovascularly intact. Normal range of motion  X-ray No clear fracture Several areas concerning for old foreign bodies, he has surgical clips in his right wrist.  Assessment and plan:  # Sprain of the right fourth PIP, finger sprain Recommended splinting for comfort, discussed over-the-counter medications and offered Voltaren gel which he declines. Return to clinic as needed,    Orders Placed This Encounter  Procedures  . DG Hand Complete Right    Standing Status:   Future    Number of Occurrences:   1    Standing Expiration Date:   12/18/2016    Order Specific Question:   Reason for Exam (SYMPTOM  OR DIAGNOSIS REQUIRED)    Answer:   finger pain    Order Specific Question:   Preferred imaging location?    Answer:   Internal    No orders of the defined types were placed in this encounter.   Laroy Apple, MD [facility] 10/19/2015, 1:05 PM

## 2015-10-20 ENCOUNTER — Other Ambulatory Visit: Payer: Self-pay | Admitting: *Deleted

## 2015-10-20 MED ORDER — ATORVASTATIN CALCIUM 10 MG PO TABS: 10.0000 mg | ORAL_TABLET | Freq: Every day | ORAL | 2 refills | Status: DC

## 2015-10-21 ENCOUNTER — Telehealth: Payer: Self-pay | Admitting: Cardiology

## 2015-10-21 NOTE — Telephone Encounter (Signed)
Spoke w/ pt and requested that he send a manual transmission b/c his home monitor has not updated in at least 14 days.   

## 2015-10-26 ENCOUNTER — Encounter: Payer: Self-pay | Admitting: Neurology

## 2015-10-26 ENCOUNTER — Ambulatory Visit (INDEPENDENT_AMBULATORY_CARE_PROVIDER_SITE_OTHER): Payer: Medicare Other | Admitting: Neurology

## 2015-10-26 VITALS — BP 120/70 | HR 68 | Ht 70.0 in | Wt 162.5 lb

## 2015-10-26 DIAGNOSIS — I639 Cerebral infarction, unspecified: Secondary | ICD-10-CM | POA: Diagnosis not present

## 2015-10-26 NOTE — Patient Instructions (Signed)
Continue your medications as you are taking them  You will always be at an increased risk of stroke.  Stroke is a medical emergency.  Know these warning signs of stroke. Every second counts:  Sudden numbness or weakness of the face, arm or leg, especially on one side of the body,  Sudden confusion, trouble speaking or understanding,  Sudden trouble seeing in one eye or both eyes,  Sudden trouble walking, dizziness, loss of balance or coordination,  Sudden severe headache with no known cause.  If you or someone with you has one or more of these signs, don't delay!  Immediately call 9-1-1, or the emergency medical services (EMS) number so an ambulance (ideally with advanced life support) can be sent for you.  Also, check the time so that you will know when the symptoms first appeared. It's very important to take immediate action. Medical treatment may be available if action is taken early enough.

## 2015-10-26 NOTE — Progress Notes (Signed)
Follow-up Visit   Date: 10/26/15  Cameron Roman MRN: [id number] DOB: January 28, 1951   Interim History: Cameron Roman is a 65 y.o. right-handed Caucasian male with hypertension, hyperlipidemia, GERD, prediabetes, and prior tobacco use (quit 2004) returning to the clinic for follow-up of ischemic stroke.  The patient was accompanied to the clinic by wife who also provides collateral information.    History of present illness: Starting late 2016, he began having left sided pressure and throbbing over the base of the neck with occasionally radiation into his scalp.  Pain can occur when at rest or with activity.  It usually occurs daily, lasting up to an hour.  He has tried motrin which helps and has been taking this daily for the past several months.  No phonophobia, photophobia, or nausea or vomiting.  He denies associated numbness/tinging.  He does have chronic neck pain.    Around early 2017, he began having spells of vision loss involving his left eye, lasting 35-45 seconds which has occurred about 4-5 times. He describes it as if a curtain comes over his vision.  He was very worried and scared when it first occurred.  He did not seek emergent medical care.   He denies any eye pain, temporal pain, blurry vision, numbness/tingling, or weakness.  He has not had his eye checked lately.  He does not take daily aspirin.  There is no family history of stroke.  UPDATE 10/26/2015:  He reports resolution of headaches soon after his left occipital block.  On June 30th, he developed nausea and dizziness followed by weakness of his left arm.  He presented to [facility] ER where he was admitted for stroke work-up after MRI showed acute bilateral occipital and cerebellar strokes without evidence of a source.  He has an implantable loop recorder placed. He has been complaint with aspirin 368m daily.  He denies any new neurological symptoms and overall feels well.  He denies any fevers, chills, or weight loss.  He  keeps up with his cancer screening and had a colonoscopy 2 years ago.      Medications:  Current Outpatient Prescriptions on File Prior to Visit  Medication Sig Dispense Refill  . amitriptyline (ELAVIL) 50 MG tablet Take 50 mg by mouth at bedtime.     .Marland Kitchenaspirin EC 325 MG tablet Take 1 tablet (325 mg total) by mouth daily. 30 tablet 0  . atorvastatin (LIPITOR) 10 MG tablet Take 1 tablet (10 mg total) by mouth daily at 6 PM. 30 tablet 2  . Cyanocobalamin (B-12) 1000 MCG/ML KIT Inject 1 mL as directed See admin instructions. Started 09/10/15, 1 injection daily for 7 days them reduce to 1 injection once weekly, then eventually to once monthly    . fluticasone (FLONASE) 50 MCG/ACT nasal spray Place 2 sprays into both nostrils daily. 16 g 6  . gabapentin (NEURONTIN) 300 MG capsule Take 300 mg by mouth Three times a day.     .Marland KitchenHYDROcodone-acetaminophen (NORCO) 10-325 MG tablet Take 1 tablet by mouth 3 (three) times daily.    .Marland Kitchenlisinopril (PRINIVIL,ZESTRIL) 10 MG tablet Take 1 tablet (10 mg total) by mouth daily. 30 tablet 3  . Omega-3 Fatty Acids (FISH OIL) 1000 MG CAPS Take 2,000 mg by mouth 2 (two) times daily.    .Marland Kitchenomeprazole (PRILOSEC) 20 MG capsule Take 1 capsule (20 mg total) by mouth daily. 30 capsule 11  . sildenafil (REVATIO) 20 MG tablet TAKE 2 TO 4 TABLETS AS NEEDED 20  tablet 3   Current Facility-Administered Medications on File Prior to Visit  Medication Dose Route Frequency Provider Last Rate Last Dose  . cyanocobalamin ((VITAMIN B-12)) injection 1,000 mcg  1,000 mcg Intramuscular Q30 days Timmothy Euler, MD   1,000 mcg at 10/07/15 1608    Allergies:  Allergies  Allergen Reactions  . Percocet [Oxycodone-Acetaminophen] Other (See Comments)    Hallucinations, inability to sleep.    Review of Systems:  CONSTITUTIONAL: No fevers, chills, night sweats, or weight loss.  EYES: No visual changes or eye pain ENT: No hearing changes.  No history of nose bleeds.   RESPIRATORY: No cough,  wheezing and shortness of breath.   CARDIOVASCULAR: Negative for chest pain, and palpitations.   GI: Negative for abdominal discomfort, blood in stools or black stools.  No recent change in bowel habits.   GU:  No history of incontinence.   MUSCLOSKELETAL: No history of joint pain or swelling.  No myalgias.   SKIN: Negative for lesions, rash, and itching.   ENDOCRINE: Negative for cold or heat intolerance, polydipsia or goiter.   PSYCH:  No depression or anxiety symptoms.   NEURO: As Above.   Vital Signs:  BP 120/70   Pulse 68   Ht 5' 10"  (1.778 m)   Wt 162 lb 8 oz (73.7 kg)   SpO2 94%   BMI 23.32 kg/m    General: Well appearing Neck: no carotid bruit CV: RRR Ext: no edema  Neurological Exam: MENTAL STATUS including orientation to time, place, person, recent and remote memory, attention span and concentration, language, and fund of knowledge is normal.  Speech is not dysarthric.  CRANIAL NERVES: No visual field defects. Pupils equal round and reactive to light.  Normal conjugate, extra-ocular eye movements in all directions of gaze.  No ptosis. Normal facial sensation.  Face is symmetric. Palate elevates symmetrically.  Tongue is midline.  MOTOR:  Motor strength is 5/5 in all extremities.  No atrophy, fasciculations or abnormal movements.  No pronator drift.  Tone is normal.    MSRs:  Reflexes are 2+/4 throughout.  SENSORY:  Intact to vibration throughout.  COORDINATION/GAIT:  Normal finger-to- nose-finger.  Intact rapid alternating movements bilaterally.  Gait narrow based and stable.   Data: - MRI brain 09/11/15: Bilateral cerebellar and occipital lobe nonhemorrhagic infarcts. - MRA brain 09/11/15: Normal intracranial MRA. - 2-D echo: LVEF 55-60 percent. - Carotid Dopplers: 1-39 percent ICA plaquing. Vertebral artery flow is antegrade. - TEE: No source of emboli identified.  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  There is no evidence of extra- or intracranial stenosis.  Multiple vascular territories certainly supports embolic events, however he has remained in NSR and TEE did not show thrombus, atrial appendage, or PFO. He has a loop recorder implanted to look for thrombogenic arrhythmias.  Other vascular risk factors are well controlled (LDL 72, HbA1c 5.6, BP is normotensive, former smoker, normal BMI).  Malignancy screen is up-to-date and he denies any constitutional symptoms.    For secondary stroke prevention, recommend that he continue aspirin 346m daily, liptior 177mdaily, and lisinopril 1029maily.   Stroke warning signs were discussed.  He will be  seeing Dr. Leonie Man in [facility] next month for hospital discharge follow-up and is welcome to continue ongoing stroke management and care under him.  The duration of this appointment visit was 30 minutes of face-to-face time with the patient.  Greater than 50% of this time was spent in counseling, explanation of diagnosis, planning of further management, and coordination of care.   Thank you for allowing me to participate in patient's care.  If I can answer any additional questions, I would be pleased to do so.    Sincerely,    Devanny Palecek K. Posey Pronto, DO

## 2015-10-28 ENCOUNTER — Ambulatory Visit: Payer: Medicare Other | Admitting: Family Medicine

## 2015-11-08 LAB — CUP PACEART REMOTE DEVICE CHECK: Date Time Interrogation Session: 20170802150701

## 2015-11-09 ENCOUNTER — Ambulatory Visit (INDEPENDENT_AMBULATORY_CARE_PROVIDER_SITE_OTHER): Payer: Medicare Other | Admitting: *Deleted

## 2015-11-09 DIAGNOSIS — E538 Deficiency of other specified B group vitamins: Secondary | ICD-10-CM

## 2015-11-09 NOTE — Progress Notes (Signed)
Pt given B12 injection IM right deltoid and tolerated well.

## 2015-11-13 ENCOUNTER — Encounter: Payer: Self-pay | Admitting: Internal Medicine

## 2015-11-13 ENCOUNTER — Ambulatory Visit (INDEPENDENT_AMBULATORY_CARE_PROVIDER_SITE_OTHER): Payer: Medicare Other | Admitting: *Deleted

## 2015-11-13 DIAGNOSIS — I639 Cerebral infarction, unspecified: Secondary | ICD-10-CM

## 2015-11-13 NOTE — Progress Notes (Signed)
Carelink Summary Report / Loop Recorder 

## 2015-11-20 ENCOUNTER — Other Ambulatory Visit: Payer: Self-pay | Admitting: *Deleted

## 2015-11-20 MED ORDER — ATORVASTATIN CALCIUM 10 MG PO TABS: 10.0000 mg | ORAL_TABLET | Freq: Every day | ORAL | 0 refills | Status: DC

## 2015-11-24 ENCOUNTER — Encounter: Payer: Self-pay | Admitting: Neurology

## 2015-11-24 ENCOUNTER — Ambulatory Visit (INDEPENDENT_AMBULATORY_CARE_PROVIDER_SITE_OTHER): Payer: Medicare Other | Admitting: Neurology

## 2015-11-24 VITALS — BP 145/93 | HR 70 | Ht 70.0 in | Wt 167.8 lb

## 2015-11-24 DIAGNOSIS — I639 Cerebral infarction, unspecified: Secondary | ICD-10-CM | POA: Diagnosis not present

## 2015-11-24 NOTE — Patient Instructions (Signed)
I had a long d/w patient and his wife about his recent cryptogenic stroke, risk for recurrent stroke/TIAs, personally independently reviewed imaging studies and stroke evaluation results and answered questions.Continue aspirin 325 mg daily  for secondary stroke prevention and maintain strict control of hypertension with blood pressure goal below 130/90, diabetes with hemoglobin A1c goal below 6.5% and lipids with LDL cholesterol goal below 70 mg/dL. I also advised the patient to eat a healthy diet with plenty of whole grains, cereals, fruits and vegetables, exercise regularly and maintain ideal body weight .the patient was also given information to review about possible participation in the [employer] trial. He will call us and let us know if interested. Followup in the future with me in 6 months or call earlier if necessary  Stroke Prevention Some medical conditions and behaviors are associated with an increased chance of having a stroke. You may prevent a stroke by making healthy choices and managing medical conditions. HOW CAN I REDUCE MY RISK OF HAVING A STROKE?   Stay physically active. Get at least 30 minutes of activity on most or all days.  Do not smoke. It may also be helpful to avoid exposure to secondhand smoke.  Limit alcohol use. Moderate alcohol use is considered to be:  No more than 2 drinks per day for men.  No more than 1 drink per day for nonpregnant women.  Eat healthy foods. This involves:  Eating 5 or more servings of fruits and vegetables a day.  Making dietary changes that address high blood pressure (hypertension), high cholesterol, diabetes, or obesity.  Manage your cholesterol levels.  Making food choices that are high in fiber and low in saturated fat, trans fat, and cholesterol may control cholesterol levels.  Take any prescribed medicines to control cholesterol as directed by your health care provider.  Manage your diabetes.  Controlling your carbohydrate  and sugar intake is recommended to manage diabetes.  Take any prescribed medicines to control diabetes as directed by your health care provider.  Control your hypertension.  Making food choices that are low in salt (sodium), saturated fat, trans fat, and cholesterol is recommended to manage hypertension.  Ask your health care provider if you need treatment to lower your blood pressure. Take any prescribed medicines to control hypertension as directed by your health care provider.  If you are 57-75 years of age, have your blood pressure checked every 3-5 years. If you are 28 years of age or older, have your blood pressure checked every year.  Maintain a healthy weight.  Reducing calorie intake and making food choices that are low in sodium, saturated fat, trans fat, and cholesterol are recommended to manage weight.  Stop drug abuse.  Avoid taking birth control pills.  Talk to your health care provider about the risks of taking birth control pills if you are over 14 years old, smoke, get migraines, or have ever had a blood clot.  Get evaluated for sleep disorders (sleep apnea).  Talk to your health care provider about getting a sleep evaluation if you snore a lot or have excessive sleepiness.  Take medicines only as directed by your health care provider.  For some people, aspirin or blood thinners (anticoagulants) are helpful in reducing the risk of forming abnormal blood clots that can lead to stroke. If you have the irregular heart rhythm of atrial fibrillation, you should be on a blood thinner unless there is a good reason you cannot take them.  Understand all your medicine instructions.  Make sure that other conditions (such as anemia or atherosclerosis) are addressed. SEEK IMMEDIATE MEDICAL CARE IF:   You have sudden weakness or numbness of the face, arm, or leg, especially on one side of the body.  Your face or eyelid droops to one side.  You have sudden confusion.  You  have trouble speaking (aphasia) or understanding.  You have sudden trouble seeing in one or both eyes.  You have sudden trouble walking.  You have dizziness.  You have a loss of balance or coordination.  You have a sudden, severe headache with no known cause.  You have new chest pain or an irregular heartbeat. Any of these symptoms may represent a serious problem that is an emergency. Do not wait to see if the symptoms will go away. Get medical help at once. Call your local emergency services (911 in [place] Do not drive yourself to the hospital.   This information is not intended to replace advice given to you by your health care provider. Make sure you discuss any questions you have with your health care provider.   Document Released: 04/07/2004 Document Revised: 03/21/2014 Document Reviewed: 08/31/2012 Elsevier Interactive Patient Education [employer].

## 2015-11-24 NOTE — Progress Notes (Signed)
[facility] [address] [place]. [place] [address] [phone]       OFFICE FOLLOW-UP NOTE  Cameron Roman Date of Birth:  [birth year] Medical Record Number:  [id number]   HPI: Cameron Roman is a pleasant 65 year old Caucasian male seen today for first office follow-up visit following hospital admission for stroke on 09/11/15. He is accompanied today by his wife. Cameron Roman is an 65 y.o. male history diabetes mellitus and hypertension who is admitted with complaint of near syncopal episode on 09/10/2015. Patient felt lightheaded and dizzy as well as nauseated. He sat on the ground and was unable to get back to his feet initially. His wife described him as being very pale. There were no changes in speech. No facial droop was described. He had no extremity weakness at the time. He gives a history of transient left upper extremity weakness 2 days ago, for which he did not seek medical attention. 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 Patient has not been on antiplatelet therapy. LSN: 09/10/2015 tPA Given: No: Beyond time window for treatment; no deficits. MRI scan of the brain showed small bilateral cerebellar and occipital nonhemorrhagic infarcts. MRA of the brain showed no large vessel stenosis. Carotid ultrasound showed no significant extracranial stenosis. Transthoracic echo showed normal ejection fraction. Transesophageal echo showed no cardiac source of embolism. Patient had loop recorder the inserted on 09/14/15. LDL cholesterol was borderline at 72 mg percent. Hemoglobin A1c was 5.6. Patient was started on aspirin for stroke prevention and advise strict control of hypertension and lipids. He states his done well since discharge. His recovered neurologically to his baseline. His able to resume all activities prior to his stroke. His vision has cleared. Left upper extremity strength is back to normal. Is doing well.  His blood pressure is usually well controlled though it is elevated at 145/93 in office today. He is tolerating Lipitor well without muscle aches or pains. He has no new complaints today. ROS:   14 system review of systems is positive for  ringing in the ears, snoring and all other systems negative  PMH:  Past Medical History:  Diagnosis Date  . Arthritis   . Chronic back pain    buldging disc  . Chronic neck pain    ruptured disc  . Cough    hx smoking  . Diverticulosis   . Esophageal stricture   . GERD (gastroesophageal reflux disease)    takes Prilosec daily  . H/O hiatal hernia   . Hearing loss   . Hiatal hernia   . History of colon polyps   . Insomnia    takes Elavil nightly  . 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 Stroke [facility])     Social History:  Social History   Social History  . Marital status: Married    Spouse name: N/A  . Number of children: 2  . Years of education: N/A   Occupational History  . disabled Disabled   Social History Main Topics  . Smoking status: Former Smoker    Packs/day: 1.00    Years: 41.00    Types: Cigarettes    Quit date: 06/29/2002  . Smokeless tobacco: Never Used     Comment: quit 93yr ago  . Alcohol use 0.6 oz/week    1 Cans of beer per week     Comment: occasional less than one a week  . Drug use: No  . Sexual activity: Yes  Other Topics Concern  . Not on file   Social History Narrative   Lives with wife in a one story home.  Has 2 children.     Currently does not work.  On disability for wrist pain since early 2000s.   Formerly a Administrator.    Medications:   Current Outpatient Prescriptions on File Prior to Visit  Medication Sig Dispense Refill  . amitriptyline (ELAVIL) 50 MG tablet Take 50 mg by mouth at bedtime.     Marland Kitchen aspirin EC 325 MG tablet Take 1 tablet (325 mg total) by mouth daily. 30 tablet 0  . atorvastatin (LIPITOR) 10 MG tablet Take 1 tablet (10 mg total) by mouth daily at 6 PM. 90 tablet 0  .  Cyanocobalamin (B-12) 1000 MCG/ML KIT Inject 1 mL as directed See admin instructions. Started 09/10/15, 1 injection daily for 7 days them reduce to 1 injection once weekly, then eventually to once monthly    . fluticasone (FLONASE) 50 MCG/ACT nasal spray Place 2 sprays into both nostrils daily. 16 g 6  . gabapentin (NEURONTIN) 300 MG capsule Take 300 mg by mouth Three times a day.     Marland Kitchen HYDROcodone-acetaminophen (NORCO) 10-325 MG tablet Take 1 tablet by mouth 3 (three) times daily.    Marland Kitchen lisinopril (PRINIVIL,ZESTRIL) 10 MG tablet Take 1 tablet (10 mg total) by mouth daily. 30 tablet 3  . Omega-3 Fatty Acids (FISH OIL) 1000 MG CAPS Take 2,000 mg by mouth 2 (two) times daily.    Marland Kitchen omeprazole (PRILOSEC) 20 MG capsule Take 1 capsule (20 mg total) by mouth daily. 30 capsule 11   Current Facility-Administered Medications on File Prior to Visit  Medication Dose Route Frequency Provider Last Rate Last Dose  . cyanocobalamin ((VITAMIN B-12)) injection 1,000 mcg  1,000 mcg Intramuscular Q30 days Timmothy Euler, MD   1,000 mcg at 11/09/15 8119    Allergies:   Allergies  Allergen Reactions  . Percocet [Oxycodone-Acetaminophen] Other (See Comments)    Hallucinations, inability to sleep.    Physical Exam General: well developed, well nourished middle-age Caucasian male, seated, in no evident distress Head: head normocephalic and atraumatic.  Neck: supple with no carotid or supraclavicular bruits Cardiovascular: regular rate and rhythm, no murmurs Musculoskeletal: no deformity Skin:  no rash/petichiae Vascular:  Normal pulses all extremities Vitals:   11/24/15 1627  BP: (!) 145/93  Pulse: 70   Neurologic Exam Mental Status: Awake and fully alert. Oriented to place and time. Recent and remote memory intact. Attention span, concentration and fund of knowledge appropriate. Mood and affect appropriate.  Cranial Nerves: Fundoscopic exam reveals sharp disc margins. Pupils equal, briskly reactive to  light. Extraocular movements full without nystagmus. Visual fields full to confrontation. Hearing intact. Facial sensation intact. Face, tongue, palate moves normally and symmetrically.  Motor: Normal bulk and tone. Normal strength in all tested extremity muscles. Sensory.: intact to touch ,pinprick .position and vibratory sensation.  Coordination: Rapid alternating movements normal in all extremities. Finger-to-nose and heel-to-shin performed accurately bilaterally. Gait and Station: Arises from chair without difficulty. Stance is normal. Gait demonstrates normal stride length and balance . Able to heel, toe and tandem walk with slight difficulty.  Reflexes: 1+ and symmetric. Toes downgoing.   NIHSS  0 Modified Rankin  0   ASSESSMENT: 70 year Caucasian male that developed cryptogenic posterior circulation embolic infarcts in June 2017. Vascular risk factors of hypertension hyperlipidemia.    PLAN: I had a long d/w patient and his wife  about his recent cryptogenic stroke, risk for recurrent stroke/TIAs, personally independently reviewed imaging studies and stroke evaluation results and answered questions.Continue aspirin 325 mg daily  for secondary stroke prevention and maintain strict control of hypertension with blood pressure goal below 130/90, diabetes with hemoglobin A1c goal below 6.5% and lipids with LDL cholesterol goal below 70 mg/dL. I also advised the patient to eat a healthy diet with plenty of whole grains, cereals, fruits and vegetables, exercise regularly and maintain ideal body weight .the patient was also given information to review about possible participation in the [employer] trial. He will call us and let us know if interested. Followup in the future with me in 6 months or call earlier if necessary Greater than 50% of time during this 25 minute visit was spent on counseling,explanation of diagnosis, planning of further management, discussion with patient and family and  coordination of care Antony Contras, MD  [facility] [address] [place], [place] [address]  Phone [phone] Fax [phone] Note: This document was prepared with digital dictation and possible smart phrase technology. Any transcriptional errors that result from this process are unintentional

## 2015-12-07 LAB — CUP PACEART REMOTE DEVICE CHECK: Date Time Interrogation Session: 20170901153924

## 2015-12-11 ENCOUNTER — Ambulatory Visit (INDEPENDENT_AMBULATORY_CARE_PROVIDER_SITE_OTHER): Payer: Medicare Other | Admitting: *Deleted

## 2015-12-11 DIAGNOSIS — E538 Deficiency of other specified B group vitamins: Secondary | ICD-10-CM

## 2015-12-11 NOTE — Progress Notes (Signed)
Pt given Vit B12 L deltoid Tolerated well

## 2015-12-14 ENCOUNTER — Ambulatory Visit (INDEPENDENT_AMBULATORY_CARE_PROVIDER_SITE_OTHER): Payer: Medicare Other | Admitting: *Deleted

## 2015-12-14 DIAGNOSIS — I639 Cerebral infarction, unspecified: Secondary | ICD-10-CM | POA: Diagnosis not present

## 2015-12-14 NOTE — Progress Notes (Signed)
Carelink Summary Report / Loop Recorder 

## 2015-12-23 ENCOUNTER — Ambulatory Visit (INDEPENDENT_AMBULATORY_CARE_PROVIDER_SITE_OTHER): Payer: Medicare Other | Admitting: Family Medicine

## 2015-12-23 ENCOUNTER — Encounter: Payer: Self-pay | Admitting: Family Medicine

## 2015-12-23 VITALS — BP 122/83 | HR 76 | Temp 97.0°F | Ht 70.0 in | Wt 164.6 lb

## 2015-12-23 DIAGNOSIS — I10 Essential (primary) hypertension: Secondary | ICD-10-CM

## 2015-12-23 DIAGNOSIS — Z0000 Encounter for general adult medical examination without abnormal findings: Secondary | ICD-10-CM | POA: Insufficient documentation

## 2015-12-23 DIAGNOSIS — E538 Deficiency of other specified B group vitamins: Secondary | ICD-10-CM | POA: Diagnosis not present

## 2015-12-23 DIAGNOSIS — E785 Hyperlipidemia, unspecified: Secondary | ICD-10-CM | POA: Diagnosis not present

## 2015-12-23 DIAGNOSIS — Z23 Encounter for immunization: Secondary | ICD-10-CM | POA: Diagnosis not present

## 2015-12-23 DIAGNOSIS — G4700 Insomnia, unspecified: Secondary | ICD-10-CM | POA: Insufficient documentation

## 2015-12-23 MED ORDER — GABAPENTIN 300 MG PO CAPS: 300.0000 mg | ORAL_CAPSULE | Freq: Three times a day (TID) | ORAL | 11 refills | Status: DC

## 2015-12-23 MED ORDER — AMITRIPTYLINE HCL 50 MG PO TABS: 50.0000 mg | ORAL_TABLET | Freq: Every day | ORAL | 11 refills | Status: DC

## 2015-12-23 NOTE — Patient Instructions (Signed)
Great to see you!  I have sent in your refills, come back in 3-4 months for routine follow up

## 2015-12-23 NOTE — Progress Notes (Signed)
   HPI  Patient presents today here for follow-up of hypertension, insomnia, and neck pain.   Gabapentin works well for neck pain, needs refills. Takes one pill 3 times daily.  Hypertension Averages 120s at home Good medication compliance Denies shortness of breath, chest pain, leg swelling, or palpitations.  Insomnia Helped by Elavil Needs refill.  He would like an influenza shot today as well as his B-12 injection.  He also complains of left-sided buttock pain with radiation down the left leg after a trip to visit where he walked all day.   PMH: Smoking status noted ROS: Per HPI  Objective: BP 122/83   Pulse 76   Temp 97 F (36.1 C) (Oral)   Ht '5\' 10"'$  (1.778 m)   Wt 164 lb 9.6 oz (74.7 kg)   BMI 23.62 kg/m  Gen: NAD, alert, cooperative with exam HEENT: NCAT CV: RRR, good S1/S2, no murmur Resp: CTABL, no wheezes, non-labored Ext: No edema, warm Neuro: Alert and oriented, No gross deficits  Musculoskeletal Pain reproduced with deep palpation of the left buttock over the piriformis muscle  Assessment and plan:  # Hyperlipidemia Stable Repeating labs, history of stroke so LDL goal less than 70  # Hypertension Well-controlled, continue lisinopril  # Insomnia Helped by and amitriptyline, refilled  # Piriformis syndrome Discussed ice and supportive care Handout from sports medicine patient advisor given  Preventative healthcare Influenza vaccine given, counseling provided Hepatitis C antibody  Orders Placed This Encounter  Procedures  . CMP14+EGFR  . CBC with Differential/Platelet  . Lipid panel  . Hepatitis C antibody    Meds ordered this encounter  Medications  . gabapentin (NEURONTIN) 300 MG capsule    Sig: Take 1 capsule (300 mg total) by mouth 3 (three) times daily.    Dispense:  90 capsule    Refill:  11  . amitriptyline (ELAVIL) 50 MG tablet    Sig: Take 1 tablet (50 mg total) by mouth at bedtime.    Dispense:  30 tablet    Refill:   [place], MD [facility] 12/23/2015, 8:43 AM

## 2015-12-24 ENCOUNTER — Encounter: Payer: Self-pay | Admitting: Cardiology

## 2015-12-24 ENCOUNTER — Other Ambulatory Visit: Payer: Self-pay | Admitting: Family Medicine

## 2015-12-24 DIAGNOSIS — R768 Other specified abnormal immunological findings in serum: Secondary | ICD-10-CM

## 2015-12-24 LAB — LIPID PANEL
Chol/HDL Ratio: 2.9 ratio units (ref 0.0–5.0)
Cholesterol, Total: 120 mg/dL (ref 100–199)
HDL: 41 mg/dL (ref >39)
LDL Calculated: 54 mg/dL (ref 0–99)
Triglycerides: 127 mg/dL (ref 0–149)
VLDL CHOLESTEROL CAL: 25 mg/dL (ref 5–40)

## 2015-12-24 LAB — CMP14+EGFR
ALK PHOS: 79 IU/L (ref 39–117)
ALT: 23 IU/L (ref 0–44)
AST: 28 IU/L (ref 0–40)
Albumin/Globulin Ratio: 1.3 (ref 1.2–2.2)
Albumin: 3.8 g/dL (ref 3.6–4.8)
BILIRUBIN TOTAL: 0.2 mg/dL (ref 0.0–1.2)
BUN/Creatinine Ratio: 11 (ref 10–24)
BUN: 10 mg/dL (ref 8–27)
CHLORIDE: 102 mmol/L (ref 96–106)
CO2: 27 mmol/L (ref 18–29)
Calcium: 9.3 mg/dL (ref 8.6–10.2)
Creatinine, Ser: 0.94 mg/dL (ref 0.76–1.27)
GFR calc non Af Amer: 85 mL/min/{1.73_m2} (ref >59)
GFR, EST AFRICAN AMERICAN: 99 mL/min/{1.73_m2} (ref >59)
GLUCOSE: 105 mg/dL — AB (ref 65–99)
Globulin, Total: 2.9 g/dL (ref 1.5–4.5)
Potassium: 4.7 mmol/L (ref 3.5–5.2)
Sodium: 142 mmol/L (ref 134–144)
TOTAL PROTEIN: 6.7 g/dL (ref 6.0–8.5)

## 2015-12-24 LAB — CBC WITH DIFFERENTIAL/PLATELET
BASOS ABS: 0.1 10*3/uL (ref 0.0–0.2)
Basos: 1 %
EOS (ABSOLUTE): 0.5 10*3/uL — ABNORMAL HIGH (ref 0.0–0.4)
Eos: 4 %
HEMOGLOBIN: 14 g/dL (ref 12.6–17.7)
Hematocrit: 40.2 % (ref 37.5–51.0)
IMMATURE GRANS (ABS): 0 10*3/uL (ref 0.0–0.1)
IMMATURE GRANULOCYTES: 0 %
LYMPHS: 47 %
Lymphocytes Absolute: 5.4 10*3/uL — ABNORMAL HIGH (ref 0.7–3.1)
MCH: 33.3 pg — ABNORMAL HIGH (ref 26.6–33.0)
MCHC: 34.8 g/dL (ref 31.5–35.7)
MCV: 96 fL (ref 79–97)
MONOCYTES: 9 %
Monocytes Absolute: 1.1 10*3/uL — ABNORMAL HIGH (ref 0.1–0.9)
NEUTROS PCT: 39 %
Neutrophils Absolute: 4.5 10*3/uL (ref 1.4–7.0)
PLATELETS: 370 10*3/uL (ref 150–379)
RBC: 4.21 x10E6/uL (ref 4.14–5.80)
RDW: 14.7 % (ref 12.3–15.4)
WBC: 11.6 10*3/uL — AB (ref 3.4–10.8)

## 2015-12-24 LAB — HEPATITIS C ANTIBODY: Hep C Virus Ab: >11 s/co ratio — ABNORMAL HIGH (ref 0.0–0.9)

## 2015-12-28 ENCOUNTER — Other Ambulatory Visit: Payer: Self-pay

## 2015-12-28 DIAGNOSIS — R7309 Other abnormal glucose: Secondary | ICD-10-CM

## 2015-12-28 DIAGNOSIS — R768 Other specified abnormal immunological findings in serum: Secondary | ICD-10-CM

## 2015-12-30 ENCOUNTER — Other Ambulatory Visit (INDEPENDENT_AMBULATORY_CARE_PROVIDER_SITE_OTHER): Payer: Medicare Other

## 2015-12-30 DIAGNOSIS — R768 Other specified abnormal immunological findings in serum: Secondary | ICD-10-CM | POA: Diagnosis not present

## 2015-12-30 DIAGNOSIS — R7309 Other abnormal glucose: Secondary | ICD-10-CM | POA: Diagnosis not present

## 2015-12-30 LAB — BAYER DCA HB A1C WAIVED: HB A1C: 5.6 % (ref <7.0)

## 2015-12-31 ENCOUNTER — Telehealth: Payer: Self-pay | Admitting: Family Medicine

## 2015-12-31 ENCOUNTER — Telehealth: Payer: Self-pay | Admitting: Internal Medicine

## 2015-12-31 DIAGNOSIS — B182 Chronic viral hepatitis C: Secondary | ICD-10-CM

## 2015-12-31 LAB — HCV RNA QUANT
HCV LOG10: 6.149 {Log_IU}/mL
HEPATITIS C QUANTITATION: 1410000 [IU]/mL

## 2015-12-31 NOTE — Telephone Encounter (Signed)
Spoke w/ pt wife informed her that pt needed to send a remote transmission w/ his home monitor. Informed her that the only time he needs to do it manually is when he gets a phone call from the office or receives the letter again. Pt wife verbalized understating.

## 2015-12-31 NOTE — Telephone Encounter (Signed)
Will attempt call again, no answer.   Newly Dx chronic Hep C  Laroy Apple, MD [facility] 12/31/2015, 3:06 PM

## 2015-12-31 NOTE — Telephone Encounter (Signed)
New Dx of hepatitis C, called and discussed with pt. Refer to ID.   Cameron Apple, MD [facility] 12/31/2015, 6:37 PM

## 2015-12-31 NOTE — Telephone Encounter (Signed)
New message      Pt received a letter stating we were not getting any device readings.  They do not know what that mean.  Please call

## 2015-12-31 NOTE — Telephone Encounter (Signed)
Called and discussed hep C with he and his wife.   Packet from up to date left up front, she will pick up n the am.   Refer to ID.   Laroy Apple, MD [facility] 12/31/2015, 6:54 PM

## 2016-01-02 LAB — HEPATITIS C ANTIBODY: Hep C Virus Ab: >11 s/co ratio — ABNORMAL HIGH (ref 0.0–0.9)

## 2016-01-07 ENCOUNTER — Other Ambulatory Visit (HOSPITAL_COMMUNITY): Payer: Self-pay | Admitting: Nurse Practitioner

## 2016-01-07 DIAGNOSIS — B182 Chronic viral hepatitis C: Secondary | ICD-10-CM

## 2016-01-12 ENCOUNTER — Ambulatory Visit (INDEPENDENT_AMBULATORY_CARE_PROVIDER_SITE_OTHER): Payer: Medicare Other | Admitting: *Deleted

## 2016-01-12 DIAGNOSIS — I639 Cerebral infarction, unspecified: Secondary | ICD-10-CM

## 2016-01-13 ENCOUNTER — Ambulatory Visit (HOSPITAL_COMMUNITY)
Admission: RE | Admit: 2016-01-13 | Discharge: 2016-01-13 | Disposition: A | Discharge status: Home / Self Care | Payer: Medicare Other | Source: Ambulatory Visit | Attending: Nurse Practitioner | Admitting: Nurse Practitioner

## 2016-01-13 DIAGNOSIS — B182 Chronic viral hepatitis C: Secondary | ICD-10-CM | POA: Insufficient documentation

## 2016-01-13 DIAGNOSIS — K8020 Calculus of gallbladder without cholecystitis without obstruction: Secondary | ICD-10-CM | POA: Diagnosis not present

## 2016-01-13 NOTE — Progress Notes (Signed)
Carelink Summary Report / Loop Recorder 

## 2016-01-18 ENCOUNTER — Encounter: Payer: Self-pay | Admitting: Family Medicine

## 2016-01-18 LAB — HM DIABETES EYE EXAM

## 2016-01-23 LAB — CUP PACEART REMOTE DEVICE CHECK
Date Time Interrogation Session: 20171001174102
Implantable Pulse Generator Implant Date: 20170703

## 2016-01-23 NOTE — Progress Notes (Signed)
Carelink summary report received. Battery status OK. Normal device function. No new symptom episodes, tachy episodes, brady, or pause episodes. No new AF episodes. Monthly summary reports and ROV/PRN 

## 2016-02-08 ENCOUNTER — Other Ambulatory Visit: Payer: Self-pay | Admitting: Family Medicine

## 2016-02-09 ENCOUNTER — Telehealth: Payer: Self-pay | Admitting: Family Medicine

## 2016-02-09 DIAGNOSIS — K740 Hepatic fibrosis: Secondary | ICD-10-CM | POA: Diagnosis not present

## 2016-02-09 MED ORDER — PRAVASTATIN SODIUM 20 MG PO TABS: 20.0000 mg | ORAL_TABLET | Freq: Every day | ORAL | 3 refills | Status: DC

## 2016-02-09 NOTE — Telephone Encounter (Signed)
Call from he[patology, need to stop lipitor to start harvoni.   Ok to chang eto pravastatin. Given stroke 6 nmonths ago will change to pravastatin.   Laroy Apple, MD [facility] 02/09/2016, 12:53 PM

## 2016-02-09 NOTE — Telephone Encounter (Signed)
Pt aware.

## 2016-02-11 ENCOUNTER — Ambulatory Visit (INDEPENDENT_AMBULATORY_CARE_PROVIDER_SITE_OTHER): Payer: Medicare Other | Admitting: Family Medicine

## 2016-02-11 ENCOUNTER — Encounter: Payer: Self-pay | Admitting: Family Medicine

## 2016-02-11 ENCOUNTER — Ambulatory Visit (INDEPENDENT_AMBULATORY_CARE_PROVIDER_SITE_OTHER): Payer: Medicare Other | Admitting: *Deleted

## 2016-02-11 VITALS — BP 138/83 | HR 64 | Temp 96.8°F | Ht 70.0 in | Wt 162.4 lb

## 2016-02-11 DIAGNOSIS — E538 Deficiency of other specified B group vitamins: Secondary | ICD-10-CM | POA: Diagnosis not present

## 2016-02-11 DIAGNOSIS — Z8673 Personal history of transient ischemic attack (TIA), and cerebral infarction without residual deficits: Secondary | ICD-10-CM

## 2016-02-11 DIAGNOSIS — H6692 Otitis media, unspecified, left ear: Secondary | ICD-10-CM

## 2016-02-11 DIAGNOSIS — I639 Cerebral infarction, unspecified: Secondary | ICD-10-CM

## 2016-02-11 MED ORDER — AMOXICILLIN 500 MG PO CAPS: 500.0000 mg | ORAL_CAPSULE | Freq: Three times a day (TID) | ORAL | 0 refills | Status: DC

## 2016-02-11 NOTE — Progress Notes (Signed)
   HPI  Patient presents today here with ear drainage.  Patient's lines over the last one week she's had clear drainage from his left ear. He denies any pain or difficulty hearing compared to usual. He has a long history of ear surgery and difficulties with his left ear, he last had an ear surgery 2 years ago and has a chronic perforation there.  He is due for a B-12 injection.  He's being started on hepatitis C treatment, harvoni, had to stop Lipitor for this. Since he had a stroke over the summer we have started him on pravastatin which he has started taking.  PMH: Smoking status noted ROS: Per HPI  Objective: BP 138/83   Pulse 64   Temp (!) 96.8 F (36 C) (Oral)   Ht '5\' 10"'$  (1.778 m)   Wt 162 lb 6.4 oz (73.7 kg)   BMI 23.30 kg/m  Gen: NAD, alert, cooperative with exam HEENT: NCAT, left TM macerated with small perforation, clear drainage apparent and the ear canal CV: RRR, good S1/S2, no murmur Resp: CTABL, no wheezes, non-labored Ext: No edema, warm Neuro: Alert and oriented, No gross deficits  Assessment and plan:  # Left-sided otitis media, chronic perforation Treating with amoxicillin Recommended very low threshold for follow-up with ENT, also recommended routine follow-up with them anyway as it's been over a year since he has seen them  # History of CVA Starting pravastatin as an alternative to Lipitor which was Dc'd to start [place]   # B12 def- injection today Stable  Meds ordered this encounter  Medications  . amoxicillin (AMOXIL) 500 MG capsule    Sig: Take 1 capsule (500 mg total) by mouth 3 (three) times daily.    Dispense:  30 capsule    Refill:  0    Laroy Apple, MD [facility] 02/11/2016, 9:00 AM

## 2016-02-11 NOTE — Patient Instructions (Signed)
Great to see you!  Start amoxicillin for your ear, you may want to eat yogurt once daily or take a pro-biotic to keep the bacteria in your bowels healthy  Call Dr. Cresenciano Lick if you are not improving as expected ( it may be a good time for a follow up anyway)

## 2016-02-11 NOTE — Progress Notes (Signed)
Carelink Summary Report / Loop Recorder 

## 2016-02-20 LAB — CUP PACEART REMOTE DEVICE CHECK
Date Time Interrogation Session: 20171031174216
MDC IDC PG IMPLANT DT: 20170703

## 2016-02-20 NOTE — Progress Notes (Signed)
Carelink summary report received. Battery status OK. Normal device function. No new symptom episodes, brady, or pause episodes. No new AF episodes. 1 tachy- ECG appears atrial in origin. ?SVT/PAT. Monthly summary reports and ROV/PRN

## 2016-03-15 ENCOUNTER — Ambulatory Visit (INDEPENDENT_AMBULATORY_CARE_PROVIDER_SITE_OTHER): Payer: Medicare Other | Admitting: *Deleted

## 2016-03-15 ENCOUNTER — Encounter: Payer: Self-pay | Admitting: Cardiology

## 2016-03-15 ENCOUNTER — Ambulatory Visit (INDEPENDENT_AMBULATORY_CARE_PROVIDER_SITE_OTHER): Payer: Medicare Other

## 2016-03-15 DIAGNOSIS — E538 Deficiency of other specified B group vitamins: Secondary | ICD-10-CM | POA: Diagnosis not present

## 2016-03-15 DIAGNOSIS — I639 Cerebral infarction, unspecified: Secondary | ICD-10-CM | POA: Diagnosis not present

## 2016-03-15 NOTE — Progress Notes (Signed)
B-12 injection given in right deltoid, patient tolerated well

## 2016-03-15 NOTE — Patient Instructions (Signed)
Cyanocobalamin, Vitamin B12 injection What is this medicine? CYANOCOBALAMIN (sye an oh koe BAL a min) is a man made form of vitamin B12. Vitamin B12 is used in the growth of healthy blood cells, nerve cells, and proteins in the body. It also helps with the metabolism of fats and carbohydrates. This medicine is used to treat people who can not absorb vitamin B12. This medicine may be used for other purposes; ask your health care provider or pharmacist if you have questions. COMMON BRAND NAME(S): B-12 Compliance Kit, B-12 Injection Kit, Cyomin, LA-12, Nutri-Twelve, Physicians EZ Use B-12, Primabalt What should I tell my health care provider before I take this medicine? They need to know if you have any of these conditions: -kidney disease -Leber's disease -megaloblastic anemia -an unusual or allergic reaction to cyanocobalamin, cobalt, other medicines, foods, dyes, or preservatives -pregnant or trying to get pregnant -breast-feeding How should I use this medicine? This medicine is injected into a muscle or deeply under the skin. It is usually given by a health care professional in a clinic or doctor's office. However, your doctor may teach you how to inject yourself. Follow all instructions. Talk to your pediatrician regarding the use of this medicine in children. Special care may be needed. Overdosage: If you think you have taken too much of this medicine contact a poison control center or emergency room at once. NOTE: This medicine is only for you. Do not share this medicine with others. What if I miss a dose? If you are given your dose at a clinic or doctor's office, call to reschedule your appointment. If you give your own injections and you miss a dose, take it as soon as you can. If it is almost time for your next dose, take only that dose. Do not take double or extra doses. What may interact with this medicine? -colchicine -heavy alcohol intake This list may not describe all possible  interactions. Give your health care provider a list of all the medicines, herbs, non-prescription drugs, or dietary supplements you use. Also tell them if you smoke, drink alcohol, or use illegal drugs. Some items may interact with your medicine. What should I watch for while using this medicine? Visit your doctor or health care professional regularly. You may need blood work done while you are taking this medicine. You may need to follow a special diet. Talk to your doctor. Limit your alcohol intake and avoid smoking to get the best benefit. What side effects may I notice from receiving this medicine? Side effects that you should report to your doctor or health care professional as soon as possible: -allergic reactions like skin rash, itching or hives, swelling of the face, lips, or tongue -blue tint to skin -chest tightness, pain -difficulty breathing, wheezing -dizziness -red, swollen painful area on the leg Side effects that usually do not require medical attention (report to your doctor or health care professional if they continue or are bothersome): -diarrhea -headache This list may not describe all possible side effects. Call your doctor for medical advice about side effects. You may report side effects to FDA at 1-800-FDA-1088. Where should I keep my medicine? Keep out of the reach of children. Store at room temperature between 15 and 30 degrees C (59 and 85 degrees F). Protect from light. Throw away any unused medicine after the expiration date. NOTE: This sheet is a summary. It may not cover all possible information. If you have questions about this medicine, talk to your doctor, pharmacist, or   health care provider.  2017 Elsevier/Gold Standard (2007-06-11 22:10:20)  

## 2016-03-16 NOTE — Progress Notes (Signed)
Carelink Summary Report 

## 2016-03-17 DIAGNOSIS — H66002 Acute suppurative otitis media without spontaneous rupture of ear drum, left ear: Secondary | ICD-10-CM | POA: Diagnosis not present

## 2016-03-17 DIAGNOSIS — J069 Acute upper respiratory infection, unspecified: Secondary | ICD-10-CM | POA: Diagnosis not present

## 2016-03-17 DIAGNOSIS — H906 Mixed conductive and sensorineural hearing loss, bilateral: Secondary | ICD-10-CM | POA: Diagnosis not present

## 2016-03-23 LAB — CUP PACEART REMOTE DEVICE CHECK
Date Time Interrogation Session: 20171130183728
MDC IDC PG IMPLANT DT: 20170703

## 2016-03-24 DIAGNOSIS — H906 Mixed conductive and sensorineural hearing loss, bilateral: Secondary | ICD-10-CM | POA: Diagnosis not present

## 2016-03-24 DIAGNOSIS — H6983 Other specified disorders of Eustachian tube, bilateral: Secondary | ICD-10-CM | POA: Diagnosis not present

## 2016-03-24 DIAGNOSIS — H6521 Chronic serous otitis media, right ear: Secondary | ICD-10-CM | POA: Diagnosis not present

## 2016-04-07 ENCOUNTER — Ambulatory Visit (INDEPENDENT_AMBULATORY_CARE_PROVIDER_SITE_OTHER): Payer: Medicare Other | Admitting: Family Medicine

## 2016-04-07 ENCOUNTER — Encounter: Payer: Self-pay | Admitting: Family Medicine

## 2016-04-07 VITALS — BP 134/89 | HR 83 | Temp 97.0°F | Ht 70.0 in | Wt 164.0 lb

## 2016-04-07 DIAGNOSIS — M542 Cervicalgia: Secondary | ICD-10-CM | POA: Diagnosis not present

## 2016-04-07 MED ORDER — METHYLPREDNISOLONE ACETATE 80 MG/ML IJ SUSP
80.0000 mg | Freq: Once | INTRAMUSCULAR | Status: AC
  Administered 2016-04-07: 80 mg via INTRAMUSCULAR

## 2016-04-07 NOTE — Patient Instructions (Signed)
Great to see you!   

## 2016-04-07 NOTE — Progress Notes (Signed)
   HPI  Patient presents today with neck pain.  Patient explains that over the last 3-4 days he's had severe bilateral neck pain with difficulty turning his head. He takes chronic hydrocodone prescribed by his neurosurgeon which usually holds his pain off very well. He states that previously he is responded very well to a shot of IM steroids and request that today.  Denies fever, chills, sweats, or difficulty tolerating foods or fluids. He feels well otherwise but states that the pain is unbearable. His usual dose of hydrocodone is not helping currently.  Patient states this is similar to previous flares of cervical neck pain.  PMH: Smoking status noted ROS: Per HPI  Objective: BP 134/89   Pulse 83   Temp 97 F (36.1 C) (Oral)   Ht '5\' 10"'$  (1.778 m)   Wt 164 lb (74.4 kg)   BMI 23.53 kg/m  Gen: NAD, alert, cooperative with exam HEENT: NCAT, moist oral mucosa CV: RRR, good S1/S2, no murmur Resp: CTABL, no wheezes, non-labored Ext: No edema, warm Neuro: Alert and oriented, No gross deficits MSK: Tenderness to palpation of the paraspinal muscles in the cervical area bilaterally, no midline tenderness, limited range of motion in all directions of the neck  Assessment and plan:  # Neck pain Acute on chronic neck pain, treated with IM Depo-Medrol today Discussed the pros and cons of oral treatment versus IM treatment, patient very clearly wants an injection today. Continue chronic hydrocodone. Very low threshold for follow-up or seeking emergent medical care if develops fever, chills, sweats, or any unusual additional symptoms. Discussed very serious conditions related with neck pain.    Meds ordered this encounter  Medications  . HARVONI 90-400 MG TABS  . methylPREDNISolone acetate (DEPO-MEDROL) injection 80 mg    Laroy Apple, MD [facility] 04/07/2016, 2:58 PM

## 2016-04-11 ENCOUNTER — Ambulatory Visit (INDEPENDENT_AMBULATORY_CARE_PROVIDER_SITE_OTHER): Payer: Medicare Other | Admitting: *Deleted

## 2016-04-11 DIAGNOSIS — I639 Cerebral infarction, unspecified: Secondary | ICD-10-CM

## 2016-04-12 NOTE — Progress Notes (Signed)
Carelink Summary Report / Loop Recorder 

## 2016-04-13 ENCOUNTER — Ambulatory Visit (INDEPENDENT_AMBULATORY_CARE_PROVIDER_SITE_OTHER): Payer: Medicare Other | Admitting: *Deleted

## 2016-04-13 DIAGNOSIS — E538 Deficiency of other specified B group vitamins: Secondary | ICD-10-CM

## 2016-04-13 NOTE — Progress Notes (Signed)
Pt given Vit B12 inj Tolerated well 

## 2016-04-19 ENCOUNTER — Other Ambulatory Visit: Payer: Self-pay | Admitting: Internal Medicine

## 2016-04-25 ENCOUNTER — Ambulatory Visit (INDEPENDENT_AMBULATORY_CARE_PROVIDER_SITE_OTHER): Payer: Medicare Other | Admitting: Family Medicine

## 2016-04-25 ENCOUNTER — Encounter: Payer: Self-pay | Admitting: Family Medicine

## 2016-04-25 VITALS — BP 120/85 | HR 73 | Temp 97.3°F | Ht 70.0 in | Wt 160.2 lb

## 2016-04-25 DIAGNOSIS — G5702 Lesion of sciatic nerve, left lower limb: Secondary | ICD-10-CM | POA: Diagnosis not present

## 2016-04-25 DIAGNOSIS — I10 Essential (primary) hypertension: Secondary | ICD-10-CM | POA: Diagnosis not present

## 2016-04-25 DIAGNOSIS — E785 Hyperlipidemia, unspecified: Secondary | ICD-10-CM

## 2016-04-25 NOTE — Progress Notes (Signed)
   HPI  Patient presents today for four-month follow-up.  Patient also complains of lower back pain.  She explains that he is got left buttock pain causing left-sided radiation down the leg to the heel. He states he's had this before, it stops after 1-2 weeks, he does not want any intervention on my part. He denies any leg weakness, bowel or bladder dysfunction, saddle anesthesia.  He has chronic neck and back pain managed by neurosurgery and treated with chronic narcotics.  He has been treated on harvoni for over a month now and is doing well.  He has no complaints about his blood pressure or cholesterol medications. He is active exercising about 3-4 times per week.   PMH: Smoking status noted ROS: Per HPI  Objective: BP 120/85   Pulse 73   Temp 97.3 F (36.3 C) (Oral)   Ht '5\' 10"'$  (1.778 m)   Wt 160 lb 3.2 oz (72.7 kg)   BMI 22.99 kg/m  Gen: NAD, alert, cooperative with exam HEENT: NCAT CV: RRR, good S1/S2, no murmur Resp: CTABL, no wheezes, non-labored Ext: No edema, warm Neuro: Alert and oriented, No gross deficits MSK:  Walking with a slight limp to the left  Assessment and plan:  # HTN Well-controlled on lisinopril Labs UTD  # Hyperlipidemia Patient changed from a statin to pravastatin when he was placed on harvoni No changes  # Piriformis syndrome Patient declines handout with exercises, discussed supportive care No red flags   Laroy Apple, MD [facility] 04/25/2016, 8:13 AM

## 2016-04-30 LAB — CUP PACEART REMOTE DEVICE CHECK
Date Time Interrogation Session: 20171230191242
MDC IDC PG IMPLANT DT: 20170703

## 2016-04-30 NOTE — Progress Notes (Signed)
Carelink summary report received. Battery status OK. Normal device function. No new symptom episodes, tachy episodes, brady, or pause episodes. No new AF episodes. Monthly summary reports and ROV/PRN 

## 2016-05-09 DIAGNOSIS — H906 Mixed conductive and sensorineural hearing loss, bilateral: Secondary | ICD-10-CM | POA: Diagnosis not present

## 2016-05-09 DIAGNOSIS — H7202 Central perforation of tympanic membrane, left ear: Secondary | ICD-10-CM | POA: Diagnosis not present

## 2016-05-09 DIAGNOSIS — H6983 Other specified disorders of Eustachian tube, bilateral: Secondary | ICD-10-CM | POA: Diagnosis not present

## 2016-05-09 LAB — CUP PACEART REMOTE DEVICE CHECK
Implantable Pulse Generator Implant Date: 20170703
MDC IDC SESS DTM: 20180129223014

## 2016-05-10 DIAGNOSIS — H6983 Other specified disorders of Eustachian tube, bilateral: Secondary | ICD-10-CM | POA: Diagnosis not present

## 2016-05-10 DIAGNOSIS — H906 Mixed conductive and sensorineural hearing loss, bilateral: Secondary | ICD-10-CM | POA: Diagnosis not present

## 2016-05-10 DIAGNOSIS — H7202 Central perforation of tympanic membrane, left ear: Secondary | ICD-10-CM | POA: Diagnosis not present

## 2016-05-11 ENCOUNTER — Telehealth: Payer: Self-pay | Admitting: *Deleted

## 2016-05-11 ENCOUNTER — Ambulatory Visit (INDEPENDENT_AMBULATORY_CARE_PROVIDER_SITE_OTHER): Payer: Medicare Other | Admitting: *Deleted

## 2016-05-11 DIAGNOSIS — I639 Cerebral infarction, unspecified: Secondary | ICD-10-CM | POA: Diagnosis not present

## 2016-05-11 NOTE — Telephone Encounter (Signed)
Spoke w/ pt and informed him that his remote transmission is automatic, monitor is updated, and he doesn't have to do anything. Pt verbalized understanding.

## 2016-05-11 NOTE — Telephone Encounter (Signed)
New Message    Pt states his remote transmission is not working so he cannot send it in. He states he may need assistance on how to work it.

## 2016-05-12 ENCOUNTER — Ambulatory Visit (INDEPENDENT_AMBULATORY_CARE_PROVIDER_SITE_OTHER): Payer: Medicare Other | Admitting: *Deleted

## 2016-05-12 DIAGNOSIS — E538 Deficiency of other specified B group vitamins: Secondary | ICD-10-CM

## 2016-05-12 NOTE — Progress Notes (Signed)
Carelink Summary Report / Loop Recorder 

## 2016-05-12 NOTE — Progress Notes (Signed)
Pt given Vit B12 inj Tolerated well 

## 2016-05-19 DIAGNOSIS — K760 Fatty (change of) liver, not elsewhere classified: Secondary | ICD-10-CM | POA: Diagnosis not present

## 2016-05-19 DIAGNOSIS — K740 Hepatic fibrosis: Secondary | ICD-10-CM | POA: Diagnosis not present

## 2016-05-23 ENCOUNTER — Ambulatory Visit: Payer: Medicare Other | Admitting: Neurology

## 2016-05-23 DIAGNOSIS — M5416 Radiculopathy, lumbar region: Secondary | ICD-10-CM | POA: Diagnosis not present

## 2016-05-23 DIAGNOSIS — M47816 Spondylosis without myelopathy or radiculopathy, lumbar region: Secondary | ICD-10-CM | POA: Diagnosis not present

## 2016-05-23 DIAGNOSIS — M545 Low back pain: Secondary | ICD-10-CM | POA: Diagnosis not present

## 2016-05-27 LAB — CUP PACEART REMOTE DEVICE CHECK
Implantable Pulse Generator Implant Date: 20170703
MDC IDC SESS DTM: 20180228193757

## 2016-06-03 ENCOUNTER — Telehealth: Payer: Self-pay | Admitting: Cardiology

## 2016-06-03 NOTE — Telephone Encounter (Signed)
Pt wife called and stated that Dr. Melven Sartorius office wanted a form to be faxed to Dr. Melven Sartorius office. I informed pt wife that we do not have a form but I would call Dr. Melven Sartorius office and speak with his nurse. I also informed pt wife that pt would need to send a manual transmission w/ his home monitor prior to MRI appointment. Pt wife verbalized understanding.  Spoke w/ Kylie form [facility] and [employer]. Informed her that we could only give a verbal verifying that it is ok for pt to have MRI and that his loop recorder is MRI compatible. Informed her that I told pt wife to have pt send a manual transmission w/ his home monitor prior to MRI appointment. Kylie verbalized understanding and took verbal ok. She also took [place] number for any additional help she may need.

## 2016-06-06 DIAGNOSIS — M5416 Radiculopathy, lumbar region: Secondary | ICD-10-CM | POA: Diagnosis not present

## 2016-06-10 ENCOUNTER — Ambulatory Visit (INDEPENDENT_AMBULATORY_CARE_PROVIDER_SITE_OTHER): Payer: Medicare Other | Admitting: *Deleted

## 2016-06-10 DIAGNOSIS — I639 Cerebral infarction, unspecified: Secondary | ICD-10-CM

## 2016-06-13 ENCOUNTER — Ambulatory Visit (INDEPENDENT_AMBULATORY_CARE_PROVIDER_SITE_OTHER): Payer: Medicare Other | Admitting: *Deleted

## 2016-06-13 DIAGNOSIS — E538 Deficiency of other specified B group vitamins: Secondary | ICD-10-CM

## 2016-06-13 NOTE — Progress Notes (Signed)
Pt given vit B12 inj Tolerated well 

## 2016-06-13 NOTE — Progress Notes (Signed)
Carelink Summary Report / Loop Recorder 

## 2016-06-15 DIAGNOSIS — M5416 Radiculopathy, lumbar region: Secondary | ICD-10-CM | POA: Diagnosis not present

## 2016-06-15 DIAGNOSIS — M5126 Other intervertebral disc displacement, lumbar region: Secondary | ICD-10-CM | POA: Diagnosis not present

## 2016-06-23 LAB — CUP PACEART REMOTE DEVICE CHECK
Date Time Interrogation Session: 20180330200632
MDC IDC PG IMPLANT DT: 20170703

## 2016-06-30 DIAGNOSIS — R531 Weakness: Secondary | ICD-10-CM | POA: Diagnosis not present

## 2016-06-30 DIAGNOSIS — R404 Transient alteration of awareness: Secondary | ICD-10-CM | POA: Diagnosis not present

## 2016-06-30 DIAGNOSIS — R110 Nausea: Secondary | ICD-10-CM | POA: Diagnosis not present

## 2016-07-07 ENCOUNTER — Ambulatory Visit (INDEPENDENT_AMBULATORY_CARE_PROVIDER_SITE_OTHER): Payer: Medicare Other | Admitting: Neurology

## 2016-07-07 ENCOUNTER — Encounter: Payer: Self-pay | Admitting: Neurology

## 2016-07-07 VITALS — BP 111/76 | HR 66 | Wt 161.2 lb

## 2016-07-07 DIAGNOSIS — G3184 Mild cognitive impairment, so stated: Secondary | ICD-10-CM

## 2016-07-07 MED ORDER — COENZYME Q10 30 MG PO CAPS: 200.0000 mg | ORAL_CAPSULE | Freq: Every day | ORAL | 1 refills | Status: DC

## 2016-07-07 NOTE — Progress Notes (Signed)
[facility] [address] [place]. [place] [address] [phone]       OFFICE FOLLOW-UP NOTE  [name] Date of Birth:  January 22, 1951 Medical Record Number:  [id number]   HPI: [name] is a pleasant 66 year old Caucasian male seen today for first office follow-up visit following hospital admission for stroke on 09/11/15. He is accompanied today by his wife. Cameron Roman is an 66 y.o. male history diabetes mellitus and hypertension who is admitted with complaint of near syncopal episode on 09/10/2015. Patient felt lightheaded and dizzy as well as nauseated. He sat on the ground and was unable to get back to his feet initially. His wife described him as being very pale. There were no changes in speech. No facial droop was described. He had no extremity weakness at the time. He gives a history of transient left upper extremity weakness 2 days ago, for which he did not seek medical attention. 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 Patient has not been on antiplatelet therapy. LSN: 09/10/2015 tPA Given: No: Beyond time window for treatment; no deficits. MRI scan of the brain showed small bilateral cerebellar and occipital nonhemorrhagic infarcts. MRA of the brain showed no large vessel stenosis. Carotid ultrasound showed no significant extracranial stenosis. Transthoracic echo showed normal ejection fraction. Transesophageal echo showed no cardiac source of embolism. Patient had loop recorder the inserted on 09/14/15. LDL cholesterol was borderline at 72 mg percent. Hemoglobin A1c was 5.6. Patient was started on aspirin for stroke prevention and advise strict control of hypertension and lipids. He states his done well since discharge. His recovered neurologically to his baseline. His able to resume all activities prior to his stroke. His vision has cleared. Left upper extremity strength is back to normal. Is doing well.  His blood pressure is usually well controlled though it is elevated at 145/93 in office today. He is tolerating Lipitor well without muscle aches or pains. He has no new complaints today. Update 07/07/2016 ; he returns for follow-up after last visit 6 months ago. He is accompanied by his wife. He complains of short-term memory difficulties ever since his stroke. He has trouble remembering what he was doing but can remember short while later. He is still independent in all activities of daily living. Denies any headaches or new focal neurological symptoms. He does take fish oil every day. Is tolerating aspirin well without significant bleeding or bruising. States his blood pressure very well controlled and today it is 111/70. Remains on Pravachol 20 mg daily but does complain of muscle aches and pains. He had lipid profile checked 3 months ago by his primary physician and was satisfactory. Is no other new complaints today. ROS:   14 system review of systems is positive for memory loss only all other systems negative  PMH:  Past Medical History:  Diagnosis Date  . Arthritis   . Chronic back pain    buldging disc  . Chronic neck pain    ruptured disc  . Cough    hx smoking  . Diverticulosis   . Esophageal stricture   . GERD (gastroesophageal reflux disease)    takes Prilosec daily  . H/O hiatal hernia   . Hearing loss   . Hiatal hernia   . History of colon polyps   . Insomnia    takes Elavil nightly  . 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 Stroke [facility])     Social History:  Social History   Social History  . Marital status: Married    Spouse name: N/A  . Number of children: 2  . Years of education: N/A   Occupational History  . disabled Disabled   Social History Main Topics  . Smoking status: Former Smoker    Packs/day: 1.00    Years: 41.00    Types: Cigarettes    Quit date: 06/29/2002  . Smokeless tobacco: Never Used     Comment: quit 29yr ago  . Alcohol use 0.6 oz/week      1 Cans of beer per week     Comment: occasional less than one a week  . Drug use: No  . Sexual activity: Yes   Other Topics Concern  . Not on file   Social History Narrative   Lives with wife in a one story home.  Has 2 children.     Currently does not work.  On disability for wrist pain since early 2000s.   Formerly a tAdministrator    Medications:   Current Outpatient Prescriptions on File Prior to Visit  Medication Sig Dispense Refill  . amitriptyline (ELAVIL) 50 MG tablet Take 1 tablet (50 mg total) by mouth at bedtime. 30 tablet 11  . aspirin EC 325 MG tablet Take 1 tablet (325 mg total) by mouth daily. 30 tablet 0  . Cyanocobalamin (B-12) 1000 MCG/ML KIT Inject 1 mL as directed See admin instructions. Started 09/10/15, 1 injection daily for 7 days them reduce to 1 injection once weekly, then eventually to once monthly    . fluticasone (FLONASE) 50 MCG/ACT nasal spray Place 2 sprays into both nostrils daily. 16 g 6  . gabapentin (NEURONTIN) 300 MG capsule Take 1 capsule (300 mg total) by mouth 3 (three) times daily. 90 capsule 11  . HARVONI 90-400 MG TABS     . HYDROcodone-acetaminophen (NORCO) 10-325 MG tablet Take 1 tablet by mouth 3 (three) times daily.    .Marland Kitchenlisinopril (PRINIVIL,ZESTRIL) 10 MG tablet Take 1 tablet (10 mg total) by mouth daily. 30 tablet 3  . Omega-3 Fatty Acids (FISH OIL) 1000 MG CAPS Take 2,000 mg by mouth 2 (two) times daily.    .Marland Kitchenomeprazole (PRILOSEC) 20 MG capsule Take 1 capsule (20 mg total) by mouth daily. 30 capsule 11  . pravastatin (PRAVACHOL) 20 MG tablet Take 1 tablet (20 mg total) by mouth daily. 90 tablet 3   Current Facility-Administered Medications on File Prior to Visit  Medication Dose Route Frequency Provider Last Rate Last Dose  . cyanocobalamin ((VITAMIN B-12)) injection 1,000 mcg  1,000 mcg Intramuscular Q30 days STimmothy Euler MD   1,000 mcg at 06/13/16 03704   Allergies:   Allergies  Allergen Reactions  . Percocet  [Oxycodone-Acetaminophen] Other (See Comments)    Hallucinations, inability to sleep.    Physical Exam General: well developed, well nourished middle-age Caucasian male, seated, in no evident distress Head: head normocephalic and atraumatic.  Neck: supple with no carotid or supraclavicular bruits Cardiovascular: regular rate and rhythm, no murmurs Musculoskeletal: no deformity Skin:  no rash/petichiae Vascular:  Normal pulses all extremities Vitals:   07/07/16 1027  BP: 111/76  Pulse: 66   Neurologic Exam Mental Status: Awake and fully alert. Oriented to place and time. Recent and remote memory intact. But diminished recall 2/3. Able to name 12 animals with 4 legs. Attention span, concentration and fund of knowledge appropriate. Mood and affect appropriate.  Cranial Nerves: Fundoscopic exam reveals sharp disc margins.  Pupils equal, briskly reactive to light. Extraocular movements full without nystagmus. Visual fields full to confrontation. Hearing intact. Facial sensation intact. Face, tongue, palate moves normally and symmetrically.  Motor: Normal bulk and tone. Normal strength in all tested extremity muscles. Sensory.: intact to touch ,pinprick .position and vibratory sensation.  Coordination: Rapid alternating movements normal in all extremities. Finger-to-nose and heel-to-shin performed accurately bilaterally. Gait and Station: Arises from chair without difficulty. Stance is normal. Gait demonstrates normal stride length and balance . Able to heel, toe and tandem walk with slight difficulty.  Reflexes: 1+ and symmetric. Toes downgoing.   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 Vascular risk factors of hypertension hyperlipidemia. 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 I also advised the patient to eat a healthy diet with plenty of whole grains, cereals, fruits and vegetables, exercise regularly and maintain ideal body weight . I advised him to take fish oil every day as well as prevention for his mild cognitive impairment. We also discussed memory compensation strategies. I also advised him to take CoQ10 200 mg daily for his statin myalgias. No scheduled follow-up appointment with me is necessary but he may be referred back by his primary physician in the future as needed Greater than 50% of time during this 25 minute visit was spent on counseling,explanation of diagnosis of mild cognitive impairment, planning of further management, discussion with patient and family and coordination of care Cameron Contras, MD  [facility] [address] [place], [place] [address]  Phone [phone] Fax [phone] Note: This document was prepared with digital dictation and possible smart phrase technology. Any transcriptional errors that result from this process are unintentional

## 2016-07-07 NOTE — Patient Instructions (Signed)
I had a long d/w patient about his remote cryptogenic stroke, risk for recurrent stroke/TIAs, personally independently reviewed imaging studies and stroke evaluation results and answered questions.Continue aspirin 325 mg daily  for secondary stroke prevention and maintain strict control of hypertension with blood pressure goal below 130/90, diabetes with hemoglobin A1c goal below 6.5% and lipids with LDL cholesterol goal below 70 mg/dL. I also advised the patient to eat a healthy diet with plenty of whole grains, cereals, fruits and vegetables, exercise regularly and maintain ideal body weight . I advised him to take fish oil every day as well as prevention for his mild cognitive impairment. We also discussed memory compensation strategies. I also advised him to take CoQ10 200 mg daily for his statin myalgias. No scheduled follow-up appointment with me is necessary but he may be referred back by his primary physician in the future as needed  Memory Compensation Strategies  1. Use "WARM" strategy.  W= write it down  A= associate it  R= repeat it  M= make a mental note  2.   You can keep a Social worker.  Use a 3-ring notebook with sections for the following: calendar, important names and phone numbers,  medications, doctors' names/phone numbers, lists/reminders, and a section to journal what you did  each day.   3.    Use a calendar to write appointments down.  4.    Write yourself a schedule for the day.  This can be placed on the calendar or in a separate section of the Memory Notebook.  Keeping a  regular schedule can help memory.  5.    Use medication organizer with sections for each day or morning/evening pills.  You may need help loading it  6.    Keep a basket, or pegboard by the door.  Place items that you need to take out with you in the basket or on the pegboard.  You may also want to  include a message board for reminders.  7.    Use sticky notes.  Place sticky notes with  reminders in a place where the task is performed.  For example: " turn off the  stove" placed by the stove, "lock the door" placed on the door at eye level, " take your medications" on  the bathroom mirror or by the place where you normally take your medications.  8.    Use alarms/timers.  Use while cooking to remind yourself to check on food or as a reminder to take your medicine, or as a  reminder to make a call, or as a reminder to perform another task, etc.

## 2016-07-11 ENCOUNTER — Ambulatory Visit (INDEPENDENT_AMBULATORY_CARE_PROVIDER_SITE_OTHER): Payer: Medicare Other | Admitting: *Deleted

## 2016-07-11 ENCOUNTER — Other Ambulatory Visit: Payer: Self-pay | Admitting: Internal Medicine

## 2016-07-11 DIAGNOSIS — I639 Cerebral infarction, unspecified: Secondary | ICD-10-CM | POA: Diagnosis not present

## 2016-07-11 NOTE — Progress Notes (Signed)
Carelink Summary Report / Loop Recorder 

## 2016-07-13 ENCOUNTER — Ambulatory Visit: Payer: Medicare Other

## 2016-07-13 ENCOUNTER — Ambulatory Visit (INDEPENDENT_AMBULATORY_CARE_PROVIDER_SITE_OTHER): Payer: Medicare Other | Admitting: *Deleted

## 2016-07-13 DIAGNOSIS — E538 Deficiency of other specified B group vitamins: Secondary | ICD-10-CM

## 2016-07-13 NOTE — Progress Notes (Signed)
Pt given vit B12 inj Tolerated well 

## 2016-07-20 ENCOUNTER — Telehealth: Payer: Self-pay | Admitting: Cardiology

## 2016-07-20 NOTE — Telephone Encounter (Signed)
Spoke w/ pt wife and requested that pt send a remote transmission w/ his home monitor b/c his home monitor has not updated in the last 14 days. Pt wife verbalized understanding.

## 2016-07-24 LAB — CUP PACEART REMOTE DEVICE CHECK
Implantable Pulse Generator Implant Date: 20170703
MDC IDC SESS DTM: 20180429203824

## 2016-07-24 NOTE — Progress Notes (Signed)
Carelink summary report received. Battery status OK. Normal device function. No new symptom episodes, tachy episodes, brady, or pause episodes. No new AF episodes. Monthly summary reports and ROV/PRN 

## 2016-07-27 ENCOUNTER — Encounter: Payer: Self-pay | Admitting: Internal Medicine

## 2016-07-27 ENCOUNTER — Encounter: Payer: Self-pay | Admitting: Cardiology

## 2016-07-27 ENCOUNTER — Ambulatory Visit (INDEPENDENT_AMBULATORY_CARE_PROVIDER_SITE_OTHER): Payer: Medicare Other | Admitting: Internal Medicine

## 2016-07-27 VITALS — BP 118/62 | HR 64 | Ht 71.0 in | Wt 158.4 lb

## 2016-07-27 DIAGNOSIS — R1310 Dysphagia, unspecified: Secondary | ICD-10-CM | POA: Diagnosis not present

## 2016-07-27 DIAGNOSIS — Z86010 Personal history of colonic polyps: Secondary | ICD-10-CM

## 2016-07-27 DIAGNOSIS — K219 Gastro-esophageal reflux disease without esophagitis: Secondary | ICD-10-CM

## 2016-07-27 DIAGNOSIS — K222 Esophageal obstruction: Secondary | ICD-10-CM | POA: Diagnosis not present

## 2016-07-27 DIAGNOSIS — K625 Hemorrhage of anus and rectum: Secondary | ICD-10-CM

## 2016-07-27 MED ORDER — NA SULFATE-K SULFATE-MG SULF 17.5-3.13-1.6 GM/177ML PO SOLN: 1.0000 | Freq: Once | ORAL | 0 refills | Status: AC

## 2016-07-27 MED ORDER — OMEPRAZOLE 20 MG PO CPDR: DELAYED_RELEASE_CAPSULE | ORAL | 11 refills | Status: DC

## 2016-07-27 NOTE — Progress Notes (Signed)
HISTORY OF PRESENT ILLNESS:  Cameron Roman is a 66 y.o. male with degenerative spine disease for which she is undergone prior neck and back surgeries, GERD complicated by peptic stricture requiring esophageal dilation, adenomatous colon polyps, diverticulosis, and a family history of colon cancer. He was last evaluated 06/30/2015 regarding ongoing management of his complicated GERD's. See that dictation. He presents today with chief complaints of rectal bleeding and worsening dysphagia. He is accompanied by his wife. First, he reports developing constipation associated with abdominal discomfort approximately one month ago. After having a somewhat uncomfortable bowel movement he noticed red blood per rectum. This occurred several times in 2 days. None since. Bowel habits have reverted back to baseline. He continues on omeprazole 20 mg daily. No classic reflux symptoms. However he has had somewhat worsening intermittent solid food dysphagia. Remote EGD with esophageal dilation 2011. His last colonoscopy was 2013. Adenomatous polyp. Due for follow-up this year. Overall his health has been stable. Does tell me that he was said to had remote cerebrovascular accidents. He is on full dose aspirin after having seen and subsequently been released by neurology.  REVIEW OF SYSTEMS:  All non-GI ROS negative except for back pain, cough, arthritis  Past Medical History:  Diagnosis Date  . Arthritis   . Chronic back pain    buldging disc  . Chronic neck pain    ruptured disc  . Cough    hx smoking  . Diverticulosis   . Esophageal stricture   . GERD (gastroesophageal reflux disease)    takes Prilosec daily  . H/O hiatal hernia   . Hearing loss   . Hepatitis-C 2017  . Hiatal hernia   . History of colon polyps   . Insomnia    takes Elavil nightly  . 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 Stroke [facility])     Past Surgical History:  Procedure Laterality Date  . ANTERIOR CERVICAL DECOMP/DISCECTOMY FUSION   04/12/2011   Procedure: ANTERIOR CERVICAL DECOMPRESSION/DISCECTOMY FUSION 2 LEVELS;  Surgeon: Peggyann Shoals, MD;  Location: [facility];  Service: Neurosurgery;  Laterality: N/A;  exploration of Cervical four - seven  fusion with redo Cervical six- seven, cervical three- four anterior cervical decompression with fusion interbody prothesis plating and bonegraft and C34 anterior cervical decompression with inte  . APPENDECTOMY     at age 80  . CARDIAC CATHETERIZATION  06/03/04  . COLONOSCOPY    . EP IMPLANTABLE DEVICE N/A 09/14/2015   Procedure: Loop Recorder Insertion;  Surgeon: Evans Lance, MD;  Location: [facility];  Service: Cardiovascular;  Laterality: N/A;  . HAND SURGERY Right 2005   right  . INNER EAR SURGERY Bilateral    bil;titanium in both ears  . lower back surgery  2009   had plates and screws inserted  . NECK SURGERY  2009   had insertion of  plates and screws  . TEE WITHOUT CARDIOVERSION N/A 09/14/2015   Procedure: TRANSESOPHAGEAL ECHOCARDIOGRAM (TEE);  Surgeon: Thayer Headings, MD;  Location: [facility];  Service: Cardiovascular;  Laterality: N/A;    Social History Haim Hansson  reports that he quit smoking about 14 years ago. His smoking use included Cigarettes. He has a 41.00 pack-year smoking history. He has never used smokeless tobacco. He reports that he drinks about 0.6 oz of alcohol per week . He reports that he does not use drugs.  family history includes Cancer in his brother; Colon cancer in his father, maternal grandfather, and maternal grandmother;  Healthy in his daughter and son; Heart disease in his maternal uncle; Other in his mother; Stroke in his maternal uncle.  Allergies  Allergen Reactions  . Percocet [Oxycodone-Acetaminophen] Other (See Comments)    Hallucinations, inability to sleep.       PHYSICAL EXAMINATION: Vital signs: BP 118/62   Pulse 64   Ht '5\' 11"'$  (1.803 m)   Wt 158 lb 6.4 oz (71.8 kg)   BMI 22.09 kg/m   Constitutional:  Thin, generally well-appearing, no acute distress Psychiatric: alert and oriented x3, cooperative Eyes: extraocular movements intact, anicteric, conjunctiva pink Mouth: oral pharynx moist, no lesions Neck: supple without thyromegaly Lymph: no lymphadenopathy Cardiovascular: heart regular rate and rhythm, no murmur Lungs: clear to auscultation bilaterally Abdomen: soft, nontender, nondistended, no obvious ascites, no peritoneal signs, normal bowel sounds, no organomegaly Rectal: Deferred until colonoscopy Extremities: no clubbing cyanosis or lower extremity edema bilaterally Skin: no lesions on visible extremities Neuro: No focal deficits. Cranial nerves intact  ASSESSMENT:  #1. Intermittent rectal bleeding. Likely secondary to transient fissure or internal hemorrhoids based on history #2. History of adenomatous colon polyps. Due for surveillance #3. Family history of colon cancer #4. GERD complicated by peptic stricture. Classic reflux symptoms controlled with PPI #5. Recurrent somewhat worsening dysphagia likely secondary to known peptic stricture #6. Multiple medical problems. Stable  PLAN:  #1. Schedule colonoscopy for polyp surveillance and to evaluate transient rectal bleeding. Patient is high-risk given his comorbidities.The nature of the procedure, as well as the risks, benefits, and alternatives were carefully and thoroughly reviewed with the patient. Ample time for discussion and questions allowed. The patient understood, was satisfied, and agreed to proceed. #2. Schedule upper endoscopy with probable esophageal dilation. Patient is high-risk given his comorbidities.The nature of the procedure, as well as the risks, benefits, and alternatives were carefully and thoroughly reviewed with the patient. Ample time for discussion and questions allowed. The patient understood, was satisfied, and agreed to proceed. #3. Continue omeprazole 20 mg daily #4. Reflux precautions #5.  Omeprazole prescription refilled #6. Increased dietary fiber

## 2016-07-27 NOTE — Patient Instructions (Signed)
We have sent the following medications to your pharmacy for you to pick up at your convenience:  Omeprazole  You have been scheduled for an endoscopy and colonoscopy. Please follow the written instructions given to you at your visit today. Please pick up your prep supplies at the pharmacy within the next 1-3 days. If you use inhalers (even only as needed), please bring them with you on the day of your procedure. Your physician has requested that you go to www.[email or web address].com and enter the access code given to you at your visit today. This web site gives a general overview about your procedure. However, you should still follow specific instructions given to you by our office regarding your preparation for the procedure.   

## 2016-08-03 ENCOUNTER — Other Ambulatory Visit: Payer: Self-pay | Admitting: Family Medicine

## 2016-08-03 DIAGNOSIS — I10 Essential (primary) hypertension: Secondary | ICD-10-CM

## 2016-08-09 ENCOUNTER — Ambulatory Visit (INDEPENDENT_AMBULATORY_CARE_PROVIDER_SITE_OTHER): Payer: Medicare Other | Admitting: *Deleted

## 2016-08-09 DIAGNOSIS — I639 Cerebral infarction, unspecified: Secondary | ICD-10-CM

## 2016-08-11 ENCOUNTER — Other Ambulatory Visit (HOSPITAL_COMMUNITY): Payer: Self-pay | Admitting: Nurse Practitioner

## 2016-08-11 DIAGNOSIS — K7400 Hepatic fibrosis, unspecified: Secondary | ICD-10-CM

## 2016-08-11 DIAGNOSIS — K760 Fatty (change of) liver, not elsewhere classified: Secondary | ICD-10-CM | POA: Diagnosis not present

## 2016-08-11 NOTE — Progress Notes (Signed)
Carelink Summary Report 

## 2016-08-12 LAB — CUP PACEART REMOTE DEVICE CHECK
Date Time Interrogation Session: 20180529204014
MDC IDC PG IMPLANT DT: 20170703

## 2016-08-15 ENCOUNTER — Ambulatory Visit (INDEPENDENT_AMBULATORY_CARE_PROVIDER_SITE_OTHER): Payer: Medicare Other | Admitting: *Deleted

## 2016-08-15 DIAGNOSIS — E538 Deficiency of other specified B group vitamins: Secondary | ICD-10-CM

## 2016-08-15 NOTE — Progress Notes (Signed)
Pt given Vit B12 inj Tolerated well 

## 2016-08-17 ENCOUNTER — Encounter: Payer: Self-pay | Admitting: Cardiology

## 2016-08-22 ENCOUNTER — Ambulatory Visit (HOSPITAL_COMMUNITY)
Admission: RE | Admit: 2016-08-22 | Discharge: 2016-08-22 | Disposition: A | Discharge status: Home / Self Care | Payer: Medicare Other | Source: Ambulatory Visit | Attending: Nurse Practitioner | Admitting: Nurse Practitioner

## 2016-08-22 DIAGNOSIS — K8020 Calculus of gallbladder without cholecystitis without obstruction: Secondary | ICD-10-CM | POA: Insufficient documentation

## 2016-08-22 DIAGNOSIS — K7400 Hepatic fibrosis, unspecified: Secondary | ICD-10-CM

## 2016-08-25 ENCOUNTER — Ambulatory Visit (INDEPENDENT_AMBULATORY_CARE_PROVIDER_SITE_OTHER): Payer: Medicare Other | Admitting: Family Medicine

## 2016-08-25 ENCOUNTER — Encounter: Payer: Self-pay | Admitting: Family Medicine

## 2016-08-25 VITALS — BP 126/78 | HR 70 | Temp 96.8°F | Ht 71.0 in | Wt 160.0 lb

## 2016-08-25 DIAGNOSIS — R229 Localized swelling, mass and lump, unspecified: Secondary | ICD-10-CM | POA: Diagnosis not present

## 2016-08-25 DIAGNOSIS — G4700 Insomnia, unspecified: Secondary | ICD-10-CM

## 2016-08-25 DIAGNOSIS — I10 Essential (primary) hypertension: Secondary | ICD-10-CM | POA: Diagnosis not present

## 2016-08-25 NOTE — Progress Notes (Signed)
   HPI  Patient presents today here for follow up chronic medical problems.   He feels well, he has no complaints except for some small nodules on his right rib edge. Patient states that it felt like one larger lump, similar to a lipoma on his right thigh, that then split up. Every once in while he has mild irritation. It has not seemed to change comments and present for about one year or more.  Patient denies any difficulty with medications.  He uses amitriptyline for sleep. It works very well, he denies prostate concerns.  He has a colonoscopy scheduled in early July, about 3 weeks away.  Hypertension Good medication compliance, no chest pain. No leg edema or headache  PMH: Smoking status noted ROS: Per HPI  Objective: BP 126/78   Pulse 70   Temp (!) 96.8 F (36 C) (Oral)   Ht 5\' 11"  (1.803 m)   Wt 160 lb (72.6 kg)   BMI 22.32 kg/m  Gen: NAD, alert, cooperative with exam HEENT: NCAT CV: RRR, good S1/S2, no murmur Resp: CTABL, no wheezes, non-labored Abd: 3 small subcutaneous nodules approximately 1/2 cm in diameter palpated at the right rib edge, mobile, nontender, no erythema, or induration. Ext: No edema, warm Neuro: Alert and oriented, No gross deficits  Assessment and plan:  # Subcutaneous nodule Abdominal wall, likely lipoma or subcutaneous cysts. Asymptomatic, reassurance provided, watchful waiting. 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   Laroy Apple, MD [facility] 08/25/2016, 8:18 AM

## 2016-09-05 ENCOUNTER — Encounter: Payer: Self-pay | Admitting: Internal Medicine

## 2016-09-08 ENCOUNTER — Encounter: Payer: Medicare Other | Admitting: *Deleted

## 2016-09-08 ENCOUNTER — Encounter: Payer: Self-pay | Admitting: Cardiology

## 2016-09-19 ENCOUNTER — Ambulatory Visit (AMBULATORY_SURGERY_CENTER): Payer: Medicare Other | Admitting: Internal Medicine

## 2016-09-19 ENCOUNTER — Encounter: Payer: Self-pay | Admitting: Internal Medicine

## 2016-09-19 VITALS — BP 152/92 | HR 63 | Temp 98.6°F | Resp 8 | Ht 71.0 in | Wt 158.0 lb

## 2016-09-19 DIAGNOSIS — D120 Benign neoplasm of cecum: Secondary | ICD-10-CM

## 2016-09-19 DIAGNOSIS — K222 Esophageal obstruction: Secondary | ICD-10-CM | POA: Diagnosis not present

## 2016-09-19 DIAGNOSIS — D128 Benign neoplasm of rectum: Secondary | ICD-10-CM | POA: Diagnosis not present

## 2016-09-19 DIAGNOSIS — K219 Gastro-esophageal reflux disease without esophagitis: Secondary | ICD-10-CM | POA: Diagnosis not present

## 2016-09-19 DIAGNOSIS — R1310 Dysphagia, unspecified: Secondary | ICD-10-CM

## 2016-09-19 DIAGNOSIS — Z86010 Personal history of colonic polyps: Secondary | ICD-10-CM | POA: Diagnosis present

## 2016-09-19 DIAGNOSIS — D129 Benign neoplasm of anus and anal canal: Secondary | ICD-10-CM

## 2016-09-19 MED ORDER — SODIUM CHLORIDE 0.9 % IV SOLN: 500.0000 mL | INTRAVENOUS | Status: DC

## 2016-09-19 NOTE — Progress Notes (Signed)
Please refer to the blue and neon green sheets for instructions regarding diet and activity for the rest of today.Called to room to assist during endoscopic procedure.  Patient ID and intended procedure confirmed with present staff. Received instructions for my participation in the procedure from the performing physician.

## 2016-09-19 NOTE — Op Note (Signed)
[place] Patient Name: Cameron Roman Procedure Date: 09/19/2016 2:53 PM MRN: [id number] Endoscopist: Docia Chuck. Henrene Pastor , MD Age: 66 Referring MD:  Date of Birth: Mar 31, 1950 Gender: Male Account #: [id number] Procedure:                Upper GI endoscopy, with Venia Minks dilation of the                            esophagus 34F Indications:              Dysphagia, Esophageal reflux Medicines:                Monitored Anesthesia Care Procedure:                Pre-Anesthesia Assessment:                           - Prior to the procedure, a History and Physical                            was performed, and patient medications and                            allergies were reviewed. The patient's tolerance of                            previous anesthesia was also reviewed. The risks                            and benefits of the procedure and the sedation                            options and risks were discussed with the patient.                            All questions were answered, and informed consent                            was obtained. Prior Anticoagulants: The patient has                            taken no previous anticoagulant or antiplatelet                            agents. ASA Grade Assessment: III - A patient with                            severe systemic disease. After reviewing the risks                            and benefits, the patient was deemed in                            satisfactory condition to undergo the procedure.  After obtaining informed consent, the endoscope was                            passed under direct vision. Throughout the                            procedure, the patient's blood pressure, pulse, and                            oxygen saturations were monitored continuously. The                            Endoscope was introduced through the mouth, and                            advanced to the second part of  duodenum. The upper                            GI endoscopy was accomplished without difficulty.                            The patient tolerated the procedure well. Scope In: Scope Out: Findings:                 One mild benign-appearing, intrinsic stenosis was                            found 40 cm from the incisors. This measured 1.5 cm                            (inner diameter). The scope was withdrawn. Dilation                            was performed with a Maloney dilator with no                            resistance at 52 Fr.                           The exam of the esophagus was otherwise normal.                           The stomach was normal, save small hiatal hernia.                           The examined duodenum was normal.                           The cardia and gastric fundus were normal on                            retroflexion. Complications:            No immediate complications. Estimated Blood Loss:     Estimated blood loss: none. Impression:               -  Benign-appearing esophageal stenosis. Dilated.                           - Normal stomach.                           - Normal examined duodenum.                           - No specimens collected. Recommendation:           - Patient has a contact number available for                            emergencies. The signs and symptoms of potential                            delayed complications were discussed with the                            patient. Return to normal activities tomorrow.                            Written discharge instructions were provided to the                            patient.                           - Post dilation diet.                           - Continue present medications.                           - GI follow-up as needed [name], MD 09/19/2016 3:34:39 PM This report has been signed electronically.

## 2016-09-19 NOTE — Progress Notes (Signed)
Pt's states no medical or surgical changes since previsit or office visit. 

## 2016-09-19 NOTE — Patient Instructions (Signed)
YOU HAD AN ENDOSCOPIC PROCEDURE TODAY AT [facility]:   Refer to the procedure report that was given to you for any specific questions about what was found during the examination.  If the procedure report does not answer your questions, please call your gastroenterologist to clarify.  If you requested that your care partner not be given the details of your procedure findings, then the procedure report has been included in a sealed envelope for you to review at your convenience later.  YOU SHOULD EXPECT: Some feelings of bloating in the abdomen. Passage of more gas than usual.  Walking can help get rid of the air that was put into your GI tract during the procedure and reduce the bloating. If you had a lower endoscopy (such as a colonoscopy or flexible sigmoidoscopy) you may notice spotting of blood in your stool or on the toilet paper. If you underwent a bowel prep for your procedure, you may not have a normal bowel movement for a few days.  Please Note:  You might notice some irritation and congestion in your nose or some drainage.  This is from the oxygen used during your procedure.  There is no need for concern and it should clear up in a day or so.  SYMPTOMS TO REPORT IMMEDIATELY:   Following lower endoscopy (colonoscopy or flexible sigmoidoscopy):  Excessive amounts of blood in the stool  Significant tenderness or worsening of abdominal pains  Swelling of the abdomen that is new, acute  Fever of 100F or higher   Following upper endoscopy (EGD)  Vomiting of blood or coffee ground material  New chest pain or pain under the shoulder blades  Painful or persistently difficult swallowing  New shortness of breath  Fever of 100F or higher  Black, tarry-looking stools  For urgent or emergent issues, a gastroenterologist can be reached at any hour by calling [phone].   DIET: FOLLOW DILATION DIET TODAY!!  Drink plenty of fluids but you should avoid alcoholic beverages  for 24 hours.  ACTIVITY:  You should plan to take it easy for the rest of today and you should NOT DRIVE or use heavy machinery until tomorrow (because of the sedation medicines used during the test).    FOLLOW UP: Our staff will call the number listed on your records the next business day following your procedure to check on you and address any questions or concerns that you may have regarding the information given to you following your procedure. If we do not reach you, we will leave a message.  However, if you are feeling well and you are not experiencing any problems, there is no need to return our call.  We will assume that you have returned to your regular daily activities without incident.  If any biopsies were taken you will be contacted by phone or by letter within the next 1-3 weeks.  Please call us at [phone] if you have not heard about the biopsies in 3 weeks.    SIGNATURES/CONFIDENTIALITY: You and/or your care partner have signed paperwork which will be entered into your electronic medical record.  These signatures attest to the fact that that the information above on your After Visit Summary has been reviewed and is understood.  Full responsibility of the confidentiality of this discharge information lies with you and/or your care-partner.  Follow dilation diet today!  Please read over handouts about polyps, hemorrhoids, high fiber, and esophageal stricture  Await pathology  Please continue your normal  medications

## 2016-09-19 NOTE — Op Note (Signed)
[facility] Patient Name: Cameron Roman Procedure Date: 09/19/2016 2:53 PM MRN: [id number] Endoscopist: Docia Chuck. Henrene Pastor , MD Age: 66 Referring MD:  Date of Birth: [birth year] Gender: Male Account #: [id number] Procedure:                Colonoscopy, with cold snare polypectomy x 4 Indications:              High risk colon cancer surveillance: Personal                            history of adenoma (10 mm or greater in size), High                            risk colon cancer surveillance: Personal history of                            multiple (3 or more) adenomas. Previous                            examinations 2008, 2011, 2013 Medicines:                Monitored Anesthesia Care Procedure:                Pre-Anesthesia Assessment:                           - Prior to the procedure, a History and Physical                            was performed, and patient medications and                            allergies were reviewed. The patient's tolerance of                            previous anesthesia was also reviewed. The risks                            and benefits of the procedure and the sedation                            options and risks were discussed with the patient.                            All questions were answered, and informed consent                            was obtained. Prior Anticoagulants: The patient has                            taken no previous anticoagulant or antiplatelet                            agents. ASA Grade Assessment: III - A patient with  severe systemic disease. After reviewing the risks                            and benefits, the patient was deemed in                            satisfactory condition to undergo the procedure.                           After obtaining informed consent, the colonoscope                            was passed under direct vision. Throughout the                            procedure,  the patient's blood pressure, pulse, and                            oxygen saturations were monitored continuously. The                            Model CF-HQ190L [id number]) scope was introduced                            through the anus and advanced to the the cecum,                            identified by appendiceal orifice and ileocecal                            valve. The ileocecal valve, appendiceal orifice,                            and rectum were photographed. The quality of the                            bowel preparation was excellent. The colonoscopy                            was performed without difficulty. The patient                            tolerated the procedure well. The bowel preparation                            used was SUPREP. Scope In: 3:03:13 PM Scope Out: 3:21:26 PM Scope Withdrawal Time: 0 hours 14 minutes 34 seconds  Total Procedure Duration: 0 hours 18 minutes 13 seconds  Findings:                 Four polyps were found in the rectum and cecum. The                            polyps were 1 to 4 mm in size. These polyps  were                            removed with a cold snare. Resection and retrieval                            were complete.                           Internal hemorrhoids were found during                            retroflexion. The hemorrhoids were moderate.                           The exam was otherwise without abnormality on                            direct and retroflexion views. Complications:            No immediate complications. Estimated blood loss:                            None. Estimated Blood Loss:     Estimated blood loss: none. Impression:               - Four 1 to 4 mm polyps in the rectum and in the                            cecum, removed with a cold snare. Resected and                            retrieved.                           - Internal hemorrhoids.                           - The examination was  otherwise normal on direct                            and retroflexion views. Recommendation:           - Repeat colonoscopy in 3 - 5 years for                            surveillance.                           - Patient has a contact number available for                            emergencies. The signs and symptoms of potential                            delayed complications were discussed with the  patient. Return to normal activities tomorrow.                            Written discharge instructions were provided to the                            patient.                           - Resume previous diet.                           - Continue present medications.                           - Await pathology results.                           - EGD today. Please see report Docia Chuck. Henrene Pastor, MD 09/19/2016 3:26:15 PM This report has been signed electronically.

## 2016-09-20 ENCOUNTER — Telehealth: Payer: Self-pay | Admitting: *Deleted

## 2016-09-20 NOTE — Telephone Encounter (Signed)
  Follow up Call-  Call back number 09/19/2016  Post procedure Call Back phone  # [phone]  Permission to leave phone message Yes  Some recent data might be hidden     Patient questions:  Do you have a fever, pain , or abdominal swelling? No. Pain Score  0 *  Have you tolerated food without any problems? Yes.    Have you been able to return to your normal activities? Yes.    Do you have any questions about your discharge instructions: Diet   No. Medications  No. Follow up visit  No.  Do you have questions or concerns about your Care? No.  Actions: * If pain score is 4 or above: No action needed, pain <4.

## 2016-09-22 ENCOUNTER — Encounter: Payer: Self-pay | Admitting: Internal Medicine

## 2016-09-30 ENCOUNTER — Ambulatory Visit (INDEPENDENT_AMBULATORY_CARE_PROVIDER_SITE_OTHER): Payer: Medicare Other | Admitting: *Deleted

## 2016-09-30 DIAGNOSIS — E538 Deficiency of other specified B group vitamins: Secondary | ICD-10-CM

## 2016-09-30 NOTE — Progress Notes (Signed)
Pt given Cyanocobalamin 1043mcg inj Tolerated well

## 2016-10-07 ENCOUNTER — Encounter: Payer: Self-pay | Admitting: Cardiology

## 2016-10-24 ENCOUNTER — Telehealth: Payer: Self-pay | Admitting: Internal Medicine

## 2016-10-24 NOTE — Telephone Encounter (Signed)
New message  Pt wife call requesting to speak with RN about a certified letter pt received about sending a transmission. Please call back to discuss

## 2016-10-24 NOTE — Telephone Encounter (Signed)
Mrs. Votaw says she tried to hook the monitor up through the [facility] but wasn't able to send a signal. Appt made for [facility] 10/31/16. She will have Mr. Poyser bring monitor/adapters.

## 2016-10-31 ENCOUNTER — Ambulatory Visit (INDEPENDENT_AMBULATORY_CARE_PROVIDER_SITE_OTHER): Payer: Medicare Other | Admitting: *Deleted

## 2016-10-31 DIAGNOSIS — I639 Cerebral infarction, unspecified: Secondary | ICD-10-CM | POA: Diagnosis not present

## 2016-10-31 LAB — CUP PACEART INCLINIC DEVICE CHECK
Implantable Pulse Generator Implant Date: 20170703
MDC IDC SESS DTM: 20180820144341

## 2016-10-31 NOTE — Progress Notes (Signed)
Loop check in clinic. Last [employer] monitor connection was 07/27/16 due to poor cell signal at home. Battery status: good. R-waves 0.30mV. No symptom, brady, or pause episodes. 11 tachy episodes--ECGs appear SVT and ?AF, most episodes occurred on 09/24/16. 3 AF episodes (<0.1% burden), +ASA 325mg , longest 1hr 53min. Plan to review with GT for recommendations regarding Elsie. ROV with GT/R on 12/12/16.

## 2016-11-01 ENCOUNTER — Telehealth: Payer: Self-pay | Admitting: *Deleted

## 2016-11-01 DIAGNOSIS — I480 Paroxysmal atrial fibrillation: Secondary | ICD-10-CM | POA: Insufficient documentation

## 2016-11-01 MED ORDER — APIXABAN 5 MG PO TABS: 5.0000 mg | ORAL_TABLET | Freq: Two times a day (BID) | ORAL | 11 refills | Status: DC

## 2016-11-01 NOTE — Telephone Encounter (Signed)
LMOVM requesting return call.  Both cell number and second home number are out of service.  Per Dr. Lovena Le, plan to STOP ASA and START Eliquis 5mg  BID for new PAF noted on LINQ.  Plan for BMP prior to appointment with Dr. Lovena Le on 12/12/16.  Per Dr. Lovena Le, patient does not need to try to use home monitor anymore, as long as he agrees to call if he notes presyncopal or syncopal symptoms, or symptoms with AF episodes.

## 2016-11-01 NOTE — Telephone Encounter (Signed)
Spoke with patient and wife regarding Dr. Tanna Furry recommendations.  Patient is agreeable with plan to STOP ASA and START Eliquis 5mg  BID.  Offered to request samples from [place] office, but patient's wife states it will just be easier for them to go to the pharmacy to find out the cost.  Prescription sent electronically to patient's preferred pharmacy.  Patient requests that labs be ordered so that he can have them drawn at [place].  Patient agrees to keep home monitor in the event he needs it in the future.  Patient's wife agrees to call if he has any symptoms with AF or symptoms of presyncope/syncope in the future.  Plan to f/u every 6 months in the [facility] for review of episodes/burden.  Confirmed with [place] office that labs should be ordered through [facility].  BMET order placed.  Confirmed with patient, who agrees to have labs drawn prior to 10/1 appointment with Dr. Lovena Le.  He is appreciative of assistance and agrees to call our office if he has further questions or concerns, or if he has trouble affording Eliquis.

## 2016-11-02 ENCOUNTER — Ambulatory Visit (INDEPENDENT_AMBULATORY_CARE_PROVIDER_SITE_OTHER): Payer: Medicare Other

## 2016-11-02 ENCOUNTER — Ambulatory Visit: Payer: Medicare Other

## 2016-11-02 DIAGNOSIS — E538 Deficiency of other specified B group vitamins: Secondary | ICD-10-CM | POA: Diagnosis not present

## 2016-11-07 ENCOUNTER — Other Ambulatory Visit: Payer: Self-pay | Admitting: Family Medicine

## 2016-11-07 DIAGNOSIS — I10 Essential (primary) hypertension: Secondary | ICD-10-CM

## 2016-11-09 NOTE — Telephone Encounter (Signed)
Order also entered for CBC per recommendation from Dr. Lovena Le as patient is initiating Eliquis.

## 2016-11-09 NOTE — Addendum Note (Signed)
Addended by: Mechele Dawley on: 11/09/2016 09:47 AM   Modules accepted: Orders

## 2016-11-10 ENCOUNTER — Telehealth: Payer: Self-pay | Admitting: *Deleted

## 2016-11-10 NOTE — Telephone Encounter (Signed)
LMOM requesting call back to the [facility].  Will advise patient that per Dr. Lovena Le, orders were placed [facility]) for a CBC in addition to BMET.  Will remind patient to have these drawn before 12/12/16 appt with Dr. Lovena Le.

## 2016-11-10 NOTE — Telephone Encounter (Signed)
Spoke w/ pt and informed him about the lab work that needed to be done before 12-29-16. Informed him that the labs could be completed at his PCP office but he would need to call his PCP to see if he needed an appt or not. Pt verbalized understanding.

## 2016-12-06 ENCOUNTER — Ambulatory Visit (INDEPENDENT_AMBULATORY_CARE_PROVIDER_SITE_OTHER): Payer: Medicare Other | Admitting: *Deleted

## 2016-12-06 DIAGNOSIS — E538 Deficiency of other specified B group vitamins: Secondary | ICD-10-CM | POA: Diagnosis not present

## 2016-12-06 NOTE — Progress Notes (Signed)
Pt given Cyanocobalamin inj Tolerated well 

## 2016-12-08 ENCOUNTER — Other Ambulatory Visit: Payer: Medicare Other

## 2016-12-08 DIAGNOSIS — I480 Paroxysmal atrial fibrillation: Secondary | ICD-10-CM | POA: Diagnosis not present

## 2016-12-09 LAB — BASIC METABOLIC PANEL
BUN / CREAT RATIO: 13 (ref 10–24)
BUN: 12 mg/dL (ref 8–27)
CHLORIDE: 106 mmol/L (ref 96–106)
CO2: 22 mmol/L (ref 20–29)
Calcium: 9.1 mg/dL (ref 8.6–10.2)
Creatinine, Ser: 0.95 mg/dL (ref 0.76–1.27)
GFR calc Af Amer: 97 mL/min/{1.73_m2} (ref >59)
GFR calc non Af Amer: 84 mL/min/{1.73_m2} (ref >59)
GLUCOSE: 141 mg/dL — AB (ref 65–99)
Potassium: 4 mmol/L (ref 3.5–5.2)
Sodium: 140 mmol/L (ref 134–144)

## 2016-12-09 LAB — CBC WITH DIFFERENTIAL/PLATELET
BASOS ABS: 0.1 10*3/uL (ref 0.0–0.2)
Basos: 1 %
EOS (ABSOLUTE): 0.6 10*3/uL — ABNORMAL HIGH (ref 0.0–0.4)
EOS: 4 %
HEMATOCRIT: 37.5 % (ref 37.5–51.0)
Hemoglobin: 13 g/dL (ref 13.0–17.7)
Immature Grans (Abs): 0 10*3/uL (ref 0.0–0.1)
Immature Granulocytes: 0 %
Lymphocytes Absolute: 7.1 10*3/uL — ABNORMAL HIGH (ref 0.7–3.1)
Lymphs: 54 %
MCH: 33.7 pg — ABNORMAL HIGH (ref 26.6–33.0)
MCHC: 34.7 g/dL (ref 31.5–35.7)
MCV: 97 fL (ref 79–97)
Monocytes Absolute: 1.1 10*3/uL — ABNORMAL HIGH (ref 0.1–0.9)
Monocytes: 8 %
NEUTROS ABS: 4.4 10*3/uL (ref 1.4–7.0)
Neutrophils: 33 %
PLATELETS: 316 10*3/uL (ref 150–379)
RBC: 3.86 x10E6/uL — AB (ref 4.14–5.80)
RDW: 14.7 % (ref 12.3–15.4)
WBC: 13.3 10*3/uL — AB (ref 3.4–10.8)

## 2016-12-12 ENCOUNTER — Ambulatory Visit (INDEPENDENT_AMBULATORY_CARE_PROVIDER_SITE_OTHER): Payer: Medicare Other | Admitting: Internal Medicine

## 2016-12-12 ENCOUNTER — Encounter: Payer: Self-pay | Admitting: Internal Medicine

## 2016-12-12 VITALS — BP 118/82 | HR 62 | Ht 71.0 in | Wt 157.8 lb

## 2016-12-12 DIAGNOSIS — I480 Paroxysmal atrial fibrillation: Secondary | ICD-10-CM | POA: Diagnosis not present

## 2016-12-12 LAB — CUP PACEART INCLINIC DEVICE CHECK
Date Time Interrogation Session: 20181001140659
Implantable Pulse Generator Implant Date: 20170703

## 2016-12-12 NOTE — Progress Notes (Signed)
    HPI Cameron Roman returns today for ongoing evaluation and management of a cryptogenic stroke. In the interim, his implantable loop recorder has demonstrated paroxysmal atrial fibrillation. He is asymptomatic. He denies chest pain, shortness of breath, syncope, or peripheral edema. He remains active working in his garden and raising farm animals.  Allergies  Allergen Reactions  . Percocet [Oxycodone-Acetaminophen] Other (See Comments)    Hallucinations, inability to sleep.     Current Outpatient Prescriptions  Medication Sig Dispense Refill  . amitriptyline (ELAVIL) 50 MG tablet Take 1 tablet (50 mg total) by mouth at bedtime. 30 tablet 11  . apixaban (ELIQUIS) 5 MG TABS tablet Take 1 tablet (5 mg total) by mouth 2 (two) times daily. 60 tablet 11  . Cyanocobalamin (B-12) 1000 MCG/ML KIT Inject 1 mL as directed See admin instructions. Started 09/10/15, 1 injection daily for 7 days them reduce to 1 injection once weekly, then eventually to once monthly    . fluticasone (FLONASE) 50 MCG/ACT nasal spray Place 2 sprays into both nostrils daily. 16 g 6  . gabapentin (NEURONTIN) 300 MG capsule Take 1 capsule (300 mg total) by mouth 3 (three) times daily. 90 capsule 11  . HYDROcodone-acetaminophen (NORCO) 10-325 MG tablet Take 1 tablet by mouth 3 (three) times daily.    . lisinopril (PRINIVIL,ZESTRIL) 10 MG tablet TAKE 1 TABLET DAILY 90 tablet 0  . Omega-3 Fatty Acids (FISH OIL) 1000 MG CAPS Take 2,000 mg by mouth 2 (two) times daily.    . omeprazole (PRILOSEC) 20 MG capsule TAKE (1) CAPSULE DAILY 30 capsule 11  . pravastatin (PRAVACHOL) 20 MG tablet Take 1 tablet (20 mg total) by mouth daily. 90 tablet 3   Current Facility-Administered Medications  Medication Dose Route Frequency Provider Last Rate Last Dose  . 0.9 %  sodium chloride infusion  500 mL Intravenous Continuous Perry, John N, MD      . cyanocobalamin ((VITAMIN B-12)) injection 1,000 mcg  1,000 mcg Intramuscular Q30 days Bradshaw,  Samuel L, MD   1,000 mcg at 12/06/16 0838     Past Medical History:  Diagnosis Date  . Arthritis   . Chronic back pain    buldging disc  . Chronic neck pain    ruptured disc  . Cough    hx smoking  . Diverticulosis   . Esophageal stricture   . GERD (gastroesophageal reflux disease)    takes Prilosec daily  . H/O hiatal hernia   . Hearing loss   . Hepatitis-C 2017  . Hiatal hernia   . History of colon polyps   . Insomnia    takes Elavil nightly  . 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 Stroke (HCC)     ROS:   All systems reviewed and negative except as noted in the HPI.   Past Surgical History:  Procedure Laterality Date  . ANTERIOR CERVICAL DECOMP/DISCECTOMY FUSION  04/12/2011   Procedure: ANTERIOR CERVICAL DECOMPRESSION/DISCECTOMY FUSION 2 LEVELS;  Surgeon: Joseph D Stern, MD;  Location: [facility] NEURO ORS;  Service: Neurosurgery;  Laterality: N/A;  exploration of Cervical four - seven  fusion with redo Cervical six- seven, cervical three- four anterior cervical decompression with fusion interbody prothesis plating and bonegraft and C34 anterior cervical decompression with inte  . APPENDECTOMY     at age 17  . CARDIAC CATHETERIZATION  06/03/04  . COLONOSCOPY    . EP IMPLANTABLE DEVICE N/A 09/14/2015   Procedure: Loop Recorder Insertion;  Surgeon: Gregg W Taylor, MD;    Location: [facility] INVASIVE CV LAB;  Service: Cardiovascular;  Laterality: N/A;  . HAND SURGERY Right 2005   right  . INNER EAR SURGERY Bilateral    bil;titanium in both ears  . lower back surgery  2009   had plates and screws inserted  . NECK SURGERY  2009   had insertion of  plates and screws  . TEE WITHOUT CARDIOVERSION N/A 09/14/2015   Procedure: TRANSESOPHAGEAL ECHOCARDIOGRAM (TEE);  Surgeon: Philip J Nahser, MD;  Location: [facility] ENDOSCOPY;  Service: Cardiovascular;  Laterality: N/A;     Family History  Problem Relation Age of Onset  . Colon cancer Father   . Other Mother        Perforated bowel  . Colon  cancer Maternal Grandmother   . Colon cancer Maternal Grandfather   . Heart disease Maternal Uncle   . Stroke Maternal Uncle   . Cancer Brother   . Healthy Son   . Healthy Daughter   . Anesthesia problems Neg Hx   . Hypotension Neg Hx   . Malignant hyperthermia Neg Hx   . Pseudochol deficiency Neg Hx      Social History   Social History  . Marital status: Married    Spouse name: N/A  . Number of children: 2  . Years of education: N/A   Occupational History  . disabled Disabled   Social History Main Topics  . Smoking status: Former Smoker    Packs/day: 1.00    Years: 41.00    Types: Cigarettes    Quit date: 06/29/2002  . Smokeless tobacco: Never Used     Comment: quit 10yrs ago  . Alcohol use 0.6 oz/week    1 Cans of beer per week     Comment: occasional less than one a week  . Drug use: No  . Sexual activity: Yes   Other Topics Concern  . Not on file   Social History Narrative   Lives with wife in a one story home.  Has 2 children.     Currently does not work.  On disability for wrist pain since early 2000s.   Formerly a truck driver.     BP 118/82   Pulse 62   Ht 5' 11" (1.803 m)   Wt 157 lb 12.8 oz (71.6 kg)   SpO2 97%   BMI 22.01 kg/m   Physical Exam:  Well appearing 66-year-old man, NAD HEENT: Unremarkable Neck:  6 cm  JVD, no thyromegally Lymphatics:  No adenopathy Back:  No CVA tenderness Lungs:  Clear, With no wheezes, rales, or rhonchi HEART:  Regular rate rhythm, no murmurs, no rubs, no clicks Abd:  soft, positive bowel sounds, no organomegally, no rebound, no guarding Ext:  2 plus pulses, no edema, no cyanosis, no clubbing Skin:  No rashes no nodules Neuro:  CN II through XII intact, motor grossly intact  DEVICE  Normal device function.  See PaceArt for detailMedtronic implantable loop recorder demonstrates 99.9% sinus rhythm.  Assess/Plan:  1. Paroxysmal atrial fibrillation - he is asymptomatic. He is tolerating systemic  anticoagulation. 2. Hypertension - his blood pressure today is well controlled. He will continue his current medications and maintain a low-sodium diet.  Gregg Taylor, M.D. 

## 2016-12-12 NOTE — Patient Instructions (Signed)
Medication Instructions:  Your physician recommends that you continue on your current medications as directed. Please refer to the Current Medication list given to you today.   Labwork: NONE   Testing/Procedures: NONE  Follow-Up: Your physician wants you to follow-up in: 1 Year with Dr. Lovena Le. You will receive a reminder letter in the mail two months in advance. If you don't receive a letter, please call our office to schedule the follow-up appointment.  Your physician recommends that you schedule a follow-up appointment in 6 Months with the [facility].      Any Other Special Instructions Will Be Listed Below (If Applicable).     If you need a refill on your cardiac medications before your next appointment, please call your pharmacy.  Thank you for choosing [facility]!

## 2016-12-26 ENCOUNTER — Other Ambulatory Visit: Payer: Self-pay | Admitting: Family Medicine

## 2016-12-27 ENCOUNTER — Telehealth: Payer: Self-pay | Admitting: Family Medicine

## 2017-01-06 ENCOUNTER — Ambulatory Visit (INDEPENDENT_AMBULATORY_CARE_PROVIDER_SITE_OTHER): Payer: Medicare Other | Admitting: *Deleted

## 2017-01-06 DIAGNOSIS — Z23 Encounter for immunization: Secondary | ICD-10-CM

## 2017-01-06 DIAGNOSIS — E538 Deficiency of other specified B group vitamins: Secondary | ICD-10-CM

## 2017-01-06 NOTE — Progress Notes (Signed)
Pt given Cyanocobalamin inj Pt also given flu vaccine Tolerated well

## 2017-01-26 DIAGNOSIS — K740 Hepatic fibrosis: Secondary | ICD-10-CM | POA: Diagnosis not present

## 2017-01-26 DIAGNOSIS — K760 Fatty (change of) liver, not elsewhere classified: Secondary | ICD-10-CM | POA: Diagnosis not present

## 2017-01-27 ENCOUNTER — Other Ambulatory Visit: Payer: Self-pay | Admitting: Nurse Practitioner

## 2017-01-27 ENCOUNTER — Other Ambulatory Visit: Payer: Self-pay | Admitting: Family Medicine

## 2017-01-27 DIAGNOSIS — K7400 Hepatic fibrosis, unspecified: Secondary | ICD-10-CM

## 2017-01-27 DIAGNOSIS — K760 Fatty (change of) liver, not elsewhere classified: Secondary | ICD-10-CM

## 2017-01-31 ENCOUNTER — Ambulatory Visit
Admission: RE | Admit: 2017-01-31 | Discharge: 2017-01-31 | Disposition: A | Discharge status: Home / Self Care | Payer: Medicare Other | Source: Ambulatory Visit | Attending: Nurse Practitioner | Admitting: Nurse Practitioner

## 2017-01-31 DIAGNOSIS — K7400 Hepatic fibrosis, unspecified: Secondary | ICD-10-CM

## 2017-01-31 DIAGNOSIS — K760 Fatty (change of) liver, not elsewhere classified: Secondary | ICD-10-CM

## 2017-01-31 DIAGNOSIS — K8020 Calculus of gallbladder without cholecystitis without obstruction: Secondary | ICD-10-CM | POA: Diagnosis not present

## 2017-02-04 ENCOUNTER — Other Ambulatory Visit: Payer: Self-pay | Admitting: Family Medicine

## 2017-02-04 DIAGNOSIS — I10 Essential (primary) hypertension: Secondary | ICD-10-CM

## 2017-02-27 ENCOUNTER — Other Ambulatory Visit: Payer: Self-pay | Admitting: Family Medicine

## 2017-02-28 NOTE — Telephone Encounter (Signed)
Last seen 08/25/16  Dr Wendi Snipes

## 2017-03-28 ENCOUNTER — Other Ambulatory Visit: Payer: Self-pay | Admitting: Family Medicine

## 2017-04-05 ENCOUNTER — Telehealth: Payer: Self-pay | Admitting: Internal Medicine

## 2017-04-05 NOTE — Telephone Encounter (Signed)
Spoke with pt., he stated he will come by [facility] office tomorrow and we will start the patient assistance process.

## 2017-04-05 NOTE — Telephone Encounter (Signed)
Pt c/o medication issue:  1. Name of Medication: Eliquis   2. How are you currently taking this medication (dosage and times per day)? 5mg // 1 pill twice a day   3. Are you having a reaction (difficulty breathing--STAT)? no  4. What is your medication issue? Patient was told to call back if co-pay for medical was too much and he could have a cheaper alternative

## 2017-04-06 ENCOUNTER — Other Ambulatory Visit: Payer: Self-pay

## 2017-04-06 DIAGNOSIS — I480 Paroxysmal atrial fibrillation: Secondary | ICD-10-CM

## 2017-04-06 MED ORDER — APIXABAN 5 MG PO TABS: 5.0000 mg | ORAL_TABLET | Freq: Two times a day (BID) | ORAL | 3 refills | Status: DC

## 2017-04-14 ENCOUNTER — Telehealth: Payer: Self-pay | Admitting: Internal Medicine

## 2017-04-14 NOTE — Telephone Encounter (Signed)
Cameron Roman w/ [employer] patient assistance called needing some information for this pt's application --please call [phone]

## 2017-04-18 NOTE — Telephone Encounter (Signed)
Returned call to [employer]. Spoke with Threasa Beards how states that pt has been denied for assistance as of 04/14/17. She states that pt can provide out of pocket expense to Rx of $160.96 for approval.  Wife Pamala Hurry notified and voiced understanding. Wife states that she will obtain print out from pharmacy and bring to office.

## 2017-04-24 DIAGNOSIS — H40033 Anatomical narrow angle, bilateral: Secondary | ICD-10-CM | POA: Diagnosis not present

## 2017-04-24 DIAGNOSIS — G44219 Episodic tension-type headache, not intractable: Secondary | ICD-10-CM | POA: Diagnosis not present

## 2017-05-10 ENCOUNTER — Other Ambulatory Visit: Payer: Self-pay | Admitting: Family Medicine

## 2017-05-10 DIAGNOSIS — I10 Essential (primary) hypertension: Secondary | ICD-10-CM

## 2017-05-17 ENCOUNTER — Other Ambulatory Visit: Payer: Self-pay | Admitting: Family Medicine

## 2017-06-01 ENCOUNTER — Telehealth: Payer: Self-pay | Admitting: *Deleted

## 2017-06-01 NOTE — Telephone Encounter (Signed)
Called pt to notify pt that patient assistance Eliquis has arrived in office. No answer. Left msg to call back.

## 2017-07-20 ENCOUNTER — Other Ambulatory Visit: Payer: Self-pay | Admitting: Nurse Practitioner

## 2017-07-20 DIAGNOSIS — K7400 Hepatic fibrosis, unspecified: Secondary | ICD-10-CM

## 2017-07-20 DIAGNOSIS — R7989 Other specified abnormal findings of blood chemistry: Secondary | ICD-10-CM | POA: Diagnosis not present

## 2017-07-20 DIAGNOSIS — K760 Fatty (change of) liver, not elsewhere classified: Secondary | ICD-10-CM | POA: Diagnosis not present

## 2017-07-24 ENCOUNTER — Ambulatory Visit
Admission: RE | Admit: 2017-07-24 | Discharge: 2017-07-24 | Disposition: A | Discharge status: Home / Self Care | Payer: Medicare Other | Source: Ambulatory Visit | Attending: Nurse Practitioner | Admitting: Nurse Practitioner

## 2017-07-24 DIAGNOSIS — K8020 Calculus of gallbladder without cholecystitis without obstruction: Secondary | ICD-10-CM | POA: Diagnosis not present

## 2017-07-24 DIAGNOSIS — K7400 Hepatic fibrosis, unspecified: Secondary | ICD-10-CM

## 2017-07-26 ENCOUNTER — Telehealth: Payer: Self-pay | Admitting: Family Medicine

## 2017-07-26 ENCOUNTER — Other Ambulatory Visit: Payer: Self-pay | Admitting: Family Medicine

## 2017-07-27 MED ORDER — AMITRIPTYLINE HCL 50 MG PO TABS: 50.0000 mg | ORAL_TABLET | Freq: Every day | ORAL | 3 refills | Status: DC

## 2017-07-27 NOTE — Telephone Encounter (Signed)
Patient aware.

## 2017-07-27 NOTE — Telephone Encounter (Signed)
Amitriptyline refilled  Laroy Apple, MD [facility] 07/27/2017, 11:38 AM

## 2017-08-08 ENCOUNTER — Other Ambulatory Visit: Payer: Self-pay | Admitting: Internal Medicine

## 2017-08-16 ENCOUNTER — Other Ambulatory Visit: Payer: Self-pay | Admitting: Family Medicine

## 2017-08-22 ENCOUNTER — Telehealth: Payer: Self-pay | Admitting: *Deleted

## 2017-08-22 NOTE — Telephone Encounter (Signed)
Call to notify patient assistance Eliquis has arrived in office. Pt voiced understanding.

## 2017-08-31 ENCOUNTER — Ambulatory Visit (INDEPENDENT_AMBULATORY_CARE_PROVIDER_SITE_OTHER): Payer: Medicare Other | Admitting: Family Medicine

## 2017-08-31 ENCOUNTER — Encounter: Payer: Self-pay | Admitting: Family Medicine

## 2017-08-31 ENCOUNTER — Ambulatory Visit (INDEPENDENT_AMBULATORY_CARE_PROVIDER_SITE_OTHER): Payer: Medicare Other

## 2017-08-31 VITALS — BP 137/80 | HR 70 | Temp 98.3°F | Ht 71.0 in | Wt 159.4 lb

## 2017-08-31 DIAGNOSIS — R7989 Other specified abnormal findings of blood chemistry: Secondary | ICD-10-CM | POA: Diagnosis not present

## 2017-08-31 DIAGNOSIS — M25431 Effusion, right wrist: Secondary | ICD-10-CM

## 2017-08-31 DIAGNOSIS — M7989 Other specified soft tissue disorders: Secondary | ICD-10-CM | POA: Diagnosis not present

## 2017-08-31 DIAGNOSIS — E538 Deficiency of other specified B group vitamins: Secondary | ICD-10-CM

## 2017-08-31 LAB — BAYER DCA HB A1C WAIVED: HB A1C (BAYER DCA - WAIVED): 5.7 % (ref <7.0)

## 2017-08-31 MED ORDER — PREDNISONE 20 MG PO TABS: ORAL_TABLET | ORAL | 0 refills | Status: DC

## 2017-08-31 NOTE — Progress Notes (Signed)
   HPI  Patient presents today right wrist swelling and pain.  Patient explains that his right wrist began to hurt 3 days ago without injury.  He soaked it and it continued to hurt and swell. He has limited mobility of the right wrist.  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  He states that about 5 to 6 days ago he did do some work around the house moving to buy sixes around, however this is not an unusual amount of work for him.  He does not recall any wrist injury or trauma.   PMH: Smoking status noted ROS: Per HPI  Objective: BP 137/80   Pulse 70   Temp 98.3 F (36.8 C) (Oral)   Ht _0  (1.803 m)   Wt 159 lb 6.4 oz (72.3 kg)   BMI 22.23 kg/m  Gen: NAD, alert, cooperative with exam HEENT: NCA CV: RRR, good S1/S2, no murmur Resp: CTABL, no wheezes, non-labored Ext: No edema, warm Neuro: Alert and oriented, No gross deficits MSK  Swelling of the right wrist, radial pulse easily palpable, brisk cap refill in all 5 fingers  Assessment and plan:  #Right wrist swelling Right wrist swelling and pain, sudden onset without injury or overuse. Patient has history of Buerger's disease about 20 years ago, this could be a recurrence of that, however he has stopped smoking. Given prednisone short course Plain film today does not show any fracture Refer to orthopedics, Dr. Berenice Primas, he previously performed surgery for the Buerger's disease Consider Cilostazol Labs checking broadly for inflammatory arthritis   Orders Placed This Encounter  Procedures  . DG Wrist Complete Right    Standing Status:   Future    Number of Occurrences:   1    Standing Expiration Date:   11/01/2018    Order Specific Question:   Reason for Exam (SYMPTOM  OR DIAGNOSIS REQUIRED)    Answer:   R distal ulna swelling    Order Specific Question:   Preferred imaging  location?    Answer:   Internal    Order Specific Question:   Radiology Contrast Protocol - do NOT remove file path    Answer:   [email or web address]  . Sedimentation Rate  . C-reactive protein  . CMP14+EGFR  . CBC with Differential/Platelet  . ANA,IFA RA Diag Pnl w/rflx Tit/Patn  . Bayer DCA Hb A1c Waived  . Uric acid  . Ambulatory referral to Orthopedic Surgery    Referral Priority:   Routine    Referral Type:   Surgical    Referral Reason:   Specialty Services Required    Requested Specialty:   Orthopedic Surgery    Number of Visits Requested:   1    Meds ordered this encounter  Medications  . predniSONE (DELTASONE) 20 MG tablet    Sig: 2 po at same time daily for 5 days    Dispense:  10 tablet    Refill:  0    Laroy Apple, MD [facility] 08/31/2017, 11:51 AM

## 2017-08-31 NOTE — Patient Instructions (Signed)
Great to see you!   

## 2017-09-02 LAB — CMP14+EGFR
A/G RATIO: 1.4 (ref 1.2–2.2)
ALBUMIN: 4.3 g/dL (ref 3.6–4.8)
ALK PHOS: 84 IU/L (ref 39–117)
ALT: 9 IU/L (ref 0–44)
AST: 15 IU/L (ref 0–40)
BILIRUBIN TOTAL: 0.3 mg/dL (ref 0.0–1.2)
BUN / CREAT RATIO: 10 (ref 10–24)
BUN: 9 mg/dL (ref 8–27)
CO2: 26 mmol/L (ref 20–29)
Calcium: 9.9 mg/dL (ref 8.6–10.2)
Chloride: 104 mmol/L (ref 96–106)
Creatinine, Ser: 0.88 mg/dL (ref 0.76–1.27)
GFR calc non Af Amer: 90 mL/min/{1.73_m2} (ref >59)
GFR, EST AFRICAN AMERICAN: 104 mL/min/{1.73_m2} (ref >59)
Globulin, Total: 3.1 g/dL (ref 1.5–4.5)
Glucose: 82 mg/dL (ref 65–99)
Potassium: 5.5 mmol/L — ABNORMAL HIGH (ref 3.5–5.2)
Sodium: 141 mmol/L (ref 134–144)
TOTAL PROTEIN: 7.4 g/dL (ref 6.0–8.5)

## 2017-09-02 LAB — CBC WITH DIFFERENTIAL/PLATELET
BASOS: 1 %
Basophils Absolute: 0.1 10*3/uL (ref 0.0–0.2)
EOS (ABSOLUTE): 0.3 10*3/uL (ref 0.0–0.4)
EOS: 3 %
HEMATOCRIT: 37.9 % (ref 37.5–51.0)
Hemoglobin: 13.1 g/dL (ref 13.0–17.7)
IMMATURE GRANS (ABS): 0 10*3/uL (ref 0.0–0.1)
Immature Granulocytes: 0 %
LYMPHS: 48 %
Lymphocytes Absolute: 6.3 10*3/uL — ABNORMAL HIGH (ref 0.7–3.1)
MCH: 32.1 pg (ref 26.6–33.0)
MCHC: 34.6 g/dL (ref 31.5–35.7)
MCV: 93 fL (ref 79–97)
MONOCYTES: 9 %
Monocytes Absolute: 1.2 10*3/uL — ABNORMAL HIGH (ref 0.1–0.9)
NEUTROS PCT: 39 %
Neutrophils Absolute: 5 10*3/uL (ref 1.4–7.0)
Platelets: 300 10*3/uL (ref 150–450)
RBC: 4.08 x10E6/uL — AB (ref 4.14–5.80)
RDW: 15.3 % (ref 12.3–15.4)
WBC: 12.9 10*3/uL — AB (ref 3.4–10.8)

## 2017-09-02 LAB — SPECIMEN STATUS REPORT

## 2017-09-02 LAB — ANA,IFA RA DIAG PNL W/RFLX TIT/PATN
ANA Titer 1: NEGATIVE
Cyclic Citrullin Peptide Ab: 9 units (ref 0–19)

## 2017-09-02 LAB — URIC ACID: URIC ACID: 5.7 mg/dL (ref 3.7–8.6)

## 2017-09-02 LAB — C-REACTIVE PROTEIN: CRP: 4 mg/L (ref 0–10)

## 2017-09-02 LAB — SEDIMENTATION RATE: Sed Rate: 15 mm/hr (ref 0–30)

## 2017-09-04 ENCOUNTER — Other Ambulatory Visit: Payer: Self-pay

## 2017-09-04 DIAGNOSIS — E875 Hyperkalemia: Secondary | ICD-10-CM

## 2017-09-07 DIAGNOSIS — M25531 Pain in right wrist: Secondary | ICD-10-CM | POA: Diagnosis not present

## 2017-09-18 DIAGNOSIS — M5416 Radiculopathy, lumbar region: Secondary | ICD-10-CM | POA: Diagnosis not present

## 2017-09-18 DIAGNOSIS — M545 Low back pain: Secondary | ICD-10-CM | POA: Diagnosis not present

## 2017-09-18 DIAGNOSIS — M5412 Radiculopathy, cervical region: Secondary | ICD-10-CM | POA: Diagnosis not present

## 2017-09-18 DIAGNOSIS — M47816 Spondylosis without myelopathy or radiculopathy, lumbar region: Secondary | ICD-10-CM | POA: Diagnosis not present

## 2017-09-18 DIAGNOSIS — M542 Cervicalgia: Secondary | ICD-10-CM | POA: Diagnosis not present

## 2017-09-29 ENCOUNTER — Other Ambulatory Visit: Payer: Self-pay | Admitting: Family Medicine

## 2017-10-02 ENCOUNTER — Ambulatory Visit (INDEPENDENT_AMBULATORY_CARE_PROVIDER_SITE_OTHER): Payer: Medicare Other | Admitting: *Deleted

## 2017-10-02 DIAGNOSIS — E538 Deficiency of other specified B group vitamins: Secondary | ICD-10-CM

## 2017-10-02 NOTE — Progress Notes (Signed)
Pt given Cyanocobalamin inj Tolerated well 

## 2017-11-02 ENCOUNTER — Ambulatory Visit: Payer: Medicare Other

## 2017-11-06 ENCOUNTER — Other Ambulatory Visit: Payer: Self-pay | Admitting: Internal Medicine

## 2017-11-14 ENCOUNTER — Telehealth: Payer: Self-pay | Admitting: *Deleted

## 2017-11-14 NOTE — Telephone Encounter (Signed)
Call to notify pt that Patient assistance Eliquis has arrived. Pt not home. Left msg to call office.

## 2017-11-24 ENCOUNTER — Other Ambulatory Visit: Payer: Self-pay

## 2017-11-24 MED ORDER — PRAVASTATIN SODIUM 20 MG PO TABS: 20.0000 mg | ORAL_TABLET | Freq: Every day | ORAL | 0 refills | Status: DC

## 2017-11-24 NOTE — Telephone Encounter (Signed)
Last seen 08/31/17  Dr Wendi Snipes  Last lipid 12/23/15

## 2017-12-07 ENCOUNTER — Other Ambulatory Visit: Payer: Self-pay | Admitting: *Deleted

## 2017-12-07 MED ORDER — GABAPENTIN 300 MG PO CAPS: ORAL_CAPSULE | ORAL | 0 refills | Status: DC

## 2018-01-10 ENCOUNTER — Ambulatory Visit (INDEPENDENT_AMBULATORY_CARE_PROVIDER_SITE_OTHER): Payer: Medicare Other | Admitting: *Deleted

## 2018-01-10 DIAGNOSIS — Z23 Encounter for immunization: Secondary | ICD-10-CM

## 2018-01-10 DIAGNOSIS — E538 Deficiency of other specified B group vitamins: Secondary | ICD-10-CM

## 2018-01-16 ENCOUNTER — Encounter

## 2018-01-16 ENCOUNTER — Encounter: Payer: Self-pay | Admitting: Internal Medicine

## 2018-01-16 ENCOUNTER — Ambulatory Visit: Payer: Medicare Other | Admitting: Internal Medicine

## 2018-01-16 VITALS — BP 124/70 | HR 71 | Ht 71.0 in | Wt 163.0 lb

## 2018-01-16 DIAGNOSIS — I480 Paroxysmal atrial fibrillation: Secondary | ICD-10-CM

## 2018-01-16 NOTE — Patient Instructions (Addendum)
Medication Instructions:  Your physician has recommended you make the following change in your medication:  1.) STOP lisinopril   Labwork: none  Testing/Procedures: none  Follow-Up: Your physician wants you to follow-up in: 1 year with Dr. Lovena Le.  You will receive a reminder letter in the mail two months in advance. If you don't receive a letter, please call our office to schedule the follow-up appointment.   Any Other Special Instructions Will Be Listed Below (If Applicable). Please call or use MyChart to let us know how your cough is after stopping lisinopril and how your blood pressure is, and will make further recommendations on blood pressure medicines.    If you need a refill on your cardiac medications before your next appointment, please call your pharmacy.

## 2018-01-16 NOTE — Progress Notes (Signed)
HPI Mr. Evett returns today for ongoing evaluation and management of a cryptogenic stroke. In the interim, his implantable loop recorder has demonstrated paroxysmal atrial fibrillation. He is asymptomatic. He denies chest pain, shortness of breath, syncope, or peripheral edema. His only complaint today is a cough. He thinks it might be due to one of his blood pressure meds. No other complaints. Allergies  Allergen Reactions  . Percocet [Oxycodone-Acetaminophen] Other (See Comments)    Hallucinations, inability to sleep.     Current Outpatient Medications  Medication Sig Dispense Refill  . amitriptyline (ELAVIL) 50 MG tablet Take 1 tablet (50 mg total) by mouth at bedtime. 30 tablet 3  . apixaban (ELIQUIS) 5 MG TABS tablet Take 1 tablet (5 mg total) by mouth 2 (two) times daily. 180 tablet 3  . Cyanocobalamin (B-12) 1000 MCG/ML KIT Inject 1 mL as directed See admin instructions. Started 09/10/15, 1 injection daily for 7 days them reduce to 1 injection once weekly, then eventually to once monthly    . fluticasone (FLONASE) 50 MCG/ACT nasal spray Place 2 sprays into both nostrils daily. 16 g 6  . gabapentin (NEURONTIN) 300 MG capsule TAKE (1) CAPSULE THREE TIMES DAILY. (Needs to be seen before next refill) 90 capsule 0  . HYDROcodone-acetaminophen (NORCO) 10-325 MG tablet Take 1 tablet by mouth 3 (three) times daily.    . Omega-3 Fatty Acids (FISH OIL) 1000 MG CAPS Take 2,000 mg by mouth 2 (two) times daily.    Marland Kitchen omeprazole (PRILOSEC) 20 MG capsule TAKE (1) CAPSULE DAILY 30 capsule 3  . pravastatin (PRAVACHOL) 20 MG tablet Take 1 tablet (20 mg total) by mouth daily. 90 tablet 0  . predniSONE (DELTASONE) 20 MG tablet 2 po at same time daily for 5 days 10 tablet 0   Current Facility-Administered Medications  Medication Dose Route Frequency Provider Last Rate Last Dose  . 0.9 %  sodium chloride infusion  500 mL Intravenous Continuous Irene Shipper, MD      . cyanocobalamin ((VITAMIN  B-12)) injection 1,000 mcg  1,000 mcg Intramuscular Q30 days Timmothy Euler, MD   1,000 mcg at 01/10/18 1448     Past Medical History:  Diagnosis Date  . Arthritis   . Chronic back pain    buldging disc  . Chronic neck pain    ruptured disc  . Cough    hx smoking  . Diverticulosis   . Esophageal stricture   . GERD (gastroesophageal reflux disease)    takes Prilosec daily  . H/O hiatal hernia   . Hearing loss   . Hepatitis-C 2017  . Hiatal hernia   . History of colon polyps   . Insomnia    takes Elavil nightly  . 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 Stroke ([place])     ROS:   All systems reviewed and negative except as noted in the HPI.   Past Surgical History:  Procedure Laterality Date  . ANTERIOR CERVICAL DECOMP/DISCECTOMY FUSION  04/12/2011   Procedure: ANTERIOR CERVICAL DECOMPRESSION/DISCECTOMY FUSION 2 LEVELS;  Surgeon: Peggyann Shoals, MD;  Location: [place] NEURO ORS;  Service: Neurosurgery;  Laterality: N/A;  exploration of Cervical four - seven  fusion with redo Cervical six- seven, cervical three- four anterior cervical decompression with fusion interbody prothesis plating and bonegraft and C34 anterior cervical decompression with inte  . APPENDECTOMY     at age 44  . CARDIAC CATHETERIZATION  06/03/04  . COLONOSCOPY    . EP  IMPLANTABLE DEVICE N/A 09/14/2015   Procedure: Loop Recorder Insertion;  Surgeon: Evans Lance, MD;  Location: [place] CV LAB;  Service: Cardiovascular;  Laterality: N/A;  . HAND SURGERY Right 2005   right  . INNER EAR SURGERY Bilateral    bil;titanium in both ears  . lower back surgery  2009   had plates and screws inserted  . NECK SURGERY  2009   had insertion of  plates and screws  . TEE WITHOUT CARDIOVERSION N/A 09/14/2015   Procedure: TRANSESOPHAGEAL ECHOCARDIOGRAM (TEE);  Surgeon: Thayer Headings, MD;  Location: [facility] ENDOSCOPY;  Service: Cardiovascular;  Laterality: N/A;     Family History  Problem Relation Age of Onset  . Colon  cancer Father   . Other Mother        Perforated bowel  . Colon cancer Maternal Grandmother   . Colon cancer Maternal Grandfather   . Heart disease Maternal Uncle   . Stroke Maternal Uncle   . Cancer Brother   . Healthy Son   . Healthy Daughter   . Anesthesia problems Neg Hx   . Hypotension Neg Hx   . Malignant hyperthermia Neg Hx   . Pseudochol deficiency Neg Hx      Social History   Socioeconomic History  . Marital status: Married    Spouse name: Not on file  . Number of children: 2  . Years of education: Not on file  . Highest education level: Not on file  Occupational History  . Occupation: disabled    Employer: DISABLED  Social Needs  . Financial resource strain: Not on file  . Food insecurity:    Worry: Not on file    Inability: Not on file  . Transportation needs:    Medical: Not on file    Non-medical: Not on file  Tobacco Use  . Smoking status: Former Smoker    Packs/day: 1.00    Years: 41.00    Pack years: 41.00    Types: Cigarettes    Last attempt to quit: 06/29/2002    Years since quitting: 15.5  . Smokeless tobacco: Never Used  . Tobacco comment: quit 56yr ago  Substance and Sexual Activity  . Alcohol use: Yes    Alcohol/week: 1.0 standard drinks    Types: 1 Cans of beer per week    Comment: occasional less than one a week  . Drug use: No  . Sexual activity: Yes  Lifestyle  . Physical activity:    Days per week: Not on file    Minutes per session: Not on file  . Stress: Not on file  Relationships  . Social connections:    Talks on phone: Not on file    Gets together: Not on file    Attends religious service: Not on file    Active member of club or organization: Not on file    Attends meetings of clubs or organizations: Not on file    Relationship status: Not on file  . Intimate partner violence:    Fear of current or ex partner: Not on file    Emotionally abused: Not on file    Physically abused: Not on file    Forced sexual activity:  Not on file  Other Topics Concern  . Not on file  Social History Narrative   Lives with wife in a one story home.  Has 2 children.     Currently does not work.  On disability for wrist pain since early 2000s.  Formerly a Administrator.     BP 124/70   Pulse 71   Ht 5' 11"  (1.803 m)   Wt 163 lb (73.9 kg)   BMI 22.73 kg/m   Physical Exam:  Well appearing NAD HEENT: Unremarkable Neck:  No JVD, no thyromegally Lymphatics:  No adenopathy Back:  No CVA tenderness Lungs:  Clear with no wheezes HEART:  Regular rate rhythm, no murmurs, no rubs, no clicks Abd:  soft, positive bowel sounds, no organomegally, no rebound, no guarding Ext:  2 plus pulses, no edema, no cyanosis, no clubbing Skin:  No rashes no nodules Neuro:  CN II through XII intact, motor grossly intact  EKG - nsr  DEVICE  Normal device function.  See PaceArt for details. ILR demonstrates sinus tachy  Assess/Plan: 1. PAF - he is maintaining NSR. No change in meds. 2. Stroke - he has minimal residual and feels well. 3. Cough - like due to his ACE inhibitor. He will continue his current meds except he will stop his lisinopril. 4. HTN - I asked the patient to keep a check on his bp and if it is less than 130, no bp meds are needed. If above, then I would suggest he stat taking either coreg or amlodipine.  Mikle Bosworth.D.

## 2018-01-24 ENCOUNTER — Other Ambulatory Visit: Payer: Self-pay | Admitting: Family

## 2018-01-25 NOTE — Telephone Encounter (Signed)
Dettinger. NTBS 30 days given 12/07/17

## 2018-01-25 NOTE — Telephone Encounter (Signed)
Please call to schedule an appointment with your provider to continue getting further medication refills.

## 2018-02-01 DIAGNOSIS — K740 Hepatic fibrosis: Secondary | ICD-10-CM | POA: Diagnosis not present

## 2018-02-01 DIAGNOSIS — R7989 Other specified abnormal findings of blood chemistry: Secondary | ICD-10-CM | POA: Diagnosis not present

## 2018-02-06 ENCOUNTER — Other Ambulatory Visit: Payer: Self-pay | Admitting: Nurse Practitioner

## 2018-02-06 DIAGNOSIS — K7400 Hepatic fibrosis, unspecified: Secondary | ICD-10-CM

## 2018-02-12 ENCOUNTER — Ambulatory Visit
Admission: RE | Admit: 2018-02-12 | Discharge: 2018-02-12 | Disposition: A | Discharge status: Home / Self Care | Payer: Medicare Other | Source: Ambulatory Visit | Attending: Nurse Practitioner | Admitting: Nurse Practitioner

## 2018-02-12 DIAGNOSIS — K8020 Calculus of gallbladder without cholecystitis without obstruction: Secondary | ICD-10-CM | POA: Diagnosis not present

## 2018-02-12 DIAGNOSIS — K7400 Hepatic fibrosis, unspecified: Secondary | ICD-10-CM

## 2018-02-15 ENCOUNTER — Ambulatory Visit (INDEPENDENT_AMBULATORY_CARE_PROVIDER_SITE_OTHER): Payer: Medicare Other | Admitting: *Deleted

## 2018-02-15 DIAGNOSIS — E538 Deficiency of other specified B group vitamins: Secondary | ICD-10-CM

## 2018-02-15 NOTE — Progress Notes (Signed)
Pt giveen cyanocobalamin inj Tolerated well

## 2018-02-22 ENCOUNTER — Ambulatory Visit (INDEPENDENT_AMBULATORY_CARE_PROVIDER_SITE_OTHER): Payer: Medicare Other | Admitting: Nurse Practitioner

## 2018-02-22 ENCOUNTER — Ambulatory Visit (INDEPENDENT_AMBULATORY_CARE_PROVIDER_SITE_OTHER): Payer: Medicare Other

## 2018-02-22 ENCOUNTER — Encounter: Payer: Self-pay | Admitting: Nurse Practitioner

## 2018-02-22 VITALS — BP 138/81 | HR 69 | Temp 97.1°F | Ht 71.0 in | Wt 165.0 lb

## 2018-02-22 DIAGNOSIS — M25532 Pain in left wrist: Secondary | ICD-10-CM | POA: Diagnosis not present

## 2018-02-22 DIAGNOSIS — M778 Other enthesopathies, not elsewhere classified: Secondary | ICD-10-CM

## 2018-02-22 DIAGNOSIS — M19032 Primary osteoarthritis, left wrist: Secondary | ICD-10-CM | POA: Diagnosis not present

## 2018-02-22 NOTE — Patient Instructions (Signed)
Tendinitis Tendinitis is inflammation of a tendon. A tendon is a strong cord of tissue that connects muscle to bone. Tendinitis can affect any tendon, but it most commonly affects the shoulder tendon (rotator cuff), ankle tendon (Achilles tendon), elbow tendon (triceps tendon), or one of the tendons in the wrist. What are the causes? This condition may be caused by:  Overusing a tendon or muscle. This is common.  Age-related wear and tear.  Injury.  Inflammatory conditions, such as arthritis.  Certain medicines.  What increases the risk? This condition is more likely to develop in people who do activities that involve repetitive motions. What are the signs or symptoms? Symptoms of this condition may include:  Pain.  Tenderness.  Mild swelling.  How is this diagnosed? This condition is diagnosed with a physical exam. You may also have tests, such as:  Ultrasound. This uses sound waves to make an image of your affected area.  MRI.  How is this treated? This condition may be treated by resting, icing, applying pressure (compression), and raising (elevating) the area above the level of your heart. This is known as RICE therapy. Treatment may also include:  Medicines to help reduce inflammation or to help reduce pain.  Exercises or physical therapy to strengthen and stretch the tendon.  A brace or splint.  Surgery (rare).  Follow these instructions at home:  If you have a splint or brace:  Wear the splint or brace as told by your health care provider. Remove it only as told by your health care provider.  Loosen the splint or brace if your fingers or toes tingle, become numb, or turn cold and blue.  Do not take baths, swim, or use a hot tub until your health care provider approves. Ask your health care provider if you can take showers. You may only be allowed to take sponge baths for bathing.  Do not let your splint or brace get wet if it is not waterproof. ? If your  splint or brace is not waterproof, cover it with a watertight plastic bag when you take a bath or a shower.  Keep the splint or brace clean. Managing pain, stiffness, and swelling  If directed, apply ice to the affected area. ? Put ice in a plastic bag. ? Place a towel between your skin and the bag. ? Leave the ice on for 20 minutes, 2-3 times a day.  If directed, apply heat to the affected area as often as told by your health care provider. Use the heat source that your health care provider recommends, such as a moist heat pack or a heating pad. ? Place a towel between your skin and the heat source. ? Leave the heat on for 20-30 minutes. ? Remove the heat if your skin turns bright red. This is especially important if you are unable to feel pain, heat, or cold. You may have a greater risk of getting burned.  Move the fingers or toes of the affected limb often, if this applies. This can help to prevent stiffness and lessen swelling.  If directed, elevate the affected area above the level of your heart while you are sitting or lying down. Driving  Do not drive or operate heavy machinery while taking prescription pain medicine.  Ask your health care provider when it is safe to drive if you have a splint or brace on any part of your arm or leg. Activity  Return to your normal activities as told by your health care  provider. Ask your health care provider what activities are safe for you.  Rest the affected area as told by your health care provider.  Avoid using the affected area while you are experiencing symptoms of tendinitis.  Do exercises as told by your health care provider. General instructions  If you have a splint, do not put pressure on any part of the splint until it is fully hardened. This may take several hours.  Wear an elastic bandage or compression wrap only as told by your health care provider.  Take over-the-counter and prescription medicines only as told by your  health care provider.  Keep all follow-up visits as told by your health care provider. This is important. Contact a health care provider if:  Your symptoms do not improve.  You develop new, unexplained problems, such as numbness in your hands. This information is not intended to replace advice given to you by your health care provider. Make sure you discuss any questions you have with your health care provider. Document Released: 02/26/2000 Document Revised: 10/29/2015 Document Reviewed: 12/01/2014 Elsevier Interactive Patient Education  [employer].

## 2018-02-22 NOTE — Progress Notes (Signed)
   Subjective:    Patient ID: Cameron Roman, male    DOB: 1950/10/23, 67 y.o.   MRN: [id number]   Chief Complaint: Wrist Pain (Left)   HPI Patient comes in today c/o left wrist pain. He does not recall in injury. started hurting 3 weeks ago. He says pain started out intermittent and now is all day long. Hurts to use left hand. He has not taking any motrin or ibuprofen. He is on hydrocodone for back and neck pain. He does have dx of arthritis.   Review of Systems  Constitutional: Negative.   HENT: Negative.   Respiratory: Negative.   Cardiovascular: Negative.   Musculoskeletal: Positive for arthralgias, back pain and neck pain.  Neurological: Negative.   Psychiatric/Behavioral: Negative.   All other systems reviewed and are negative.      Objective:   Physical Exam Constitutional:      Appearance: He is obese.  Cardiovascular:     Rate and Rhythm: Normal rate.     Pulses: Normal pulses.  Pulmonary:     Effort: Pulmonary effort is normal.  Musculoskeletal:        General: No swelling or deformity.     Comments: Pain on palpation along left distal radius- no edema. Pain on medial movement of wrist. Pain with movement of thumb and with opposition of thumb.  Skin:    General: Skin is warm and dry.  Neurological:     General: No focal deficit present.     Mental Status: He is alert and oriented to person, place, and time.  Psychiatric:        Mood and Affect: Mood normal.        Behavior: Behavior normal.        Thought Content: Thought content normal.     BP 138/81   Pulse 69   Temp (!) 97.1 F (36.2 C) (Oral)   Ht 5\' 11"  (1.803 m)   Wt 165 lb (74.8 kg)   BMI 23.01 kg/m        Assessment & Plan:  [name] in today with chief complaint of Wrist Pain (Left)   1. Left wrist pain - DG Wrist Complete Left; Future  2. Tendonitis of wrist, left Thumb spica splint Continue hydrocodone as rx- no motrin due to eliquis and no tylenol due to hydrocodone. RTO  prn  Mary-Margaret Hassell Done, FNP

## 2018-02-26 ENCOUNTER — Other Ambulatory Visit: Payer: Self-pay | Admitting: Family Medicine

## 2018-03-12 ENCOUNTER — Other Ambulatory Visit: Payer: Self-pay | Admitting: Internal Medicine

## 2018-03-20 ENCOUNTER — Ambulatory Visit (INDEPENDENT_AMBULATORY_CARE_PROVIDER_SITE_OTHER): Payer: Medicare Other | Admitting: *Deleted

## 2018-03-20 ENCOUNTER — Ambulatory Visit: Payer: Medicare Other

## 2018-03-20 DIAGNOSIS — E538 Deficiency of other specified B group vitamins: Secondary | ICD-10-CM | POA: Diagnosis not present

## 2018-03-20 NOTE — Progress Notes (Signed)
Pt given Cyanocobalamin inj Tolerated well 

## 2018-03-26 ENCOUNTER — Other Ambulatory Visit: Payer: Self-pay | Admitting: Family Medicine

## 2018-03-26 NOTE — Telephone Encounter (Signed)
Last lipid 12/23/15

## 2018-04-06 ENCOUNTER — Other Ambulatory Visit: Payer: Self-pay

## 2018-04-06 NOTE — Patient Outreach (Signed)
[facility]) Care Management  04/06/2018  Wood Novacek 03/23/1950 [id number]   Medication Adherence call to Mr. Shoji Pertuit patient did not answer patient is due on Lisinopril 10 mg and Pravastatin 20 mg patient last pick up on lisinopril was on 11/10/17 and Pravastatin last pick up was on 03/30/18 for a 30 days supply. Mr. Busche is showing past due under [employer].    [facility] Direct Dial [phone]  Fax [phone] [email or web address]

## 2018-05-01 ENCOUNTER — Other Ambulatory Visit: Payer: Self-pay | Admitting: Gastroenterology

## 2018-05-01 ENCOUNTER — Other Ambulatory Visit: Payer: Self-pay | Admitting: Family Medicine

## 2018-05-01 ENCOUNTER — Ambulatory Visit (INDEPENDENT_AMBULATORY_CARE_PROVIDER_SITE_OTHER): Payer: Medicare Other | Admitting: Family Medicine

## 2018-05-01 ENCOUNTER — Encounter: Payer: Self-pay | Admitting: Family Medicine

## 2018-05-01 VITALS — BP 146/84 | HR 88 | Temp 97.9°F | Ht 71.0 in | Wt 162.0 lb

## 2018-05-01 DIAGNOSIS — M25532 Pain in left wrist: Secondary | ICD-10-CM | POA: Diagnosis not present

## 2018-05-01 DIAGNOSIS — H9313 Tinnitus, bilateral: Secondary | ICD-10-CM | POA: Insufficient documentation

## 2018-05-01 DIAGNOSIS — E538 Deficiency of other specified B group vitamins: Secondary | ICD-10-CM

## 2018-05-01 NOTE — Progress Notes (Signed)
Subjective: CC: Left wrist pain  PCP: Dettinger, [name], MD MWN:UUVOZ Mccready is a 68 y.o. male presenting to clinic today for:  1. left wrist pain Patient reports several month history of left-sided wrist pain.  He notes difficulty specifically in the left thumb where he is unable to move it much.  He had imaging studies in December which demonstrated quite a bit of degenerative change within the left wrist and what appears to be an old fracture.  He was placed in a brace and reports that he has been compliant with this for the last couple of months.  Symptoms do not seem to be improving and if anything he seems more stiff and like he is losing some range of motion.  He has a history of multiple wrist surgeries on the right wrist.  Denies any sensation changes.  2.  Tinnitus Patient reports longstanding history of tinnitus bilaterally.  He has a history of ear surgeries in the past.  He is wondering if there is a pill that might improve his symptoms.  3.  B12 deficiency Patient with history of B12 deficiency, needing his monthly B12 injection.  ROS: Per HPI  Allergies  Allergen Reactions  . Percocet [Oxycodone-Acetaminophen] Other (See Comments)    Hallucinations, inability to sleep.   Past Medical History:  Diagnosis Date  . Arthritis   . Chronic back pain    buldging disc  . Chronic neck pain    ruptured disc  . Cough    hx smoking  . Diverticulosis   . Esophageal stricture   . GERD (gastroesophageal reflux disease)    takes Prilosec daily  . H/O hiatal hernia   . Hearing loss   . Hepatitis-C 2017  . Hiatal hernia   . History of colon polyps   . Insomnia    takes Elavil nightly  . 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 Stroke [facility])     Current Outpatient Medications:  .  amitriptyline (ELAVIL) 50 MG tablet, Take 1 tablet (50 mg total) by mouth at bedtime., Disp: 30 tablet, Rfl: 3 .  apixaban (ELIQUIS) 5 MG TABS tablet, Take 1 tablet (5 mg total) by mouth 2  (two) times daily., Disp: 180 tablet, Rfl: 3 .  Cyanocobalamin (B-12) 1000 MCG/ML KIT, Inject 1 mL as directed See admin instructions. Started 09/10/15, 1 injection daily for 7 days them reduce to 1 injection once weekly, then eventually to once monthly, Disp: , Rfl:  .  fluticasone (FLONASE) 50 MCG/ACT nasal spray, Place 2 sprays into both nostrils daily., Disp: 16 g, Rfl: 6 .  gabapentin (NEURONTIN) 300 MG capsule, TAKE (1) CAPSULE THREE TIMES DAILY. (Needs to be seen before next refill), Disp: 90 capsule, Rfl: 0 .  HYDROcodone-acetaminophen (NORCO) 10-325 MG tablet, Take 1 tablet by mouth 3 (three) times daily., Disp: , Rfl:  .  Omega-3 Fatty Acids (FISH OIL) 1000 MG CAPS, Take 2,000 mg by mouth 2 (two) times daily., Disp: , Rfl:  .  omeprazole (PRILOSEC) 20 MG capsule, TAKE (1) CAPSULE DAILY, Disp: 30 capsule, Rfl: 1 .  pravastatin (PRAVACHOL) 20 MG tablet, TAKE 1 TABLET DAILY. NEED APPOINTMENT FOR MORE REFILLS, Disp: 30 tablet, Rfl: 0  Current Facility-Administered Medications:  .  0.9 %  sodium chloride infusion, 500 mL, Intravenous, Continuous, Irene Shipper, MD .  cyanocobalamin ((VITAMIN B-12)) injection 1,000 mcg, 1,000 mcg, Intramuscular, Q30 days, Timmothy Euler, MD, 1,000 mcg at 03/20/18 1423 Social History   Socioeconomic History  .  Marital status: Married    Spouse name: Not on file  . Number of children: 2  . Years of education: Not on file  . Highest education level: Not on file  Occupational History  . Occupation: disabled    Employer: DISABLED  Social Needs  . Financial resource strain: Not on file  . Food insecurity:    Worry: Not on file    Inability: Not on file  . Transportation needs:    Medical: Not on file    Non-medical: Not on file  Tobacco Use  . Smoking status: Former Smoker    Packs/day: 1.00    Years: 41.00    Pack years: 41.00    Types: Cigarettes    Last attempt to quit: 06/29/2002    Years since quitting: 15.8  . Smokeless tobacco: Never  Used  . Tobacco comment: quit 33yr ago  Substance and Sexual Activity  . Alcohol use: Yes    Alcohol/week: 1.0 standard drinks    Types: 1 Cans of beer per week    Comment: occasional less than one a week  . Drug use: No  . Sexual activity: Yes  Lifestyle  . Physical activity:    Days per week: Not on file    Minutes per session: Not on file  . Stress: Not on file  Relationships  . Social connections:    Talks on phone: Not on file    Gets together: Not on file    Attends religious service: Not on file    Active member of club or organization: Not on file    Attends meetings of clubs or organizations: Not on file    Relationship status: Not on file  . Intimate partner violence:    Fear of current or ex partner: Not on file    Emotionally abused: Not on file    Physically abused: Not on file    Forced sexual activity: Not on file  Other Topics Concern  . Not on file  Social History Narrative   Lives with wife in a one story home.  Has 2 children.     Currently does not work.  On disability for wrist pain since early 2000s.   Formerly a tAdministrator   Family History  Problem Relation Age of Onset  . Colon cancer Father   . Other Mother        Perforated bowel  . Colon cancer Maternal Grandmother   . Colon cancer Maternal Grandfather   . Heart disease Maternal Uncle   . Stroke Maternal Uncle   . Cancer Brother   . Healthy Son   . Healthy Daughter   . Anesthesia problems Neg Hx   . Hypotension Neg Hx   . Malignant hyperthermia Neg Hx   . Pseudochol deficiency Neg Hx     Objective: Office vital signs reviewed. BP (!) 146/84   Pulse 88   Temp 97.9 F (36.6 C) (Oral)   Ht 5' 11"  (1.803 m)   Wt 162 lb (73.5 kg)   BMI 22.59 kg/m   Physical Examination:  General: Awake, alert, No acute distress Extremities: warm, well perfused, No edema, cyanosis or clubbing; +2 pulses bilaterally MSK:   Left wrist: Decreased range of motion.  He has palpable bony changes in  the left MCP of the thumb.  No tenderness to palpation over the anatomic snuffbox. Skin: dry; intact; no rashes or lesions Neuro: light touch sensation grossly in tact   Assessment/ Plan: 68y.o. male  1. Left wrist pain Likely secondary to diffuse degenerative changes within the left wrist.  I do question if he would benefit from a corticosteroid injection to the MCP versus the wrist.  Will refer to orthopedics for further evaluation.  Of note, had significant changes within the right wrist in the past which required multiple surgeries in [place] years ago. - Ambulatory referral to Orthopedic Surgery  2. B12 deficiency B12 shot administered  3. Tinnitus of both ears We discussed that there are no special pills that would improve tinnitus.  We discussed potential referral to ENT though he is not amenable to reevaluation/surgical interventions at this time.   Orders Placed This Encounter  Procedures  . Ambulatory referral to Orthopedic Surgery    Referral Priority:   Routine    Referral Type:   Surgical    Referral Reason:   Specialty Services Required    Requested Specialty:   Orthopedic Surgery    Number of Visits Requested:   1   No orders of the defined types were placed in this encounter.    Janora Norlander, DO [place] [phone]

## 2018-05-01 NOTE — Patient Instructions (Signed)
I think you need a corticosteroid injection.  Dr Gladstone Lighter can see you Thursday.  He is here from 8-11am.  You will be called on Thursday morning.  Be ready to go at 8 am as your appointment MAY BE 815am.

## 2018-05-03 DIAGNOSIS — M79645 Pain in left finger(s): Secondary | ICD-10-CM | POA: Diagnosis not present

## 2018-05-03 DIAGNOSIS — M65312 Trigger thumb, left thumb: Secondary | ICD-10-CM | POA: Diagnosis not present

## 2018-05-17 ENCOUNTER — Telehealth: Payer: Self-pay | Admitting: Internal Medicine

## 2018-05-17 MED ORDER — OMEPRAZOLE 20 MG PO CPDR: 20.0000 mg | DELAYED_RELEASE_CAPSULE | Freq: Every day | ORAL | 3 refills | Status: DC

## 2018-05-17 NOTE — Telephone Encounter (Signed)
Refilled Omeprazole 

## 2018-05-17 NOTE — Telephone Encounter (Signed)
PT would like to get a refill for omeprazole (PRILOSEC) 20 MG

## 2018-05-22 ENCOUNTER — Ambulatory Visit (INDEPENDENT_AMBULATORY_CARE_PROVIDER_SITE_OTHER): Payer: Medicare Other | Admitting: *Deleted

## 2018-05-22 VITALS — BP 137/68 | HR 69 | Ht 71.0 in | Wt 156.0 lb

## 2018-05-22 DIAGNOSIS — Z0000 Encounter for general adult medical examination without abnormal findings: Secondary | ICD-10-CM

## 2018-05-22 NOTE — Patient Instructions (Signed)
Advance Directive  Advance directives are legal documents that let you make choices ahead of time about your health care and medical treatment in case you become unable to communicate for yourself. Advance directives are a way for you to communicate your wishes to family, friends, and health care providers. This can help convey your decisions about end-of-life care if you become unable to communicate. Discussing and writing advance directives should happen over time rather than all at once. Advance directives can be changed depending on your situation and what you want, even after you have signed the advance directives. If you do not have an advance directive, some states assign family decision makers to act on your behalf based on how closely you are related to them. Each state has its own laws regarding advance directives. You may want to check with your health care provider, attorney, or state representative about the laws in your state. There are different types of advance directives, such as:  Medical power of attorney.  Living will.  Do not resuscitate (DNR) or do not attempt resuscitation (DNAR) order. Health care proxy and medical power of attorney A health care proxy, also called a health care agent, is a person who is appointed to make medical decisions for you in cases in which you are unable to make the decisions yourself. Generally, people choose someone they know well and trust to represent their preferences. Make sure to ask this person for an agreement to act as your proxy. A proxy may have to exercise judgment in the event of a medical decision for which your wishes are not known. A medical power of attorney is a legal document that names your health care proxy. Depending on the laws in your state, after the document is written, it may also need to be:  Signed.  Notarized.  Dated.  Copied.  Witnessed.  Incorporated into your medical record. You may also want to appoint  someone to manage your financial affairs in a situation in which you are unable to do so. This is called a durable power of attorney for finances. It is a separate legal document from the durable power of attorney for health care. You may choose the same person or someone different from your health care proxy to act as your agent in financial matters. If you do not appoint a proxy, or if there is a concern that the proxy is not acting in your best interests, a court-appointed guardian may be designated to act on your behalf. Living will A living will is a set of instructions documenting your wishes about medical care when you cannot express them yourself. Health care providers should keep a copy of your living will in your medical record. You may want to give a copy to family members or friends. To alert caregivers in case of an emergency, you can place a card in your wallet to let them know that you have a living will and where they can find it. A living will is used if you become:  Terminally ill.  Incapacitated.  Unable to communicate or make decisions. Items to consider in your living will include:  The use or non-use of life-sustaining equipment, such as dialysis machines and breathing machines (ventilators).  A DNR or DNAR order, which is the instruction not to use cardiopulmonary resuscitation (CPR) if breathing or heartbeat stops.  The use or non-use of tube feeding.  Withholding of food and fluids.  Comfort (palliative) care when the goal becomes comfort rather  than a cure.  Organ and tissue donation. A living will does not give instructions for distributing your money and property if you should pass away. It is recommended that you seek the advice of a lawyer when writing a will. Decisions about taxes, beneficiaries, and asset distribution will be legally binding. This process can relieve your family and friends of any concerns surrounding disputes or questions that may come up about  the distribution of your assets. DNR or DNAR A DNR or DNAR order is a request not to have CPR in the event that your heart stops beating or you stop breathing. If a DNR or DNAR order has not been made and shared, a health care provider will try to help any patient whose heart has stopped or who has stopped breathing. If you plan to have surgery, talk with your health care provider about how your DNR or DNAR order will be followed if problems occur. Summary  Advance directives are the legal documents that allow you to make choices ahead of time about your health care and medical treatment in case you become unable to communicate for yourself.  The process of discussing and writing advance directives should happen over time. You can change the advance directives, even after you have signed them.  Advance directives include DNR or DNAR orders, living wills, and designating an agent as your medical power of attorney. This information is not intended to replace advice given to you by your health care provider. Make sure you discuss any questions you have with your health care provider. Document Released: 06/07/2007 Document Revised: 01/18/2016 Document Reviewed: 01/18/2016 Elsevier Interactive Patient Education  2019 [employer].  Cameron Roman , Thank you for taking time to come for your Medicare Wellness Visit. I appreciate your ongoing commitment to your health goals. Please review the following plan we discussed and let me know if I can assist you in the future.   These are the goals we discussed: Goals    . Have 3 meals a day     Eat 3 meals daily that consist of lean proteins, fruits and vegetables       This is a list of the screening recommended for you and due dates:  Health Maintenance  Topic Date Due  . Complete foot exam   12/18/1961  . Urine Protein Check  12/18/1961  . Tetanus Vaccine  03/12/2016  . Pneumonia vaccines (1 of 2 - PCV13) 12/18/2016  . Eye exam for diabetics   01/17/2017  . Hemoglobin A1C  03/02/2018  . Colon Cancer Screening  09/20/2019  . Flu Shot  Completed  .  Hepatitis C: One time screening is recommended by Center for Disease Control  (CDC) for  adults born from [birth year] through 1964.   Completed

## 2018-05-22 NOTE — Progress Notes (Addendum)
Subjective:   Cameron Roman is a 68 y.o. male who presents for an Initial Medicare Annual Wellness Visit. 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  He enjoys building birdhouse, fishing and playing with his dogs.  He lives at home with his wife Cameron Roman) of 58 years who has lung cancer.  Cameron Roman spends most of his time taking care of his wife. They have 2 children and 6 grandchildren that visits often.  He gets in his exercise by working around the house and playing with the Dogs.  Cameron Roman has had no hospitalizations or surgeries in the past and feels that his health is overall the same as it was a year ago.    Objective:    Today's Vitals   05/22/18 0825  BP: 137/68  Pulse: 69  Weight: 156 lb (70.8 kg)  Height: _0  (1.803 m)   Body mass index is 21.76 kg/m.  Advanced Directives 09/19/2016 09/14/2015 09/11/2015 04/13/2011 04/12/2011 04/08/2011  Does Patient Have a Medical Advance Directive? No No No Patient does not have advance directive;Patient would not like information - Patient does not have advance directive;Patient would not like information  Would patient like information on creating a medical advance directive? - No - patient declined information - - - -  Pre-existing out of facility DNR order (yellow form or pink MOST form) - - - - No No    Current Medications (verified) Outpatient Encounter Medications as of 05/22/2018  Medication Sig  . apixaban (ELIQUIS) 5 MG TABS tablet Take 1 tablet (5 mg total) by mouth 2 (two) times daily.  . Cyanocobalamin (B-12) 1000 MCG/ML KIT Inject 1 mL as directed See admin instructions. Started 09/10/15, 1 injection daily for 7 days them reduce to 1 injection once weekly, then eventually to once monthly  . fluticasone (FLONASE) 50 MCG/ACT nasal spray Place 2 sprays into both nostrils daily.  Marland Kitchen gabapentin (NEURONTIN) 300 MG capsule TAKE (1) CAPSULE THREE TIMES DAILY. (Needs to be seen before next refill)  . HYDROcodone-acetaminophen  (NORCO) 10-325 MG tablet Take 1 tablet by mouth 3 (three) times daily.  . Omega-3 Fatty Acids (FISH OIL) 1000 MG CAPS Take 2,000 mg by mouth 2 (two) times daily.  Marland Kitchen omeprazole (PRILOSEC) 20 MG capsule Take 1 capsule (20 mg total) by mouth daily.  . pravastatin (PRAVACHOL) 20 MG tablet TAKE 1 TABLET DAILY. NEED APPOINTMENT FOR MORE REFILLS  . [DISCONTINUED] amitriptyline (ELAVIL) 50 MG tablet Take 1 tablet (50 mg total) by mouth at bedtime. (Patient not taking: Reported on 05/22/2018)   Facility-Administered Encounter Medications as of 05/22/2018  Medication  . 0.9 %  sodium chloride infusion  . cyanocobalamin ((VITAMIN B-12)) injection 1,000 mcg    Allergies (verified) Percocet [oxycodone-acetaminophen]   History: Past Medical History:  Diagnosis Date  . Arthritis   . Chronic back pain    buldging disc  . Chronic neck pain    ruptured disc  . Cough    hx smoking  . Diverticulosis   . Esophageal stricture   . GERD (gastroesophageal reflux disease)    takes Prilosec daily  . H/O hiatal hernia   . Hearing loss   . Hepatitis-C 2017  . Hiatal hernia   . History of colon polyps   . Insomnia    takes Elavil nightly  . 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 Stroke [facility])    Past Surgical History:  Procedure Laterality Date  . ANTERIOR CERVICAL DECOMP/DISCECTOMY FUSION  04/12/2011  Procedure: ANTERIOR CERVICAL DECOMPRESSION/DISCECTOMY FUSION 2 LEVELS;  Surgeon: Peggyann Shoals, MD;  Location: [facility];  Service: Neurosurgery;  Laterality: N/A;  exploration of Cervical four - seven  fusion with redo Cervical six- seven, cervical three- four anterior cervical decompression with fusion interbody prothesis plating and bonegraft and C34 anterior cervical decompression with inte  . APPENDECTOMY     at age 65  . CARDIAC CATHETERIZATION  06/03/04  . COLONOSCOPY    . EP IMPLANTABLE DEVICE N/A 09/14/2015   Procedure: Loop Recorder Insertion;  Surgeon: Evans Lance, MD;  Location: [facility] CV LAB;  Service: Cardiovascular;  Laterality: N/A;  . HAND SURGERY Right 2005   right  . INNER EAR SURGERY Bilateral    bil;titanium in both ears  . lower back surgery  2009   had plates and screws inserted  . NECK SURGERY  2009   had insertion of  plates and screws  . TEE WITHOUT CARDIOVERSION N/A 09/14/2015   Procedure: TRANSESOPHAGEAL ECHOCARDIOGRAM (TEE);  Surgeon: Thayer Headings, MD;  Location: [facility] ENDOSCOPY;  Service: Cardiovascular;  Laterality: N/A;   Family History  Problem Relation Age of Onset  . Colon cancer Father   . Other Mother        Perforated bowel  . Colon cancer Maternal Grandmother   . Colon cancer Maternal Grandfather   . Heart disease Maternal Uncle   . Stroke Maternal Uncle   . Cancer Brother   . Healthy Son   . Healthy Daughter   . Anesthesia problems Neg Hx   . Hypotension Neg Hx   . Malignant hyperthermia Neg Hx   . Pseudochol deficiency Neg Hx    Social History   Socioeconomic History  . Marital status: Married    Spouse name: Cameron Roman  . Number of children: 2  . Years of education: 8th Grade  . Highest education level: 8th grade  Occupational History  . Occupation: disabled    Employer: DISABLED  Social Needs  . Financial resource strain: Not hard at all  . Food insecurity:    Worry: Never true    Inability: Never true  . Transportation needs:    Medical: No    Non-medical: No  Tobacco Use  . Smoking status: Former Smoker    Packs/day: 1.00    Years: 41.00    Pack years: 41.00    Types: Cigarettes    Last attempt to quit: 06/29/2002    Years since quitting: 15.9  . Smokeless tobacco: Never Used  . Tobacco comment: quit 55yr ago  Substance and Sexual Activity  . Alcohol use: Not Currently    Alcohol/week: 1.0 standard drinks    Types: 1 Cans of beer per week    Comment: occasional less than one a week  . Drug use: No  . Sexual activity: Yes  Lifestyle  . Physical activity:    Days per week: 7 days    Minutes per  session: 30 min  . Stress: Not at all  Relationships  . Social connections:    Talks on phone: More than three times a week    Gets together: More than three times a week    Attends religious service: Never    Active member of club or organization: No    Attends meetings of clubs or organizations: Never    Relationship status: Married  Other Topics Concern  . Not on file  Social History Narrative   Lives with wife in a one  story home.  Has 2 children.     Currently does not work.  On disability for wrist pain since early 2000s.   Formerly a Administrator.   Tobacco Counseling Counseling given: Not Answered Comment: quit 46yr ago   Clinical Intake:  Pre-visit preparation completed: Yes  Pain : No/denies pain     BMI - recorded: 21.76 Nutritional Status: BMI of 19-24  Normal Diabetes: No  How often do you need to have someone help you when you read instructions, pamphlets, or other written materials from your doctor or pharmacy?: 1 - Never What is the last grade level you completed in school?: 8th Grade  Interpreter Needed?: No  Information entered by :: CTruett Mainland LPN  Activities of Daily Living In your present state of health, do you have any difficulty performing the following activities: 05/22/2018  Hearing? N  Vision? N  Difficulty concentrating or making decisions? N  Walking or climbing stairs? N  Dressing or bathing? N  Doing errands, shopping? N  Some recent data might be hidden     Immunizations and Health Maintenance Immunization History  Administered Date(s) Administered  . Influenza, High Dose Seasonal PF 01/10/2018  . Influenza,inj,Quad PF,6+ Mos 12/11/2012, 01/21/2014, 02/17/2015, 12/23/2015, 01/06/2017  . Zoster 03/12/2013   Health Maintenance Due  Topic Date Due  . FOOT EXAM  12/18/1961  . URINE MICROALBUMIN  12/18/1961  . TETANUS/TDAP  03/12/2016  . PNA vac Low Risk Adult (1 of 2 - PCV13) 12/18/2016  . OPHTHALMOLOGY EXAM  01/17/2017    . HEMOGLOBIN A1C  03/02/2018  Patient declined Tdap, PNA vac and shingrix vaccine due to price.  Patient Care Team: Dettinger, JFransisca Kaufmann MD as PCP - General (Family Medicine)  Indicate any recent Medical Services you may have received from other than [facility] providers in the past year (date may be approximate).    Assessment:   This is a routine wellness examination for Cameron Roman.  Hearing/Vision screen No exam data present  Dietary issues and exercise activities discussed: Current Exercise Habits: Home exercise routine, Type of exercise: walking, Time (Minutes): 60, Frequency (Times/Week): 7, Weekly Exercise (Minutes/Week): 420, Intensity: Mild  Goals    . Have 3 meals a day     Eat 3 meals daily that consist of lean proteins, fruits and vegetables      Depression Screen PHQ 2/9 Scores 05/22/2018 05/01/2018 02/22/2018 08/31/2017  PHQ - 2 Score 0 0 0 0  PHQ- 9 Score - 0 - -    Fall Risk Fall Risk  05/22/2018 05/01/2018 02/22/2018 08/31/2017 08/25/2016  Falls in the past year? 0 0 0 No No  Number falls in past yr: - - - - -  Injury with Fall? - - - - -  Risk for fall due to : - - - - -  Follow up - - - - -    Is the patient's home free of loose throw rugs in walkways, pet beds, electrical cords, etc?   yes      Grab bars in the bathroom? yes      Handrails on the stairs?   yes      Adequate lighting?   yes  Timed Get Up and Go performed:   Cognitive Function: MMSE - Mini Mental State Exam 05/22/2018  Not completed: (No Data)  Orientation to time 4  Orientation to Place 5  Registration 3  Attention/ Calculation 0  Recall 3  Language- name 2 objects 2  Language- repeat 1  Language- follow 3 step command 3  Language- read & follow direction 0  Write a sentence 0  Copy design 0  Total score 21  Patient has difficulties with reading and writing due to lack of education      Screening Tests Health Maintenance  Topic Date Due  . FOOT EXAM  12/18/1961  . URINE MICROALBUMIN   12/18/1961  . TETANUS/TDAP  03/12/2016  . PNA vac Low Risk Adult (1 of 2 - PCV13) 12/18/2016  . OPHTHALMOLOGY EXAM  01/17/2017  . HEMOGLOBIN A1C  03/02/2018  . COLONOSCOPY  09/20/2019  . INFLUENZA VACCINE  Completed  . Hepatitis C Screening  Completed    Qualifies for Shingles Vaccine? Patient declined due to pricing  Cancer Screenings: Lung: Low Dose CT Chest recommended if Age 41-80 years, 30 pack-year currently smoking OR have quit w/in 15years. Patient does not qualify. Colorectal: Up to date  Additional Screenings:  Hepatitis C Screening:       Plan:  Encouraged Cameron Roman to eat 3 meals daily that consist of lean proteins, fruits and vegetables.  Encouraged to try to exercise for at least 30 minutes, 3 times weekly.  Explained the importance of Tdap, prevnar and shingrix.  Patient declined these vaccines.  Advance directive packet given and encouraged to discuss with patient's wife.  Appointment made to establish care with Dr. Warrick Parisian and encouraged to keep appointment.   I have personally reviewed and noted the following in the patient's chart:   . Medical and social history . Use of alcohol, tobacco or illicit drugs  . Current medications and supplements . Functional ability and status . Nutritional status . Physical activity . Advanced directives . List of other physicians . Hospitalizations, surgeries, and ER visits in previous 12 months . Vitals . Screenings to include cognitive, depression, and falls . Referrals and appointments  In addition, I have reviewed and discussed with patient certain preventive protocols, quality metrics, and best practice recommendations. A written personalized care plan for preventive services as well as general preventive health recommendations were provided to patient.     Wardell Heath, LPN   3/34/3568     I have reviewed and agree with the above AWV documentation.   Evelina Dun, FNP

## 2018-06-06 ENCOUNTER — Ambulatory Visit: Payer: Medicare Other | Admitting: Family Medicine

## 2018-06-07 ENCOUNTER — Other Ambulatory Visit: Payer: Self-pay

## 2018-06-07 NOTE — Patient Outreach (Signed)
[facility]) Care Management  06/07/2018  Cameron Roman 11/20/1950 [id number]   Medication Adherence call to Cameron Roman left a message for patient to call back patient is showing past due on Pravastatin 20 mg patient's wife was not sure if patient is still taking this medication. [name] is showing due under [place].   [place] Management Direct Dial [phone]  Fax [phone] [email or web address]

## 2018-06-08 ENCOUNTER — Ambulatory Visit (INDEPENDENT_AMBULATORY_CARE_PROVIDER_SITE_OTHER): Payer: Medicare Other | Admitting: Family Medicine

## 2018-06-08 ENCOUNTER — Telehealth: Payer: Self-pay | Admitting: *Deleted

## 2018-06-08 ENCOUNTER — Encounter: Payer: Self-pay | Admitting: Family Medicine

## 2018-06-08 ENCOUNTER — Other Ambulatory Visit: Payer: Self-pay

## 2018-06-08 ENCOUNTER — Other Ambulatory Visit: Payer: Self-pay | Admitting: Family Medicine

## 2018-06-08 DIAGNOSIS — I480 Paroxysmal atrial fibrillation: Secondary | ICD-10-CM | POA: Diagnosis not present

## 2018-06-08 DIAGNOSIS — E782 Mixed hyperlipidemia: Secondary | ICD-10-CM | POA: Diagnosis not present

## 2018-06-08 DIAGNOSIS — K219 Gastro-esophageal reflux disease without esophagitis: Secondary | ICD-10-CM

## 2018-06-08 DIAGNOSIS — I10 Essential (primary) hypertension: Secondary | ICD-10-CM | POA: Diagnosis not present

## 2018-06-08 DIAGNOSIS — R7303 Prediabetes: Secondary | ICD-10-CM

## 2018-06-08 MED ORDER — APIXABAN 5 MG PO TABS: 5.0000 mg | ORAL_TABLET | Freq: Two times a day (BID) | ORAL | 3 refills | Status: DC

## 2018-06-08 MED ORDER — GABAPENTIN 300 MG PO CAPS: 300.0000 mg | ORAL_CAPSULE | Freq: Two times a day (BID) | ORAL | 3 refills | Status: DC

## 2018-06-08 MED ORDER — PRAVASTATIN SODIUM 20 MG PO TABS: 20.0000 mg | ORAL_TABLET | Freq: Every day | ORAL | 3 refills | Status: DC

## 2018-06-08 NOTE — Patient Outreach (Signed)
[place] [facility]) Care Management  06/08/2018  Cameron Roman [birth year] [id number]  Medication Adherence call back from [name] patient explain he is due on Pravastatin 20 mg and does not have any medication at this time patient did leave a message at doctor's office but has not received a call back from the doctor office.call doctors office they said patient has not been seen since 2018 and can not fill the prescription until they see him again they will get in touch with patient.Cameron Roman is showing past due under [place].   [place] Management Direct Dial [phone]  Fax [phone] [email or web address]

## 2018-06-08 NOTE — Telephone Encounter (Signed)
Pt [employer] patient assistance has been denied because product is covered by insurance.

## 2018-06-08 NOTE — Progress Notes (Signed)
Virtual Visit via telephone Note  I connected with Cameron Roman on 06/08/18 at 1535 by telephone and verified that I am speaking with the correct person using two identifiers. Cameron Roman is currently located at home and no other people are currently with her during visit. The provider, [name] Nakeita Styles, MD is located in their office at time of visit.  Call ended at 1546  I discussed the limitations, risks, security and privacy concerns of performing an evaluation and management service by telephone and the availability of in person appointments. I also discussed with the patient that there may be a patient responsible charge related to this service. The patient expressed understanding and agreed to proceed.   History and Present Illness: Hypertension Patient is currently on no medication, and their blood pressure today is 138-126/80s at home. Patient denies any lightheadedness or dizziness. Patient denies headaches, blurred vision, chest pains, shortness of breath, or weakness. Denies any side effects from medication and is content with current medication.   Hyperlipidemia Patient is coming in for recheck of his hyperlipidemia. The patient is currently taking pravastatin. They deny any issues with myalgias or history of liver damage from it. They deny any focal numbness or weakness or chest pain.   GERD Patient is currently on omeprazole.  She denies any major symptoms or abdominal pain or belching or burping. She denies any blood in her stool or lightheadedness or dizziness.   Prediabetes Patient comes in today for recheck of his diabetes. Patient has been currently taking no medication. Patient is not currently on an ACE inhibitor/ARB. Patient has not seen an ophthalmologist this year. Patient denies any issues with their feet.   No diagnosis found.  Outpatient Encounter Medications as of 06/08/2018  Medication Sig  . apixaban (ELIQUIS) 5 MG TABS tablet Take 1 tablet (5 mg total)  by mouth 2 (two) times daily.  . Cyanocobalamin (B-12) 1000 MCG/ML KIT Inject 1 mL as directed See admin instructions. Started 09/10/15, 1 injection daily for 7 days them reduce to 1 injection once weekly, then eventually to once monthly  . fluticasone (FLONASE) 50 MCG/ACT nasal spray Place 2 sprays into both nostrils daily.  Marland Kitchen gabapentin (NEURONTIN) 300 MG capsule TAKE (1) CAPSULE THREE TIMES DAILY. (Needs to be seen before next refill)  . HYDROcodone-acetaminophen (NORCO) 10-325 MG tablet Take 1 tablet by mouth 3 (three) times daily.  . Omega-3 Fatty Acids (FISH OIL) 1000 MG CAPS Take 2,000 mg by mouth 2 (two) times daily.  Marland Kitchen omeprazole (PRILOSEC) 20 MG capsule Take 1 capsule (20 mg total) by mouth daily.  . pravastatin (PRAVACHOL) 20 MG tablet TAKE 1 TABLET DAILY. NEED APPOINTMENT FOR MORE REFILLS   Facility-Administered Encounter Medications as of 06/08/2018  Medication  . 0.9 %  sodium chloride infusion  . cyanocobalamin ((VITAMIN B-12)) injection 1,000 mcg    Review of Systems  Constitutional: Negative for chills and fever.  Eyes: Negative for visual disturbance.  Respiratory: Negative for shortness of breath and wheezing.   Cardiovascular: Negative for chest pain and leg swelling.  Gastrointestinal: Negative for abdominal pain and nausea.  Musculoskeletal: Negative for back pain and gait problem.  Skin: Negative for rash.  Neurological: Negative for dizziness, weakness and light-headedness.  All other systems reviewed and are negative.   Observations/Objective: Sounds comfortable and in no distress  Assessment and Plan: Problem List Items Addressed This Visit      Cardiovascular and Mediastinum   HTN (hypertension)   Relevant Medications   pravastatin (  PRAVACHOL) 20 MG tablet   apixaban (ELIQUIS) 5 MG TABS tablet   Paroxysmal atrial fibrillation (HCC)   Relevant Medications   pravastatin (PRAVACHOL) 20 MG tablet   apixaban (ELIQUIS) 5 MG TABS tablet     Digestive    GERD     Other   Prediabetes - Primary   HLD (hyperlipidemia)   Relevant Medications   pravastatin (PRAVACHOL) 20 MG tablet   apixaban (ELIQUIS) 5 MG TABS tablet       Follow Up Instructions:  Continue current medications Follow up in 6 months   I discussed the assessment and treatment plan with the patient. The patient was provided an opportunity to ask questions and all were answered. The patient agreed with the plan and demonstrated an understanding of the instructions.   The patient was advised to call back or seek an in-person evaluation if the symptoms worsen or if the condition fails to improve as anticipated.  The above assessment and management plan was discussed with the patient. The patient verbalized understanding of and has agreed to the management plan. Patient is aware to call the clinic if symptoms persist or worsen. Patient is aware when to return to the clinic for a follow-up visit. Patient educated on when it is appropriate to go to the emergency department.    I provided 11 minutes of non-face-to-face time during this encounter.    Worthy Rancher, MD

## 2018-06-11 ENCOUNTER — Telehealth: Payer: Self-pay | Admitting: Internal Medicine

## 2018-06-11 NOTE — Telephone Encounter (Signed)
Spoke with pt who states he is currently out of Eliquis. Informed pt that patient assistance is still processing through BMS. Informed pt that I will check and see if samples are available.  Patient voiced understanding.

## 2018-06-11 NOTE — Telephone Encounter (Signed)
Pt called stating he's been out of his Eliquis for a few days, please give pt a call @ [phone]

## 2018-06-12 NOTE — Telephone Encounter (Signed)
Pt notified that samples are ready for pick up in the [place] office.

## 2018-06-26 ENCOUNTER — Other Ambulatory Visit: Payer: Self-pay | Admitting: Internal Medicine

## 2018-06-26 NOTE — Telephone Encounter (Signed)
Spoke with [employer]MS patient assistance. They state in order to be approved pt has to spend $358.06 out of pocket.  Pt notified and thankful of attempt. Pt then ended call.

## 2018-06-26 NOTE — Telephone Encounter (Signed)
Patient will be running out of apixaban (ELIQUIS) 5 MG TABS tablet  Stated that he was given medication last time at the [facility] location.  Also stated that he was told two weeks ago that he had been approved but still has not received any medication

## 2018-08-02 DIAGNOSIS — K7469 Other cirrhosis of liver: Secondary | ICD-10-CM | POA: Diagnosis not present

## 2018-08-10 ENCOUNTER — Other Ambulatory Visit: Payer: Self-pay | Admitting: Nurse Practitioner

## 2018-08-10 DIAGNOSIS — K7469 Other cirrhosis of liver: Secondary | ICD-10-CM

## 2018-08-31 ENCOUNTER — Ambulatory Visit
Admission: RE | Admit: 2018-08-31 | Discharge: 2018-08-31 | Disposition: A | Discharge status: Home / Self Care | Payer: Medicare Other | Source: Ambulatory Visit | Attending: Nurse Practitioner | Admitting: Nurse Practitioner

## 2018-08-31 DIAGNOSIS — K7469 Other cirrhosis of liver: Secondary | ICD-10-CM

## 2018-08-31 DIAGNOSIS — K8020 Calculus of gallbladder without cholecystitis without obstruction: Secondary | ICD-10-CM | POA: Diagnosis not present

## 2018-09-17 ENCOUNTER — Other Ambulatory Visit: Payer: Self-pay | Admitting: Internal Medicine

## 2018-11-08 ENCOUNTER — Other Ambulatory Visit: Payer: Self-pay

## 2018-11-08 ENCOUNTER — Encounter: Payer: Self-pay | Admitting: Family Medicine

## 2018-11-08 ENCOUNTER — Ambulatory Visit (INDEPENDENT_AMBULATORY_CARE_PROVIDER_SITE_OTHER): Payer: Medicare Other | Admitting: Family Medicine

## 2018-11-08 VITALS — BP 131/75 | HR 68 | Temp 99.1°F | Ht 71.0 in | Wt 171.2 lb

## 2018-11-08 DIAGNOSIS — M25532 Pain in left wrist: Secondary | ICD-10-CM | POA: Diagnosis not present

## 2018-11-08 DIAGNOSIS — Z8781 Personal history of (healed) traumatic fracture: Secondary | ICD-10-CM | POA: Diagnosis not present

## 2018-11-08 DIAGNOSIS — N529 Male erectile dysfunction, unspecified: Secondary | ICD-10-CM | POA: Diagnosis not present

## 2018-11-08 MED ORDER — SILDENAFIL CITRATE 20 MG PO TABS: 20.0000 mg | ORAL_TABLET | ORAL | 1 refills | Status: DC | PRN

## 2018-11-08 NOTE — Progress Notes (Signed)
BP 131/75   Pulse 68   Temp 99.1 F (37.3 C) (Temporal)   Ht 5' 11"  (1.803 m)   Wt 171 lb 3.2 oz (77.7 kg)   BMI 23.88 kg/m    Subjective:   Patient ID: Cameron Roman, male    DOB: Jun 10, 1950, 68 y.o.   MRN: [id number]  HPI: Cameron Roman is a 68 y.o. male presenting on 11/08/2018 for Wrist Pain (Left. Patient states it has been going on for 5-6 months)   HPI Left wrist pain Patient has had left wrist pain for the past 5 or 6 months, he had an x-ray that showed possible fracture then but there is nothing that he needed do about it but more recently over the past couple weeks is been bothering him a lot more and getting worse.  He is also been having some problems with his right elbow as well that will be stiff in the mornings.  Patient has erectile dysfunction more recently over the past couple weeks and has not been working at all despite working previously without any issues and is wondering if something can be done to help with that.  He denies any penile pain or discharge.  Relevant past medical, surgical, family and social history reviewed and updated as indicated. Interim medical history since our last visit reviewed. Allergies and medications reviewed and updated.  Review of Systems  Constitutional: Negative for chills and fever.  Respiratory: Negative for shortness of breath and wheezing.   Cardiovascular: Negative for chest pain and leg swelling.  Musculoskeletal: Positive for arthralgias. Negative for back pain and gait problem.  Skin: Negative for color change and rash.  Neurological: Negative for dizziness, weakness and light-headedness.  All other systems reviewed and are negative.   Per HPI unless specifically indicated above   Allergies as of 11/08/2018      Reactions   Percocet [oxycodone-acetaminophen] Other (See Comments)   Hallucinations, inability to sleep.      Medication List       Accurate as of November 08, 2018  2:55 PM. If you have any questions,  ask your nurse or doctor.        apixaban 5 MG Tabs tablet Commonly known as: ELIQUIS Take 1 tablet (5 mg total) by mouth 2 (two) times daily.   B-12 1000 MCG/ML Kit Inject 1 mL as directed See admin instructions. Started 09/10/15, 1 injection daily for 7 days them reduce to 1 injection once weekly, then eventually to once monthly   Fish Oil 1000 MG Caps Take 2,000 mg by mouth 2 (two) times daily.   fluticasone 50 MCG/ACT nasal spray Commonly known as: FLONASE Place 2 sprays into both nostrils daily.   gabapentin 300 MG capsule Commonly known as: NEURONTIN Take 1 capsule (300 mg total) by mouth 2 (two) times daily. TAKE (1) CAPSULE THREE TIMES DAILY. (Needs to be seen before next refill)   HYDROcodone-acetaminophen 10-325 MG tablet Commonly known as: NORCO Take 1 tablet by mouth 3 (three) times daily.   omeprazole 20 MG capsule Commonly known as: PRILOSEC Take 1 capsule (20 mg total) by mouth daily. Patient needs office visit for further refills   pravastatin 20 MG tablet Commonly known as: PRAVACHOL Take 1 tablet (20 mg total) by mouth daily.        Objective:   BP 131/75   Pulse 68   Temp 99.1 F (37.3 C) (Temporal)   Ht 5' 11"  (1.803 m)   Wt 171 lb 3.2 oz (77.7  kg)   BMI 23.88 kg/m   Wt Readings from Last 3 Encounters:  11/08/18 171 lb 3.2 oz (77.7 kg)  05/22/18 156 lb (70.8 kg)  05/01/18 162 lb (73.5 kg)    Physical Exam Vitals signs and nursing note reviewed.  Constitutional:      General: He is not in acute distress.    Appearance: He is well-developed. He is not diaphoretic.  Eyes:     General: No scleral icterus.    Conjunctiva/sclera: Conjunctivae normal.  Neck:     Thyroid: No thyromegaly.  Musculoskeletal: Normal range of motion.     Left wrist: He exhibits tenderness (Lateral posterior tenderness, pain with all range of motion). He exhibits normal range of motion and no bony tenderness.  Skin:    General: Skin is warm and dry.      Findings: No rash.  Neurological:     Mental Status: He is alert and oriented to person, place, and time.     Coordination: Coordination normal.  Psychiatric:        Behavior: Behavior normal.       Assessment & Plan:   Problem List Items Addressed This Visit      Other   Erectile dysfunction   Relevant Medications   sildenafil (REVATIO) 20 MG tablet    Other Visit Diagnoses    Left wrist pain    -  Primary   Relevant Orders   Ambulatory referral to Orthopedic Surgery   History of fracture of wrist       Relevant Orders   Ambulatory referral to Orthopedic Surgery      Come back in the next month or 2 for a recheck of chronic medical conditions Follow up plan: Return if symptoms worsen or fail to improve, for 1-2 months medical check.  Counseling provided for all of the vaccine components No orders of the defined types were placed in this encounter.   Cameron Pina, MD [facility] 11/08/2018, 2:55 PM

## 2018-11-28 DIAGNOSIS — M654 Radial styloid tenosynovitis [de Quervain]: Secondary | ICD-10-CM | POA: Diagnosis not present

## 2018-12-15 ENCOUNTER — Other Ambulatory Visit: Payer: Self-pay | Admitting: Internal Medicine

## 2018-12-17 ENCOUNTER — Telehealth: Payer: Self-pay | Admitting: Internal Medicine

## 2018-12-17 MED ORDER — OMEPRAZOLE 20 MG PO CPDR: 20.0000 mg | DELAYED_RELEASE_CAPSULE | Freq: Every day | ORAL | 2 refills | Status: DC

## 2018-12-17 NOTE — Telephone Encounter (Signed)
Pt is scheduled 11/20 appt and requested a refill for omeprazole.

## 2018-12-17 NOTE — Telephone Encounter (Signed)
Refilled Omeprazole 

## 2019-01-01 ENCOUNTER — Telehealth: Payer: Self-pay | Admitting: Internal Medicine

## 2019-01-01 NOTE — Telephone Encounter (Signed)
Pt is unable to afford his apixaban (ELIQUIS) 5 MG TABS tablet [[id number]]  For this month - it's going to be $75 a month.

## 2019-01-02 NOTE — Telephone Encounter (Signed)
Called pt. No answer, left message for him to return call.

## 2019-01-03 ENCOUNTER — Other Ambulatory Visit: Payer: Self-pay

## 2019-01-03 ENCOUNTER — Encounter: Payer: Self-pay | Admitting: Internal Medicine

## 2019-01-03 ENCOUNTER — Ambulatory Visit: Payer: Medicare Other | Admitting: Internal Medicine

## 2019-01-03 VITALS — BP 117/78 | HR 75 | Temp 97.0°F | Ht 70.0 in | Wt 169.0 lb

## 2019-01-03 DIAGNOSIS — I480 Paroxysmal atrial fibrillation: Secondary | ICD-10-CM

## 2019-01-03 LAB — CUP PACEART INCLINIC DEVICE CHECK
Date Time Interrogation Session: 20201022172507
Implantable Pulse Generator Implant Date: 20170703

## 2019-01-03 NOTE — Patient Instructions (Signed)
Medication Instructions:  Your physician recommends that you continue on your current medications as directed. Please refer to the Current Medication list given to you today.  *If you need a refill on your cardiac medications before your next appointment, please call your pharmacy*  Lab Work: NONE  If you have labs (blood work) drawn today and your tests are completely normal, you will receive your results only by: Marland Kitchen MyChart Message (if you have MyChart) OR . A paper copy in the mail If you have any lab test that is abnormal or we need to change your treatment, we will call you to review the results.  Testing/Procedures: Schedule Loop Recorder Removal at [facility].   Follow-Up: At [facility], you and your health needs are our priority.  As part of our continuing mission to provide you with exceptional heart care, we have created designated Provider Care Teams.  These Care Teams include your primary Cardiologist (physician) and Advanced Practice Providers (APPs -  Physician Assistants and Nurse Practitioners) who all work together to provide you with the care you need, when you need it.  Your next appointment:   At [facility]   The format for your next appointment:   In Person  Provider:   [name], MD  Other Instructions Thank you for choosing [facility]!

## 2019-01-03 NOTE — Progress Notes (Signed)
HPI [name] today for ongoing evaluation and management of a cryptogenic stroke. In the interim, his implantable loop recorder has demonstrated paroxysmal atrial fibrillation. He is asymptomatic. He was also found to have pauses during the daytime and on no meds of up to over 4 seconds. He was asymptomatic. He denies chest pain, shortness of breath, syncope, or peripheral edema. His only complaint today is a cough. No other complaints. His ILR has reached end of service. Allergies  Allergen Reactions  . Percocet [Oxycodone-Acetaminophen] Other (See Comments)    Hallucinations, inability to sleep.     Current Outpatient Medications  Medication Sig Dispense Refill  . apixaban (ELIQUIS) 5 MG TABS tablet Take 1 tablet (5 mg total) by mouth 2 (two) times daily. 180 tablet 3  . Cyanocobalamin (B-12) 1000 MCG/ML KIT Inject 1 mL as directed See admin instructions. Started 09/10/15, 1 injection daily for 7 days them reduce to 1 injection once weekly, then eventually to once monthly    . fluticasone (FLONASE) 50 MCG/ACT nasal spray Place 2 sprays into both nostrils daily. 16 g 6  . gabapentin (NEURONTIN) 300 MG capsule Take 1 capsule (300 mg total) by mouth 2 (two) times daily. TAKE (1) CAPSULE THREE TIMES DAILY. (Needs to be seen before next refill) 180 capsule 3  . HYDROcodone-acetaminophen (NORCO) 10-325 MG tablet Take 1 tablet by mouth 3 (three) times daily.    . Omega-3 Fatty Acids (FISH OIL) 1000 MG CAPS Take 2,000 mg by mouth 2 (two) times daily.    Marland Kitchen omeprazole (PRILOSEC) 20 MG capsule Take 1 capsule (20 mg total) by mouth daily. 30 capsule 2  . pravastatin (PRAVACHOL) 20 MG tablet Take 1 tablet (20 mg total) by mouth daily. 90 tablet 3  . sildenafil (REVATIO) 20 MG tablet Take 1-3 tablets (20-60 mg total) by mouth as needed. 20 tablet 1   Current Facility-Administered Medications  Medication Dose Route Frequency Provider Last Rate Last Dose  . 0.9 %  sodium chloride infusion   500 mL Intravenous Continuous Irene Shipper, MD      . cyanocobalamin ((VITAMIN B-12)) injection 1,000 mcg  1,000 mcg Intramuscular Q30 days Timmothy Euler, MD   1,000 mcg at 05/01/18 1108     Past Medical History:  Diagnosis Date  . Arthritis   . Chronic back pain    buldging disc  . Chronic neck pain    ruptured disc  . Cough    hx smoking  . Diverticulosis   . Esophageal stricture   . GERD (gastroesophageal reflux disease)    takes Prilosec daily  . H/O hiatal hernia   . Hearing loss   . Hepatitis-C 2017  . Hiatal hernia   . History of colon polyps   . Insomnia    takes Elavil nightly  . 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 Stroke ([place])     ROS:   All systems reviewed and negative except as noted in the HPI.   Past Surgical History:  Procedure Laterality Date  . ANTERIOR CERVICAL DECOMP/DISCECTOMY FUSION  04/12/2011   Procedure: ANTERIOR CERVICAL DECOMPRESSION/DISCECTOMY FUSION 2 LEVELS;  Surgeon: Peggyann Shoals, MD;  Location: [facility];  Service: Neurosurgery;  Laterality: N/A;  exploration of Cervical four - seven  fusion with redo Cervical six- seven, cervical three- four anterior cervical decompression with fusion interbody prothesis plating and bonegraft and C34 anterior cervical decompression with inte  . APPENDECTOMY     at age 78  .  CARDIAC CATHETERIZATION  06/03/04  . COLONOSCOPY    . EP IMPLANTABLE DEVICE N/A 09/14/2015   Procedure: Loop Recorder Insertion;  Surgeon: Evans Lance, MD;  Location: [facility];  Service: Cardiovascular;  Laterality: N/A;  . HAND SURGERY Right 2005   right  . INNER EAR SURGERY Bilateral    bil;titanium in both ears  . lower back surgery  2009   had plates and screws inserted  . NECK SURGERY  2009   had insertion of  plates and screws  . TEE WITHOUT CARDIOVERSION N/A 09/14/2015   Procedure: TRANSESOPHAGEAL ECHOCARDIOGRAM (TEE);  Surgeon: Thayer Headings, MD;  Location: [facility];  Service: Cardiovascular;   Laterality: N/A;     Family History  Problem Relation Age of Onset  . Colon cancer Father   . Other Mother        Perforated bowel  . Colon cancer Maternal Grandmother   . Colon cancer Maternal Grandfather   . Heart disease Maternal Uncle   . Stroke Maternal Uncle   . Cancer Brother   . Healthy Son   . Healthy Daughter   . Anesthesia problems Neg Hx   . Hypotension Neg Hx   . Malignant hyperthermia Neg Hx   . Pseudochol deficiency Neg Hx      Social History   Socioeconomic History  . Marital status: Married    Spouse name: Pamala Hurry  . Number of children: 2  . Years of education: 8th Grade  . Highest education level: 8th grade  Occupational History  . Occupation: disabled    Employer: DISABLED  Social Needs  . Financial resource strain: Not hard at all  . Food insecurity    Worry: Never true    Inability: Never true  . Transportation needs    Medical: No    Non-medical: No  Tobacco Use  . Smoking status: Former Smoker    Packs/day: 1.00    Years: 41.00    Pack years: 41.00    Types: Cigarettes    Quit date: 06/29/2002    Years since quitting: 16.5  . Smokeless tobacco: Never Used  . Tobacco comment: quit 11yr ago  Substance and Sexual Activity  . Alcohol use: Not Currently    Alcohol/week: 1.0 standard drinks    Types: 1 Cans of beer per week    Comment: occasional less than one a week  . Drug use: No  . Sexual activity: Yes  Lifestyle  . Physical activity    Days per week: 7 days    Minutes per session: 30 min  . Stress: Not at all  Relationships  . Social connections    Talks on phone: More than three times a week    Gets together: More than three times a week    Attends religious service: Never    Active member of club or organization: No    Attends meetings of clubs or organizations: Never    Relationship status: Married  . Intimate partner violence    Fear of current or ex partner: Not on file    Emotionally abused: No    Physically  abused: No    Forced sexual activity: No  Other Topics Concern  . Not on file  Social History Narrative   Lives with wife in a one story home.  Has 2 children.     Currently does not work.  On disability for wrist pain since early 2000s.   Formerly a tAdministrator  BP 117/78   Pulse 75   Temp (!) 97 F (36.1 C)   Ht 5' 10"  (1.778 m)   Wt 169 lb (76.7 kg)   SpO2 94%   BMI 24.25 kg/m   Physical Exam:  Well appearing 68 yo man, NAD HEENT: Unremarkable Neck:  6 cm JVD, no thyromegally Lymphatics:  No adenopathy Back:  No CVA tenderness Lungs:  Clear with no wheezes HEART:  Regular rate rhythm, no murmurs, no rubs, no clicks Abd:  soft, positive bowel sounds, no organomegally, no rebound, no guarding Ext:  2 plus pulses, no edema, no cyanosis, no clubbing Skin:  No rashes no nodules Neuro:  CN II through XII intact, motor grossly intact  DEVICE  Normal device function.  See PaceArt for details. Pauses of up to 4.2 seconds  Assess/Plan: 1. Cryptogenic stroke - he is s/p ILR and found to have PAF and is maintaining NSR on Eliquis. 2. PAF - he is minimally symptomatic. We will follow. 3. Transient CHB - he has had pauses and is asymptomatic. He will undergo watchful waiting. I discussed the symptoms he might experience if he were to have prolonged bradycardia/asystole. 4. ILR - his device has reached end of service. We discussed removal of the device. He will schedule.  Mikle Bosworth.D

## 2019-01-08 DIAGNOSIS — H5203 Hypermetropia, bilateral: Secondary | ICD-10-CM | POA: Diagnosis not present

## 2019-01-16 DIAGNOSIS — K7469 Other cirrhosis of liver: Secondary | ICD-10-CM | POA: Diagnosis not present

## 2019-01-17 ENCOUNTER — Other Ambulatory Visit: Payer: Self-pay

## 2019-01-18 ENCOUNTER — Ambulatory Visit (INDEPENDENT_AMBULATORY_CARE_PROVIDER_SITE_OTHER): Payer: Medicare Other | Admitting: *Deleted

## 2019-01-18 DIAGNOSIS — Z23 Encounter for immunization: Secondary | ICD-10-CM | POA: Diagnosis not present

## 2019-01-21 ENCOUNTER — Other Ambulatory Visit: Payer: Self-pay | Admitting: Nurse Practitioner

## 2019-01-21 ENCOUNTER — Telehealth: Payer: Self-pay | Admitting: Family Medicine

## 2019-01-21 DIAGNOSIS — K7469 Other cirrhosis of liver: Secondary | ICD-10-CM

## 2019-01-21 DIAGNOSIS — R030 Elevated blood-pressure reading, without diagnosis of hypertension: Secondary | ICD-10-CM | POA: Diagnosis not present

## 2019-01-21 DIAGNOSIS — M545 Low back pain: Secondary | ICD-10-CM | POA: Diagnosis not present

## 2019-01-21 DIAGNOSIS — M5416 Radiculopathy, lumbar region: Secondary | ICD-10-CM | POA: Diagnosis not present

## 2019-01-21 DIAGNOSIS — M542 Cervicalgia: Secondary | ICD-10-CM | POA: Diagnosis not present

## 2019-01-21 DIAGNOSIS — M47816 Spondylosis without myelopathy or radiculopathy, lumbar region: Secondary | ICD-10-CM | POA: Diagnosis not present

## 2019-01-22 ENCOUNTER — Other Ambulatory Visit: Payer: Self-pay

## 2019-01-22 ENCOUNTER — Ambulatory Visit (INDEPENDENT_AMBULATORY_CARE_PROVIDER_SITE_OTHER): Payer: Medicare Other | Admitting: Internal Medicine

## 2019-01-22 ENCOUNTER — Encounter: Payer: Self-pay | Admitting: Internal Medicine

## 2019-01-22 VITALS — BP 166/98 | HR 66 | Ht 70.0 in | Wt 165.0 lb

## 2019-01-22 DIAGNOSIS — I639 Cerebral infarction, unspecified: Secondary | ICD-10-CM | POA: Diagnosis not present

## 2019-01-22 DIAGNOSIS — I480 Paroxysmal atrial fibrillation: Secondary | ICD-10-CM | POA: Diagnosis not present

## 2019-01-22 NOTE — Patient Instructions (Addendum)
Medication Instructions:  Your physician recommends that you continue on your current medications as directed. Please refer to the Current Medication list given to you today.  Labwork: None ordered.  Testing/Procedures: None ordered.  Follow-Up: Your physician wants you to follow-up in: one year with Dr. Lovena Le in [place].   You will receive a reminder letter in the mail two months in advance. If you don't receive a letter, please call our office to schedule the follow-up appointment.   Any Other Special Instructions Will Be Listed Below (If Applicable).  If you need a refill on your cardiac medications before your next appointment, please call your pharmacy.    Implantable Loop Recorder Removal, Care After This sheet gives you information about how to care for yourself after your procedure. Your health care provider may also give you more specific instructions. If you have problems or questions, contact your health care provider. What can I expect after the procedure? After the procedure, it is common to have:  Soreness or discomfort near the incision.  Some swelling or bruising near the incision. Follow these instructions at home: Incision care   Follow instructions from your health care provider about how to take care of your incision. Make sure you: ? Wash your hands with soap and water before you change your bandage (dressing). If soap and water are not available, use hand sanitizer. ? Change your dressing as told by your health care provider. ? Keep your dressing dry. ? Leave stitches (sutures), skin glue, or adhesive strips in place. These skin closures may need to stay in place for 2 weeks or longer. If adhesive strip edges start to loosen and curl up, you may trim the loose edges. Do not remove adhesive strips completely unless your health care provider tells you to do that.  Check your incision area every day for signs of infection. Check for: ? Redness, swelling, or  pain. ? Fluid or blood. ? Warmth. ? Pus or a bad smell.  Do not take baths, swim, or use a hot tub until your health care provider approves. Ask your health care provider if you can take showers. Activity   Return to your normal activities as told by your health care provider. Ask your health care provider what activities are safe for you. Contact a health care provider if:  You have redness, swelling, or pain around your incision.  You have a fever.  You have pain that is not relieved by your pain medicine.   Get help right away if you have:  Chest pain.  Difficulty breathing. Summary  After the procedure, it is common to have soreness or discomfort near the incision.  Change your dressing as told by your health care provider.  Keep all follow-up visits as told by your health care provider. This is important. This information is not intended to replace advice given to you by your health care provider. Make sure you discuss any questions you have with your health care provider. Document Released: 02/09/2015 Document Revised: 04/15/2017 Document Reviewed: 04/15/2017 Elsevier Patient Education  2020 [employer].

## 2019-01-22 NOTE — Progress Notes (Signed)
    HPI Mr. Cameron Romanreturns today for ongoing evaluation and management of a cryptogenic stroke. In the interim, his implantable loop recorder has demonstrated paroxysmal atrial fibrillation. He is asymptomatic. He was also found to have pauses during the daytime and on no meds of up to over 4 seconds. He was asymptomatic. He denies chest pain, shortness of breath, syncope, or peripheral edema.His only complaint today is a cough. No other complaints. His ILR has reached end of service. He presents today to have is device removed.  Allergies  Allergen Reactions  . Percocet [Oxycodone-Acetaminophen] Other (See Comments)    Hallucinations, inability to sleep.     Current Outpatient Medications  Medication Sig Dispense Refill  . apixaban (ELIQUIS) 5 MG TABS tablet Take 1 tablet (5 mg total) by mouth 2 (two) times daily. 180 tablet 3  . Cyanocobalamin (B-12) 1000 MCG/ML KIT Inject 1 mL as directed See admin instructions. Started 09/10/15, 1 injection daily for 7 days them reduce to 1 injection once weekly, then eventually to once monthly    . fluticasone (FLONASE) 50 MCG/ACT nasal spray Place 2 sprays into both nostrils daily. 16 g 6  . gabapentin (NEURONTIN) 300 MG capsule Take 1 capsule (300 mg total) by mouth 2 (two) times daily. TAKE (1) CAPSULE THREE TIMES DAILY. (Needs to be seen before next refill) 180 capsule 3  . HYDROcodone-acetaminophen (NORCO) 10-325 MG tablet Take 1 tablet by mouth 3 (three) times daily.    . Omega-3 Fatty Acids (FISH OIL) 1000 MG CAPS Take 2,000 mg by mouth 2 (two) times daily.    . omeprazole (PRILOSEC) 20 MG capsule Take 1 capsule (20 mg total) by mouth daily. 30 capsule 2  . pravastatin (PRAVACHOL) 20 MG tablet Take 1 tablet (20 mg total) by mouth daily. 90 tablet 3  . sildenafil (REVATIO) 20 MG tablet Take 1-3 tablets (20-60 mg total) by mouth as needed. 20 tablet 1   Current Facility-Administered Medications  Medication Dose Route Frequency Provider Last Rate  Last Dose  . 0.9 %  sodium chloride infusion  500 mL Intravenous Continuous Perry, John N, MD      . cyanocobalamin ((VITAMIN B-12)) injection 1,000 mcg  1,000 mcg Intramuscular Q30 days Bradshaw, Samuel L, MD   1,000 mcg at 05/01/18 1108     Past Medical History:  Diagnosis Date  . Arthritis   . Chronic back pain    buldging disc  . Chronic neck pain    ruptured disc  . Cough    hx smoking  . Diverticulosis   . Esophageal stricture   . GERD (gastroesophageal reflux disease)    takes Prilosec daily  . H/O hiatal hernia   . Hearing loss   . Hepatitis-C 2017  . Hiatal hernia   . History of colon polyps   . Insomnia    takes Elavil nightly  . 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 Stroke ([facility])     ROS:   All systems reviewed and negative except as noted in the HPI.   Past Surgical History:  Procedure Laterality Date  . ANTERIOR CERVICAL DECOMP/DISCECTOMY FUSION  04/12/2011   Procedure: ANTERIOR CERVICAL DECOMPRESSION/DISCECTOMY FUSION 2 LEVELS;  Surgeon: Joseph D Stern, MD;  Location: [facility];  Service: Neurosurgery;  Laterality: N/A;  exploration of Cervical four - seven  fusion with redo Cervical six- seven, cervical three- four anterior cervical decompression with fusion interbody prothesis plating and bonegraft and C34 anterior cervical decompression with inte  .   APPENDECTOMY     at age 17  . CARDIAC CATHETERIZATION  06/03/04  . COLONOSCOPY    . EP IMPLANTABLE DEVICE N/A 09/14/2015   Procedure: Loop Recorder Insertion;  Surgeon:  W , MD;  Location: [facility];  Service: Cardiovascular;  Laterality: N/A;  . HAND SURGERY Right 2005   right  . INNER EAR SURGERY Bilateral    bil;titanium in both ears  . lower back surgery  2009   had plates and screws inserted  . NECK SURGERY  2009   had insertion of  plates and screws  . TEE WITHOUT CARDIOVERSION N/A 09/14/2015   Procedure: TRANSESOPHAGEAL ECHOCARDIOGRAM (TEE);  Surgeon: Philip J Nahser, MD;   Location: [facility];  Service: Cardiovascular;  Laterality: N/A;     Family History  Problem Relation Age of Onset  . Colon cancer Father   . Other Mother        Perforated bowel  . Colon cancer Maternal Grandmother   . Colon cancer Maternal Grandfather   . Heart disease Maternal Uncle   . Stroke Maternal Uncle   . Cancer Brother   . Healthy Son   . Healthy Daughter   . Anesthesia problems Neg Hx   . Hypotension Neg Hx   . Malignant hyperthermia Neg Hx   . Pseudochol deficiency Neg Hx      Social History   Socioeconomic History  . Marital status: Married    Spouse name: Barbara  . Number of children: 2  . Years of education: 8th Grade  . Highest education level: 8th grade  Occupational History  . Occupation: disabled    Employer: DISABLED  Social Needs  . Financial resource strain: Not hard at all  . Food insecurity    Worry: Never true    Inability: Never true  . Transportation needs    Medical: No    Non-medical: No  Tobacco Use  . Smoking status: Former Smoker    Packs/day: 1.00    Years: 41.00    Pack years: 41.00    Types: Cigarettes    Quit date: 06/29/2002    Years since quitting: 16.5  . Smokeless tobacco: Never Used  . Tobacco comment: quit 10yrs ago  Substance and Sexual Activity  . Alcohol use: Not Currently    Alcohol/week: 1.0 standard drinks    Types: 1 Cans of beer per week    Comment: occasional less than one a week  . Drug use: No  . Sexual activity: Yes  Lifestyle  . Physical activity    Days per week: 7 days    Minutes per session: 30 min  . Stress: Not at all  Relationships  . Social connections    Talks on phone: More than three times a week    Gets together: More than three times a week    Attends religious service: Never    Active member of club or organization: No    Attends meetings of clubs or organizations: Never    Relationship status: Married  . Intimate partner violence    Fear of current or ex partner: Not on  file    Emotionally abused: No    Physically abused: No    Forced sexual activity: No  Other Topics Concern  . Not on file  Social History Narrative   Lives with wife in a one story home.  Has 2 children.     Currently does not work.  On disability for wrist pain since early   2000s.   Formerly a truck Geophysicist/field seismologist.     BP (!) 166/98   Pulse 66   Ht 5' 10" (1.778 m)   Wt 165 lb (74.8 kg)   SpO2 98%   BMI 23.68 kg/m   Physical Exam:  Well appearing NAD HEENT: Unremarkable Neck:  No JVD, no thyromegally Lymphatics:  No adenopathy Back:  No CVA tenderness Lungs:  Clear with no wheezes HEART:  Regular rate rhythm, no murmurs, no rubs, no clicks Abd:  soft, positive bowel sounds, no organomegally, no rebound, no guarding Ext:  2 plus pulses, no edema, no cyanosis, no clubbing Skin:  No rashes no nodules Neuro:  CN II through XII intact, motor grossly intact   DEVICE  Normal device function.  See PaceArt for details. RRT   Procedure Note  After informed consent was obtained and the ILR interrogated, the patient was prepped and draped in a sterile fashion. The skin was infiltrated with 8 cc of lidocaine subcutaneously. A one cm stab incision was carried out. A combination of blunt and sharp dissection was used and the ILR was removed with gentle traction. A bandage was placed and the patient returned to his room in stable condition.   Assess/Plan: 1. PAF - he is asymptomatic. He will continue his current meds. 2. Cirrhosis - he is asymptomatic. 3. Sinus node dysfunction - despite documented pauses of 4 seconds, he is asymptomatic.  4. HTN - his Bp is high today but he has been anxious with his ILR removal and his wife being diagnosed with stage 4 lung CA.   Mikle Bosworth.D.

## 2019-01-25 ENCOUNTER — Ambulatory Visit
Admission: RE | Admit: 2019-01-25 | Discharge: 2019-01-25 | Disposition: A | Discharge status: Home / Self Care | Payer: Medicare Other | Source: Ambulatory Visit | Attending: Nurse Practitioner | Admitting: Nurse Practitioner

## 2019-01-25 DIAGNOSIS — K7469 Other cirrhosis of liver: Secondary | ICD-10-CM

## 2019-01-25 DIAGNOSIS — K7460 Unspecified cirrhosis of liver: Secondary | ICD-10-CM | POA: Diagnosis not present

## 2019-01-25 DIAGNOSIS — C220 Liver cell carcinoma: Secondary | ICD-10-CM | POA: Diagnosis not present

## 2019-01-25 DIAGNOSIS — K8020 Calculus of gallbladder without cholecystitis without obstruction: Secondary | ICD-10-CM | POA: Diagnosis not present

## 2019-02-01 ENCOUNTER — Ambulatory Visit: Payer: Medicare Other | Admitting: Internal Medicine

## 2019-02-05 ENCOUNTER — Other Ambulatory Visit: Payer: Self-pay

## 2019-02-06 ENCOUNTER — Encounter: Payer: Self-pay | Admitting: Family Medicine

## 2019-02-06 ENCOUNTER — Ambulatory Visit (INDEPENDENT_AMBULATORY_CARE_PROVIDER_SITE_OTHER): Payer: Medicare Other | Admitting: Family Medicine

## 2019-02-06 VITALS — BP 142/78 | HR 66 | Temp 97.1°F | Ht 70.0 in | Wt 161.2 lb

## 2019-02-06 DIAGNOSIS — M19012 Primary osteoarthritis, left shoulder: Secondary | ICD-10-CM | POA: Diagnosis not present

## 2019-02-06 DIAGNOSIS — E782 Mixed hyperlipidemia: Secondary | ICD-10-CM

## 2019-02-06 DIAGNOSIS — R350 Frequency of micturition: Secondary | ICD-10-CM | POA: Diagnosis not present

## 2019-02-06 DIAGNOSIS — R7303 Prediabetes: Secondary | ICD-10-CM

## 2019-02-06 DIAGNOSIS — K219 Gastro-esophageal reflux disease without esophagitis: Secondary | ICD-10-CM | POA: Diagnosis not present

## 2019-02-06 DIAGNOSIS — I10 Essential (primary) hypertension: Secondary | ICD-10-CM | POA: Diagnosis not present

## 2019-02-06 LAB — BAYER DCA HB A1C WAIVED: HB A1C (BAYER DCA - WAIVED): 5.4 % (ref <7.0)

## 2019-02-06 MED ORDER — METHYLPREDNISOLONE ACETATE 80 MG/ML IJ SUSP
80.0000 mg | Freq: Once | INTRAMUSCULAR | Status: AC
  Administered 2019-02-06: 80 mg via INTRAMUSCULAR

## 2019-02-06 NOTE — Progress Notes (Signed)
BP (!) 142/78   Pulse 66   Temp (!) 97.1 F (36.2 C) (Temporal)   Ht _0  (1.778 m)   Wt 161 lb 3.2 oz (73.1 kg)   SpO2 99%   BMI 23.13 kg/m    Subjective:   Patient ID: Cameron Roman, male    DOB: 06/26/1950, 68 y.o.   MRN: [id number]  HPI: Crispin Vogel is a 68 y.o. male presenting on 02/06/2019 for Medical Management of Chronic Issues (check up of chronic medical conditions) and Shoulder Pain (Bilateral - Patient states it has been ongoing and on and off)   HPI Hypertension Patient is currently on no medication for blood pressure and is diet controlled, and their blood pressure today is 142/78. Patient denies any lightheadedness or dizziness. Patient denies headaches, blurred vision, chest pains, shortness of breath, or weakness. Denies any side effects from medication and is content with current medication.   Hyperlipidemia Patient is coming in for recheck of his hyperlipidemia. The patient is currently taking pravastatin. They deny any issues with myalgias or history of liver damage from it. They deny any focal numbness or weakness or chest pain.   Prediabetes  Patient comes in today for recheck of his diabetes. Patient has been currently taking no medication we are monitoring for prediabetes.  He has been diet controlled. Patient is not currently on an ACE inhibitor/ARB. Patient has not seen an ophthalmologist this year. Patient denies any issues with their feet.    GERD Patient is currently on omeprazole.  She denies any major symptoms or abdominal pain or belching or burping. She denies any blood in her stool or lightheadedness or dizziness.   Patient comes in complaining of bilateral shoulder pain but left worse than right currently, the left shoulder was out this morning and it just has not stopped hurting since this morning.  Feels like his strength is intact and the pain does not go anywhere else except for in the center of that shoulder.  His right shoulder hurts like  this frequently, he says it will happen about every 3 weeks that he will wake up with it like this is stiff and it usually goes away on its own but today it did not.  Patient is complaining of urinary frequency and urgency and hesitancy.  Possibly could be related to prostate versus bladder dysfunction, chronic smoker so we will send to urology.  His wife did just passed away on June 01, 2022 and is very sad about that but he does have a pastor and family close that he is talking to.  Relevant past medical, surgical, family and social history reviewed and updated as indicated. Interim medical history since our last visit reviewed. Allergies and medications reviewed and updated.  Review of Systems  Constitutional: Negative for chills and fever.  Respiratory: Negative for shortness of breath and wheezing.   Cardiovascular: Negative for chest pain and leg swelling.  Gastrointestinal: Negative for abdominal pain.  Genitourinary: Positive for decreased urine volume, difficulty urinating and urgency. Negative for dysuria, flank pain and hematuria.  Musculoskeletal: Negative for back pain and gait problem.  Skin: Negative for rash.  All other systems reviewed and are negative.   Per HPI unless specifically indicated above    Objective:   BP (!) 142/78   Pulse 66   Temp (!) 97.1 F (36.2 C) (Temporal)   Ht _1  (1.778 m)   Wt 161 lb 3.2 oz (73.1 kg)   SpO2 99%   BMI 23.13 kg/m  Wt Readings from Last 3 Encounters:  02/06/19 161 lb 3.2 oz (73.1 kg)  01/22/19 165 lb (74.8 kg)  01/03/19 169 lb (76.7 kg)    Physical Exam Vitals signs and nursing note reviewed.  Constitutional:      General: He is not in acute distress.    Appearance: He is well-developed. He is not diaphoretic.  Eyes:     General: No scleral icterus.       Right eye: No discharge.     Conjunctiva/sclera: Conjunctivae normal.  Neck:     Musculoskeletal: Neck supple.     Thyroid: No thyromegaly.  Cardiovascular:      Rate and Rhythm: Normal rate and regular rhythm.     Heart sounds: Normal heart sounds. No murmur.  Pulmonary:     Effort: Pulmonary effort is normal. No respiratory distress.     Breath sounds: Normal breath sounds. No wheezing.  Musculoskeletal: Normal range of motion.     Right shoulder: He exhibits crepitus (Patient has a small amount of crepitus and pain with overhead range of motion of both shoulders but worse on left). He exhibits normal range of motion, no tenderness, no deformity, no spasm and normal strength.     Left shoulder: He exhibits crepitus (Patient has a small amount of crepitus and pain with overhead range of motion of both shoulders but worse on left). He exhibits normal range of motion, no tenderness, no deformity and normal strength.  Lymphadenopathy:     Cervical: No cervical adenopathy.  Skin:    General: Skin is warm and dry.     Findings: No rash.  Neurological:     Mental Status: He is alert and oriented to person, place, and time.     Coordination: Coordination normal.  Psychiatric:        Behavior: Behavior normal.     Left subacromial injection: Consent form signed. Risk factors of bleeding and infection discussed with patient and patient is agreeable towards injection. Patient prepped with Betadine. Lateral approach towards injection used. Injected 80 mg of Depo-Medrol and 1 mL of 2% lidocaine. Patient tolerated procedure well and no side effects from noted. Minimal to no bleeding. Simple bandage applied after.  Urinalysis:, Will await results  Assessment & Plan:   Problem List Items Addressed This Visit      Cardiovascular and Mediastinum   HTN (hypertension)   Relevant Orders   Microalbumin / creatinine urine ratio   CMP14+EGFR (Completed)     Digestive   GERD     Other   Prediabetes - Primary   Relevant Orders   hgba1c (Completed)   Microalbumin / creatinine urine ratio   CMP14+EGFR (Completed)   HLD (hyperlipidemia)   Relevant Orders    Lipid panel (Completed)    Other Visit Diagnoses    Arthritis of left shoulder region       Relevant Medications   methylPREDNISolone acetate (DEPO-MEDROL) injection 80 mg (Completed)   Urinary frequency       Relevant Orders   PSA, total and free (Completed)   urinalysis- dip and micro   Ambulatory referral to Urology      Urinary issues likely related to prostate but patient would prefer to go see urologist first so we will do referral.  Will run PSA as well and get urinalysis  Patient is diet controlled and hypertension prediabetes we will check labs and continue to monitor closely.  Continue cholesterol medication Follow up plan: Return in about 3 months (around 05/09/2019),  or if symptoms worsen or fail to improve, for Hypertension and cholesterol diabetes.  Counseling provided for all of the vaccine components Orders Placed This Encounter  Procedures  . hgba1c  . Microalbumin / creatinine urine ratio  . CMP14+EGFR  . Lipid panel  . PSA, total and free  . urinalysis- dip and micro  . Ambulatory referral to Urology    Caryl Pina, MD [facility] 02/10/2019, 9:03 PM

## 2019-02-07 LAB — PSA, TOTAL AND FREE
PSA, Free Pct: 40 %
PSA, Free: 0.12 ng/mL
Prostate Specific Ag, Serum: 0.3 ng/mL (ref 0.0–4.0)

## 2019-02-07 LAB — CMP14+EGFR
ALT: 8 IU/L (ref 0–44)
AST: 13 IU/L (ref 0–40)
Albumin/Globulin Ratio: 1.5 (ref 1.2–2.2)
Albumin: 4.3 g/dL (ref 3.8–4.8)
Alkaline Phosphatase: 75 IU/L (ref 39–117)
BUN/Creatinine Ratio: 12 (ref 10–24)
BUN: 10 mg/dL (ref 8–27)
Bilirubin Total: 0.4 mg/dL (ref 0.0–1.2)
CO2: 23 mmol/L (ref 20–29)
Calcium: 9.3 mg/dL (ref 8.6–10.2)
Chloride: 104 mmol/L (ref 96–106)
Creatinine, Ser: 0.84 mg/dL (ref 0.76–1.27)
GFR calc Af Amer: 105 mL/min/{1.73_m2} (ref >59)
GFR calc non Af Amer: 91 mL/min/{1.73_m2} (ref >59)
Globulin, Total: 2.8 g/dL (ref 1.5–4.5)
Glucose: 97 mg/dL (ref 65–99)
Potassium: 4.2 mmol/L (ref 3.5–5.2)
Sodium: 139 mmol/L (ref 134–144)
Total Protein: 7.1 g/dL (ref 6.0–8.5)

## 2019-02-07 LAB — LIPID PANEL
Chol/HDL Ratio: 3.3 ratio (ref 0.0–5.0)
Cholesterol, Total: 105 mg/dL (ref 100–199)
HDL: 32 mg/dL — ABNORMAL LOW (ref >39)
LDL Chol Calc (NIH): 50 mg/dL (ref 0–99)
Triglycerides: 132 mg/dL (ref 0–149)
VLDL Cholesterol Cal: 23 mg/dL (ref 5–40)

## 2019-02-08 ENCOUNTER — Ambulatory Visit: Payer: Medicare Other | Admitting: Family Medicine

## 2019-02-21 ENCOUNTER — Other Ambulatory Visit: Payer: Self-pay

## 2019-02-21 ENCOUNTER — Other Ambulatory Visit: Payer: Medicare Other

## 2019-02-21 DIAGNOSIS — I10 Essential (primary) hypertension: Secondary | ICD-10-CM | POA: Diagnosis not present

## 2019-02-21 DIAGNOSIS — R350 Frequency of micturition: Secondary | ICD-10-CM | POA: Diagnosis not present

## 2019-02-21 DIAGNOSIS — R7303 Prediabetes: Secondary | ICD-10-CM | POA: Diagnosis not present

## 2019-02-21 LAB — URINALYSIS, COMPLETE
Bilirubin, UA: NEGATIVE
Glucose, UA: NEGATIVE
Ketones, UA: NEGATIVE
Leukocytes,UA: NEGATIVE
Nitrite, UA: NEGATIVE
Protein,UA: NEGATIVE
Specific Gravity, UA: 1.02 (ref 1.005–1.030)
Urobilinogen, Ur: 0.2 mg/dL (ref 0.2–1.0)
pH, UA: 6 (ref 5.0–7.5)

## 2019-02-21 LAB — MICROSCOPIC EXAMINATION
Bacteria, UA: NONE SEEN
RBC, Urine: NONE SEEN /hpf (ref 0–2)
Renal Epithel, UA: NONE SEEN /hpf
WBC, UA: NONE SEEN /hpf (ref 0–5)

## 2019-02-22 LAB — MICROALBUMIN / CREATININE URINE RATIO
Creatinine, Urine: 131.4 mg/dL
Microalb/Creat Ratio: 5 mg/g creat (ref 0–29)
Microalbumin, Urine: 7.1 ug/mL

## 2019-03-18 ENCOUNTER — Other Ambulatory Visit: Payer: Self-pay | Admitting: Internal Medicine

## 2019-03-19 DIAGNOSIS — R300 Dysuria: Secondary | ICD-10-CM | POA: Diagnosis not present

## 2019-03-19 DIAGNOSIS — R3915 Urgency of urination: Secondary | ICD-10-CM | POA: Diagnosis not present

## 2019-03-21 LAB — CUP PACEART INCLINIC DEVICE CHECK
Date Time Interrogation Session: 20201010122703
Implantable Pulse Generator Implant Date: 20170703

## 2019-03-27 DIAGNOSIS — K7469 Other cirrhosis of liver: Secondary | ICD-10-CM | POA: Diagnosis not present

## 2019-04-16 DIAGNOSIS — M6289 Other specified disorders of muscle: Secondary | ICD-10-CM | POA: Diagnosis not present

## 2019-04-16 DIAGNOSIS — M6281 Muscle weakness (generalized): Secondary | ICD-10-CM | POA: Diagnosis not present

## 2019-04-16 DIAGNOSIS — M62838 Other muscle spasm: Secondary | ICD-10-CM | POA: Diagnosis not present

## 2019-05-01 DIAGNOSIS — R3915 Urgency of urination: Secondary | ICD-10-CM | POA: Diagnosis not present

## 2019-05-01 DIAGNOSIS — M62838 Other muscle spasm: Secondary | ICD-10-CM | POA: Diagnosis not present

## 2019-05-01 DIAGNOSIS — R300 Dysuria: Secondary | ICD-10-CM | POA: Diagnosis not present

## 2019-05-01 DIAGNOSIS — M6281 Muscle weakness (generalized): Secondary | ICD-10-CM | POA: Diagnosis not present

## 2019-05-17 ENCOUNTER — Other Ambulatory Visit: Payer: Self-pay | Admitting: Internal Medicine

## 2019-05-20 ENCOUNTER — Other Ambulatory Visit: Payer: Self-pay

## 2019-05-20 ENCOUNTER — Ambulatory Visit (INDEPENDENT_AMBULATORY_CARE_PROVIDER_SITE_OTHER): Payer: Medicare Other | Admitting: Family Medicine

## 2019-05-20 ENCOUNTER — Encounter: Payer: Self-pay | Admitting: Family Medicine

## 2019-05-20 VITALS — BP 123/70 | HR 63 | Temp 98.4°F | Ht 68.0 in | Wt 157.0 lb

## 2019-05-20 DIAGNOSIS — K219 Gastro-esophageal reflux disease without esophagitis: Secondary | ICD-10-CM

## 2019-05-20 DIAGNOSIS — I10 Essential (primary) hypertension: Secondary | ICD-10-CM | POA: Diagnosis not present

## 2019-05-20 DIAGNOSIS — E782 Mixed hyperlipidemia: Secondary | ICD-10-CM

## 2019-05-20 DIAGNOSIS — E538 Deficiency of other specified B group vitamins: Secondary | ICD-10-CM | POA: Diagnosis not present

## 2019-05-20 DIAGNOSIS — R7303 Prediabetes: Secondary | ICD-10-CM | POA: Diagnosis not present

## 2019-05-20 DIAGNOSIS — L600 Ingrowing nail: Secondary | ICD-10-CM

## 2019-05-20 DIAGNOSIS — B351 Tinea unguium: Secondary | ICD-10-CM

## 2019-05-20 DIAGNOSIS — I480 Paroxysmal atrial fibrillation: Secondary | ICD-10-CM | POA: Diagnosis not present

## 2019-05-20 LAB — BAYER DCA HB A1C WAIVED: HB A1C (BAYER DCA - WAIVED): 5.6 % (ref <7.0)

## 2019-05-20 MED ORDER — PRAVASTATIN SODIUM 20 MG PO TABS: 20.0000 mg | ORAL_TABLET | Freq: Every day | ORAL | 3 refills | Status: DC

## 2019-05-20 MED ORDER — OMEPRAZOLE 20 MG PO CPDR: 20.0000 mg | DELAYED_RELEASE_CAPSULE | Freq: Every day | ORAL | 1 refills | Status: DC

## 2019-05-20 NOTE — Progress Notes (Signed)
BP 123/70   Pulse 63   Temp 98.4 F (36.9 C)   Ht 5' 8"  (1.727 m)   Wt 157 lb (71.2 kg)   SpO2 99%   BMI 23.87 kg/m    Subjective:   Patient ID: Cameron Roman, male    DOB: 1950/08/10, 69 y.o.   MRN: [id number]  HPI: Gerome Kokesh is a 69 y.o. male presenting on 05/20/2019 for Medical Management of Chronic Issues   HPI Prediabetes Patient comes in today for recheck of his diabetes. Patient has been currently taking no medication and has been diet controlled.. Patient is not currently on an ACE inhibitor/ARB. Patient has not seen an ophthalmologist this year. Patient patient has pain in his right great toe and thickened toenails, he would like to see a podiatrist  Hypertension Patient is currently on no medication currently has been diet controlled, and their blood pressure today is 123/70. Patient denies any lightheadedness or dizziness. Patient denies headaches, blurred vision, chest pains, shortness of breath, or weakness. Denies any side effects from medication and is content with current medication.   Hyperlipidemia Patient is coming in for recheck of his hyperlipidemia. The patient is currently taking pravastatin. They deny any issues with myalgias or history of liver damage from it. They deny any focal numbness or weakness or chest pain.   GERD Patient is currently on omeprazole.  She denies any major symptoms or abdominal pain or belching or burping. She denies any blood in her stool or lightheadedness or dizziness.   We will do a recheck of patient's B12 deficiency, he has not been taking the B12 injections in almost a year since the pandemic, we will see where his B12 is and see if he even needs it.  Relevant past medical, surgical, family and social history reviewed and updated as indicated. Interim medical history since our last visit reviewed. Allergies and medications reviewed and updated.  Review of Systems  Constitutional: Negative for chills and fever.  Eyes:  Negative for discharge.  Respiratory: Negative for shortness of breath and wheezing.   Cardiovascular: Negative for chest pain and leg swelling.  Musculoskeletal: Negative for back pain and gait problem.  Skin: Negative for color change, rash and wound.  All other systems reviewed and are negative.   Per HPI unless specifically indicated above   Allergies as of 05/20/2019      Reactions   Percocet [oxycodone-acetaminophen] Other (See Comments)   Hallucinations, inability to sleep.   Oxycodone-acetaminophen       Medication List       Accurate as of May 20, 2019  9:52 AM. If you have any questions, ask your nurse or doctor.        STOP taking these medications   B-12 1000 MCG/ML Kit Stopped by: Worthy Rancher, MD   sildenafil 20 MG tablet Commonly known as: REVATIO Stopped by: [name] Merland Holness, MD     TAKE these medications   fluticasone 50 MCG/ACT nasal spray Commonly known as: FLONASE Place 2 sprays into both nostrils daily.   gabapentin 300 MG capsule Commonly known as: NEURONTIN Take 1 capsule (300 mg total) by mouth 2 (two) times daily. TAKE (1) CAPSULE THREE TIMES DAILY. (Needs to be seen before next refill)   HYDROcodone-acetaminophen 10-325 MG tablet Commonly known as: NORCO Take 1 tablet by mouth 3 (three) times daily.   omeprazole 20 MG capsule Commonly known as: PRILOSEC Take 1 capsule (20 mg total) by mouth daily. Patient needs office visit  for further refills   pravastatin 20 MG tablet Commonly known as: PRAVACHOL Take 1 tablet (20 mg total) by mouth daily.        Objective:   BP 123/70   Pulse 63   Temp 98.4 F (36.9 C)   Ht 5' 8"  (1.727 m)   Wt 157 lb (71.2 kg)   SpO2 99%   BMI 23.87 kg/m   Wt Readings from Last 3 Encounters:  05/20/19 157 lb (71.2 kg)  02/06/19 161 lb 3.2 oz (73.1 kg)  01/22/19 165 lb (74.8 kg)    Physical Exam Vitals and nursing note reviewed.  Constitutional:      General: He is not in acute  distress.    Appearance: He is well-developed. He is not diaphoretic.  Eyes:     General: No scleral icterus.       Right eye: No discharge.     Conjunctiva/sclera: Conjunctivae normal.     Pupils: Pupils are equal, round, and reactive to light.  Neck:     Thyroid: No thyromegaly.  Cardiovascular:     Rate and Rhythm: Normal rate and regular rhythm.     Heart sounds: Normal heart sounds. No murmur.  Pulmonary:     Effort: Pulmonary effort is normal. No respiratory distress.     Breath sounds: Normal breath sounds. No wheezing.  Musculoskeletal:        General: Normal range of motion.     Cervical back: Neck supple.  Lymphadenopathy:     Cervical: No cervical adenopathy.  Skin:    General: Skin is warm and dry.     Findings: No rash.     Comments: Thickened and yellowed left great toenail and right great toenail and right third toenail, pain around the edge of the right great toenail, no signs of erythema  Neurological:     Mental Status: He is alert and oriented to person, place, and time.     Coordination: Coordination normal.  Psychiatric:        Behavior: Behavior normal.       Assessment & Plan:   Problem List Items Addressed This Visit      Cardiovascular and Mediastinum   HTN (hypertension)   Relevant Medications   pravastatin (PRAVACHOL) 20 MG tablet   Other Relevant Orders   CMP14+EGFR   Paroxysmal atrial fibrillation (HCC)   Relevant Medications   pravastatin (PRAVACHOL) 20 MG tablet   Other Relevant Orders   CBC with Differential/Platelet     Digestive   GERD   Relevant Orders   CBC with Differential/Platelet     Other   Prediabetes - Primary   Relevant Orders   Bayer DCA Hb A1c Waived   HLD (hyperlipidemia)   Relevant Medications   pravastatin (PRAVACHOL) 20 MG tablet   Other Relevant Orders   Lipid panel   B12 deficiency   Relevant Orders   Vitamin B12    Other Visit Diagnoses    Onychomycosis       Relevant Orders   Ambulatory  referral to Podiatry   Ingrown nail of great toe of left foot       Relevant Orders   Ambulatory referral to Podiatry      Continue current medication, will refer to podiatry.  Will check B12 to see if even needs the injections in the future. Follow up plan: Return in about 6 months (around 11/20/2019), or if symptoms worsen or fail to improve, for Prediabetes hypertension cholesterol recheck.  Counseling provided  for all of the vaccine components Orders Placed This Encounter  Procedures  . Bayer DCA Hb A1c Waived  . CBC with Differential/Platelet  . CMP14+EGFR  . Lipid panel  . Vitamin B12  . Ambulatory referral to [place], MD [facility] 05/20/2019, 9:52 AM

## 2019-05-21 DIAGNOSIS — M6281 Muscle weakness (generalized): Secondary | ICD-10-CM | POA: Diagnosis not present

## 2019-05-21 DIAGNOSIS — M62838 Other muscle spasm: Secondary | ICD-10-CM | POA: Diagnosis not present

## 2019-05-21 DIAGNOSIS — R3915 Urgency of urination: Secondary | ICD-10-CM | POA: Diagnosis not present

## 2019-05-21 LAB — CBC WITH DIFFERENTIAL/PLATELET
Basophils Absolute: 0.1 10*3/uL (ref 0.0–0.2)
Basos: 1 %
EOS (ABSOLUTE): 0.2 10*3/uL (ref 0.0–0.4)
Eos: 2 %
Hematocrit: 38.6 % (ref 37.5–51.0)
Hemoglobin: 12.9 g/dL — ABNORMAL LOW (ref 13.0–17.7)
Immature Grans (Abs): 0 10*3/uL (ref 0.0–0.1)
Immature Granulocytes: 0 %
Lymphocytes Absolute: 4.6 10*3/uL — ABNORMAL HIGH (ref 0.7–3.1)
Lymphs: 44 %
MCH: 32.7 pg (ref 26.6–33.0)
MCHC: 33.4 g/dL (ref 31.5–35.7)
MCV: 98 fL — ABNORMAL HIGH (ref 79–97)
Monocytes Absolute: 1 10*3/uL — ABNORMAL HIGH (ref 0.1–0.9)
Monocytes: 9 %
Neutrophils Absolute: 4.6 10*3/uL (ref 1.4–7.0)
Neutrophils: 44 %
Platelets: 277 10*3/uL (ref 150–450)
RBC: 3.94 x10E6/uL — ABNORMAL LOW (ref 4.14–5.80)
RDW: 14 % (ref 11.6–15.4)
WBC: 10.5 10*3/uL (ref 3.4–10.8)

## 2019-05-21 LAB — CMP14+EGFR
ALT: 8 IU/L (ref 0–44)
AST: 13 IU/L (ref 0–40)
Albumin/Globulin Ratio: 1.8 (ref 1.2–2.2)
Albumin: 4.4 g/dL (ref 3.8–4.8)
Alkaline Phosphatase: 70 IU/L (ref 39–117)
BUN/Creatinine Ratio: 18 (ref 10–24)
BUN: 15 mg/dL (ref 8–27)
Bilirubin Total: 0.3 mg/dL (ref 0.0–1.2)
CO2: 21 mmol/L (ref 20–29)
Calcium: 9.5 mg/dL (ref 8.6–10.2)
Chloride: 106 mmol/L (ref 96–106)
Creatinine, Ser: 0.85 mg/dL (ref 0.76–1.27)
GFR calc Af Amer: 104 mL/min/{1.73_m2} (ref >59)
GFR calc non Af Amer: 90 mL/min/{1.73_m2} (ref >59)
Globulin, Total: 2.5 g/dL (ref 1.5–4.5)
Glucose: 92 mg/dL (ref 65–99)
Potassium: 4.7 mmol/L (ref 3.5–5.2)
Sodium: 140 mmol/L (ref 134–144)
Total Protein: 6.9 g/dL (ref 6.0–8.5)

## 2019-05-21 LAB — LIPID PANEL
Chol/HDL Ratio: 2.7 ratio (ref 0.0–5.0)
Cholesterol, Total: 120 mg/dL (ref 100–199)
HDL: 44 mg/dL (ref >39)
LDL Chol Calc (NIH): 56 mg/dL (ref 0–99)
Triglycerides: 108 mg/dL (ref 0–149)
VLDL Cholesterol Cal: 20 mg/dL (ref 5–40)

## 2019-05-21 LAB — VITAMIN B12: Vitamin B-12: 293 pg/mL (ref 232–1245)

## 2019-05-23 ENCOUNTER — Ambulatory Visit (INDEPENDENT_AMBULATORY_CARE_PROVIDER_SITE_OTHER): Payer: Medicare Other | Admitting: *Deleted

## 2019-05-23 DIAGNOSIS — Z0000 Encounter for general adult medical examination without abnormal findings: Secondary | ICD-10-CM | POA: Diagnosis not present

## 2019-05-23 NOTE — Progress Notes (Signed)
MEDICARE ANNUAL WELLNESS VISIT  05/23/2019  Telephone Visit Disclaimer This Medicare AWV was conducted by telephone due to national recommendations for restrictions regarding the COVID-19 Pandemic (e.g. social distancing).  I verified, using two identifiers, that I am speaking with Cameron Roman or their authorized healthcare agent. I discussed the limitations, risks, security, and privacy concerns of performing an evaluation and management service by telephone and the potential availability of an in-person appointment in the future. The patient expressed understanding and agreed to proceed.   Subjective:  Cameron Roman is a 69 y.o. male patient of Dettinger, [name], MD who had a Medicare Annual Wellness Visit today via telephone. Cameron Roman is Roman and lives alone as his wife passed away 2 weeks prior to Thanksgiving. he has 2 children. he reports that he is socially active and does interact with friends/family regularly. he is markedly physically active and enjoys playing with his dogs, taking care of his chickens, working in [employer] garden and working on his car.  Patient Care Team: Dettinger, [name], MD as PCP - General (Family Medicine) Evans Lance, MD as PCP - Electrophysiology (Cardiology)  Advanced Directives 05/23/2019 09/19/2016 09/14/2015 09/11/2015 04/13/2011 04/12/2011 04/08/2011  Does Patient Have a Medical Advance Directive? No No No No Patient does not have advance directive;Patient would not like information - Patient does not have advance directive;Patient would not like information  Would patient like information on creating a medical advance directive? No - Patient declined - No - patient declined information - - - -  Pre-existing out of facility DNR order (yellow form or pink MOST form) - - - - - No No    Hospital Utilization Over the Past 12 Months: # of hospitalizations or ER visits: 0 # of surgeries: 0  Review of Systems    Patient reports that his overall health  is unchanged compared to last year.  History obtained from chart review  Patient Reported Readings (BP, Pulse, CBG, Weight, etc) none  Pain Assessment Pain : No/denies pain     Current Medications & Allergies (verified) Allergies as of 05/23/2019      Reactions   Percocet [oxycodone-acetaminophen] Other (See Comments)   Hallucinations, inability to sleep.      Medication List       Accurate as of May 23, 2019  9:11 AM. If you have any questions, ask your nurse or doctor.        Fish Oil 1000 MG Caps Take by mouth.   fluticasone 50 MCG/ACT nasal spray Commonly known as: FLONASE Place 2 sprays into both nostrils daily.   gabapentin 300 MG capsule Commonly known as: NEURONTIN Take 1 capsule (300 mg total) by mouth 2 (two) times daily. TAKE (1) CAPSULE THREE TIMES DAILY. (Needs to be seen before next refill)   HYDROcodone-acetaminophen 10-325 MG tablet Commonly known as: NORCO Take 1 tablet by mouth 3 (three) times daily.   meloxicam 15 MG tablet Commonly known as: MOBIC Take 15 mg by mouth daily.   omeprazole 20 MG capsule Commonly known as: PRILOSEC Take 1 capsule (20 mg total) by mouth daily.   pravastatin 20 MG tablet Commonly known as: PRAVACHOL Take 1 tablet (20 mg total) by mouth daily.   sildenafil 100 MG tablet Commonly known as: VIAGRA Take 100 mg by mouth as directed.   tamsulosin 0.4 MG Caps capsule Commonly known as: FLOMAX Take 0.4 mg by mouth daily.       History (reviewed): Past Medical History:  Diagnosis Date  . Arthritis   . Chronic back pain    buldging disc  . Chronic neck pain    ruptured disc  . Cough    hx smoking  . Diverticulosis   . Esophageal stricture   . GERD (gastroesophageal reflux disease)    takes Prilosec daily  . H/O hiatal hernia   . Hearing loss   . Hepatitis-C 2017  . Hiatal hernia   . History of colon polyps   . Insomnia    takes Elavil nightly  . 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 Stroke  [facility])    Past Surgical History:  Procedure Laterality Date  . ANTERIOR CERVICAL DECOMP/DISCECTOMY FUSION  04/12/2011   Procedure: ANTERIOR CERVICAL DECOMPRESSION/DISCECTOMY FUSION 2 LEVELS;  Surgeon: Peggyann Shoals, MD;  Location: [place] NEURO ORS;  Service: Neurosurgery;  Laterality: N/A;  exploration of Cervical four - seven  fusion with redo Cervical six- seven, cervical three- four anterior cervical decompression with fusion interbody prothesis plating and bonegraft and C34 anterior cervical decompression with inte  . APPENDECTOMY     at age 74  . CARDIAC CATHETERIZATION  06/03/04  . COLONOSCOPY    . EP IMPLANTABLE DEVICE N/A 09/14/2015   Procedure: Loop Recorder Insertion;  Surgeon: Evans Lance, MD;  Location: [place] CV LAB;  Service: Cardiovascular;  Laterality: N/A;  . HAND SURGERY Right 2005   right  . INNER EAR SURGERY Bilateral    bil;titanium in both ears  . lower back surgery  2009   had plates and screws inserted  . NECK SURGERY  2009   had insertion of  plates and screws  . TEE WITHOUT CARDIOVERSION N/A 09/14/2015   Procedure: TRANSESOPHAGEAL ECHOCARDIOGRAM (TEE);  Surgeon: Thayer Headings, MD;  Location: [facility] ENDOSCOPY;  Service: Cardiovascular;  Laterality: N/A;   Family History  Problem Relation Age of Onset  . Colon cancer Father   . Other Mother        Perforated bowel  . Colon cancer Maternal Grandmother   . Colon cancer Maternal Grandfather   . Heart disease Maternal Uncle   . Stroke Maternal Uncle   . Cancer Brother   . Healthy Son   . Healthy Daughter   . Anesthesia problems Neg Hx   . Hypotension Neg Hx   . Malignant hyperthermia Neg Hx   . Pseudochol deficiency Neg Hx    Social History   Socioeconomic History  . Marital status: Widowed    Spouse name: Pamala Hurry  . Number of children: 2  . Years of education: 8th Grade  . Highest education level: 8th grade  Occupational History  . Occupation: disabled    Employer: DISABLED  Tobacco Use  . Smoking  status: Former Smoker    Packs/day: 1.00    Years: 41.00    Pack years: 41.00    Types: Cigarettes    Quit date: 06/29/2002    Years since quitting: 16.9  . Smokeless tobacco: Never Used  . Tobacco comment: quit 60yrs ago  Substance and Sexual Activity  . Alcohol use: Not Currently    Alcohol/week: 1.0 standard drinks    Types: 1 Cans of beer per week    Comment: occasional less than one a week  . Drug use: No  . Sexual activity: Yes    Birth control/protection: None  Other Topics Concern  . Not on file  Social History Narrative   Lives alone, his wife passed away 01-29-2019.  Has 2 children.  Currently does not work.  On disability for wrist pain since early 2000s.   Formerly a Administrator.   Social Determinants of Health   Financial Resource Strain: High Risk  . Difficulty of Paying Living Expenses: Hard  Food Insecurity: No Food Insecurity  . Worried About Charity fundraiser in the Last Year: Never true  . Ran Out of Food in the Last Year: Never true  Transportation Needs: No Transportation Needs  . Lack of Transportation (Medical): No  . Lack of Transportation (Non-Medical): No  Physical Activity: Sufficiently Active  . Days of Exercise per Week: 7 days  . Minutes of Exercise per Session: 40 min  Stress: No Stress Concern Present  . Feeling of Stress : Not at all  Social Connections: Moderately Isolated  . Frequency of Communication with Friends and Family: More than three times a week  . Frequency of Social Gatherings with Friends and Family: More than three times a week  . Attends Religious Services: Never  . Active Member of Clubs or Organizations: No  . Attends Archivist Meetings: Never  . Marital Status: Widowed    Activities of Daily Living In your present state of health, do you have any difficulty performing the following activities: 05/23/2019  Hearing? Y  Comment has had multiple ear surgeries-has seen audiology and needs hearing aids    Vision? N  Comment wears glasses-gets eye exam every 2 years  Difficulty concentrating or making decisions? Y  Comment has noticed his memory isn't as good as it used to be  Walking or climbing stairs? N  Dressing or bathing? N  Doing errands, shopping? N  Preparing Food and eating ? N  Using the Toilet? N  In the past six months, have you accidently leaked urine? N  Do you have problems with loss of bowel control? N  Managing your Medications? N  Managing your Finances? N  Housekeeping or managing your Housekeeping? N  Some recent data might be hidden    Patient Education/ Literacy How often do you need to have someone help you when you read instructions, pamphlets, or other written materials from your doctor or pharmacy?: 1 - Never What is the last grade level you completed in school?: 8th grade  Exercise Current Exercise Habits: Home exercise routine, Type of exercise: treadmill;strength training/weights, Time (Minutes): 45, Frequency (Times/Week): 7, Weekly Exercise (Minutes/Week): 315, Intensity: Mild, Exercise limited by: orthopedic condition(s)  Diet Patient reports consuming 3 meals a day and 1 snack(s) a day Patient reports that his primary diet is: Regular Patient reports that she does have regular access to food.   Depression Screen PHQ 2/9 Scores 05/23/2019 05/20/2019 02/06/2019 11/08/2018 05/22/2018 05/01/2018 02/22/2018  PHQ - 2 Score 0 0 0 0 0 0 0  PHQ- 9 Score - - - - - 0 -     Fall Risk Fall Risk  05/23/2019 05/20/2019 02/06/2019 11/08/2018 05/22/2018  Roman in the past year? 0 0 0 0 0  Number Roman in past yr: - - - - -  Injury with Fall? - - - - -  Risk for fall due to : - - - - -  Follow up - - - - -     Objective:  Cameron Roman seemed alert and oriented and he participated appropriately during our telephone visit.  Blood Pressure Weight BMI  BP Readings from Last 3 Encounters:  05/20/19 123/70  02/06/19 (!) 142/78  01/22/19 (!) 166/98   Wt Readings from  Last 3 Encounters:  05/20/19 157 lb (71.2 kg)  02/06/19 161 lb 3.2 oz (73.1 kg)  01/22/19 165 lb (74.8 kg)   BMI Readings from Last 1 Encounters:  05/20/19 23.87 kg/m    *Unable to obtain current vital signs, weight, and BMI due to telephone visit type  Hearing/Vision  . Cameron Roman did not seem to have difficulty with hearing/understanding during the telephone conversation . Reports that he has had a formal eye exam by an eye care professional within the past year . Reports that he has not had a formal hearing evaluation within the past year *Unable to fully assess hearing and vision during telephone visit type  Cognitive Function: 6CIT Screen 05/23/2019  What Year? 0 points  What month? 0 points  What time? 0 points  Count back from 20 0 points  Months in reverse 0 points  Repeat phrase 4 points  Total Score 4   (Normal:0-7, Significant for Dysfunction: >8)  Normal Cognitive Function Screening: Yes   Immunization & Health Maintenance Record Immunization History  Administered Date(s) Administered  . Fluad Quad(high Dose 65+) 01/18/2019  . Influenza, High Dose Seasonal PF 01/10/2018  . Influenza,inj,Quad PF,6+ Mos 12/11/2012, 01/21/2014, 02/17/2015, 12/23/2015, 01/06/2017  . Zoster 03/12/2013    Health Maintenance  Topic Date Due  . TETANUS/TDAP  02/06/2020 (Originally 03/12/2016)  . PNA vac Low Risk Adult (1 of 2 - PCV13) 02/06/2020 (Originally 12/18/2016)  . COLONOSCOPY  09/20/2019  . HEMOGLOBIN A1C  11/20/2019  . FOOT EXAM  02/06/2020  . URINE MICROALBUMIN  02/21/2020  . OPHTHALMOLOGY EXAM  03/17/2020  . INFLUENZA VACCINE  Completed  . Hepatitis C Screening  Completed       Assessment  This is a routine wellness examination for Cameron Roman.  Health Maintenance: Due or Overdue There are no preventive care reminders to display for this patient.  Cameron Roman does not need a referral for Community Assistance: Care Management:   no Social  Work:    no Prescription Assistance:  no Nutrition/Diabetes Education:  no   Plan:  Personalized Goals Goals Addressed            This Visit's Progress   . DIET - INCREASE WATER INTAKE       Try to drink 6-8 glasses of water daily      Personalized Health Maintenance & Screening Recommendations  Pneumococcal vaccine  Td vaccine  Lung Cancer Screening Recommended: no (Low Dose CT Chest recommended if Age 47-80 years, 30 pack-year currently smoking OR have quit w/in past 15 years) Hepatitis C Screening recommended: no HIV Screening recommended: no  Advanced Directives: Written information was not prepared per patient's request.  Referrals & Orders No orders of the defined types were placed in this encounter.   Follow-up Plan . Follow-up with Dettinger, [name], MD as planned . Consider Prevnar and TDAP vaccines at your next visit with your PCP   I have personally reviewed and noted the following in the patient's chart:   . Medical and social history . Use of alcohol, tobacco or illicit drugs  . Current medications and supplements . Functional ability and status . Nutritional status . Physical activity . Advanced directives . List of other physicians . Hospitalizations, surgeries, and ER visits in previous 12 months . Vitals . Screenings to include cognitive, depression, and Roman . Referrals and appointments  In addition, I have reviewed and discussed with Cameron Roman certain preventive protocols, quality metrics, and best practice recommendations. A written personalized care  plan for preventive services as well as general preventive health recommendations is available and can be mailed to the patient at his request.      Milas Hock, LPN  1/83/[id number]

## 2019-05-23 NOTE — Patient Instructions (Signed)

## 2019-05-30 ENCOUNTER — Ambulatory Visit: Payer: Medicare Other | Admitting: Internal Medicine

## 2019-06-06 DIAGNOSIS — M79675 Pain in left toe(s): Secondary | ICD-10-CM | POA: Diagnosis not present

## 2019-06-06 DIAGNOSIS — L03032 Cellulitis of left toe: Secondary | ICD-10-CM | POA: Diagnosis not present

## 2019-06-12 DIAGNOSIS — M6281 Muscle weakness (generalized): Secondary | ICD-10-CM | POA: Diagnosis not present

## 2019-06-12 DIAGNOSIS — R300 Dysuria: Secondary | ICD-10-CM | POA: Diagnosis not present

## 2019-06-12 DIAGNOSIS — R3915 Urgency of urination: Secondary | ICD-10-CM | POA: Diagnosis not present

## 2019-06-12 DIAGNOSIS — M62838 Other muscle spasm: Secondary | ICD-10-CM | POA: Diagnosis not present

## 2019-06-17 ENCOUNTER — Other Ambulatory Visit: Payer: Self-pay | Admitting: Family Medicine

## 2019-06-25 DIAGNOSIS — M79675 Pain in left toe(s): Secondary | ICD-10-CM | POA: Diagnosis not present

## 2019-06-25 DIAGNOSIS — B351 Tinea unguium: Secondary | ICD-10-CM | POA: Diagnosis not present

## 2019-06-26 ENCOUNTER — Telehealth: Payer: Self-pay | Admitting: Family Medicine

## 2019-06-26 NOTE — Chronic Care Management (AMB) (Signed)
  Chronic Care Management   Note  06/26/2019 Name: Cameron Roman MRN: [id number] DOB: 02/27/1951  Cameron Roman is a 69 y.o. year old male who is a primary care patient of Dettinger, [name], MD. I reached out to [name] by phone today in response to a referral sent by Cameron Roman health plan.     Cameron Roman was given information about Chronic Care Management services today including:  1. CCM service includes personalized support from designated clinical staff supervised by his physician, including individualized plan of care and coordination with other care providers 2. 24/7 contact phone numbers for assistance for urgent and routine care needs. 3. Service will only be billed when office clinical staff spend 20 minutes or more in a month to coordinate care. 4. Only one practitioner may furnish and bill the service in a calendar month. 5. The patient may stop CCM services at any time (effective at the end of the month) by phone call to the office staff. 6. The patient will be responsible for cost sharing (co-pay) of up to 20% of the service fee (after annual deductible is met).  Patient did not agree to enrollment in care management services and does not wish to consider at this time.  Follow up plan: The patient has been provided with contact information for the care management team and has been advised to call with any health related questions or concerns.   [place], [place] [address] Direct Dial: [phone] [email or web address]: [email or web address]

## 2019-07-01 DIAGNOSIS — R3915 Urgency of urination: Secondary | ICD-10-CM | POA: Diagnosis not present

## 2019-07-10 ENCOUNTER — Encounter: Payer: Self-pay | Admitting: Internal Medicine

## 2019-07-10 ENCOUNTER — Ambulatory Visit: Payer: Medicare Other | Admitting: Internal Medicine

## 2019-07-10 VITALS — BP 104/58 | HR 54 | Temp 97.8°F | Ht 68.0 in | Wt 156.0 lb

## 2019-07-10 DIAGNOSIS — K222 Esophageal obstruction: Secondary | ICD-10-CM

## 2019-07-10 DIAGNOSIS — R1310 Dysphagia, unspecified: Secondary | ICD-10-CM | POA: Diagnosis not present

## 2019-07-10 DIAGNOSIS — Z86010 Personal history of colonic polyps: Secondary | ICD-10-CM | POA: Diagnosis not present

## 2019-07-10 DIAGNOSIS — Z01818 Encounter for other preprocedural examination: Secondary | ICD-10-CM

## 2019-07-10 DIAGNOSIS — K219 Gastro-esophageal reflux disease without esophagitis: Secondary | ICD-10-CM | POA: Diagnosis not present

## 2019-07-10 MED ORDER — NA SULFATE-K SULFATE-MG SULF 17.5-3.13-1.6 GM/177ML PO SOLN: 1.0000 | Freq: Once | ORAL | 0 refills | Status: AC

## 2019-07-10 MED ORDER — OMEPRAZOLE 20 MG PO CPDR: 20.0000 mg | DELAYED_RELEASE_CAPSULE | Freq: Every day | ORAL | 6 refills | Status: DC

## 2019-07-10 NOTE — Patient Instructions (Signed)
  We have sent the following medications to your pharmacy for you to pick up at your convenience:  Omeprazole   You have been scheduled for an endoscopy and colonoscopy. Please follow the written instructions given to you at your visit today. Please pick up your prep supplies at the pharmacy within the next 1-3 days. If you use inhalers (even only as needed), please bring them with you on the day of your procedure.

## 2019-07-10 NOTE — Progress Notes (Signed)
HISTORY OF PRESENT ILLNESS:  Cameron Roman is a 69 y.o. male with past medical history as listed below who is followed in this office for history of multiple and advanced adenomatous colon polyps and GERD complicated by peptic stricture.  He also has a history of hepatitis C which was successfully treated with Harvoni and that he is annually followed by the liver clinic at [facility]  Haron presents today with a chief complaint of worsening intermittent solid food dysphagia over the past 4 months.  He does continue on omeprazole for his chronic GERD.  As long as he is compliant with his medication, he has no reflux symptoms.  He does request medication refill.  He is sad to report that he lost his wife, Pamala Hurry, last November the lung cancer.  Is been a difficult time.  He has had difficulty eating.  He has had weight loss.  His last colonoscopy was July 2018.  4 polyps at that time.  Follow-up in 3 years recommended.  He is interested in proceeding with his surveillance examination.  Review of blood work from May 20, 2019 shows normal comprehensive metabolic panel and normal CBC except for mildly depressed hemoglobin at 12.9.  Relevant imaging includes abdominal ultrasound of the right upper quadrant November 2020 for [facility] screening.  He was found to have changes consistent with cirrhosis but no mass.  Incidental cholelithiasis.  He has not been vaccinated to Covid  REVIEW OF SYSTEMS:  All non-GI ROS negative unless otherwise stated in the HPI except for arthritis, back pain, hearing problems, sleeping problems  Past Medical History:  Diagnosis Date  . Arthritis   . Chronic back pain    buldging disc  . Chronic neck pain    ruptured disc  . Cough    hx smoking  . Diverticulosis   . Esophageal stricture   . GERD (gastroesophageal reflux disease)    takes Prilosec daily  . H/O hiatal hernia   . Hearing loss   . Hepatitis-C 2017  . Hiatal hernia   . History of colon polyps   . Insomnia    takes  Elavil nightly  . 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 Stroke [facility])     Past Surgical History:  Procedure Laterality Date  . ANTERIOR CERVICAL DECOMP/DISCECTOMY FUSION  04/12/2011   Procedure: ANTERIOR CERVICAL DECOMPRESSION/DISCECTOMY FUSION 2 LEVELS;  Surgeon: Peggyann Shoals, MD;  Location: [place] NEURO ORS;  Service: Neurosurgery;  Laterality: N/A;  exploration of Cervical four - seven  fusion with redo Cervical six- seven, cervical three- four anterior cervical decompression with fusion interbody prothesis plating and bonegraft and C34 anterior cervical decompression with inte  . APPENDECTOMY     at age 27  . CARDIAC CATHETERIZATION  06/03/04  . COLONOSCOPY    . EP IMPLANTABLE DEVICE N/A 09/14/2015   Procedure: Loop Recorder Insertion;  Surgeon: Evans Lance, MD;  Location: [place] CV LAB;  Service: Cardiovascular;  Laterality: N/A;  . HAND SURGERY Right 2005   right  . INNER EAR SURGERY Bilateral    bil;titanium in both ears  . lower back surgery  2009   had plates and screws inserted  . NECK SURGERY  2009   had insertion of  plates and screws  . TEE WITHOUT CARDIOVERSION N/A 09/14/2015   Procedure: TRANSESOPHAGEAL ECHOCARDIOGRAM (TEE);  Surgeon: Thayer Headings, MD;  Location: [place];  Service: Cardiovascular;  Laterality: N/A;    Social History Sherrell Farish  reports that he quit  smoking about 17 years ago. His smoking use included cigarettes. He has a 41.00 pack-year smoking history. He has never used smokeless tobacco. He reports previous alcohol use of about 1.0 standard drinks of alcohol per week. He reports that he does not use drugs.  family history includes Cancer in his brother; Colon cancer in his father, maternal grandfather, and maternal grandmother; Healthy in his daughter and son; Heart disease in his maternal uncle; Other in his mother; Stroke in his maternal uncle.  Allergies  Allergen Reactions  . Percocet [Oxycodone-Acetaminophen] Other (See Comments)     Hallucinations, inability to sleep.       PHYSICAL EXAMINATION: Vital signs: BP (!) 104/58   Pulse (!) 54   Temp 97.8 F (36.6 C)   Ht 5\' 8"  (1.727 m)   Wt 156 lb (70.8 kg)   BMI 23.72 kg/m   Constitutional: generally well-appearing, no acute distress.  Hearing aids present Psychiatric: alert and oriented x3, cooperative Eyes: extraocular movements intact, anicteric, conjunctiva pink Mouth: oral pharynx moist, no lesions Neck: supple no lymphadenopathy Cardiovascular: heart regular rate and rhythm, no murmur Lungs: clear to auscultation bilaterally Abdomen: soft, nontender, nondistended, no obvious ascites, no peritoneal signs, normal bowel sounds, no organomegaly Rectal: Deferred until colonoscopy Extremities: no clubbing, cyanosis, or lower extremity edema bilaterally Skin: no lesions on visible extremities Neuro: No focal deficits.  Cranial nerves intact  ASSESSMENT:  1.  Chronic GERD complicated by peptic stricture.  Classic reflux symptoms controlled with and requiring daily PPI in the form of omeprazole. 2.  Recurrent dysphagia over the past 4 months.  Intermittent solid.  Likely recurrent peptic stricture 3.  History of multiple and advanced adenomatous colon polyps.  Due for surveillance 4.  History of hepatitis C status post successful treatment with Harvoni.  Most recent laboratories and ultrasound unremarkable.  Followed at the liver clinic, [place]. 5.  Incidental cholelithiasis 6.  Situational weight loss (related to passing of his wife) 7.  Has not been vaccinated to Covid, but is interested at this point  PLAN:  1.  Reflux precautions 2.  Refill omeprazole 20 mg daily.  Medication risks reviewed 3.  Schedule upper endoscopy with esophageal dilation.The nature of the procedure, as well as the risks, benefits, and alternatives were carefully and thoroughly reviewed with the patient. Ample time for discussion and questions allowed. The patient understood, was  satisfied, and agreed to proceed. 4.  Schedule colonoscopy with polypectomy if indicated.The nature of the procedure, as well as the risks, benefits, and alternatives were carefully and thoroughly reviewed with the patient. Ample time for discussion and questions allowed. The patient understood, was satisfied, and agreed to proceed. 5.  Continue ongoing follow-up with [facility] liver clinic. 6.  Encouraged him to get the Covid vaccination.  We have provided him information to assist with the process. A total time of 40 minutes was spent preparing to see the patient, reviewing outside laboratory test and x-rays, obtaining comprehensive history, performing comprehensive physical examination, counseling the patient regarding his above listed issues, ordering medications and advanced procedures as noted, assisting with his interest in being vaccinated to Covid, and documenting clinical information in the health record

## 2019-07-12 ENCOUNTER — Ambulatory Visit: Payer: Medicare Other | Attending: Internal Medicine

## 2019-07-12 DIAGNOSIS — Z23 Encounter for immunization: Secondary | ICD-10-CM

## 2019-07-12 NOTE — Progress Notes (Signed)
   Covid-19 Vaccination Clinic  Name:  Cameron Roman    MRN: [id number] DOB: 10/07/1950  07/12/2019  [name] was observed post Covid-19 immunization for 15 minutes without incident. He was provided with Vaccine Information Sheet and instruction to access the V-Safe system.   [name] was instructed to call 911 with any severe reactions post vaccine: Marland Kitchen Difficulty breathing  . Swelling of face and throat  . A fast heartbeat  . A bad rash all over body  . Dizziness and weakness   Immunizations Administered    Name Date Dose VIS Date Route   Moderna COVID-19 Vaccine 07/12/2019  8:06 AM 0.5 mL 02/2019 Intramuscular   Manufacturer: Moderna   Lot: [id number]   [place]: [id number]

## 2019-07-18 ENCOUNTER — Other Ambulatory Visit: Payer: Self-pay | Admitting: Nurse Practitioner

## 2019-07-18 ENCOUNTER — Other Ambulatory Visit (HOSPITAL_COMMUNITY): Payer: Self-pay | Admitting: Nurse Practitioner

## 2019-07-18 DIAGNOSIS — K7460 Unspecified cirrhosis of liver: Secondary | ICD-10-CM

## 2019-07-18 DIAGNOSIS — K7469 Other cirrhosis of liver: Secondary | ICD-10-CM | POA: Diagnosis not present

## 2019-07-25 ENCOUNTER — Other Ambulatory Visit: Payer: Self-pay

## 2019-07-25 ENCOUNTER — Ambulatory Visit (HOSPITAL_COMMUNITY)
Admission: RE | Admit: 2019-07-25 | Discharge: 2019-07-25 | Disposition: A | Discharge status: Home / Self Care | Payer: Medicare Other | Source: Ambulatory Visit | Attending: Nurse Practitioner | Admitting: Nurse Practitioner

## 2019-07-25 DIAGNOSIS — K7460 Unspecified cirrhosis of liver: Secondary | ICD-10-CM | POA: Diagnosis not present

## 2019-07-31 DIAGNOSIS — R030 Elevated blood-pressure reading, without diagnosis of hypertension: Secondary | ICD-10-CM | POA: Diagnosis not present

## 2019-07-31 DIAGNOSIS — M5416 Radiculopathy, lumbar region: Secondary | ICD-10-CM | POA: Diagnosis not present

## 2019-07-31 DIAGNOSIS — M545 Low back pain: Secondary | ICD-10-CM | POA: Diagnosis not present

## 2019-08-14 DIAGNOSIS — R030 Elevated blood-pressure reading, without diagnosis of hypertension: Secondary | ICD-10-CM | POA: Diagnosis not present

## 2019-08-14 DIAGNOSIS — M47816 Spondylosis without myelopathy or radiculopathy, lumbar region: Secondary | ICD-10-CM | POA: Diagnosis not present

## 2019-08-14 DIAGNOSIS — M48061 Spinal stenosis, lumbar region without neurogenic claudication: Secondary | ICD-10-CM | POA: Diagnosis not present

## 2019-08-14 DIAGNOSIS — Z135 Encounter for screening for eye and ear disorders: Secondary | ICD-10-CM | POA: Diagnosis not present

## 2019-08-14 DIAGNOSIS — M545 Low back pain: Secondary | ICD-10-CM | POA: Diagnosis not present

## 2019-08-14 DIAGNOSIS — M5416 Radiculopathy, lumbar region: Secondary | ICD-10-CM | POA: Diagnosis not present

## 2019-08-15 ENCOUNTER — Ambulatory Visit: Payer: Medicare Other | Attending: Internal Medicine

## 2019-08-15 ENCOUNTER — Ambulatory Visit: Payer: Medicare Other

## 2019-08-15 DIAGNOSIS — Z23 Encounter for immunization: Secondary | ICD-10-CM

## 2019-08-15 NOTE — Progress Notes (Signed)
   Covid-19 Vaccination Clinic  Name:  Rhyker Silversmith    MRN: [id number] DOB: October 25, 1950  08/15/2019  Mr. Buss was observed post Covid-19 immunization for 15 minutes without incident. He was provided with Vaccine Information Sheet and instruction to access the V-Safe system.   Mr. Hickling was instructed to call 911 with any severe reactions post vaccine: Marland Kitchen Difficulty breathing  . Swelling of face and throat  . A fast heartbeat  . A bad rash all over body  . Dizziness and weakness   Immunizations Administered    Name Date Dose VIS Date Route   Moderna COVID-19 Vaccine 08/15/2019  9:00 AM 0.5 mL 02/2019 Intramuscular   Manufacturer: Moderna   Lot: [id number]   [place]: [id number]

## 2019-08-21 ENCOUNTER — Encounter: Payer: Self-pay | Admitting: Internal Medicine

## 2019-08-22 DIAGNOSIS — M545 Low back pain: Secondary | ICD-10-CM | POA: Diagnosis not present

## 2019-08-26 ENCOUNTER — Ambulatory Visit (AMBULATORY_SURGERY_CENTER): Payer: Medicare Other | Admitting: Internal Medicine

## 2019-08-26 ENCOUNTER — Encounter: Payer: Self-pay | Admitting: Internal Medicine

## 2019-08-26 ENCOUNTER — Other Ambulatory Visit: Payer: Self-pay

## 2019-08-26 VITALS — BP 133/80 | HR 76 | Temp 98.4°F | Resp 14 | Ht 68.0 in | Wt 156.0 lb

## 2019-08-26 DIAGNOSIS — K222 Esophageal obstruction: Secondary | ICD-10-CM | POA: Diagnosis not present

## 2019-08-26 DIAGNOSIS — K449 Diaphragmatic hernia without obstruction or gangrene: Secondary | ICD-10-CM | POA: Diagnosis not present

## 2019-08-26 DIAGNOSIS — K219 Gastro-esophageal reflux disease without esophagitis: Secondary | ICD-10-CM | POA: Diagnosis not present

## 2019-08-26 DIAGNOSIS — R1310 Dysphagia, unspecified: Secondary | ICD-10-CM

## 2019-08-26 DIAGNOSIS — M1990 Unspecified osteoarthritis, unspecified site: Secondary | ICD-10-CM | POA: Diagnosis not present

## 2019-08-26 DIAGNOSIS — Z86010 Personal history of colonic polyps: Secondary | ICD-10-CM | POA: Diagnosis not present

## 2019-08-26 MED ORDER — SODIUM CHLORIDE 0.9 % IV SOLN
500.0000 mL | Freq: Once | INTRAVENOUS | Status: DC
Start: 2019-08-26 — End: 2019-11-21

## 2019-08-26 NOTE — Progress Notes (Signed)
Report to PACU, RN, vss, BBS= Clear.  

## 2019-08-26 NOTE — Progress Notes (Signed)
Called to room to assist during endoscopic procedure.  Patient ID and intended procedure confirmed with present staff. Received instructions for my participation in the procedure from the performing physician.  

## 2019-08-26 NOTE — Progress Notes (Signed)
Pt's states no medical or surgical changes since previsit or office visit.  Temp- June Vitals- Courtney 

## 2019-08-26 NOTE — Patient Instructions (Signed)
Impression/Recommendations:  Dilation diet handout given to patient.   Diverticulosis handout given to patient. Hemorrhoid handout given to patient.  Repeat colonoscopy in 5 years for surveillance.  Resume previous diet. Continue present medications.  YOU HAD AN ENDOSCOPIC PROCEDURE TODAY AT [facility]:   Refer to the procedure report that was given to you for any specific questions about what was found during the examination.  If the procedure report does not answer your questions, please call your gastroenterologist to clarify.  If you requested that your care partner not be given the details of your procedure findings, then the procedure report has been included in a sealed envelope for you to review at your convenience later.  YOU SHOULD EXPECT: Some feelings of bloating in the abdomen. Passage of more gas than usual.  Walking can help get rid of the air that was put into your GI tract during the procedure and reduce the bloating. If you had a lower endoscopy (such as a colonoscopy or flexible sigmoidoscopy) you may notice spotting of blood in your stool or on the toilet paper. If you underwent a bowel prep for your procedure, you may not have a normal bowel movement for a few days.  Please Note:  You might notice some irritation and congestion in your nose or some drainage.  This is from the oxygen used during your procedure.  There is no need for concern and it should clear up in a day or so.  SYMPTOMS TO REPORT IMMEDIATELY:   Following lower endoscopy (colonoscopy or flexible sigmoidoscopy):  Excessive amounts of blood in the stool  Significant tenderness or worsening of abdominal pains  Swelling of the abdomen that is new, acute  Fever of 100F or higher   Following upper endoscopy (EGD)  Vomiting of blood or coffee ground material  New chest pain or pain under the shoulder blades  Painful or persistently difficult swallowing  New shortness of breath  Fever  of 100F or higher  Black, tarry-looking stools  For urgent or emergent issues, a gastroenterologist can be reached at any hour by calling [phone]. Do not use MyChart messaging for urgent concerns.    DIET:  We do recommend a small meal at first, but then you may proceed to your regular diet.  Drink plenty of fluids but you should avoid alcoholic beverages for 24 hours.  ACTIVITY:  You should plan to take it easy for the rest of today and you should NOT DRIVE or use heavy machinery until tomorrow (because of the sedation medicines used during the test).    FOLLOW UP: Our staff will call the number listed on your records 48-72 hours following your procedure to check on you and address any questions or concerns that you may have regarding the information given to you following your procedure. If we do not reach you, we will leave a message.  We will attempt to reach you two times.  During this call, we will ask if you have developed any symptoms of COVID 19. If you develop any symptoms (ie: fever, flu-like symptoms, shortness of breath, cough etc.) before then, please call [phone].  If you test positive for Covid 19 in the 2 weeks post procedure, please call and report this information to [place].    If any biopsies were taken you will be contacted by phone or by letter within the next 1-3 weeks.  Please call us at [phone] if you have not heard about the biopsies in  3 weeks.    SIGNATURES/CONFIDENTIALITY: You and/or your care partner have signed paperwork which will be entered into your electronic medical record.  These signatures attest to the fact that that the information above on your After Visit Summary has been reviewed and is understood.  Full responsibility of the confidentiality of this discharge information lies with you and/or your care-partner.

## 2019-08-26 NOTE — Op Note (Signed)
[place] Patient Name: Cameron Roman Procedure Date: 08/26/2019 8:46 AM MRN: [id number] Endoscopist: Docia Chuck. Henrene Pastor , MD Age: 69 Referring MD:  Date of Birth: [birth year] Gender: Male Account #: [id number] Procedure:                Upper GI endoscopy with [facility] dilation of the                            esophagus. 16 [place] Indications:              Dysphagia, Therapeutic procedure, Esophageal reflux Medicines:                Monitored Anesthesia Care Procedure:                Pre-Anesthesia Assessment:                           - Prior to the procedure, a History and Physical                            was performed, and patient medications and                            allergies were reviewed. The patient's tolerance of                            previous anesthesia was also reviewed. The risks                            and benefits of the procedure and the sedation                            options and risks were discussed with the patient.                            All questions were answered, and informed consent                            was obtained. Prior Anticoagulants: The patient has                            taken no previous anticoagulant or antiplatelet                            agents. ASA Grade Assessment: II - A patient with                            mild systemic disease. After reviewing the risks                            and benefits, the patient was deemed in                            satisfactory condition to undergo the procedure.  After obtaining informed consent, the endoscope was                            passed under direct vision. Throughout the                            procedure, the patient's blood pressure, pulse, and                            oxygen saturations were monitored continuously. The                            Endoscope was introduced through the mouth, and                            advanced  to the second part of duodenum. The upper                            GI endoscopy was accomplished without difficulty.                            The patient tolerated the procedure well. Scope In: Scope Out: Findings:                 One benign-appearing, intrinsic moderate stenosis                            was found 42 cm from the incisors. This stenosis                            measured 1.5 cm (inner diameter). After completing                            the endoscopic survey, the scope was withdrawn.                            Dilation was performed with a Maloney dilator with                            no resistance at 25 Fr.                           The exam of the esophagus was otherwise normal.                           The stomach was normal save small hiatal hernia.                           The examined duodenum was normal.                           The cardia and gastric fundus were normal on  retroflexion. Complications:            No immediate complications. Estimated Blood Loss:     Estimated blood loss: none. Impression:               - Benign-appearing esophageal stenosis. Dilated.                           - Normal stomach.                           - Normal examined duodenum.                           - No specimens collected. Recommendation:           - Patient has a contact number available for                            emergencies. The signs and symptoms of potential                            delayed complications were discussed with the                            patient. Return to normal activities tomorrow.                            Written discharge instructions were provided to the                            patient.                           -Post dilation diet.                           - Continue present medications. Docia Chuck. Henrene Pastor, MD 08/26/2019 9:16:06 AM This report has been signed electronically.

## 2019-08-26 NOTE — Op Note (Signed)
[place] Patient Name: Cameron Roman Procedure Date: 08/26/2019 8:47 AM MRN: [id number] Endoscopist: Docia Chuck. Henrene Pastor , MD Age: 69 Referring MD:  Date of Birth: 11/25/1950 Gender: Male Account #: [id number] Procedure:                Colonoscopy Indications:              High risk colon cancer surveillance: Personal                            history of adenoma (10 mm or greater in size), High                            risk colon cancer surveillance: Personal history of                            multiple (3 or more) adenomas. Previous                            examinations 2008, 2011, 2013, 2018 Medicines:                Monitored Anesthesia Care Procedure:                Pre-Anesthesia Assessment:                           - Prior to the procedure, a History and Physical                            was performed, and patient medications and                            allergies were reviewed. The patient's tolerance of                            previous anesthesia was also reviewed. The risks                            and benefits of the procedure and the sedation                            options and risks were discussed with the patient.                            All questions were answered, and informed consent                            was obtained. Prior Anticoagulants: The patient has                            taken no previous anticoagulant or antiplatelet                            agents. ASA Grade Assessment: II - A patient with  mild systemic disease. After reviewing the risks                            and benefits, the patient was deemed in                            satisfactory condition to undergo the procedure.                           After obtaining informed consent, the colonoscope                            was passed under direct vision. Throughout the                            procedure, the patient's blood pressure,  pulse, and                            oxygen saturations were monitored continuously. The                            Colonoscope was introduced through the anus and                            advanced to the the cecum, identified by                            appendiceal orifice and ileocecal valve. The                            ileocecal valve, appendiceal orifice, and rectum                            were photographed. The quality of the bowel                            preparation was excellent. The colonoscopy was                            performed without difficulty. The patient tolerated                            the procedure well. The bowel preparation used was                            SUPREP via split dose instruction. Scope In: 8:52:40 AM Scope Out: 9:03:34 AM Scope Withdrawal Time: 0 hours 8 minutes 16 seconds  Total Procedure Duration: 0 hours 10 minutes 54 seconds  Findings:                 A few small-mouthed diverticula were found in the                            sigmoid colon and ascending colon.  Internal hemorrhoids were found during                            retroflexion. The hemorrhoids were moderate.                           The exam was otherwise without abnormality on                            direct and retroflexion views. Complications:            No immediate complications. Estimated blood loss:                            None. Estimated Blood Loss:     Estimated blood loss: none. Impression:               - Diverticulosis in the sigmoid colon and in the                            ascending colon.                           - Internal hemorrhoids.                           - The examination was otherwise normal on direct                            and retroflexion views.                           - No specimens collected. Recommendation:           - Repeat colonoscopy in 5 years for surveillance.                           -  Patient has a contact number available for                            emergencies. The signs and symptoms of potential                            delayed complications were discussed with the                            patient. Return to normal activities tomorrow.                            Written discharge instructions were provided to the                            patient.                           - Resume previous diet.                           -  Continue present medications. Docia Chuck. Henrene Pastor, MD 08/26/2019 9:06:52 AM This report has been signed electronically.

## 2019-08-26 NOTE — Progress Notes (Signed)
Pt. Was sleeping when Dr. Henrene Pastor came to the RR to give his report.  Pt. Given the opportunity to wait for consultation with Dr. Henrene Pastor until the next procedure was over.  Pt. Declined.

## 2019-08-28 ENCOUNTER — Telehealth: Payer: Self-pay

## 2019-08-28 NOTE — Telephone Encounter (Signed)
  Follow up Call-  Call back number 08/26/2019  Post procedure Call Back phone  # [phone]  Permission to leave phone message Yes  Some recent data might be hidden     Patient questions:  Do you have a fever, pain , or abdominal swelling? No. Pain Score  0 *  Have you tolerated food without any problems? Yes.    Have you been able to return to your normal activities? Yes.    Do you have any questions about your discharge instructions: Diet   No. Medications  No. Follow up visit  No.  Do you have questions or concerns about your Care? No.  Actions: * If pain score is 4 or above: No action needed, pain <4.  1. Have you developed a fever since your procedure? no  2.   Have you had an respiratory symptoms (SOB or cough) since your procedure? no  3.   Have you tested positive for COVID 19 since your procedure no  4.   Have you had any family members/close contacts diagnosed with the COVID 19 since your procedure?  no   If yes to any of these questions please route to Joylene John, RN and Erenest Rasher, RN

## 2019-09-11 ENCOUNTER — Other Ambulatory Visit: Payer: Self-pay | Admitting: Neurosurgery

## 2019-09-11 DIAGNOSIS — R29898 Other symptoms and signs involving the musculoskeletal system: Secondary | ICD-10-CM | POA: Diagnosis not present

## 2019-09-11 DIAGNOSIS — M48061 Spinal stenosis, lumbar region without neurogenic claudication: Secondary | ICD-10-CM | POA: Diagnosis not present

## 2019-09-11 DIAGNOSIS — M5416 Radiculopathy, lumbar region: Secondary | ICD-10-CM | POA: Diagnosis not present

## 2019-09-11 DIAGNOSIS — R030 Elevated blood-pressure reading, without diagnosis of hypertension: Secondary | ICD-10-CM | POA: Diagnosis not present

## 2019-09-24 NOTE — Progress Notes (Signed)
Your procedure is scheduled on Friday, July 16th.             Report to [facility] Main Entrance "A" at 5:30 A.M., and check in at the Admitting office.             Call this number if you have problems the morning of surgery:             [phone]  Call [phone] if you have any questions prior to your surgery date Monday-Friday 8am-4pm              Remember:             Do not eat or drink after midnight the night before your surgery              Take these medicines the morning of surgery with A SIP OF WATER  fluticasone (FLONASE)/nasal spray gabapentin (NEURONTIN)  HYDROcodone-acetaminophen (NORCO)  omeprazole (PRILOSEC)  tamsulosin (FLOMAX)   As of today, STOP taking any Aspirin (unless otherwise instructed by your surgeon) Aleve, Naproxen, Ibuprofen, Motrin, Advil, Goody's, BC's, all herbal medications, fish oil, and all vitamins.  Do not wear jewelry. Do notwear lotions, powders, colognes, or deodorant. Men may shave face and neck. Do notbring valuables to the hospital. [facility] is not responsible for any belongings or valuables.  Do NOT Smoke (Tobacco/Vaping) or drink Alcohol 24 hours prior to your procedure If you use a CPAP at night, you may bring all equipment for your overnight stay.  Contacts, glasses, dentures or bridgework may not be worn into surgery.    For patients admitted to the hospital, discharge time will be determined by your treatment team.  Patients discharged the day of surgery will not be allowed to drive home, and someone needs to stay with them for 24 hours.  Special instructions:   [place]- Preparing For Surgery  Before surgery, you can play an important role. Because skin is not sterile, your skin needs to be as free of germs as possible. You can reduce the number of germs on your skin by washing with CHG (chlorahexidine gluconate) Soap before surgery.  CHG is an  antiseptic cleaner which kills germs and bonds with the skin to continue killing germs even after washing.    Oral Hygiene is also important to reduce your risk of infection.  Remember - BRUSH YOUR TEETH THE MORNING OF SURGERY WITH YOUR REGULAR TOOTHPASTE  Please do not use if you have an allergy to CHG or antibacterial soaps. If your skin becomes reddened/irritated stop using the CHG.  Do not shave (including legs and underarms) for at least 48 hours prior to first CHG shower. It is OK to shave your face.  Please follow these instructions carefully.                                                                                                                               1.  Shower the NIGHT BEFORE SURGERY and the MORNING OF SURGERY with CHG Soap.   2. If you chose to wash your hair, wash your hair first as usual with your normal shampoo.  3. After you shampoo, rinse your hair and body thoroughly to remove the shampoo.  4. Use CHG as you would any other liquid soap. You can apply CHG directly to the skin and wash gently with a scrungie or a clean washcloth.   5. Apply the CHG Soap to your body ONLY FROM THE NECK DOWN.  Do not use on open wounds or open sores. Avoid contact with your eyes, ears, mouth and genitals (private parts). Wash Face and genitals (private parts)  with your normal soap.   6. Wash thoroughly, paying special attention to the area where your surgery will be performed.  7. Thoroughly rinse your body with warm water from the neck down.  8. DO NOT shower/wash with your normal soap after using and rinsing off the CHG Soap.  9. Pat yourself dry with a CLEAN TOWEL.  10. Wear CLEAN PAJAMAS to bed the night before surgery  11. Place CLEAN SHEETS on your bed the night of your first shower and DO NOT SLEEP WITH PETS.   Day of Surgery: Wear Clean/Comfortable clothing the morning of surgery Do notapply any deodorants/lotions.  Remember to brush your teeth  WITH YOUR REGULAR TOOTHPASTE.  Please read over the following fact sheets that you were given.                               Note Details  Author Cordelia Pen, RN File Time 09/24/2019 4:00 PM  Author Type Registered Nurse Status Signed  Last Editor Cordelia Pen, RN Service (none)

## 2019-09-24 NOTE — Progress Notes (Signed)
Your procedure is scheduled on Friday, July 16th.  Report to [facility] Main Entrance "A" at 5:30 A.M., and check in at the Admitting office.  Call this number if you have problems the morning of surgery:  [phone]  Call [phone] if you have any questions prior to your surgery date Monday-Friday 8am-4pm   Remember:  Do not eat or drink after midnight the night before your surgery   Take these medicines the morning of surgery with A SIP OF WATER  fluticasone (FLONASE)/nasal spray gabapentin (NEURONTIN)  HYDROcodone-acetaminophen (NORCO)  omeprazole (PRILOSEC)  tamsulosin (FLOMAX)   As of today, STOP taking any Aspirin (unless otherwise instructed by your surgeon) Aleve, Naproxen, Ibuprofen, Motrin, Advil, Goody's, BC's, all herbal medications, fish oil, and all vitamins.             Do not wear jewelry.            Do not wear lotions, powders, colognes, or deodorant.            Men may shave face and neck.            Do not bring valuables to the hospital.            [facility] is not responsible for any belongings or valuables.  Do NOT Smoke (Tobacco/Vaping) or drink Alcohol 24 hours prior to your procedure If you use a CPAP at night, you may bring all equipment for your overnight stay.   Contacts, glasses, dentures or bridgework may not be worn into surgery.      For patients admitted to the hospital, discharge time will be determined by your treatment team.   Patients discharged the day of surgery will not be allowed to drive home, and someone needs to stay with them for 24 hours.  Special instructions:   [place]- Preparing For Surgery  Before surgery, you can play an important role. Because skin is not sterile, your skin needs to be as free of germs as possible. You can reduce the number of germs on your skin by washing with CHG (chlorahexidine gluconate) Soap before surgery.  CHG is an antiseptic cleaner which kills germs and bonds with the skin to continue  killing germs even after washing.    Oral Hygiene is also important to reduce your risk of infection.  Remember - BRUSH YOUR TEETH THE MORNING OF SURGERY WITH YOUR REGULAR TOOTHPASTE  Please do not use if you have an allergy to CHG or antibacterial soaps. If your skin becomes reddened/irritated stop using the CHG.  Do not shave (including legs and underarms) for at least 48 hours prior to first CHG shower. It is OK to shave your face.  Please follow these instructions carefully.   1. Shower the NIGHT BEFORE SURGERY and the MORNING OF SURGERY with CHG Soap.   2. If you chose to wash your hair, wash your hair first as usual with your normal shampoo.  3. After you shampoo, rinse your hair and body thoroughly to remove the shampoo.  4. Use CHG as you would any other liquid soap. You can apply CHG directly to the skin and wash gently with a scrungie or a clean washcloth.   5. Apply the CHG Soap to your body ONLY FROM THE NECK DOWN.  Do not use on open wounds or open sores. Avoid contact with your eyes, ears, mouth and genitals (private parts). Wash Face and genitals (private parts)  with your normal soap.   6. Wash thoroughly,  paying special attention to the area where your surgery will be performed.  7. Thoroughly rinse your body with warm water from the neck down.  8. DO NOT shower/wash with your normal soap after using and rinsing off the CHG Soap.  9. Pat yourself dry with a CLEAN TOWEL.  10. Wear CLEAN PAJAMAS to bed the night before surgery  11. Place CLEAN SHEETS on your bed the night of your first shower and DO NOT SLEEP WITH PETS.   Day of Surgery: Wear Clean/Comfortable clothing the morning of surgery Do not apply any deodorants/lotions.   Remember to brush your teeth WITH YOUR REGULAR TOOTHPASTE.   Please read over the following fact sheets that you were given.

## 2019-09-25 ENCOUNTER — Other Ambulatory Visit: Payer: Self-pay

## 2019-09-25 ENCOUNTER — Other Ambulatory Visit (HOSPITAL_COMMUNITY)
Admission: RE | Admit: 2019-09-25 | Discharge: 2019-09-25 | Disposition: A | Discharge status: Home / Self Care | Payer: Medicare Other | Source: Ambulatory Visit | Attending: Neurosurgery | Admitting: Neurosurgery

## 2019-09-25 ENCOUNTER — Encounter (HOSPITAL_COMMUNITY)
Admission: RE | Admit: 2019-09-25 | Discharge: 2019-09-25 | Disposition: A | Discharge status: Home / Self Care | Payer: Medicare Other | Source: Ambulatory Visit | Attending: Neurosurgery | Admitting: Neurosurgery

## 2019-09-25 ENCOUNTER — Encounter (HOSPITAL_COMMUNITY): Payer: Self-pay

## 2019-09-25 DIAGNOSIS — M48061 Spinal stenosis, lumbar region without neurogenic claudication: Secondary | ICD-10-CM | POA: Diagnosis not present

## 2019-09-25 DIAGNOSIS — Z79899 Other long term (current) drug therapy: Secondary | ICD-10-CM | POA: Diagnosis not present

## 2019-09-25 DIAGNOSIS — M5116 Intervertebral disc disorders with radiculopathy, lumbar region: Secondary | ICD-10-CM | POA: Diagnosis not present

## 2019-09-25 DIAGNOSIS — Z20822 Contact with and (suspected) exposure to covid-19: Secondary | ICD-10-CM | POA: Diagnosis not present

## 2019-09-25 DIAGNOSIS — Z886 Allergy status to analgesic agent status: Secondary | ICD-10-CM | POA: Diagnosis not present

## 2019-09-25 DIAGNOSIS — Z885 Allergy status to narcotic agent status: Secondary | ICD-10-CM | POA: Diagnosis not present

## 2019-09-25 DIAGNOSIS — M4807 Spinal stenosis, lumbosacral region: Secondary | ICD-10-CM | POA: Diagnosis not present

## 2019-09-25 LAB — COMPREHENSIVE METABOLIC PANEL
ALT: 10 U/L (ref 0–44)
AST: 18 U/L (ref 15–41)
Albumin: 3.7 g/dL (ref 3.5–5.0)
Alkaline Phosphatase: 65 U/L (ref 38–126)
Anion gap: 11 (ref 5–15)
BUN: 16 mg/dL (ref 8–23)
CO2: 20 mmol/L — ABNORMAL LOW (ref 22–32)
Calcium: 9.1 mg/dL (ref 8.9–10.3)
Chloride: 105 mmol/L (ref 98–111)
Creatinine, Ser: 0.73 mg/dL (ref 0.61–1.24)
GFR calc Af Amer: >60 mL/min (ref >60)
GFR calc non Af Amer: >60 mL/min (ref >60)
Glucose, Bld: 137 mg/dL — ABNORMAL HIGH (ref 70–99)
Potassium: 4.1 mmol/L (ref 3.5–5.1)
Sodium: 136 mmol/L (ref 135–145)
Total Bilirubin: 0.7 mg/dL (ref 0.3–1.2)
Total Protein: 7.1 g/dL (ref 6.5–8.1)

## 2019-09-25 LAB — CBC
HCT: 39.2 % (ref 39.0–52.0)
Hemoglobin: 13.3 g/dL (ref 13.0–17.0)
MCH: 32.8 pg (ref 26.0–34.0)
MCHC: 33.9 g/dL (ref 30.0–36.0)
MCV: 96.6 fL (ref 80.0–100.0)
Platelets: 312 10*3/uL (ref 150–400)
RBC: 4.06 MIL/uL — ABNORMAL LOW (ref 4.22–5.81)
RDW: 14.3 % (ref 11.5–15.5)
WBC: 8.8 10*3/uL (ref 4.0–10.5)
nRBC: 0 % (ref 0.0–0.2)

## 2019-09-25 LAB — TYPE AND SCREEN
ABO/RH(D): O POS
Antibody Screen: NEGATIVE

## 2019-09-25 LAB — SARS CORONAVIRUS 2 (TAT 6-24 HRS): SARS Coronavirus 2: NEGATIVE

## 2019-09-25 LAB — SURGICAL PCR SCREEN
MRSA, PCR: NEGATIVE
Staphylococcus aureus: NEGATIVE

## 2019-09-25 NOTE — Progress Notes (Signed)
PCP - Josh Dettinger Cardiologist - Dr. Dwana Melena cardiologist after having a stroke years ago.  Has seen been discharged by Dr. Laverta Baltimore.  No longer needed treatment, according to patient.   Chest x-ray - n/a EKG - 01-22-19 Cardiac Cath - 2006   COVID TEST- 09-25-19   Anesthesia review: n/a  Patient denies shortness of breath, fever, cough and chest pain at PAT appointment   All instructions explained to the patient, with a verbal understanding of the material. Patient agrees to go over the instructions while at home for a better understanding. Patient also instructed to self quarantine after being tested for COVID-19. The opportunity to ask questions was provided.

## 2019-09-26 NOTE — Anesthesia Preprocedure Evaluation (Addendum)
Anesthesia Evaluation  Patient identified by MRN, date of birth, ID band Patient awake    Reviewed: Allergy & Precautions, NPO status , Patient's Chart, lab work & pertinent test results  History of Anesthesia Complications Negative for: history of anesthetic complications  Airway Mallampati: II  TM Distance: >3 FB Neck ROM: Full    Dental  (+) Dental Advisory Given, Edentulous Lower, Edentulous Upper   Pulmonary former smoker,    Pulmonary exam normal breath sounds clear to auscultation       Cardiovascular hypertension, Normal cardiovascular exam Rhythm:Regular Rate:Normal     Neuro/Psych  Lumbar foraminal stenosis  Neuromuscular disease CVA, No Residual Symptoms negative psych ROS   GI/Hepatic hiatal hernia, GERD  Medicated,(+) Hepatitis - (s/p treatment), C  Endo/Other  negative endocrine ROS  Renal/GU negative Renal ROS     Musculoskeletal  (+) Arthritis ,   Abdominal   Peds  Hematology negative hematology ROS (+)   Anesthesia Other Findings   Reproductive/Obstetrics                            Anesthesia Physical Anesthesia Plan  ASA: III  Anesthesia Plan: General   Post-op Pain Management:    Induction: Intravenous  PONV Risk Score and Plan: 3 and Midazolam, Dexamethasone, Ondansetron and Propofol infusion  Airway Management Planned: Oral ETT  Additional Equipment:   Intra-op Plan:   Post-operative Plan: Extubation in OR  Informed Consent: I have reviewed the patients History and Physical, chart, labs and discussed the procedure including the risks, benefits and alternatives for the proposed anesthesia with the patient or authorized representative who has indicated his/her understanding and acceptance.     Dental advisory given  Plan Discussed with: CRNA  Anesthesia Plan Comments: (2nd PIV after induction)      Anesthesia Quick Evaluation

## 2019-09-27 ENCOUNTER — Inpatient Hospital Stay (HOSPITAL_COMMUNITY): Payer: Medicare Other | Admitting: Anesthesiology

## 2019-09-27 ENCOUNTER — Inpatient Hospital Stay (HOSPITAL_COMMUNITY): Payer: Medicare Other

## 2019-09-27 ENCOUNTER — Inpatient Hospital Stay (HOSPITAL_COMMUNITY)
Admission: RE | Admit: 2019-09-27 | Discharge: 2019-09-28 | DRG: 455 | Disposition: A | Discharge status: Home / Self Care | Payer: Medicare Other | Attending: Neurosurgery | Admitting: Neurosurgery

## 2019-09-27 ENCOUNTER — Inpatient Hospital Stay (HOSPITAL_COMMUNITY)
Admission: RE | Disposition: A | Discharge status: Home / Self Care | Payer: Self-pay | Source: Home / Self Care | Attending: Neurosurgery

## 2019-09-27 ENCOUNTER — Other Ambulatory Visit: Payer: Self-pay

## 2019-09-27 ENCOUNTER — Encounter (HOSPITAL_COMMUNITY): Payer: Self-pay | Admitting: Neurosurgery

## 2019-09-27 DIAGNOSIS — Z886 Allergy status to analgesic agent status: Secondary | ICD-10-CM | POA: Diagnosis not present

## 2019-09-27 DIAGNOSIS — K222 Esophageal obstruction: Secondary | ICD-10-CM

## 2019-09-27 DIAGNOSIS — M5117 Intervertebral disc disorders with radiculopathy, lumbosacral region: Secondary | ICD-10-CM | POA: Diagnosis not present

## 2019-09-27 DIAGNOSIS — M48061 Spinal stenosis, lumbar region without neurogenic claudication: Secondary | ICD-10-CM | POA: Diagnosis not present

## 2019-09-27 DIAGNOSIS — I480 Paroxysmal atrial fibrillation: Secondary | ICD-10-CM | POA: Diagnosis not present

## 2019-09-27 DIAGNOSIS — Z79899 Other long term (current) drug therapy: Secondary | ICD-10-CM | POA: Diagnosis not present

## 2019-09-27 DIAGNOSIS — Z885 Allergy status to narcotic agent status: Secondary | ICD-10-CM | POA: Diagnosis not present

## 2019-09-27 DIAGNOSIS — Z981 Arthrodesis status: Secondary | ICD-10-CM | POA: Diagnosis not present

## 2019-09-27 DIAGNOSIS — M4807 Spinal stenosis, lumbosacral region: Secondary | ICD-10-CM | POA: Diagnosis present

## 2019-09-27 DIAGNOSIS — Z20822 Contact with and (suspected) exposure to covid-19: Secondary | ICD-10-CM | POA: Diagnosis present

## 2019-09-27 DIAGNOSIS — K219 Gastro-esophageal reflux disease without esophagitis: Secondary | ICD-10-CM

## 2019-09-27 DIAGNOSIS — M5116 Intervertebral disc disorders with radiculopathy, lumbar region: Secondary | ICD-10-CM | POA: Diagnosis present

## 2019-09-27 DIAGNOSIS — Z86010 Personal history of colonic polyps: Secondary | ICD-10-CM

## 2019-09-27 DIAGNOSIS — R1310 Dysphagia, unspecified: Secondary | ICD-10-CM

## 2019-09-27 DIAGNOSIS — E785 Hyperlipidemia, unspecified: Secondary | ICD-10-CM | POA: Diagnosis not present

## 2019-09-27 DIAGNOSIS — M545 Low back pain: Secondary | ICD-10-CM | POA: Diagnosis present

## 2019-09-27 DIAGNOSIS — M4327 Fusion of spine, lumbosacral region: Secondary | ICD-10-CM | POA: Diagnosis not present

## 2019-09-27 HISTORY — PX: TRANSFORAMINAL LUMBAR INTERBODY FUSION (TLIF) WITH PEDICLE SCREW FIXATION 1 LEVEL: SHX6141

## 2019-09-27 SURGERY — TRANSFORAMINAL LUMBAR INTERBODY FUSION (TLIF) WITH PEDICLE SCREW FIXATION 1 LEVEL
Anesthesia: General | Site: Back | Laterality: Left

## 2019-09-27 MED ORDER — ROCURONIUM BROMIDE 10 MG/ML (PF) SYRINGE
PREFILLED_SYRINGE | INTRAVENOUS | Status: DC | PRN
  Administered 2019-09-27: 20 mg via INTRAVENOUS
  Administered 2019-09-27: 60 mg via INTRAVENOUS

## 2019-09-27 MED ORDER — ONDANSETRON HCL 4 MG/2ML IJ SOLN
INTRAMUSCULAR | Status: DC | PRN
  Administered 2019-09-27: 4 mg via INTRAVENOUS

## 2019-09-27 MED ORDER — LIDOCAINE-EPINEPHRINE 1 %-1:100000 IJ SOLN
INTRAMUSCULAR | Status: DC | PRN
  Administered 2019-09-27: 5 mL

## 2019-09-27 MED ORDER — DEXAMETHASONE SODIUM PHOSPHATE 10 MG/ML IJ SOLN
INTRAMUSCULAR | Status: DC | PRN
  Administered 2019-09-27: 4 mg via INTRAVENOUS

## 2019-09-27 MED ORDER — ALUM & MAG HYDROXIDE-SIMETH 200-200-20 MG/5ML PO SUSP
30.0000 mL | Freq: Four times a day (QID) | ORAL | Status: DC | PRN
  Filled 2019-09-27: qty 30

## 2019-09-27 MED ORDER — THROMBIN 20000 UNITS EX SOLR
CUTANEOUS | Status: DC | PRN
  Administered 2019-09-27: 20 mL via TOPICAL

## 2019-09-27 MED ORDER — KCL IN DEXTROSE-NACL 20-5-0.45 MEQ/L-%-% IV SOLN: INTRAVENOUS | Status: DC

## 2019-09-27 MED ORDER — PROPOFOL 10 MG/ML IV BOLUS
INTRAVENOUS | Status: DC | PRN
  Administered 2019-09-27: 150 mg via INTRAVENOUS

## 2019-09-27 MED ORDER — 0.9 % SODIUM CHLORIDE (POUR BTL) OPTIME
TOPICAL | Status: DC | PRN
  Administered 2019-09-27: 1000 mL

## 2019-09-27 MED ORDER — ROCURONIUM BROMIDE 10 MG/ML (PF) SYRINGE
PREFILLED_SYRINGE | INTRAVENOUS | Status: AC
  Filled 2019-09-27: qty 10

## 2019-09-27 MED ORDER — ORAL CARE MOUTH RINSE: 15.0000 mL | Freq: Once | OROMUCOSAL | Status: AC

## 2019-09-27 MED ORDER — MIDAZOLAM HCL 2 MG/2ML IJ SOLN
INTRAMUSCULAR | Status: AC
  Filled 2019-09-27: qty 2

## 2019-09-27 MED ORDER — MIDAZOLAM HCL 5 MG/5ML IJ SOLN
INTRAMUSCULAR | Status: DC | PRN
  Administered 2019-09-27: 2 mg via INTRAVENOUS

## 2019-09-27 MED ORDER — LACTATED RINGERS IV SOLN: INTRAVENOUS | Status: DC

## 2019-09-27 MED ORDER — METHOCARBAMOL 500 MG PO TABS
500.0000 mg | ORAL_TABLET | Freq: Four times a day (QID) | ORAL | Status: DC | PRN
  Administered 2019-09-27 – 2019-09-28 (×2): 500 mg via ORAL
  Filled 2019-09-27 (×2): qty 1

## 2019-09-27 MED ORDER — CHLORHEXIDINE GLUCONATE CLOTH 2 % EX PADS: 6.0000 | MEDICATED_PAD | Freq: Once | CUTANEOUS | Status: DC

## 2019-09-27 MED ORDER — FENTANYL CITRATE (PF) 100 MCG/2ML IJ SOLN
INTRAMUSCULAR | Status: AC
  Filled 2019-09-27: qty 2

## 2019-09-27 MED ORDER — ACETAMINOPHEN 500 MG PO TABS
500.0000 mg | ORAL_TABLET | Freq: Once | ORAL | Status: AC
  Administered 2019-09-27: 500 mg via ORAL

## 2019-09-27 MED ORDER — GABAPENTIN 300 MG PO CAPS
300.0000 mg | ORAL_CAPSULE | Freq: Three times a day (TID) | ORAL | Status: DC
  Administered 2019-09-27 – 2019-09-28 (×3): 300 mg via ORAL
  Filled 2019-09-27 (×3): qty 1

## 2019-09-27 MED ORDER — HYDROMORPHONE HCL 1 MG/ML IJ SOLN
0.5000 mg | INTRAMUSCULAR | Status: DC | PRN
  Administered 2019-09-27: 0.5 mg via INTRAVENOUS
  Filled 2019-09-27: qty 0.5

## 2019-09-27 MED ORDER — FLUTICASONE PROPIONATE 50 MCG/ACT NA SUSP
2.0000 | Freq: Every day | NASAL | Status: DC
  Filled 2019-09-27: qty 16

## 2019-09-27 MED ORDER — SODIUM CHLORIDE 0.9% FLUSH: 3.0000 mL | INTRAVENOUS | Status: DC | PRN

## 2019-09-27 MED ORDER — BUPIVACAINE LIPOSOME 1.3 % IJ SUSP
INTRAMUSCULAR | Status: DC | PRN
  Administered 2019-09-27: 20 mL

## 2019-09-27 MED ORDER — SODIUM CHLORIDE 0.9 % IV SOLN
250.0000 mL | INTRAVENOUS | Status: DC
  Administered 2019-09-27: 250 mL via INTRAVENOUS

## 2019-09-27 MED ORDER — FENTANYL CITRATE (PF) 250 MCG/5ML IJ SOLN
INTRAMUSCULAR | Status: AC
  Filled 2019-09-27: qty 5

## 2019-09-27 MED ORDER — PROMETHAZINE HCL 25 MG/ML IJ SOLN: 6.2500 mg | INTRAMUSCULAR | Status: DC | PRN

## 2019-09-27 MED ORDER — LIDOCAINE 20MG/ML (2%) 15 ML SYRINGE OPTIME
INTRAMUSCULAR | Status: DC | PRN
  Administered 2019-09-27: 40 mg via INTRAVENOUS

## 2019-09-27 MED ORDER — SUCCINYLCHOLINE CHLORIDE 200 MG/10ML IV SOSY
PREFILLED_SYRINGE | INTRAVENOUS | Status: AC
  Filled 2019-09-27: qty 10

## 2019-09-27 MED ORDER — KETAMINE HCL 50 MG/5ML IJ SOSY
PREFILLED_SYRINGE | INTRAMUSCULAR | Status: AC
  Filled 2019-09-27: qty 5

## 2019-09-27 MED ORDER — DOCUSATE SODIUM 100 MG PO CAPS
100.0000 mg | ORAL_CAPSULE | Freq: Two times a day (BID) | ORAL | Status: DC
  Administered 2019-09-27 – 2019-09-28 (×2): 100 mg via ORAL
  Filled 2019-09-27 (×2): qty 1

## 2019-09-27 MED ORDER — LIDOCAINE-EPINEPHRINE 1 %-1:100000 IJ SOLN
INTRAMUSCULAR | Status: AC
  Filled 2019-09-27: qty 1

## 2019-09-27 MED ORDER — PHENOL 1.4 % MT LIQD: 1.0000 | OROMUCOSAL | Status: DC | PRN

## 2019-09-27 MED ORDER — KETAMINE HCL 10 MG/ML IJ SOLN
INTRAMUSCULAR | Status: DC | PRN
  Administered 2019-09-27: 40 mg via INTRAVENOUS
  Administered 2019-09-27: 10 mg via INTRAVENOUS

## 2019-09-27 MED ORDER — SODIUM CHLORIDE 0.9 % IV SOLN: 500.0000 mL | Freq: Once | INTRAVENOUS | Status: DC

## 2019-09-27 MED ORDER — ONDANSETRON HCL 4 MG/2ML IJ SOLN
INTRAMUSCULAR | Status: AC
  Filled 2019-09-27: qty 2

## 2019-09-27 MED ORDER — BISACODYL 10 MG RE SUPP: 10.0000 mg | Freq: Every day | RECTAL | Status: DC | PRN

## 2019-09-27 MED ORDER — ACETAMINOPHEN 650 MG RE SUPP: 650.0000 mg | RECTAL | Status: DC | PRN

## 2019-09-27 MED ORDER — SODIUM CHLORIDE 0.9% FLUSH
3.0000 mL | Freq: Two times a day (BID) | INTRAVENOUS | Status: DC
  Administered 2019-09-27: 3 mL via INTRAVENOUS

## 2019-09-27 MED ORDER — FLEET ENEMA 7-19 GM/118ML RE ENEM: 1.0000 | ENEMA | Freq: Once | RECTAL | Status: DC | PRN

## 2019-09-27 MED ORDER — FENTANYL CITRATE (PF) 100 MCG/2ML IJ SOLN
INTRAMUSCULAR | Status: DC | PRN
  Administered 2019-09-27 (×3): 50 ug via INTRAVENOUS
  Administered 2019-09-27: 100 ug via INTRAVENOUS

## 2019-09-27 MED ORDER — METHOCARBAMOL 1000 MG/10ML IJ SOLN
500.0000 mg | Freq: Four times a day (QID) | INTRAVENOUS | Status: DC | PRN
  Filled 2019-09-27: qty 5

## 2019-09-27 MED ORDER — BUPIVACAINE HCL (PF) 0.5 % IJ SOLN
INTRAMUSCULAR | Status: AC
  Filled 2019-09-27: qty 30

## 2019-09-27 MED ORDER — HYDROCODONE-ACETAMINOPHEN 10-325 MG PO TABS: 1.0000 | ORAL_TABLET | ORAL | Status: DC | PRN

## 2019-09-27 MED ORDER — PHENYLEPHRINE HCL-NACL 10-0.9 MG/250ML-% IV SOLN
INTRAVENOUS | Status: DC | PRN
  Administered 2019-09-27: 35 ug/min via INTRAVENOUS
  Administered 2019-09-27: 25 ug/min via INTRAVENOUS

## 2019-09-27 MED ORDER — PRAVASTATIN SODIUM 10 MG PO TABS
20.0000 mg | ORAL_TABLET | Freq: Every evening | ORAL | Status: DC
  Administered 2019-09-27: 20 mg via ORAL
  Filled 2019-09-27: qty 2

## 2019-09-27 MED ORDER — LIDOCAINE 2% (20 MG/ML) 5 ML SYRINGE
INTRAMUSCULAR | Status: AC
  Filled 2019-09-27: qty 5

## 2019-09-27 MED ORDER — FENTANYL CITRATE (PF) 100 MCG/2ML IJ SOLN
25.0000 ug | INTRAMUSCULAR | Status: DC | PRN
  Administered 2019-09-27: 50 ug via INTRAVENOUS

## 2019-09-27 MED ORDER — ONDANSETRON HCL 4 MG/2ML IJ SOLN: 4.0000 mg | Freq: Four times a day (QID) | INTRAMUSCULAR | Status: DC | PRN

## 2019-09-27 MED ORDER — CEFAZOLIN SODIUM-DEXTROSE 2-4 GM/100ML-% IV SOLN
2.0000 g | INTRAVENOUS | Status: AC
  Administered 2019-09-27: 2 g via INTRAVENOUS
  Filled 2019-09-27: qty 100

## 2019-09-27 MED ORDER — PHENYLEPHRINE 40 MCG/ML (10ML) SYRINGE FOR IV PUSH (FOR BLOOD PRESSURE SUPPORT)
PREFILLED_SYRINGE | INTRAVENOUS | Status: AC
  Filled 2019-09-27: qty 10

## 2019-09-27 MED ORDER — EPHEDRINE 5 MG/ML INJ
INTRAVENOUS | Status: AC
  Filled 2019-09-27: qty 10

## 2019-09-27 MED ORDER — PROPOFOL 10 MG/ML IV BOLUS
INTRAVENOUS | Status: AC
  Filled 2019-09-27: qty 40

## 2019-09-27 MED ORDER — PANTOPRAZOLE SODIUM 40 MG IV SOLR: 40.0000 mg | Freq: Every day | INTRAVENOUS | Status: DC

## 2019-09-27 MED ORDER — CEFAZOLIN SODIUM-DEXTROSE 2-4 GM/100ML-% IV SOLN
2.0000 g | Freq: Three times a day (TID) | INTRAVENOUS | Status: AC
  Administered 2019-09-27 (×2): 2 g via INTRAVENOUS
  Filled 2019-09-27 (×2): qty 100

## 2019-09-27 MED ORDER — ONDANSETRON HCL 4 MG PO TABS: 4.0000 mg | ORAL_TABLET | Freq: Four times a day (QID) | ORAL | Status: DC | PRN

## 2019-09-27 MED ORDER — THROMBIN 20000 UNITS EX KIT
PACK | CUTANEOUS | Status: AC
  Filled 2019-09-27: qty 1

## 2019-09-27 MED ORDER — POLYETHYLENE GLYCOL 3350 17 G PO PACK: 17.0000 g | PACK | Freq: Every day | ORAL | Status: DC | PRN

## 2019-09-27 MED ORDER — TAMSULOSIN HCL 0.4 MG PO CAPS
0.4000 mg | ORAL_CAPSULE | Freq: Every day | ORAL | Status: DC
  Administered 2019-09-28: 0.4 mg via ORAL
  Filled 2019-09-27: qty 1

## 2019-09-27 MED ORDER — ACETAMINOPHEN 325 MG PO TABS: 650.0000 mg | ORAL_TABLET | ORAL | Status: DC | PRN

## 2019-09-27 MED ORDER — EPHEDRINE SULFATE-NACL 50-0.9 MG/10ML-% IV SOSY
PREFILLED_SYRINGE | INTRAVENOUS | Status: DC | PRN
  Administered 2019-09-27: 10 mg via INTRAVENOUS

## 2019-09-27 MED ORDER — THROMBIN 5000 UNITS EX SOLR
OROMUCOSAL | Status: DC | PRN
  Administered 2019-09-27: 5 mL via TOPICAL

## 2019-09-27 MED ORDER — PANTOPRAZOLE SODIUM 40 MG PO TBEC
40.0000 mg | DELAYED_RELEASE_TABLET | Freq: Every day | ORAL | Status: DC
  Administered 2019-09-28: 40 mg via ORAL
  Filled 2019-09-27: qty 1

## 2019-09-27 MED ORDER — HYDROCODONE-ACETAMINOPHEN 10-325 MG PO TABS
2.0000 | ORAL_TABLET | ORAL | Status: DC | PRN
  Administered 2019-09-27 – 2019-09-28 (×5): 2 via ORAL
  Filled 2019-09-27 (×5): qty 2

## 2019-09-27 MED ORDER — ZOLPIDEM TARTRATE 5 MG PO TABS: 5.0000 mg | ORAL_TABLET | Freq: Every evening | ORAL | Status: DC | PRN

## 2019-09-27 MED ORDER — SUGAMMADEX SODIUM 200 MG/2ML IV SOLN
INTRAVENOUS | Status: DC | PRN
  Administered 2019-09-27: 200 mg via INTRAVENOUS

## 2019-09-27 MED ORDER — MENTHOL 3 MG MT LOZG: 1.0000 | LOZENGE | OROMUCOSAL | Status: DC | PRN

## 2019-09-27 MED ORDER — ACETAMINOPHEN 500 MG PO TABS
1000.0000 mg | ORAL_TABLET | Freq: Once | ORAL | Status: DC
  Filled 2019-09-27: qty 2

## 2019-09-27 MED ORDER — THROMBIN 20000 UNITS EX SOLR
CUTANEOUS | Status: AC
  Filled 2019-09-27: qty 20000

## 2019-09-27 MED ORDER — BUPIVACAINE LIPOSOME 1.3 % IJ SUSP
20.0000 mL | Freq: Once | INTRAMUSCULAR | Status: DC
  Filled 2019-09-27: qty 20

## 2019-09-27 MED ORDER — BUPIVACAINE HCL (PF) 0.5 % IJ SOLN
INTRAMUSCULAR | Status: DC | PRN
  Administered 2019-09-27: 5 mL

## 2019-09-27 MED ORDER — CHLORHEXIDINE GLUCONATE 0.12 % MT SOLN
15.0000 mL | Freq: Once | OROMUCOSAL | Status: AC
  Administered 2019-09-27: 15 mL via OROMUCOSAL
  Filled 2019-09-27: qty 15

## 2019-09-27 MED ORDER — PHENYLEPHRINE HCL (PRESSORS) 10 MG/ML IV SOLN
INTRAVENOUS | Status: DC | PRN
  Administered 2019-09-27: 80 ug via INTRAVENOUS
  Administered 2019-09-27: 120 ug via INTRAVENOUS
  Administered 2019-09-27: 40 ug via INTRAVENOUS

## 2019-09-27 MED ORDER — DEXAMETHASONE SODIUM PHOSPHATE 10 MG/ML IJ SOLN
INTRAMUSCULAR | Status: AC
  Filled 2019-09-27: qty 1

## 2019-09-27 SURGICAL SUPPLY — 73 items
BASKET BONE COLLECTION (BASKET) ×3 IMPLANT
BLADE CLIPPER SURG (BLADE) IMPLANT
BONE CANC CHIPS 20CC PCAN1/4 (Bone Implant) ×3 IMPLANT
BUR MATCHSTICK NEURO 3.0 LAGG (BURR) ×3 IMPLANT
BUR PRECISION FLUTE 5.0 (BURR) ×3 IMPLANT
CANISTER SUCT 3000ML PPV (MISCELLANEOUS) ×3 IMPLANT
CARTRIDGE OIL MAESTRO DRILL (MISCELLANEOUS) ×1 IMPLANT
CHIPS CANC BONE 20CC PCAN1/4 (Bone Implant) ×1 IMPLANT
CNTNR URN SCR LID CUP LEK RST (MISCELLANEOUS) ×1 IMPLANT
CONT SPEC 4OZ STRL OR WHT (MISCELLANEOUS) ×3
COVER BACK TABLE 24X17X13 BIG (DRAPES) IMPLANT
COVER BACK TABLE 60X90IN (DRAPES) ×3 IMPLANT
COVER WAND RF STERILE (DRAPES) ×3 IMPLANT
DECANTER SPIKE VIAL GLASS SM (MISCELLANEOUS) ×3 IMPLANT
DERMABOND ADVANCED (GAUZE/BANDAGES/DRESSINGS) ×2
DERMABOND ADVANCED .7 DNX12 (GAUZE/BANDAGES/DRESSINGS) ×1 IMPLANT
DIFFUSER DRILL AIR PNEUMATIC (MISCELLANEOUS) ×3 IMPLANT
DRAPE C-ARM 42X72 X-RAY (DRAPES) ×3 IMPLANT
DRAPE C-ARMOR (DRAPES) ×3 IMPLANT
DRAPE LAPAROTOMY 100X72X124 (DRAPES) ×3 IMPLANT
DRAPE SURG 17X23 STRL (DRAPES) ×3 IMPLANT
DRSG OPSITE POSTOP 4X8 (GAUZE/BANDAGES/DRESSINGS) ×3 IMPLANT
DURAPREP 26ML APPLICATOR (WOUND CARE) ×3 IMPLANT
ELECT REM PT RETURN 9FT ADLT (ELECTROSURGICAL) ×3
ELECTRODE REM PT RTRN 9FT ADLT (ELECTROSURGICAL) ×1 IMPLANT
GAUZE 4X4 16PLY RFD (DISPOSABLE) IMPLANT
GAUZE SPONGE 4X4 12PLY STRL (GAUZE/BANDAGES/DRESSINGS) ×3 IMPLANT
GLOVE BIO SURGEON STRL SZ8 (GLOVE) ×6 IMPLANT
GLOVE BIOGEL PI IND STRL 8 (GLOVE) ×3 IMPLANT
GLOVE BIOGEL PI IND STRL 8.5 (GLOVE) ×2 IMPLANT
GLOVE BIOGEL PI INDICATOR 8 (GLOVE) ×6
GLOVE BIOGEL PI INDICATOR 8.5 (GLOVE) ×4
GLOVE ECLIPSE 7.5 STRL STRAW (GLOVE) ×12 IMPLANT
GLOVE ECLIPSE 8.0 STRL XLNG CF (GLOVE) ×6 IMPLANT
GLOVE EXAM NITRILE XL STR (GLOVE) IMPLANT
GOWN STRL REUS W/ TWL LRG LVL3 (GOWN DISPOSABLE) IMPLANT
GOWN STRL REUS W/ TWL XL LVL3 (GOWN DISPOSABLE) ×2 IMPLANT
GOWN STRL REUS W/TWL 2XL LVL3 (GOWN DISPOSABLE) ×9 IMPLANT
GOWN STRL REUS W/TWL LRG LVL3 (GOWN DISPOSABLE)
GOWN STRL REUS W/TWL XL LVL3 (GOWN DISPOSABLE) ×6
IMPL TLX20 11X11X31 20D (Cage) ×1 IMPLANT
KIT BASIN OR (CUSTOM PROCEDURE TRAY) ×3 IMPLANT
KIT INFUSE XX SMALL 0.7CC (Orthopedic Implant) ×3 IMPLANT
KIT POSITION SURG JACKSON T1 (MISCELLANEOUS) ×3 IMPLANT
KIT TURNOVER KIT B (KITS) ×3 IMPLANT
NEEDLE HYPO 25X1 1.5 SAFETY (NEEDLE) ×3 IMPLANT
NEEDLE SPNL 18GX3.5 QUINCKE PK (NEEDLE) IMPLANT
NS IRRIG 1000ML POUR BTL (IV SOLUTION) ×3 IMPLANT
OIL CARTRIDGE MAESTRO DRILL (MISCELLANEOUS) ×3
PACK LAMINECTOMY NEURO (CUSTOM PROCEDURE TRAY) ×3 IMPLANT
PAD ARMBOARD 7.5X6 YLW CONV (MISCELLANEOUS) ×9 IMPLANT
PATTIES SURGICAL .5 X.5 (GAUZE/BANDAGES/DRESSINGS) IMPLANT
PATTIES SURGICAL .5 X1 (DISPOSABLE) IMPLANT
PATTIES SURGICAL 1X1 (DISPOSABLE) IMPLANT
ROD RELINE-O LORD 5.5X50MM (Rod) ×6 IMPLANT
SCREW LOCK RELINE 5.5 TULIP (Screw) ×12 IMPLANT
SCREW RELINE-O POLY 7.5X45 (Screw) ×6 IMPLANT
SCREW RELINE-O POLY 7.5X50 (Screw) ×6 IMPLANT
SCREW RLINE PLY 2S 50X7.5XPA (Screw) ×2 IMPLANT
SPONGE LAP 4X18 RFD (DISPOSABLE) IMPLANT
SPONGE SURGIFOAM ABS GEL 100 (HEMOSTASIS) ×3 IMPLANT
STAPLER SKIN PROX WIDE 3.9 (STAPLE) IMPLANT
SUT VIC AB 1 CT1 18XBRD ANBCTR (SUTURE) ×2 IMPLANT
SUT VIC AB 1 CT1 8-18 (SUTURE) ×4
SUT VIC AB 2-0 CT1 18 (SUTURE) ×6 IMPLANT
SUT VIC AB 3-0 SH 8-18 (SUTURE) ×6 IMPLANT
SYR 3ML LL SCALE MARK (SYRINGE) ×6 IMPLANT
SYR 5ML LL (SYRINGE) IMPLANT
TLX20 IMPLANT 11X11X31 20D (Cage) ×3 IMPLANT
TOWEL GREEN STERILE (TOWEL DISPOSABLE) ×3 IMPLANT
TOWEL GREEN STERILE FF (TOWEL DISPOSABLE) ×3 IMPLANT
TRAY FOLEY MTR SLVR 16FR STAT (SET/KITS/TRAYS/PACK) ×3 IMPLANT
WATER STERILE IRR 1000ML POUR (IV SOLUTION) ×3 IMPLANT

## 2019-09-27 NOTE — Anesthesia Procedure Notes (Addendum)
Procedure Name: Intubation Date/Time: 09/27/2019 7:35 AM Performed by: Lowella Dell, CRNA Pre-anesthesia Checklist: Patient identified, Emergency Drugs available, Suction available and Patient being monitored Patient Re-evaluated:Patient Re-evaluated prior to induction Oxygen Delivery Method: Circle System Utilized Preoxygenation: Pre-oxygenation with 100% oxygen Induction Type: IV induction Ventilation: Mask ventilation without difficulty and Oral airway inserted - appropriate to patient size Laryngoscope Size: Mac and 4 Grade View: Grade I Tube type: Oral Tube size: 7.5 mm Number of attempts: 1 Airway Equipment and Method: Stylet Placement Confirmation: ETT inserted through vocal cords under direct vision,  positive ETCO2 and breath sounds checked- equal and bilateral Secured at: 24 cm Tube secured with: Tape Dental Injury: Teeth and Oropharynx as per pre-operative assessment  Comments: IV induction by Dr Cassadi Purdie Robert

## 2019-09-27 NOTE — Op Note (Signed)
09/27/2019  10:45 AM  PATIENT:  Cameron Roman  69 y.o. male  PRE-OPERATIVE DIAGNOSIS:  Lumbar foraminal stenosis, herniated lumbar disc, lumbar radiculopathy, lumbago  POST-OPERATIVE DIAGNOSIS:  Lumbar foraminal stenosis, herniated lumbar disc, lumbar radiculopathy, lumbago   PROCEDURE:  Procedure(s) with comments: Left Lumbar 5 Sacral 1 Transforaminal lumbar interbody fusion with exploration/removal of adjacent level hardware (Left) - 3C/RM 19 pedicle screw fixation L 5 S 1 levels, posterolateral arthrodesis   SURGEON:  Surgeon(s) and Role:    Erline Levine, MD - Primary  PHYSICIAN ASSISTANT:   ASSISTANTS: McDaniel, NP   Poteat, RN   ANESTHESIA:   general  EBL:  300 mL   BLOOD ADMINISTERED:none  DRAINS: none   LOCAL MEDICATIONS USED:  MARCAINE    and LIDOCAINE   SPECIMEN:  No Specimen  DISPOSITION OF SPECIMEN:  N/A  COUNTS:  YES  TOURNIQUET:  * No tourniquets in log *  DICTATION: Patient is 69 year old man with lumbar stenosis and previous fusion L 3-5 levels with stenosis at  L 5 S 1 and left foraminal disc herniation and foraminal stenosis at L 5 S 1 with left leg pain.  It was elected to take him to surgery for exploration of previous fusion with decompression and fusion at the L 5 S 1 level with left L 5 S 1 TLIF.   Procedure: Patient was placed in a prone position on the [place] table after smooth and uncomplicated induction of general endotracheal anesthesia. His low back was prepped and draped in usual sterile fashion with betadine scrub and DuraPrep. Area of incision was infiltrated with local lidocaine. Incision was made to the lumbodorsal fascia was incised and exposure was performed of the L 3 - L 5 spinous processes laminae facet joint and transverse processes. Previous hardware was exposed.  The previously placed hardware was exposed and removed. The bone screws were removed and appeared to be solidly in bone and there was dense bridging bone across the L 3 - 5  levels without abnormal motion suggestive of pseudoarthrosis.  Intraoperative x-ray was obtained which confirmed correct orientation with marker probes at L 5 - S 1 levels. A total left hemi-laminectomy of L 5  levels was performed with disarticulation of the facet joints and thorough decompression was performed of both L 5 ans S 1 nerve roots along with the common dural tube. Decompression was greater than would be typical for PLIF. A thorough discectomy with removal  Herniated disc material was performed. A thorough discectomy was then performed on the left with preparation of the endplates for grafting a trial spacer was placed this level. After trial sizing and utilization of sequential shavers, interspaces were packed with autograft, extra extra small BMP and 10 cc of autograft in the intrerspace.  An 11 x 11 x 31 mm TLIF spacer was placed and expanded to 50 % to restore appropriate lordosis.  The posterolateral region was extensively decorticated and pedicle probes were placed at S 1 bilaterally. Intraoperative fluoroscopy confirmed correct orientationin the AP and lateral plane. 45 x 7.5 mm pedicle screws were placed at S 1 bilaterally and 50 x 7.5 mm screws placed at L 5 bilaterally  Final x-rays demonstrated well-positioned interbody grafts and pedicle screw fixation. A 50 mm lordotic rod was placed on the right and a 50 mm rod was placed on the left locked down in situ and the posterolateral region on the right was packed with 20 cc bone allograft and 10 cc autograft on the right  from L 3 - S 1 levels. The wound was irrigated. Long-acting Marcaine was injected in the deep musculature.  Fascia was closed with 1 Vicryl sutures skin edges were reapproximated 2 and 3-0 Vicryl sutures. The wound was dressed with Dermabond and  an occlusive dressing the patient was extubated in the operating room and taken to recovery in stable satisfactory condition she tolerated traction well counts were correct at the end of  the case.   PLAN OF CARE: Admit for overnight observation  PATIENT DISPOSITION:  PACU - hemodynamically stable.   Delay start of Pharmacological VTE agent (>24hrs) due to surgical blood loss or risk of bleeding: yes

## 2019-09-27 NOTE — Evaluation (Signed)
Physical Therapy Evaluation Patient Details Name: Cameron Roman MRN: [id number] DOB: 1950-07-22 Today's Date: 09/27/2019   History of Present Illness  Pt is a 69 y.o. M with significant PMH of CVA, HTN, atrial fibrillation and prior back surgeries who presents with lumbar foraminal stenosis s/p L5-S1 TLIF.   Clinical Impression  Prior to admission, pt lives alone and is independent. He does endorse history of falling. He plans to have his daughter and 37 y.o. grandson assist him upon discharge. Pt reports post surgical back pain with improved radicular symptoms into left buttock. Pt ambulating 200 feet with a cane at a min guard assist level. Displays dynamic balance deficits. Although pt preference is to use a cane, recommended a walker at this time for improved stability/support. Pt with good recall of spinal precautions and able to don brace independently. Will trial steps tomorrow.     Follow Up Recommendations No PT follow up;Supervision for mobility/OOB    Equipment Recommendations  None recommended by PT (has walker, 3 in 1)   Recommendations for Other Services       Precautions / Restrictions Precautions Precautions: Fall;Back Precaution Booklet Issued: Yes (comment) Precaution Comments: Pt recalling 3/3 Required Braces or Orthoses: Spinal Brace Spinal Brace: Lumbar corset;Applied in sitting position Restrictions Weight Bearing Restrictions: No      Mobility  Bed Mobility Overal bed mobility: Modified Independent             General bed mobility comments: Good log roll technique, use of bed rail and HOB slightly elevated  Transfers Overall transfer level: Needs assistance Equipment used: None Transfers: Sit to/from Stand Sit to Stand: Supervision         General transfer comment: Increased time to rise from edge of bed  Ambulation/Gait Ambulation/Gait assistance: Min guard Gait Distance (Feet): 200 Feet Assistive device: Straight cane Gait  Pattern/deviations: Step-through pattern;Decreased stride length     General Gait Details: Slow pace with mild bilateral knee instability, min guard for safety. Decreased arm swing noted.  Stairs            Wheelchair Mobility    Modified Rankin (Stroke Patients Only)       Balance Overall balance assessment: Needs assistance Sitting-balance support: Feet supported Sitting balance-Leahy Scale: Good     Standing balance support: No upper extremity supported;During functional activity Standing balance-Leahy Scale: Fair                               Pertinent Vitals/Pain Pain Assessment: Faces Faces Pain Scale: Hurts little more Pain Location: back Pain Descriptors / Indicators: Grimacing;Guarding Pain Intervention(s): Monitored during session    Home Living Family/patient expects to be discharged to:: Private residence Living Arrangements: Alone Available Help at Discharge: Family;Available 24 hours/day (daughter, 51 y.o. grandson) Type of Home: House Home Access: Stairs to enter Entrance Stairs-Rails: None Entrance Stairs-Number of Steps: 2 Home Layout: One level Home Equipment: Environmental consultant - 2 wheels;Bedside commode      Prior Function Level of Independence: Independent         Comments: Very active, likes house/yard projects     Hand Dominance        Extremity/Trunk Assessment   Upper Extremity Assessment Upper Extremity Assessment: Overall WFL for tasks assessed    Lower Extremity Assessment Lower Extremity Assessment: Overall WFL for tasks assessed    Cervical / Trunk Assessment Cervical / Trunk Assessment: Other exceptions Cervical / Trunk Exceptions: s/p L5-S1 TLIF  Communication   Communication: No difficulties  Cognition Arousal/Alertness: Awake/alert Behavior During Therapy: WFL for tasks assessed/performed Overall Cognitive Status: Within Functional Limits for tasks assessed                                         General Comments      Exercises     Assessment/Plan    PT Assessment Patient needs continued PT services  PT Problem List Decreased strength;Decreased activity tolerance;Decreased balance;Decreased mobility;Pain       PT Treatment Interventions DME instruction;Gait training;Stair training;Functional mobility training;Therapeutic activities;Therapeutic exercise;Balance training;Patient/family education    PT Goals (Current goals can be found in the Care Plan section)  Acute Rehab PT Goals Patient Stated Goal: not use a walker, be active PT Goal Formulation: With patient Time For Goal Achievement: 10/11/19 Potential to Achieve Goals: Good    Frequency Min 5X/week   Barriers to discharge        Co-evaluation               AM-PAC PT "6 Clicks" Mobility  Outcome Measure Help needed turning from your back to your side while in a flat bed without using bedrails?: None Help needed moving from lying on your back to sitting on the side of a flat bed without using bedrails?: None Help needed moving to and from a bed to a chair (including a wheelchair)?: None Help needed standing up from a chair using your arms (e.g., wheelchair or bedside chair)?: None Help needed to walk in hospital room?: A Little Help needed climbing 3-5 steps with a railing? : A Little 6 Click Score: 22    End of Session Equipment Utilized During Treatment: Gait belt;Back brace Activity Tolerance: Patient tolerated treatment well Patient left: in bed;with call bell/phone within reach Nurse Communication: Mobility status PT Visit Diagnosis: Unsteadiness on feet (R26.81);Pain;History of falling (Z91.81) Pain - part of body:  (back)    Time: 6015-6153 PT Time Calculation (min) (ACUTE ONLY): 22 min   Charges:   PT Evaluation $PT Eval Low Complexity: [place], PT, DPT Acute Rehabilitation Services Pager [phone] Office [phone]   Deno Etienne 09/27/2019, 5:19 PM

## 2019-09-27 NOTE — Transfer of Care (Signed)
Immediate Anesthesia Transfer of Care Note  Patient: Cameron Roman  Procedure(s) Performed: Left Lumbar 5 Sacral 1 Transforaminal lumbar interbody fusion with exploration/removal of adjacent level hardware (Left Back)  Patient Location: PACU  Anesthesia Type:General  Level of Consciousness: drowsy  Airway & Oxygen Therapy: Patient Spontanous Breathing and Patient connected to face mask oxygen  Post-op Assessment: Report given to RN and Post -op Vital signs reviewed and stable  Post vital signs: Reviewed and stable  Last Vitals:  Vitals Value Taken Time  BP 164/78 09/27/19 1046  Temp 36.5 C 09/27/19 1045  Pulse 71 09/27/19 1049  Resp 28 09/27/19 1049  SpO2 99 % 09/27/19 1049  Vitals shown include unvalidated device data.  Last Pain:  Vitals:   09/27/19 3943  TempSrc:   PainSc: 8          Complications: No complications documented.

## 2019-09-27 NOTE — H&P (Signed)
Patient ID:   [id number] Patient: Cameron Roman  Date of Birth: 22-Jul-1950 Visit Type: Office Visit   Date: 09/11/2019 09:45 AM Provider: Marchia Meiers. Vertell Limber MD   This 69 year old male presents for back pain/injection follow-up.  HISTORY OF PRESENT ILLNESS: 1.  back pain/injection follow-up  08/22/2019 left transforaminal ESI by Dr. Maryjean Ka  Patient returns in follow-up to his injection, reporting increased lumbar pain, left leg pain into the foot.   Norco 10/325 taken t.i.d. Gabapentin 300 mg taken b.i.d. ("helps shoulder pain")  The patient did not get significant relief with pain injection and says he is worse than he was before hand  .  His strength is 4/5 in his left EHL and he has a positive straight leg raise on the left.  I have reviewed treatment options for the patient.  He is currently complaining of pain at 8/10 and says that he is miserable.  I believe that he has severe left L5 radiculopathy due to foraminal stenosis at the L5-S1 level.  I have recommended exploration of previous fusion with L5-S1 TLIF on the left      Medical/Surgical/Interim History Reviewed, no change.  Last detailed document date:05/20/2013.     PAST MEDICAL HISTORY, SURGICAL HISTORY, FAMILY HISTORY, SOCIAL HISTORY AND REVIEW OF SYSTEMS I have reviewed the patient's past medical, surgical, family and social history as well as the comprehensive review of systems as included on the [facility] history form dated 08/14/2019, which I have signed.  Family History: Reviewed, no changes.  Last detailed document date:05/20/2013.   Social History: Reviewed, no changes. Last detailed document date: 05/20/2013.    MEDICATIONS: (added, continued or stopped this visit) Started Medication Directions Instruction Stopped 05/01/2018 gabapentin 300 mg capsule TAKE (1) CAPSULE THREE TIMES DAILY.   08/17/2019 hydrocodone 10 mg-acetaminophen 325 mg tablet take 1 tablet  by oral route  every 8 - 12 hours as needed for pain   06/17/2019 Norco 10 mg-325 mg tablet take 1 tablet by oral route  every 8 hours as needed for pain   07/10/2019 Norco 10 mg-325 mg tablet take 1 tablet by oral route  every 8 hours as needed for pain      ALLERGIES: Ingredient Reaction Medication Name Comment ACETAMINOPHEN  Percocet  OXYCODONE HCL  Percocet   Reviewed, no changes.    PHYSICAL EXAM:  Vitals Date Temp F BP Pulse Ht In Wt Lb BMI BSA Pain Score 09/11/2019  127/74 69 71 152.4 21.26  8/10     IMPRESSION:  Severe left leg pain with left L5-S1 foraminal stenosis refractory to injection therapy.  The patient is unable to stand and walk any distance due to the severe and unrelenting pain.  PLAN: Proceed with exploration previous fusion with L5-S1 TLIF on the left.  Orders: Diagnostic Procedures: Assessment Procedure M54.16 Lumbar Spine- AP/Lat M54.16 Lumbar Spine- AP/Lat/Flex/Ex Instruction(s)/Education: Assessment Instruction R03.0 Lifestyle education Miscellaneous: Assessment  M48.061 LSO Brace  Completed Orders (this encounter) Order Details Reason Side Interpretation Result Initial Treatment Date Region Lumbar Spine- AP/Lat/Flex/Ex      09/11/2019 All Levels to All Levels Lifestyle education Patient will monitor and contact primary care physician if needed.        Assessment/Plan  # Detail Type Description  1. Assessment Radiculopathy, lumbar region (M54.16).     2. Assessment Lumbar foraminal stenosis (M48.061).  Plan Orders LSO Brace.     3. Assessment Left leg weakness (R29.898).     4. Assessment Elevated blood-pressure  reading, w/o diagnosis of htn (R03.0).       Pain Management Plan Pain Scale: 8/10. Method: Numeric Pain Intensity Scale. Location: back. Onset: 12/31/2006. Duration: varies. Quality: discomforting. Pain management follow-up plan  of care: Patient will continue medication management..              Provider:  Marchia Meiers. Vertell Limber MD  09/16/2019 12:44 PM    Dictation edited by: Marchia Meiers. Vertell Limber    CC Providers: Vonna Kotyk  [facility] [place],  [place]  [address]-   Matthew Weingold  [facility] [address] [place], [place] [address]-               Electronically signed by Marchia Meiers Vertell Limber MD on 09/16/2019 12:44 PM

## 2019-09-27 NOTE — Interval H&P Note (Signed)
History and Physical Interval Note:  09/27/2019 7:27 AM  Cameron Roman  has presented today for surgery, with the diagnosis of Lumbar foraminal stenosis.  The various methods of treatment have been discussed with the patient and family. After consideration of risks, benefits and other options for treatment, the patient has consented to  Procedure(s) with comments: Left Lumbar 5 Sacral 1 Transforaminal lumbar interbody fusion with exploration/removal of adjacent level hardware (Left) - 3C/RM 19 as a surgical intervention.  The patient's history has been reviewed, patient examined, no change in status, stable for surgery.  I have reviewed the patient's chart and labs.  Questions were answered to the patient's satisfaction.     Peggyann Shoals

## 2019-09-27 NOTE — Progress Notes (Signed)
Subjective: Patient reports that he is doing well and his pain level is greatly reduced from his pre surgery level. He does have some moderate incisional pain but states his pain is being managed well. Right big toe numbness is unchanged from pre surgery at this time.   Objective: Vital signs in last 24 hours: Temp:  [97.2 F (36.2 C)-97.9 F (36.6 C)] 97.8 F (36.6 C) (07/16 1244) Pulse Rate:  [66-112] 88 (07/16 1244) Resp:  [11-32] 19 (07/16 1244) BP: (130-166)/(73-105) 130/93 (07/16 1244) SpO2:  [92 %-100 %] 98 % (07/16 1244) Weight:  [68.5 kg] 68.5 kg (07/16 0618)  Intake/Output from previous day: No intake/output data recorded. Intake/Output this shift: Total I/O In: 1900 [I.V.:1900] Out: 725 [Urine:425; Blood:300]  Physical Exam: Patient is alert, conversant, and OX4. He MAEW with good stregth that is symmetric bilaterally. Dressing is CDI with no drainage, swelling, or erythema.   Lab Results: Recent Labs    09/25/19 0940  WBC 8.8  HGB 13.3  HCT 39.2  PLT 312   BMET Recent Labs    09/25/19 0940  NA 136  K 4.1  CL 105  CO2 20*  GLUCOSE 137*  BUN 16  CREATININE 0.73  CALCIUM 9.1    Studies/Results: DG Lumbar Spine 2-3 Views  Result Date: 09/27/2019 CLINICAL DATA:  Surgical fusion of L5-S1. EXAM: LUMBAR SPINE - 2-3 VIEW; DG C-ARM 1-60 MIN FLUOROSCOPY TIME:  22 seconds. COMPARISON:  None. FINDINGS: Four intraoperative fluoroscopic images were obtained of the lower lumbar spine. Patient is status post surgical posterior fusion of L5-S1 with bilateral intrapedicular screw placement and interbody fusion. IMPRESSION: Status post surgical posterior fusion of L5-S1. Electronically Signed   By: Marijo Conception M.D.   On: 09/27/2019 13:26   DG C-Arm 1-60 Min  Result Date: 09/27/2019 CLINICAL DATA:  Surgical fusion of L5-S1. EXAM: LUMBAR SPINE - 2-3 VIEW; DG C-ARM 1-60 MIN FLUOROSCOPY TIME:  22 seconds. COMPARISON:  None. FINDINGS: Four intraoperative fluoroscopic  images were obtained of the lower lumbar spine. Patient is status post surgical posterior fusion of L5-S1 with bilateral intrapedicular screw placement and interbody fusion. IMPRESSION: Status post surgical posterior fusion of L5-S1. Electronically Signed   By: Marijo Conception M.D.   On: 09/27/2019 13:26    Assessment/Plan: Patient is doing well and his symptoms have improved as compared prior to surgery. LSO brace when OOB. Encourage mobilization and ambulation.     LOS: 0 days    Peggyann Shoals, MD 09/27/2019, 3:00 PM   Patient is doing well.

## 2019-09-27 NOTE — Brief Op Note (Signed)
09/27/2019  10:45 AM  PATIENT:  Cameron Roman  69 y.o. male  PRE-OPERATIVE DIAGNOSIS:  Lumbar foraminal stenosis, herniated lumbar disc, lumbar radiculopathy, lumbago  POST-OPERATIVE DIAGNOSIS:  Lumbar foraminal stenosis, herniated lumbar disc, lumbar radiculopathy, lumbago   PROCEDURE:  Procedure(s) with comments: Left Lumbar 5 Sacral 1 Transforaminal lumbar interbody fusion with exploration/removal of adjacent level hardware (Left) - 3C/RM 19 pedicle screw fixation L 5 S 1 levels, posterolateral arthrodesis   SURGEON:  Surgeon(s) and Role:    Erline Levine, MD - Primary  PHYSICIAN ASSISTANT:   ASSISTANTS: McDaniel, NP   Poteat, RN   ANESTHESIA:   general  EBL:  300 mL   BLOOD ADMINISTERED:none  DRAINS: none   LOCAL MEDICATIONS USED:  MARCAINE    and LIDOCAINE   SPECIMEN:  No Specimen  DISPOSITION OF SPECIMEN:  N/A  COUNTS:  YES  TOURNIQUET:  * No tourniquets in log *  DICTATION: Patient is 69 year old man with lumbar stenosis and previous fusion L 3-5 levels with stenosis at  L 5 S 1 and left foraminal disc herniation and foraminal stenosis at L 5 S 1 with left leg pain.  It was elected to take him to surgery for exploration of previous fusion with decompression and fusion at the L 5 S 1 level with left L 5 S 1 TLIF.   Procedure: Patient was placed in a prone position on the [place] table after smooth and uncomplicated induction of general endotracheal anesthesia. His low back was prepped and draped in usual sterile fashion with betadine scrub and DuraPrep. Area of incision was infiltrated with local lidocaine. Incision was made to the lumbodorsal fascia was incised and exposure was performed of the L 3 - L 5 spinous processes laminae facet joint and transverse processes. Previous hardware was exposed.  The previously placed hardware was exposed and removed. The bone screws were removed and appeared to be solidly in bone and there was dense bridging bone across the L 3 - 5  levels without abnormal motion suggestive of pseudoarthrosis.  Intraoperative x-ray was obtained which confirmed correct orientation with marker probes at L 5 - S 1 levels. A total left hemi-laminectomy of L 5  levels was performed with disarticulation of the facet joints and thorough decompression was performed of both L 5 ans S 1 nerve roots along with the common dural tube. Decompression was greater than would be typical for PLIF. A thorough discectomy with removal  Herniated disc material was performed. A thorough discectomy was then performed on the left with preparation of the endplates for grafting a trial spacer was placed this level. After trial sizing and utilization of sequential shavers, interspaces were packed with autograft, extra extra small BMP and 10 cc of autograft in the intrerspace.  An 11 x 11 x 31 mm TLIF spacer was placed and expanded to 50 % to restore appropriate lordosis.  The posterolateral region was extensively decorticated and pedicle probes were placed at S 1 bilaterally. Intraoperative fluoroscopy confirmed correct orientationin the AP and lateral plane. 45 x 7.5 mm pedicle screws were placed at S 1 bilaterally and 50 x 7.5 mm screws placed at L 5 bilaterally  Final x-rays demonstrated well-positioned interbody grafts and pedicle screw fixation. A 50 mm lordotic rod was placed on the right and a 50 mm rod was placed on the left locked down in situ and the posterolateral region on the right was packed with 20 cc bone allograft and 10 cc autograft on the right  from L 3 - S 1 levels. The wound was irrigated. Long-acting Marcaine was injected in the deep musculature.  Fascia was closed with 1 Vicryl sutures skin edges were reapproximated 2 and 3-0 Vicryl sutures. The wound was dressed with Dermabond and  an occlusive dressing the patient was extubated in the operating room and taken to recovery in stable satisfactory condition she tolerated traction well counts were correct at the end of  the case.   PLAN OF CARE: Admit for overnight observation  PATIENT DISPOSITION:  PACU - hemodynamically stable.   Delay start of Pharmacological VTE agent (>24hrs) due to surgical blood loss or risk of bleeding: yes

## 2019-09-27 NOTE — Progress Notes (Signed)
Orthopedic Tech Progress Note Patient Details:  Cameron Roman [birth year] [id number] MD said patient has brace Patient ID: Cameron Roman, male   DOB: February 12, 1951, 69 y.o.   MRN: [id number]   Janit Pagan 09/27/2019, 12:40 PM

## 2019-09-27 NOTE — Anesthesia Postprocedure Evaluation (Signed)
Anesthesia Post Note  Patient: Cameron Roman  Procedure(s) Performed: Left Lumbar 5 Sacral 1 Transforaminal lumbar interbody fusion with exploration/removal of adjacent level hardware (Left Back)     Patient location during evaluation: PACU Anesthesia Type: General Level of consciousness: awake and alert, awake and oriented Pain management: pain level controlled Vital Signs Assessment: post-procedure vital signs reviewed and stable Respiratory status: spontaneous breathing, nonlabored ventilation and respiratory function stable Cardiovascular status: blood pressure returned to baseline and stable Postop Assessment: no apparent nausea or vomiting Anesthetic complications: no   No complications documented.  Last Vitals:  Vitals:   09/27/19 1244 09/27/19 1518  BP: (!) 130/93 (!) 146/79  Pulse: 88 68  Resp: 19 16  Temp: 36.6 C 36.5 C  SpO2: 98% 100%    Last Pain:  Vitals:   09/27/19 1518  TempSrc: Oral  PainSc:                  Catalina Gravel

## 2019-09-28 MED ORDER — METHOCARBAMOL 500 MG PO TABS: 500.0000 mg | ORAL_TABLET | Freq: Four times a day (QID) | ORAL | 0 refills | Status: DC | PRN

## 2019-09-28 MED ORDER — HYDROCODONE-ACETAMINOPHEN 10-325 MG PO TABS: 1.0000 | ORAL_TABLET | Freq: Three times a day (TID) | ORAL | 0 refills | Status: DC

## 2019-09-28 NOTE — Plan of Care (Signed)
Patient alert and oriented, mae's well, voiding adequate amount of urine, swallowing without difficulty, no c/o pain at time of discharge. Patient discharged home with family. Script and discharged instructions given to patient. Patient and family stated understanding of instructions given. Patient has an appointment with Dr.Stern    

## 2019-09-28 NOTE — Progress Notes (Signed)
Physical Therapy Treatment Patient Details Name: Cameron Roman MRN: [id number] DOB: Aug 14, 1950 Today's Date: 09/28/2019    History of Present Illness Pt is a 69 y.o. M with significant PMH of CVA, HTN, atrial fibrillation and prior back surgeries who presents with lumbar foraminal stenosis s/p L5-S1 TLIF.     PT Comments    Pt making excellent progress with functional mobility and tolerated stair training without difficulty this session. PT provided pt education re: back precautions, car transfers and a generalized walking program for pt to initiate upon d/c home. Plan is to d/c home today with family support.     Follow Up Recommendations  No PT follow up;Supervision for mobility/OOB     Equipment Recommendations  None recommended by PT    Recommendations for Other Services       Precautions / Restrictions Precautions Precautions: Fall;Back Precaution Comments: Pt recalling 3/3 Required Braces or Orthoses: Spinal Brace Spinal Brace: Lumbar corset;Applied in sitting position Restrictions Weight Bearing Restrictions: No    Mobility  Bed Mobility Overal bed mobility: Modified Independent             General bed mobility comments: Good log roll technique, use of bed rail and HOB slightly elevated  Transfers Overall transfer level: Needs assistance Equipment used: Rolling walker (2 wheeled) Transfers: Sit to/from Stand Sit to Stand: Supervision         General transfer comment: Increased time to rise from edge of bed  Ambulation/Gait Ambulation/Gait assistance: Supervision Gait Distance (Feet): 500 Feet Assistive device: Rolling walker (2 wheeled) Gait Pattern/deviations: Step-through pattern;Decreased stride length Gait velocity: reduced   General Gait Details: pt with slow, steady pace with use of RW; no instability or LOB, supervision for safety   Stairs Stairs: Yes Stairs assistance: Min guard Stair Management: One rail Right;Step to  pattern;Forwards Number of Stairs: 4 General stair comments: min guard for safety, no LOB or difficulties noted   Wheelchair Mobility    Modified Rankin (Stroke Patients Only)       Balance Overall balance assessment: Needs assistance Sitting-balance support: Feet supported Sitting balance-Leahy Scale: Good     Standing balance support: No upper extremity supported;During functional activity Standing balance-Leahy Scale: Fair                              Cognition Arousal/Alertness: Awake/alert Behavior During Therapy: WFL for tasks assessed/performed Overall Cognitive Status: Within Functional Limits for tasks assessed                                        Exercises      General Comments        Pertinent Vitals/Pain Pain Assessment: Faces Faces Pain Scale: Hurts little more Pain Location: back Pain Descriptors / Indicators: Aching Pain Intervention(s): Monitored during session    Home Living                      Prior Function            PT Goals (current goals can now be found in the care plan section) Acute Rehab PT Goals PT Goal Formulation: With patient Time For Goal Achievement: 10/11/19 Potential to Achieve Goals: Good Progress towards PT goals: Progressing toward goals    Frequency    Min 5X/week      PT Plan Current plan  remains appropriate    Co-evaluation              AM-PAC PT "6 Clicks" Mobility   Outcome Measure  Help needed turning from your back to your side while in a flat bed without using bedrails?: None Help needed moving from lying on your back to sitting on the side of a flat bed without using bedrails?: None Help needed moving to and from a bed to a chair (including a wheelchair)?: None Help needed standing up from a chair using your arms (e.g., wheelchair or bedside chair)?: None Help needed to walk in hospital room?: None Help needed climbing 3-5 steps with a railing? : A  Little 6 Click Score: 23    End of Session Equipment Utilized During Treatment: Back brace Activity Tolerance: Patient tolerated treatment well Patient left: in chair;with call bell/phone within reach Nurse Communication: Mobility status PT Visit Diagnosis: Unsteadiness on feet (R26.81);Pain;History of falling (Z91.81) Pain - part of body:  (back)     Time: 7858-8502 PT Time Calculation (min) (ACUTE ONLY): 26 min  Charges:  $Gait Training: 23-37 mins                     Anastasio Champion, DPT  Acute Rehabilitation Services Pager [phone] Office [place] 09/28/2019, 9:26 AM

## 2019-09-28 NOTE — Evaluation (Signed)
Occupational Therapy Evaluation Patient Details Name: Cameron Roman MRN: [id number] DOB: Nov 03, 1950 Today's Date: 09/28/2019    History of Present Illness Pt is a 69 y.o. M with significant PMH of CVA, HTN, atrial fibrillation and prior back surgeries who presents with lumbar foraminal stenosis s/p L5-S1 TLIF.    Clinical Impression   Pt PTA: Pt living alone, reports independence and likes to stay active. Pt currently able to state precautions. Pt able to perform UB dressing/brace donning with no difficulty; pt using hip hike and figure 4 technique for LB ADL. Pt able to maintain back precautions with no cues. Pt using RW for mobility in room and simulating walk in shower transfer with minguardA.  Back handout provided and reviewed ADL in detail. Pt educated on: clothing between brace, never sleep in brace, set an alarm at night for medication, avoid sitting for long periods of time, correct bed positioning for sleeping, correct sequence for bed mobility, avoiding lifting more than 5 pounds and never wash directly over incision. All education is complete and patient indicates understanding. Pt does not require continued OT skilled services. OT signing off.     Follow Up Recommendations  No OT follow up;Supervision - Intermittent    Equipment Recommendations  None recommended by OT    Recommendations for Other Services       Precautions / Restrictions Precautions Precautions: Fall;Back Precaution Booklet Issued: Yes (comment) Precaution Comments: Pt recalling 3/3 Required Braces or Orthoses: Spinal Brace Spinal Brace: Lumbar corset;Applied in sitting position Restrictions Weight Bearing Restrictions: No      Mobility Bed Mobility Overal bed mobility: Modified Independent             General bed mobility comments: Good log roll technique, use of bed rail and HOB slightly elevated  Transfers Overall transfer level: Needs assistance Equipment used: None Transfers: Sit  to/from Stand Sit to Stand: Supervision         General transfer comment: Increased time to rise from edge of bed    Balance Overall balance assessment: Needs assistance Sitting-balance support: Feet supported Sitting balance-Leahy Scale: Good     Standing balance support: No upper extremity supported;During functional activity Standing balance-Leahy Scale: Fair                             ADL either performed or assessed with clinical judgement   ADL Overall ADL's : At baseline                                       General ADL Comments: Pt able to perform UB dressing/brace donning with no difficulty; pt using hip hike and figure 4 technique for LB ADL. Pt able to maintain back precautions with no cues.     Vision Baseline Vision/History: Wears glasses Wears Glasses: At all times Patient Visual Report: No change from baseline Vision Assessment?: No apparent visual deficits     Perception     Praxis      Pertinent Vitals/Pain Pain Assessment: Faces Faces Pain Scale: Hurts little more Pain Location: back Pain Descriptors / Indicators: Grimacing;Guarding;Burning Pain Intervention(s): Monitored during session     Hand Dominance Right   Extremity/Trunk Assessment Upper Extremity Assessment Upper Extremity Assessment: Overall WFL for tasks assessed   Lower Extremity Assessment Lower Extremity Assessment: Overall WFL for tasks assessed   Cervical / Trunk Assessment Cervical /  Trunk Assessment: Other exceptions Cervical / Trunk Exceptions: s/p L5-S1 TLIF   Communication Communication Communication: No difficulties   Cognition Arousal/Alertness: Awake/alert Behavior During Therapy: WFL for tasks assessed/performed Overall Cognitive Status: Within Functional Limits for tasks assessed                                     General Comments       Exercises     Shoulder Instructions      Home Living Family/patient  expects to be discharged to:: Private residence Living Arrangements: Alone Available Help at Discharge: Family;Available 24 hours/day (daughter, 27 y.o. grandson) Type of Home: House Home Access: Stairs to enter Technical brewer of Steps: 2 Entrance Stairs-Rails: None Home Layout: One level     Bathroom Shower/Tub: Occupational psychologist: Handicapped height     Home Equipment: Environmental consultant - 2 wheels;Bedside commode;Hand held shower head;Shower seat          Prior Functioning/Environment Level of Independence: Independent        Comments: Very active, likes house/yard projects; plays with 2 dogs        OT Problem List: Decreased activity tolerance;Pain      OT Treatment/Interventions: Self-care/ADL training;Therapeutic exercise;Energy conservation;Therapeutic activities;Patient/family education;Balance training    OT Goals(Current goals can be found in the care plan section) Acute Rehab OT Goals Patient Stated Goal: not use a walker, be active OT Goal Formulation: With patient Time For Goal Achievement: 10/12/19 Potential to Achieve Goals: Good  OT Frequency: Min 2X/week   Barriers to D/C:            Co-evaluation              AM-PAC OT "6 Clicks" Daily Activity     Outcome Measure Help from another person eating meals?: None Help from another person taking care of personal grooming?: None Help from another person toileting, which includes using toliet, bedpan, or urinal?: None Help from another person bathing (including washing, rinsing, drying)?: A Little Help from another person to put on and taking off regular upper body clothing?: None Help from another person to put on and taking off regular lower body clothing?: None 6 Click Score: 23   End of Session Equipment Utilized During Treatment: Back brace;Rolling walker Nurse Communication: Mobility status;Precautions  Activity Tolerance: Patient tolerated treatment well;Patient limited by  pain Patient left: in chair;with call bell/phone within reach  OT Visit Diagnosis: Unsteadiness on feet (R26.81);Pain Pain - part of body:  (back)                Time: 8841-6606 OT Time Calculation (min): 24 min Charges:  OT General Charges $OT Visit: 1 Visit OT Evaluation $OT Eval Moderate Complexity: 1 Mod OT Treatments $Self Care/Home Management : 8-22 mins  Jefferey Pica, OTR/L Acute Rehabilitation Services Pager: [phone] Office: [phone]   Maxine Huynh C 09/28/2019, 11:21 AM

## 2019-09-28 NOTE — Discharge Summary (Signed)
Physician Discharge Summary  Patient ID: Cameron Roman MRN: [id number] DOB/AGE: 1950-04-14 69 y.o.  Admit date: 09/27/2019 Discharge date: 09/28/2019  Admission Diagnoses: Lumbar foraminal stenosis, herniated lumbar disc, lumbar radiculopathy, lumbago   Discharge Diagnoses: same   Discharged Condition: good  Hospital Course: The patient was admitted on 09/27/2019 and taken to the operating room where the patient underwent TLIF L5-S1. The patient tolerated the procedure well and was taken to the recovery room and then to the floor in stable condition. The hospital course was routine. There were no complications. The wound remained clean dry and intact. Pt had appropriate back soreness. No complaints of leg pain or new N/T/W. The patient remained afebrile with stable vital signs, and tolerated a regular diet. The patient continued to increase activities, and pain was well controlled with oral pain medications.   Consults: None  Significant Diagnostic Studies:  Results for orders placed or performed during the hospital encounter of 09/25/19  SARS CORONAVIRUS 2 (TAT 6-24 HRS) Nasopharyngeal Nasopharyngeal Swab   Specimen: Nasopharyngeal Swab  Result Value Ref Range   SARS Coronavirus 2 NEGATIVE NEGATIVE    DG Lumbar Spine 2-3 Views  Result Date: 09/27/2019 CLINICAL DATA:  Surgical fusion of L5-S1. EXAM: LUMBAR SPINE - 2-3 VIEW; DG C-ARM 1-60 MIN FLUOROSCOPY TIME:  22 seconds. COMPARISON:  None. FINDINGS: Four intraoperative fluoroscopic images were obtained of the lower lumbar spine. Patient is status post surgical posterior fusion of L5-S1 with bilateral intrapedicular screw placement and interbody fusion. IMPRESSION: Status post surgical posterior fusion of L5-S1. Electronically Signed   By: Marijo Conception M.D.   On: 09/27/2019 13:26   DG C-Arm 1-60 Min  Result Date: 09/27/2019 CLINICAL DATA:  Surgical fusion of L5-S1. EXAM: LUMBAR SPINE - 2-3 VIEW; DG C-ARM 1-60 MIN FLUOROSCOPY TIME:  22  seconds. COMPARISON:  None. FINDINGS: Four intraoperative fluoroscopic images were obtained of the lower lumbar spine. Patient is status post surgical posterior fusion of L5-S1 with bilateral intrapedicular screw placement and interbody fusion. IMPRESSION: Status post surgical posterior fusion of L5-S1. Electronically Signed   By: Marijo Conception M.D.   On: 09/27/2019 13:26    Antibiotics:  Anti-infectives (From admission, onward)   Start     Dose/Rate Route Frequency Ordered Stop   09/27/19 1600  ceFAZolin (ANCEF) IVPB 2g/100 mL premix        2 g 200 mL/hr over 30 Minutes Intravenous Every 8 hours 09/27/19 1232 09/27/19 2358   09/27/19 0600  ceFAZolin (ANCEF) IVPB 2g/100 mL premix        2 g 200 mL/hr over 30 Minutes Intravenous On call to O.R. 09/27/19 4034 09/27/19 0814      Discharge Exam: Blood pressure (!) 143/76, pulse 68, temperature 99 F (37.2 C), temperature source Oral, resp. rate 17, height 5\' 9"  (1.753 m), weight 68.5 kg, SpO2 99 %. Neurologic: Grossly normal Ambulating and voiding well, incision cdi  Discharge Medications:   Allergies as of 09/28/2019      Reactions   Percocet [oxycodone-acetaminophen] Itching      Medication List    TAKE these medications   Fish Oil 1000 MG Caps Take 1,000 mg by mouth in the morning and at bedtime.   fluticasone 50 MCG/ACT nasal spray Commonly known as: FLONASE Place 2 sprays into both nostrils daily.   gabapentin 300 MG capsule Commonly known as: NEURONTIN TAKE 1 CAPSULE 2 TO 3 TIMES A DAY What changed: See the new instructions.   HYDROcodone-acetaminophen 10-325 MG tablet Commonly  known as: NORCO Take 1 tablet by mouth 3 (three) times daily.   methocarbamol 500 MG tablet Commonly known as: ROBAXIN Take 1 tablet (500 mg total) by mouth every 6 (six) hours as needed for muscle spasms.   omeprazole 20 MG capsule Commonly known as: PRILOSEC Take 1 capsule (20 mg total) by mouth daily.   pravastatin 20 MG  tablet Commonly known as: PRAVACHOL Take 1 tablet (20 mg total) by mouth daily. What changed: when to take this   tamsulosin 0.4 MG Caps capsule Commonly known as: FLOMAX Take 0.4 mg by mouth daily.       Disposition: home   Final Dx: TLIF L5-S1  Discharge Instructions     Remove dressing in 72 hours   Complete by: As directed    Call MD for:  difficulty breathing, headache or visual disturbances   Complete by: As directed    Call MD for:  hives   Complete by: As directed    Call MD for:  persistant nausea and vomiting   Complete by: As directed    Call MD for:  redness, tenderness, or signs of infection (pain, swelling, redness, odor or green/yellow discharge around incision site)   Complete by: As directed    Call MD for:  severe uncontrolled pain   Complete by: As directed    Call MD for:  temperature >100.4   Complete by: As directed    Diet - low sodium heart healthy   Complete by: As directed    Driving Restrictions   Complete by: As directed    No driving for 2 weeks, no riding in the car for 1 week   Increase activity slowly   Complete by: As directed    Lifting restrictions   Complete by: As directed    No lifting more than 8 lbs         Signed: Ocie Cornfield Fairley Copher 09/28/2019, 7:46 AM

## 2019-09-30 MED FILL — Thrombin For Soln Kit 20000 Unit: CUTANEOUS | Qty: 1 | Status: CN

## 2019-09-30 MED FILL — Thrombin For Soln 20000 Unit: CUTANEOUS | Qty: 1 | Status: AC

## 2019-09-30 MED FILL — Thrombin For Soln 5000 Unit: CUTANEOUS | Qty: 5000 | Status: AC

## 2019-10-01 ENCOUNTER — Encounter (HOSPITAL_COMMUNITY): Payer: Self-pay | Admitting: Neurosurgery

## 2019-10-04 ENCOUNTER — Other Ambulatory Visit: Payer: Self-pay | Admitting: Family Medicine

## 2019-10-23 DIAGNOSIS — M5416 Radiculopathy, lumbar region: Secondary | ICD-10-CM | POA: Diagnosis not present

## 2019-10-23 DIAGNOSIS — R030 Elevated blood-pressure reading, without diagnosis of hypertension: Secondary | ICD-10-CM | POA: Diagnosis not present

## 2019-11-05 ENCOUNTER — Telehealth: Payer: Self-pay | Admitting: Internal Medicine

## 2019-11-05 ENCOUNTER — Telehealth: Payer: Self-pay | Admitting: Family Medicine

## 2019-11-05 MED ORDER — PRAVASTATIN SODIUM 20 MG PO TABS: 20.0000 mg | ORAL_TABLET | Freq: Every day | ORAL | 0 refills | Status: DC

## 2019-11-05 MED ORDER — OMEPRAZOLE 20 MG PO CPDR: 20.0000 mg | DELAYED_RELEASE_CAPSULE | Freq: Every day | ORAL | 0 refills | Status: DC

## 2019-11-05 MED ORDER — GABAPENTIN 300 MG PO CAPS: ORAL_CAPSULE | ORAL | 0 refills | Status: DC

## 2019-11-05 MED ORDER — TAMSULOSIN HCL 0.4 MG PO CAPS: 0.4000 mg | ORAL_CAPSULE | Freq: Every day | ORAL | 0 refills | Status: DC

## 2019-11-05 NOTE — Telephone Encounter (Signed)
Pharmacy already corrected

## 2019-11-05 NOTE — Telephone Encounter (Signed)
LMOVM that main medications were sent to [facility]

## 2019-11-21 ENCOUNTER — Encounter: Payer: Self-pay | Admitting: Family Medicine

## 2019-11-21 ENCOUNTER — Ambulatory Visit (INDEPENDENT_AMBULATORY_CARE_PROVIDER_SITE_OTHER): Payer: Medicare Other | Admitting: Family Medicine

## 2019-11-21 ENCOUNTER — Other Ambulatory Visit: Payer: Self-pay

## 2019-11-21 VITALS — BP 141/76 | HR 82 | Temp 97.7°F | Ht 69.0 in | Wt 154.0 lb

## 2019-11-21 DIAGNOSIS — F4321 Adjustment disorder with depressed mood: Secondary | ICD-10-CM

## 2019-11-21 DIAGNOSIS — R7303 Prediabetes: Secondary | ICD-10-CM | POA: Diagnosis not present

## 2019-11-21 DIAGNOSIS — I10 Essential (primary) hypertension: Secondary | ICD-10-CM

## 2019-11-21 DIAGNOSIS — E782 Mixed hyperlipidemia: Secondary | ICD-10-CM | POA: Diagnosis not present

## 2019-11-21 DIAGNOSIS — K219 Gastro-esophageal reflux disease without esophagitis: Secondary | ICD-10-CM

## 2019-11-21 DIAGNOSIS — F4381 Prolonged grief disorder: Secondary | ICD-10-CM

## 2019-11-21 LAB — CBC WITH DIFFERENTIAL/PLATELET
Basophils Absolute: 0.1 10*3/uL (ref 0.0–0.2)
Basos: 1 %
EOS (ABSOLUTE): 0.3 10*3/uL (ref 0.0–0.4)
Eos: 3 %
Hematocrit: 38.4 % (ref 37.5–51.0)
Hemoglobin: 12.7 g/dL — ABNORMAL LOW (ref 13.0–17.7)
Immature Grans (Abs): 0 10*3/uL (ref 0.0–0.1)
Immature Granulocytes: 0 %
Lymphocytes Absolute: 4.2 10*3/uL — ABNORMAL HIGH (ref 0.7–3.1)
Lymphs: 45 %
MCH: 31.1 pg (ref 26.6–33.0)
MCHC: 33.1 g/dL (ref 31.5–35.7)
MCV: 94 fL (ref 79–97)
Monocytes Absolute: 0.9 10*3/uL (ref 0.1–0.9)
Monocytes: 9 %
Neutrophils Absolute: 3.9 10*3/uL (ref 1.4–7.0)
Neutrophils: 42 %
Platelets: 309 10*3/uL (ref 150–450)
RBC: 4.09 x10E6/uL — ABNORMAL LOW (ref 4.14–5.80)
RDW: 13.9 % (ref 11.6–15.4)
WBC: 9.4 10*3/uL (ref 3.4–10.8)

## 2019-11-21 LAB — CMP14+EGFR
ALT: 6 IU/L (ref 0–44)
AST: 11 IU/L (ref 0–40)
Albumin/Globulin Ratio: 1.4 (ref 1.2–2.2)
Albumin: 4.3 g/dL (ref 3.8–4.8)
Alkaline Phosphatase: 91 IU/L (ref 48–121)
BUN/Creatinine Ratio: 16 (ref 10–24)
BUN: 13 mg/dL (ref 8–27)
Bilirubin Total: 0.4 mg/dL (ref 0.0–1.2)
CO2: 25 mmol/L (ref 20–29)
Calcium: 9.3 mg/dL (ref 8.6–10.2)
Chloride: 103 mmol/L (ref 96–106)
Creatinine, Ser: 0.81 mg/dL (ref 0.76–1.27)
GFR calc Af Amer: 106 mL/min/{1.73_m2} (ref >59)
GFR calc non Af Amer: 92 mL/min/{1.73_m2} (ref >59)
Globulin, Total: 3 g/dL (ref 1.5–4.5)
Glucose: 99 mg/dL (ref 65–99)
Potassium: 4 mmol/L (ref 3.5–5.2)
Sodium: 139 mmol/L (ref 134–144)
Total Protein: 7.3 g/dL (ref 6.0–8.5)

## 2019-11-21 LAB — LIPID PANEL
Chol/HDL Ratio: 3.3 ratio (ref 0.0–5.0)
Cholesterol, Total: 129 mg/dL (ref 100–199)
HDL: 39 mg/dL — ABNORMAL LOW (ref >39)
LDL Chol Calc (NIH): 72 mg/dL (ref 0–99)
Triglycerides: 96 mg/dL (ref 0–149)
VLDL Cholesterol Cal: 18 mg/dL (ref 5–40)

## 2019-11-21 LAB — BAYER DCA HB A1C WAIVED: HB A1C (BAYER DCA - WAIVED): 5.7 % (ref <7.0)

## 2019-11-21 MED ORDER — FLUOXETINE HCL 20 MG PO TABS: 20.0000 mg | ORAL_TABLET | Freq: Every day | ORAL | 5 refills | Status: DC

## 2019-11-21 NOTE — Progress Notes (Signed)
BP (!) 141/76   Pulse 82   Temp 97.7 F (36.5 C)   Ht _0  (1.753 m)   Wt 154 lb (69.9 kg)   SpO2 98%   BMI 22.74 kg/m    Subjective:   Patient ID: Cameron Roman, male    DOB: 02/04/1951, 69 y.o.   MRN: [id number]  HPI: Cameron Roman is a 70 y.o. male presenting on 11/21/2019 for Medical Management of Chronic Issues, Back Pain (chronic), and Prediabetes (controls with diet)   HPI Prediabetes Patient comes in today for recheck of his diabetes. Patient has been currently taking no medication has been diet controlled.. Patient is not currently on an ACE inhibitor/ARB. Patient has not seen an ophthalmologist this year. Patient denies any issues with their feet. The symptom started onset as an adult hypertension and hyperlipidemia ARE RELATED TO DM   Hypertension Patient is currently on no medication currently has been diet control, and their blood pressure today is 141/76. Patient denies any lightheadedness or dizziness. Patient denies headaches, blurred vision, chest pains, shortness of breath, or weakness. Denies any side effects from medication and is content with current medication.   Hyperlipidemia Patient is coming in for recheck of his hyperlipidemia. The patient is currently taking pravastatin and fish oil. They deny any issues with myalgias or history of liver damage from it. They deny any focal numbness or weakness or chest pain.   GERD Patient is currently on omeprazole.  She denies any major symptoms or abdominal pain or belching or burping. She denies any blood in her stool or lightheadedness or dizziness.   Patient is coming in with anxiety and irritability saying that he is been more irritable and easily upset since his wife passed about 8 months ago. He says he has been struggling with this and his daughter recommended for him to come in and get checked out and discussed treatment. His daughter is on Prozac and that is what she is suggested for him. He says he just easily  gets upset and cries easily. Denies any suicidal ideations or thoughts of hurting himself.  Relevant past medical, surgical, family and social history reviewed and updated as indicated. Interim medical history since our last visit reviewed. Allergies and medications reviewed and updated.  Review of Systems  Constitutional: Negative for chills and fever.  Respiratory: Negative for shortness of breath and wheezing.   Cardiovascular: Negative for chest pain and leg swelling.  Musculoskeletal: Negative for back pain and gait problem.  Skin: Negative for rash.  Neurological: Negative for dizziness, weakness and light-headedness.  Psychiatric/Behavioral: Positive for dysphoric mood and sleep disturbance. The patient is nervous/anxious.   All other systems reviewed and are negative.   Per HPI unless specifically indicated above   Allergies as of 11/21/2019      Reactions   Percocet [oxycodone-acetaminophen] Itching      Medication List       Accurate as of November 21, 2019  8:18 AM. If you have any questions, ask your nurse or doctor.        Fish Oil 1000 MG Caps Take 1,000 mg by mouth in the morning and at bedtime.   fluticasone 50 MCG/ACT nasal spray Commonly known as: FLONASE Place 2 sprays into both nostrils daily.   gabapentin 300 MG capsule Commonly known as: NEURONTIN TAKE 1 CAPSULE 2 TO 3 TIMES A DAY   HYDROcodone-acetaminophen 10-325 MG tablet Commonly known as: NORCO Take 1 tablet by mouth 3 (three) times daily.  methocarbamol 500 MG tablet Commonly known as: ROBAXIN Take 1 tablet (500 mg total) by mouth every 6 (six) hours as needed for muscle spasms.   omeprazole 20 MG capsule Commonly known as: PRILOSEC Take 1 capsule (20 mg total) by mouth daily.   pravastatin 20 MG tablet Commonly known as: PRAVACHOL Take 1 tablet (20 mg total) by mouth daily.   tamsulosin 0.4 MG Caps capsule Commonly known as: FLOMAX Take 1 capsule (0.4 mg total) by mouth daily.          Objective:   BP (!) 141/76   Pulse 82   Temp 97.7 F (36.5 C)   Ht _0  (1.753 m)   Wt 154 lb (69.9 kg)   SpO2 98%   BMI 22.74 kg/m   Wt Readings from Last 3 Encounters:  11/21/19 154 lb (69.9 kg)  09/27/19 151 lb (68.5 kg)  09/25/19 151 lb 1.6 oz (68.5 kg)    Physical Exam Vitals and nursing note reviewed.  Constitutional:      General: He is not in acute distress.    Appearance: He is well-developed. He is not diaphoretic.  Eyes:     General: No scleral icterus.    Conjunctiva/sclera: Conjunctivae normal.  Neck:     Thyroid: No thyromegaly.  Cardiovascular:     Rate and Rhythm: Normal rate and regular rhythm.     Heart sounds: Normal heart sounds. No murmur heard.   Pulmonary:     Effort: Pulmonary effort is normal. No respiratory distress.     Breath sounds: Normal breath sounds. No wheezing.  Musculoskeletal:        General: Normal range of motion.     Cervical back: Neck supple.  Lymphadenopathy:     Cervical: No cervical adenopathy.  Skin:    General: Skin is warm and dry.     Findings: No rash.  Neurological:     Mental Status: He is alert and oriented to person, place, and time.     Coordination: Coordination normal.  Psychiatric:        Behavior: Behavior normal.    Assessment & Plan:   Problem List Items Addressed This Visit      Cardiovascular and Mediastinum   HTN (hypertension) - Primary   Relevant Orders   CMP14+EGFR     Digestive   GERD   Relevant Orders   CBC with Differential/Platelet     Other   Prediabetes   Relevant Orders   Bayer DCA Hb A1c Waived   CMP14+EGFR   HLD (hyperlipidemia)   Relevant Orders   Lipid panel    Other Visit Diagnoses    Grief reaction with prolonged bereavement       Relevant Medications   FLUoxetine (PROZAC) 20 MG tablet      Patient just had a back surgery and seems to be doing better with it, still having pain, follows up with surgeon later this month.  Patient has bilateral  shoulder pain, likely arthritis and rotator cuff injury based on exam, he is not hurting bad enough to want injection today but will discuss it in the future.  Will start Prozac because patient has been feeling more anxious and irritability since his wife passed away 8 months ago. Follow up plan: Return in about 3 months (around 02/20/2020), or if symptoms worsen or fail to improve, for Follow-up anxiety and irritability.  Counseling provided for all of the vaccine components No orders of the defined types were placed in this encounter.  Caryl Pina, MD [facility] 11/21/2019, 8:18 AM

## 2019-11-25 ENCOUNTER — Other Ambulatory Visit: Payer: Self-pay | Admitting: Family Medicine

## 2019-12-04 DIAGNOSIS — M47816 Spondylosis without myelopathy or radiculopathy, lumbar region: Secondary | ICD-10-CM | POA: Diagnosis not present

## 2019-12-12 ENCOUNTER — Telehealth: Payer: Self-pay

## 2019-12-12 NOTE — Telephone Encounter (Signed)
Error

## 2019-12-13 ENCOUNTER — Telehealth: Payer: Self-pay

## 2019-12-13 MED ORDER — APIXABAN 5 MG PO TABS: 5.0000 mg | ORAL_TABLET | Freq: Two times a day (BID) | ORAL | 11 refills | Status: DC

## 2019-12-13 NOTE — Telephone Encounter (Signed)
Can you please refill patient eliquis he called 3 times

## 2019-12-13 NOTE — Telephone Encounter (Signed)
Returned call to Pt.  Per review of records, PCP removed Eliquis from Pt's med list November 2020 with no documentation as to why and OV note does not mention atrial fibrillation.  Pt states he has not been advised to stop Eliquis.  He ran out for a period of time when his wife was sick and he could not afford it.  Refill sent to pharmacy.

## 2019-12-21 ENCOUNTER — Other Ambulatory Visit: Payer: Self-pay | Admitting: Family Medicine

## 2020-01-22 DIAGNOSIS — K7469 Other cirrhosis of liver: Secondary | ICD-10-CM | POA: Diagnosis not present

## 2020-01-24 ENCOUNTER — Other Ambulatory Visit: Payer: Self-pay | Admitting: Nurse Practitioner

## 2020-01-24 DIAGNOSIS — K7469 Other cirrhosis of liver: Secondary | ICD-10-CM

## 2020-01-30 ENCOUNTER — Other Ambulatory Visit: Payer: Medicare Other

## 2020-02-14 ENCOUNTER — Ambulatory Visit: Payer: Medicare Other | Admitting: Internal Medicine

## 2020-02-14 ENCOUNTER — Other Ambulatory Visit: Payer: Self-pay

## 2020-02-14 ENCOUNTER — Encounter: Payer: Self-pay | Admitting: Internal Medicine

## 2020-02-14 VITALS — BP 108/68 | HR 68 | Ht 70.0 in | Wt 154.0 lb

## 2020-02-14 DIAGNOSIS — I480 Paroxysmal atrial fibrillation: Secondary | ICD-10-CM

## 2020-02-14 NOTE — Patient Instructions (Signed)
Medication Instructions:  Your physician recommends that you continue on your current medications as directed. Please refer to the Current Medication list given to you today.  Eat more Salt   *If you need a refill on your cardiac medications before your next appointment, please call your pharmacy*   Lab Work: NONE   If you have labs (blood work) drawn today and your tests are completely normal, you will receive your results only by:  [place] (if you have MyChart) OR  A paper copy in the mail If you have any lab test that is abnormal or we need to change your treatment, we will call you to review the results.   Testing/Procedures: NONE    Follow-Up: At [facility], you and your health needs are our priority.  As part of our continuing mission to provide you with exceptional heart care, we have created designated Provider Care Teams.  These Care Teams include your primary Cardiologist (physician) and Advanced Practice Providers (APPs -  Physician Assistants and Nurse Practitioners) who all work together to provide you with the care you need, when you need it.  We recommend signing up for the patient portal called "MyChart".  Sign up information is provided on this After Visit Summary.  MyChart is used to connect with patients for Virtual Visits (Telemedicine).  Patients are able to view lab/test results, encounter notes, upcoming appointments, etc.  Non-urgent messages can be sent to your provider as well.   To learn more about what you can do with MyChart, go to [email or web address].    Your next appointment:   1 year(s)  The format for your next appointment:   In Person  Provider:   [name], MD   Other Instructions Thank you for choosing [facility]!

## 2020-02-14 NOTE — Progress Notes (Signed)
HPI [name] returns today for followup of atrial fib. He is a pleasant 69yo man with a cryptogenic stroke, s/p ILR which demonstrated atrial fib. He has been prescribed elquis but was only taking one tab a day. He thought that it was causing orthostatic symptoms. His bp tends to run low. He has not had any bleeding. No chest pain or sob. He did have back surgery with a revision of his spinal screws. He states that his back squeaks.  Allergies  Allergen Reactions  . Percocet [Oxycodone-Acetaminophen] Itching     Current Outpatient Medications  Medication Sig Dispense Refill  . apixaban (ELIQUIS) 5 MG TABS tablet Take 1 tablet (5 mg total) by mouth 2 (two) times daily. 60 tablet 11  . fluticasone (FLONASE) 50 MCG/ACT nasal spray Place 2 sprays into both nostrils daily. 16 g 6  . gabapentin (NEURONTIN) 300 MG capsule TAKE ONE CAPSULE TWICE TO THREE TIMES DAILY 90 capsule 4  . HYDROcodone-acetaminophen (NORCO) 10-325 MG tablet Take 1 tablet by mouth 3 (three) times daily. 30 tablet 0  . methocarbamol (ROBAXIN) 500 MG tablet Take 1 tablet (500 mg total) by mouth every 6 (six) hours as needed for muscle spasms. 40 tablet 0  . Omega-3 Fatty Acids (FISH OIL) 1000 MG CAPS Take 1,000 mg by mouth in the morning and at bedtime.     Marland Kitchen omeprazole (PRILOSEC) 20 MG capsule TAKE ONE (1) CAPSULE EACH DAY 30 capsule 5  . pravastatin (PRAVACHOL) 20 MG tablet TAKE ONE (1) TABLET EACH DAY 30 tablet 4  . tamsulosin (FLOMAX) 0.4 MG CAPS capsule TAKE ONE (1) CAPSULE EACH DAY 30 capsule 4   No current facility-administered medications for this visit.     Past Medical History:  Diagnosis Date  . Arthritis   . Chronic back pain    buldging disc  . Chronic neck pain    ruptured disc  . Cough    hx smoking  . Diverticulosis   . Esophageal stricture   . GERD (gastroesophageal reflux disease)    takes Prilosec daily  . H/O hiatal hernia   . Hearing loss   . Hepatitis-C 2017  . Hiatal hernia   .  History of colon polyps   . Insomnia    takes Elavil nightly  . 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 Stroke ([place])     ROS:   All systems reviewed and negative except as noted in the HPI.   Past Surgical History:  Procedure Laterality Date  . ANTERIOR CERVICAL DECOMP/DISCECTOMY FUSION  04/12/2011   Procedure: ANTERIOR CERVICAL DECOMPRESSION/DISCECTOMY FUSION 2 LEVELS;  Surgeon: Peggyann Shoals, MD;  Location: [facility];  Service: Neurosurgery;  Laterality: N/A;  exploration of Cervical four - seven  fusion with redo Cervical six- seven, cervical three- four anterior cervical decompression with fusion interbody prothesis plating and bonegraft and C34 anterior cervical decompression with inte  . APPENDECTOMY     at age 11  . CARDIAC CATHETERIZATION  06/03/04  . COLONOSCOPY    . EP IMPLANTABLE DEVICE N/A 09/14/2015   Procedure: Loop Recorder Insertion;  Surgeon: Evans Lance, MD;  Location: [facility];  Service: Cardiovascular;  Laterality: N/A;  . HAND SURGERY Right 2005   right  . INNER EAR SURGERY Bilateral    bil;titanium in both ears  . lower back surgery  2009   had plates and screws inserted  . NECK SURGERY  2009   had insertion of  plates and screws  . TEE WITHOUT CARDIOVERSION N/A 09/14/2015   Procedure: TRANSESOPHAGEAL ECHOCARDIOGRAM (TEE);  Surgeon: Thayer Headings, MD;  Location: [place] [facility];  Service: Cardiovascular;  Laterality: N/A;  . TRANSFORAMINAL LUMBAR INTERBODY FUSION (TLIF) WITH PEDICLE SCREW FIXATION 1 LEVEL Left 09/27/2019   Procedure: Left Lumbar 5 Sacral 1 Transforaminal lumbar interbody fusion with exploration/removal of adjacent level hardware;  Surgeon: Erline Levine, MD;  Location: [facility];  Service: Neurosurgery;  Laterality: Left;  3C/RM 32     Family History  Problem Relation Age of Onset  . Colon cancer Father   . Other Mother        Perforated bowel  . Colon cancer Maternal Grandmother   . Colon cancer Maternal Grandfather   . Heart  disease Maternal Uncle   . Stroke Maternal Uncle   . Cancer Brother   . Healthy Son   . Healthy Daughter   . Anesthesia problems Neg Hx   . Hypotension Neg Hx   . Malignant hyperthermia Neg Hx   . Pseudochol deficiency Neg Hx      Social History   Socioeconomic History  . Marital status: Widowed    Spouse name: Cameron Roman  . Number of children: 2  . Years of education: 8th Grade  . Highest education level: 8th grade  Occupational History  . Occupation: disabled    Employer: DISABLED  Tobacco Use  . Smoking status: Former Smoker    Packs/day: 1.00    Years: 41.00    Pack years: 41.00    Types: Cigarettes    Quit date: 06/29/2002    Years since quitting: 17.6  . Smokeless tobacco: Never Used  . Tobacco comment: quit 71yrs ago  Vaping Use  . Vaping Use: Never used  Substance and Sexual Activity  . Alcohol use: Not Currently    Alcohol/week: 1.0 standard drink    Types: 1 Cans of beer per week    Comment: occasional less than one a week  . Drug use: No  . Sexual activity: Yes    Birth control/protection: None  Other Topics Concern  . Not on file  Social History Narrative   Lives alone, his wife passed away 02-Feb-2019.  Has 2 children.     Currently does not work.  On disability for wrist pain since early 2000s.   Formerly a Administrator.   Social Determinants of Health   Financial Resource Strain: High Risk  . Difficulty of Paying Living Expenses: Hard  Food Insecurity: No Food Insecurity  . Worried About Charity fundraiser in the Last Year: Never true  . Ran Out of Food in the Last Year: Never true  Transportation Needs: No Transportation Needs  . Lack of Transportation (Medical): No  . Lack of Transportation (Non-Medical): No  Physical Activity: Sufficiently Active  . Days of Exercise per Week: 7 days  . Minutes of Exercise per Session: 40 min  Stress: No Stress Concern Present  . Feeling of Stress : Not at all  Social Connections: Socially Isolated  .  Frequency of Communication with Friends and Family: More than three times a week  . Frequency of Social Gatherings with Friends and Family: More than three times a week  . Attends Religious Services: Never  . Active Member of Clubs or Organizations: No  . Attends Archivist Meetings: Never  . Marital Status: Widowed  Intimate Partner Violence: Not At Risk  . Fear of Current or Ex-Partner: No  . Emotionally Abused:  No  . Physically Abused: No  . Sexually Abused: No     BP 108/68   Pulse 68   Ht 5\' 10"  (1.778 m)   Wt 154 lb (69.9 kg)   SpO2 98%   BMI 22.10 kg/m   Physical Exam:  Well appearing 69 yo man, NAD HEENT: Unremarkable Neck:  No JVD, no thyromegally Lymphatics:  No adenopathy Back:  No CVA tenderness Lungs:  Clear with no wheezes HEART:  Regular rate rhythm, no murmurs, no rubs, no clicks Abd:  soft, positive bowel sounds, no organomegally, no rebound, no guarding Ext:  2 plus pulses, no edema, no cyanosis, no clubbing Skin:  No rashes no nodules Neuro:  CN II through XII intact, motor grossly intact  EKG - nsr  Assess/Plan: 1. PAF - he is mostly maintaining nsr. 2. Coags - he decreased his dose of eliquis. I asked him to take it twice daily. 3. orthostasis - I encouraged him to eat more salty foods. 4. Stroke - he has not had any recurrent symptoms despite not taking the [facility] correctly. He will restart bid eliquis.  Carleene Overlie Sabriyah Wilcher,MD

## 2020-02-24 DIAGNOSIS — M545 Low back pain, unspecified: Secondary | ICD-10-CM | POA: Diagnosis not present

## 2020-02-24 DIAGNOSIS — M5416 Radiculopathy, lumbar region: Secondary | ICD-10-CM | POA: Diagnosis not present

## 2020-02-24 DIAGNOSIS — M48061 Spinal stenosis, lumbar region without neurogenic claudication: Secondary | ICD-10-CM | POA: Diagnosis not present

## 2020-03-10 ENCOUNTER — Ambulatory Visit
Admission: RE | Admit: 2020-03-10 | Discharge: 2020-03-10 | Disposition: A | Discharge status: Home / Self Care | Payer: Medicare Other | Source: Ambulatory Visit | Attending: Nurse Practitioner | Admitting: Nurse Practitioner

## 2020-03-10 DIAGNOSIS — K8020 Calculus of gallbladder without cholecystitis without obstruction: Secondary | ICD-10-CM | POA: Diagnosis not present

## 2020-03-10 DIAGNOSIS — K7460 Unspecified cirrhosis of liver: Secondary | ICD-10-CM | POA: Diagnosis not present

## 2020-03-10 DIAGNOSIS — K7469 Other cirrhosis of liver: Secondary | ICD-10-CM

## 2020-05-20 ENCOUNTER — Other Ambulatory Visit: Payer: Self-pay | Admitting: Family Medicine

## 2020-06-10 DIAGNOSIS — M5416 Radiculopathy, lumbar region: Secondary | ICD-10-CM | POA: Diagnosis not present

## 2020-06-10 DIAGNOSIS — M545 Low back pain, unspecified: Secondary | ICD-10-CM | POA: Diagnosis not present

## 2020-06-25 ENCOUNTER — Other Ambulatory Visit: Payer: Self-pay | Admitting: Family Medicine

## 2020-07-07 DIAGNOSIS — R3915 Urgency of urination: Secondary | ICD-10-CM | POA: Diagnosis not present

## 2020-07-22 DIAGNOSIS — K5790 Diverticulosis of intestine, part unspecified, without perforation or abscess without bleeding: Secondary | ICD-10-CM | POA: Insufficient documentation

## 2020-07-22 DIAGNOSIS — E119 Type 2 diabetes mellitus without complications: Secondary | ICD-10-CM

## 2020-07-22 DIAGNOSIS — K7469 Other cirrhosis of liver: Secondary | ICD-10-CM | POA: Diagnosis not present

## 2020-07-22 DIAGNOSIS — K7460 Unspecified cirrhosis of liver: Secondary | ICD-10-CM | POA: Insufficient documentation

## 2020-07-22 HISTORY — DX: Type 2 diabetes mellitus without complications: E11.9

## 2020-07-23 ENCOUNTER — Other Ambulatory Visit: Payer: Self-pay | Admitting: Nurse Practitioner

## 2020-07-23 DIAGNOSIS — K7469 Other cirrhosis of liver: Secondary | ICD-10-CM

## 2020-07-27 ENCOUNTER — Other Ambulatory Visit: Payer: Self-pay | Admitting: Family Medicine

## 2020-08-03 ENCOUNTER — Other Ambulatory Visit: Payer: Self-pay

## 2020-08-03 ENCOUNTER — Ambulatory Visit (INDEPENDENT_AMBULATORY_CARE_PROVIDER_SITE_OTHER): Payer: Medicare Other | Admitting: Family Medicine

## 2020-08-03 ENCOUNTER — Encounter: Payer: Self-pay | Admitting: Family Medicine

## 2020-08-03 VITALS — BP 144/68 | HR 69 | Ht 70.0 in | Wt 154.0 lb

## 2020-08-03 DIAGNOSIS — R7303 Prediabetes: Secondary | ICD-10-CM

## 2020-08-03 DIAGNOSIS — N400 Enlarged prostate without lower urinary tract symptoms: Secondary | ICD-10-CM | POA: Insufficient documentation

## 2020-08-03 DIAGNOSIS — E782 Mixed hyperlipidemia: Secondary | ICD-10-CM | POA: Diagnosis not present

## 2020-08-03 DIAGNOSIS — I10 Essential (primary) hypertension: Secondary | ICD-10-CM | POA: Diagnosis not present

## 2020-08-03 DIAGNOSIS — I480 Paroxysmal atrial fibrillation: Secondary | ICD-10-CM | POA: Diagnosis not present

## 2020-08-03 LAB — BAYER DCA HB A1C WAIVED: HB A1C (BAYER DCA - WAIVED): 5.6 % (ref <7.0)

## 2020-08-03 MED ORDER — PRAVASTATIN SODIUM 20 MG PO TABS: ORAL_TABLET | ORAL | 3 refills | Status: DC

## 2020-08-03 MED ORDER — OMEPRAZOLE 20 MG PO CPDR: 20.0000 mg | DELAYED_RELEASE_CAPSULE | Freq: Every day | ORAL | 3 refills | Status: DC

## 2020-08-03 MED ORDER — TAMSULOSIN HCL 0.4 MG PO CAPS: 0.4000 mg | ORAL_CAPSULE | Freq: Every day | ORAL | 3 refills | Status: DC

## 2020-08-03 NOTE — Progress Notes (Signed)
BP (!) 144/68   Pulse 69   Ht _0  (1.778 m)   Wt 154 lb (69.9 kg)   SpO2 100%   BMI 22.10 kg/m    Subjective:   Patient ID: Cameron Roman, male    DOB: 12-Jul-1950, 70 y.o.   MRN: [id number]  HPI: Cameron Roman is a 70 y.o. male presenting on 08/03/2020 for Medical Management of Chronic Issues and Hypertension   HPI Prediabetes Patient comes in today for recheck of his diabetes. Patient has been currently taking no medication. Patient is not currently on an ACE inhibitor/ARB. Patient has not seen an ophthalmologist this year. Patient denies any issues with their feet. The symptom started onset as an adult hypertension and hyperlipidemia ARE RELATED TO DM   Hypertension Patient is currently on no medication currently, has actually been running lower so they recommended start getting increased salt intake., and their blood pressure today is 144/68. Patient denies any lightheadedness or dizziness. Patient denies headaches, blurred vision, chest pains, shortness of breath, or weakness. Denies any side effects from medication and is content with current medication.   Hyperlipidemia Patient is coming in for recheck of his hyperlipidemia. The patient is currently taking pravastatin and fish oil. They deny any issues with myalgias or history of liver damage from it. They deny any focal numbness or weakness or chest pain.   BPH Patient is coming in for recheck on BPH Symptoms: None currently Medication: Flomax  Sees cardiology for afib   Relevant past medical, surgical, family and social history reviewed and updated as indicated. Interim medical history since our last visit reviewed. Allergies and medications reviewed and updated.  Review of Systems  Constitutional: Negative for chills and fever.  Eyes: Negative for visual disturbance.  Respiratory: Negative for shortness of breath and wheezing.   Cardiovascular: Negative for chest pain and leg swelling.  Musculoskeletal: Positive  for arthralgias. Negative for back pain and gait problem.  Skin: Negative for rash.  Neurological: Negative for dizziness, weakness and light-headedness.  All other systems reviewed and are negative.   Per HPI unless specifically indicated above   Allergies as of 08/03/2020      Reactions   Percocet [oxycodone-acetaminophen] Itching      Medication List       Accurate as of Aug 03, 2020  9:04 AM. If you have any questions, ask your nurse or doctor.        apixaban 5 MG Tabs tablet Commonly known as: Eliquis Take 1 tablet (5 mg total) by mouth 2 (two) times daily.   Fish Oil 1000 MG Caps Take 1,000 mg by mouth in the morning and at bedtime.   fluticasone 50 MCG/ACT nasal spray Commonly known as: FLONASE Place 2 sprays into both nostrils daily.   gabapentin 300 MG capsule Commonly known as: NEURONTIN TAKE ONE CAPSULE TWICE TO THREE TIMES DAILY   HYDROcodone-acetaminophen 10-325 MG tablet Commonly known as: NORCO Take 1 tablet by mouth 3 (three) times daily.   methocarbamol 500 MG tablet Commonly known as: ROBAXIN Take 1 tablet (500 mg total) by mouth every 6 (six) hours as needed for muscle spasms.   omeprazole 20 MG capsule Commonly known as: PRILOSEC Take 1 capsule (20 mg total) by mouth daily. (NEEDS TO BE SEEN BEFORE NEXT REFILL)   pravastatin 20 MG tablet Commonly known as: PRAVACHOL TAKE ONE (1) TABLET EACH DAY   tamsulosin 0.4 MG Caps capsule Commonly known as: FLOMAX Take 1 capsule (0.4 mg total) by mouth  daily. (NEEDS TO BE SEEN BEFORE NEXT REFILL)        Objective:   BP (!) 144/68   Pulse 69   Ht _0  (1.778 m)   Wt 154 lb (69.9 kg)   SpO2 100%   BMI 22.10 kg/m   Wt Readings from Last 3 Encounters:  08/03/20 154 lb (69.9 kg)  02/14/20 154 lb (69.9 kg)  11/21/19 154 lb (69.9 kg)    Physical Exam Vitals and nursing note reviewed.  Constitutional:      General: He is not in acute distress.    Appearance: He is well-developed. He is  not diaphoretic.  Eyes:     General: No scleral icterus.    Conjunctiva/sclera: Conjunctivae normal.  Neck:     Thyroid: No thyromegaly.  Cardiovascular:     Rate and Rhythm: Normal rate and regular rhythm.     Heart sounds: Normal heart sounds. No murmur heard.   Pulmonary:     Effort: Pulmonary effort is normal. No respiratory distress.     Breath sounds: Normal breath sounds. No wheezing.  Musculoskeletal:        General: No swelling. Normal range of motion.     Cervical back: Neck supple.  Lymphadenopathy:     Cervical: No cervical adenopathy.  Skin:    General: Skin is warm and dry.     Findings: No rash.  Neurological:     Mental Status: He is alert and oriented to person, place, and time.     Coordination: Coordination normal.  Psychiatric:        Behavior: Behavior normal.      Assessment & Plan:   Problem List Items Addressed This Visit      Cardiovascular and Mediastinum   HTN (hypertension) - Primary   Relevant Medications   pravastatin (PRAVACHOL) 20 MG tablet   Other Relevant Orders   CBC with Differential/Platelet   CMP14+EGFR   Paroxysmal atrial fibrillation (HCC)   Relevant Medications   pravastatin (PRAVACHOL) 20 MG tablet   Other Relevant Orders   CBC with Differential/Platelet     Genitourinary   BPH (benign prostatic hyperplasia)   Relevant Medications   tamsulosin (FLOMAX) 0.4 MG CAPS capsule     Other   Prediabetes   Relevant Orders   CBC with Differential/Platelet   Bayer DCA Hb A1c Waived   HLD (hyperlipidemia)   Relevant Medications   pravastatin (PRAVACHOL) 20 MG tablet   Other Relevant Orders   CBC with Differential/Platelet   Lipid panel      Continue current medication, no changes. Follow up plan: Return in about 6 months (around 02/03/2021), or if symptoms worsen or fail to improve, for A. fib and hypertension and BPH and prediabetes.  Counseling provided for all of the vaccine components No orders of the defined  types were placed in this encounter.   Cameron Pina, MD [facility] 08/03/2020, 9:04 AM

## 2020-08-04 LAB — CBC WITH DIFFERENTIAL/PLATELET
Basophils Absolute: 0.1 10*3/uL (ref 0.0–0.2)
Basos: 1 %
EOS (ABSOLUTE): 0.2 10*3/uL (ref 0.0–0.4)
Eos: 2 %
Hematocrit: 38 % (ref 37.5–51.0)
Hemoglobin: 12.6 g/dL — ABNORMAL LOW (ref 13.0–17.7)
Immature Grans (Abs): 0 10*3/uL (ref 0.0–0.1)
Immature Granulocytes: 0 %
Lymphocytes Absolute: 3.7 10*3/uL — ABNORMAL HIGH (ref 0.7–3.1)
Lymphs: 42 %
MCH: 31.7 pg (ref 26.6–33.0)
MCHC: 33.2 g/dL (ref 31.5–35.7)
MCV: 96 fL (ref 79–97)
Monocytes Absolute: 0.6 10*3/uL (ref 0.1–0.9)
Monocytes: 7 %
Neutrophils Absolute: 4.2 10*3/uL (ref 1.4–7.0)
Neutrophils: 48 %
Platelets: 355 10*3/uL (ref 150–450)
RBC: 3.97 x10E6/uL — ABNORMAL LOW (ref 4.14–5.80)
RDW: 14 % (ref 11.6–15.4)
WBC: 8.9 10*3/uL (ref 3.4–10.8)

## 2020-08-04 LAB — CMP14+EGFR
ALT: 5 IU/L (ref 0–44)
AST: 16 IU/L (ref 0–40)
Albumin/Globulin Ratio: 1.3 (ref 1.2–2.2)
Albumin: 4.3 g/dL (ref 3.8–4.8)
Alkaline Phosphatase: 100 IU/L (ref 44–121)
BUN/Creatinine Ratio: 16 (ref 10–24)
BUN: 14 mg/dL (ref 8–27)
Bilirubin Total: 0.3 mg/dL (ref 0.0–1.2)
CO2: 22 mmol/L (ref 20–29)
Calcium: 9.2 mg/dL (ref 8.6–10.2)
Chloride: 105 mmol/L (ref 96–106)
Creatinine, Ser: 0.88 mg/dL (ref 0.76–1.27)
Globulin, Total: 3.2 g/dL (ref 1.5–4.5)
Glucose: 94 mg/dL (ref 65–99)
Potassium: 4.4 mmol/L (ref 3.5–5.2)
Sodium: 143 mmol/L (ref 134–144)
Total Protein: 7.5 g/dL (ref 6.0–8.5)
eGFR: 94 mL/min/{1.73_m2} (ref >59)

## 2020-08-04 LAB — LIPID PANEL
Chol/HDL Ratio: 3.2 ratio (ref 0.0–5.0)
Cholesterol, Total: 123 mg/dL (ref 100–199)
HDL: 38 mg/dL — ABNORMAL LOW (ref >39)
LDL Chol Calc (NIH): 62 mg/dL (ref 0–99)
Triglycerides: 129 mg/dL (ref 0–149)
VLDL Cholesterol Cal: 23 mg/dL (ref 5–40)

## 2020-08-13 ENCOUNTER — Ambulatory Visit
Admission: RE | Admit: 2020-08-13 | Discharge: 2020-08-13 | Disposition: A | Discharge status: Home / Self Care | Payer: Medicare Other | Source: Ambulatory Visit | Attending: Nurse Practitioner | Admitting: Nurse Practitioner

## 2020-08-13 DIAGNOSIS — K8020 Calculus of gallbladder without cholecystitis without obstruction: Secondary | ICD-10-CM | POA: Diagnosis not present

## 2020-08-13 DIAGNOSIS — K7469 Other cirrhosis of liver: Secondary | ICD-10-CM

## 2020-08-27 ENCOUNTER — Other Ambulatory Visit: Payer: Self-pay | Admitting: Family Medicine

## 2020-09-09 DIAGNOSIS — M545 Low back pain, unspecified: Secondary | ICD-10-CM | POA: Diagnosis not present

## 2020-09-09 DIAGNOSIS — M5416 Radiculopathy, lumbar region: Secondary | ICD-10-CM | POA: Diagnosis not present

## 2020-11-19 ENCOUNTER — Ambulatory Visit (INDEPENDENT_AMBULATORY_CARE_PROVIDER_SITE_OTHER): Payer: Medicare Other

## 2020-11-19 VITALS — Ht 70.0 in | Wt 154.0 lb

## 2020-11-19 DIAGNOSIS — I480 Paroxysmal atrial fibrillation: Secondary | ICD-10-CM

## 2020-11-19 DIAGNOSIS — Z599 Problem related to housing and economic circumstances, unspecified: Secondary | ICD-10-CM

## 2020-11-19 DIAGNOSIS — Z9189 Other specified personal risk factors, not elsewhere classified: Secondary | ICD-10-CM | POA: Diagnosis not present

## 2020-11-19 DIAGNOSIS — Z0001 Encounter for general adult medical examination with abnormal findings: Secondary | ICD-10-CM | POA: Diagnosis not present

## 2020-11-19 DIAGNOSIS — Z8673 Personal history of transient ischemic attack (TIA), and cerebral infarction without residual deficits: Secondary | ICD-10-CM | POA: Diagnosis not present

## 2020-11-19 DIAGNOSIS — H9193 Unspecified hearing loss, bilateral: Secondary | ICD-10-CM

## 2020-11-19 DIAGNOSIS — Z91120 Patient's intentional underdosing of medication regimen due to financial hardship: Secondary | ICD-10-CM

## 2020-11-19 DIAGNOSIS — E119 Type 2 diabetes mellitus without complications: Secondary | ICD-10-CM | POA: Diagnosis not present

## 2020-11-19 DIAGNOSIS — Z0000 Encounter for general adult medical examination without abnormal findings: Secondary | ICD-10-CM

## 2020-11-19 NOTE — Progress Notes (Signed)
Subjective:   Cameron Roman is a 70 y.o. male who presents for Medicare Annual/Subsequent preventive examination.  Virtual Visit via Telephone Note  I connected with  Cameron Roman on 11/19/20 at  3:30 PM EDT by telephone and verified that I am speaking with the correct person using two identifiers.  Location: Patient: Home Provider: [facility] Persons participating in the virtual visit: patient/Nurse Health Advisor   I discussed the limitations, risks, security and privacy concerns of performing an evaluation and management service by telephone and the availability of in person appointments. The patient expressed understanding and agreed to proceed.  Interactive audio and video telecommunications were attempted between this nurse and patient, however failed, due to patient having technical difficulties OR patient did not have access to video capability.  We continued and completed visit with audio only.  Some vital signs may be absent or patient reported.   Cameron Barclift E Herbert Marken, LPN   Review of Systems     Cardiac Risk Factors include: advanced age (>8men, >22 women);dyslipidemia;male gender;sedentary lifestyle;Other (see comment), Risk factor comments: hx of TIA, A.Fib     Objective:    Today's Vitals   11/19/20 1535 11/19/20 1537  Weight: 154 lb (69.9 kg)   Height: 5\' 10"  (1.778 m)   PainSc:  5    Body mass index is 22.1 kg/m.  Advanced Directives 11/19/2020 09/25/2019 05/23/2019 09/19/2016 09/14/2015 09/11/2015 04/13/2011  Does Patient Have a Medical Advance Directive? No No No No No No Patient does not have advance directive;Patient would not like information  Would patient like information on creating a medical advance directive? No - Patient declined No - Patient declined No - Patient declined - No - patient declined information - -  Pre-existing out of facility DNR order (yellow form or pink MOST form) - - - - - - -    Current Medications (verified) Outpatient Encounter Medications as  of 11/19/2020  Medication Sig   apixaban (ELIQUIS) 5 MG TABS tablet Take 1 tablet (5 mg total) by mouth 2 (two) times daily.   fluticasone (FLONASE) 50 MCG/ACT nasal spray Place 2 sprays into both nostrils daily.   gabapentin (NEURONTIN) 300 MG capsule TAKE ONE CAPSULE TWICE TO THREE TIMES DAILY   HYDROcodone-acetaminophen (NORCO) 10-325 MG tablet Take 1 tablet by mouth 3 (three) times daily.   methocarbamol (ROBAXIN) 500 MG tablet Take 1 tablet (500 mg total) by mouth every 6 (six) hours as needed for muscle spasms.   Omega-3 Fatty Acids (FISH OIL) 1000 MG CAPS Take 1,000 mg by mouth in the morning and at bedtime.    omeprazole (PRILOSEC) 20 MG capsule Take 1 capsule (20 mg total) by mouth daily.   pravastatin (PRAVACHOL) 20 MG tablet TAKE ONE (1) TABLET EACH DAY   tamsulosin (FLOMAX) 0.4 MG CAPS capsule Take 1 capsule (0.4 mg total) by mouth daily.   [DISCONTINUED] fluticasone (FLONASE ALLERGY RELIEF) 50 MCG/ACT nasal spray Place into the nose.   No facility-administered encounter medications on file as of 11/19/2020.    Allergies (verified) Percocet [oxycodone-acetaminophen], Acetaminophen, and Oxycodone hcl   History: Past Medical History:  Diagnosis Date   Arthritis    Chronic back pain    buldging disc   Chronic neck pain    ruptured disc   Cough    hx smoking   Diverticulosis    Esophageal stricture    GERD (gastroesophageal reflux disease)    takes Prilosec daily   H/O hiatal hernia    Hearing loss  Hepatitis-C 2017   Hiatal hernia    History of colon polyps    Insomnia    takes Elavil nightly   Lipoma of abdominal wall 02/11/2011   Stroke ([place])    Type 2 diabetes mellitus ([place]) 07/22/2020   Past Surgical History:  Procedure Laterality Date   ANTERIOR CERVICAL DECOMP/DISCECTOMY FUSION  04/12/2011   Procedure: ANTERIOR CERVICAL DECOMPRESSION/DISCECTOMY FUSION 2 LEVELS;  Surgeon: Peggyann Shoals, MD;  Location: [facility];  Service: Neurosurgery;  Laterality: N/A;   exploration of Cervical four - seven  fusion with redo Cervical six- seven, cervical three- four anterior cervical decompression with fusion interbody prothesis plating and bonegraft and C34 anterior cervical decompression with inte   APPENDECTOMY     at age 17   CARDIAC CATHETERIZATION  06/03/04   COLONOSCOPY     EP IMPLANTABLE DEVICE N/A 09/14/2015   Procedure: Loop Recorder Insertion;  Surgeon: Evans Lance, MD;  Location: [facility];  Service: Cardiovascular;  Laterality: N/A;   HAND SURGERY Right 2005   right   INNER EAR SURGERY Bilateral    bil;titanium in both ears   lower back surgery  2009   had plates and screws inserted   NECK SURGERY  2009   had insertion of  plates and screws   TEE WITHOUT CARDIOVERSION N/A 09/14/2015   Procedure: TRANSESOPHAGEAL ECHOCARDIOGRAM (TEE);  Surgeon: Thayer Headings, MD;  Location: [facility];  Service: Cardiovascular;  Laterality: N/A;   TRANSFORAMINAL LUMBAR INTERBODY FUSION (TLIF) WITH PEDICLE SCREW FIXATION 1 LEVEL Left 09/27/2019   Procedure: Left Lumbar 5 Sacral 1 Transforaminal lumbar interbody fusion with exploration/removal of adjacent level hardware;  Surgeon: Erline Levine, MD;  Location: [facility];  Service: Neurosurgery;  Laterality: Left;  3C/RM 66   Family History  Problem Relation Age of Onset   Colon cancer Father    Other Mother        Perforated bowel   Colon cancer Maternal Grandmother    Colon cancer Maternal Grandfather    Heart disease Maternal Uncle    Stroke Maternal Uncle    Cancer Brother    Healthy Son    Healthy Daughter    Anesthesia problems Neg Hx    Hypotension Neg Hx    Malignant hyperthermia Neg Hx    Pseudochol deficiency Neg Hx    Social History   Socioeconomic History   Marital status: Widowed    Spouse name: Pamala Hurry   Number of children: 2   Years of education: 8th Grade   Highest education level: 8th grade  Occupational History   Occupation: disabled    Employer: DISABLED  Tobacco Use    Smoking status: Former    Packs/day: 1.00    Years: 41.00    Pack years: 41.00    Types: Cigarettes    Quit date: 06/29/2002    Years since quitting: 18.4   Smokeless tobacco: Never   Tobacco comments:    quit 28yrs ago  Vaping Use   Vaping Use: Never used  Substance and Sexual Activity   Alcohol use: Not Currently    Alcohol/week: 1.0 standard drink    Types: 1 Cans of beer per week    Comment: occasional less than one a week   Drug use: No   Sexual activity: Yes    Birth control/protection: None  Other Topics Concern   Not on file  Social History Narrative   Lives alone, his wife passed away Feb 07, 2019.  Has 2 children.  Currently does not work.  On disability for wrist pain since early 2000s.   Formerly a Administrator.   Social Determinants of Health   Financial Resource Strain: High Risk   Difficulty of Paying Living Expenses: Hard  Food Insecurity: No Food Insecurity   Worried About Charity fundraiser in the Last Year: Never true   Ran Out of Food in the Last Year: Never true  Transportation Needs: No Transportation Needs   Lack of Transportation (Medical): No   Lack of Transportation (Non-Medical): No  Physical Activity: Insufficiently Active   Days of Exercise per Week: 7 days   Minutes of Exercise per Session: 20 min  Stress: No Stress Concern Present   Feeling of Stress : Only a little  Social Connections: Socially Isolated   Frequency of Communication with Friends and Family: More than three times a week   Frequency of Social Gatherings with Friends and Family: More than three times a week   Attends Religious Services: Never   Marine scientist or Organizations: No   Attends Archivist Meetings: Never   Marital Status: Widowed    Tobacco Counseling Counseling given: Not Answered Tobacco comments: quit 49yrs ago   Clinical Intake:  Pre-visit preparation completed: Yes  Pain : 0-10 Pain Score: 5  Pain Type: Chronic pain Pain  Location: Back Pain Onset: More than a month ago Pain Frequency: Intermittent     BMI - recorded: 22.1 Nutritional Status: BMI of 19-24  Normal Nutritional Risks: None Diabetes: No  How often do you need to have someone help you when you read instructions, pamphlets, or other written materials from your doctor or pharmacy?: 1 - Never  Diabetic? No  Interpreter Needed?: No  Information entered by :: Jeanett Antonopoulos, LPN   Activities of Daily Living In your present state of health, do you have any difficulty performing the following activities: 11/19/2020  Hearing? N  Vision? N  Difficulty concentrating or making decisions? Y  Walking or climbing stairs? N  Dressing or bathing? N  Doing errands, shopping? N  Preparing Food and eating ? N  Using the Toilet? N  In the past six months, have you accidently leaked urine? N  Do you have problems with loss of bowel control? N  Managing your Medications? N  Managing your Finances? N  Housekeeping or managing your Housekeeping? N  Some recent data might be hidden    Patient Care Team: Dettinger, [name], MD as PCP - General (Family Medicine) Evans Lance, MD as PCP - Electrophysiology (Cardiology)  Indicate any recent Medical Services you may have received from other than [facility] providers in the past year (date may be approximate).     Assessment:   This is a routine wellness examination for [place].  Hearing/Vision screen Hearing Screening - Comments:: Moderate hearing difficulties - broke hearing aids - needs referral to new ENT or Audiologist - (advised patient to find out who is in network)  Vision Screening - Comments:: Wears glasses - up to date with annual eye exams with Dr Marin Comment  Dietary issues and exercise activities discussed: Current Exercise Habits: Home exercise routine, Type of exercise: walking;strength training/weights;stretching, Time (Minutes): 20, Frequency (Times/Week): 7, Weekly Exercise (Minutes/Week): 140,  Intensity: Moderate, Exercise limited by: orthopedic condition(s)   Goals Addressed             This Visit's Progress    DIET - INCREASE WATER INTAKE   On track    Try  to drink 6-8 glasses of water daily     Have 3 meals a day   On track    Eat 3 meals daily that consist of lean proteins, fruits and vegetables       Depression Screen PHQ 2/9 Scores 11/19/2020 08/03/2020 08/03/2020 11/21/2019 05/23/2019 05/20/2019 02/06/2019  PHQ - 2 Score 0 0 0 0 0 0 0  PHQ- 9 Score - - - - - - -    Fall Risk Fall Risk  11/19/2020 08/03/2020 08/03/2020 11/21/2019 05/23/2019  Falls in the past year? 0 0 0 0 0  Number falls in past yr: 0 - - - -  Injury with Fall? 0 - - - -  Risk for fall due to : Impaired vision;History of fall(s);Orthopedic patient - - - -  Follow up Education provided;Falls prevention discussed - - - -    FALL RISK PREVENTION PERTAINING TO THE HOME:  Any stairs in or around the home? Yes  If so, are there any without handrails? No  Home free of loose throw rugs in walkways, pet beds, electrical cords, etc? Yes  Adequate lighting in your home to reduce risk of falls? Yes   ASSISTIVE DEVICES UTILIZED TO PREVENT FALLS:  Life alert? No  Use of a cane, walker or w/c? No  Grab bars in the bathroom? Yes  Shower chair or bench in shower? No  Elevated toilet seat or a handicapped toilet? Yes   TIMED UP AND GO:  Was the test performed? No . Telephonic visit  Cognitive Function: MMSE - Mini Mental State Exam 05/22/2018  Not completed: (No Data)  Orientation to time 4  Orientation to Place 5  Registration 3  Attention/ Calculation 0  Recall 3  Language- name 2 objects 2  Language- repeat 1  Language- follow 3 step command 3  Language- read & follow direction 0  Write a sentence 0  Copy design 0  Total score 21     6CIT Screen 11/19/2020 05/23/2019  What Year? 0 points 0 points  What month? 0 points 0 points  What time? 0 points 0 points  Count back from 20 0 points 0 points   Months in reverse 0 points 0 points  Repeat phrase 4 points 4 points  Total Score 4 4    Immunizations Immunization History  Administered Date(s) Administered   Fluad Quad(high Dose 65+) 01/18/2019   Influenza, High Dose Seasonal PF 01/10/2018   Influenza,inj,Quad PF,6+ Mos 12/11/2012, 01/21/2014, 02/17/2015, 12/23/2015, 01/06/2017   Moderna Sars-Covid-2 Vaccination 07/12/2019, 08/15/2019   Zoster, Live 03/12/2013    TDAP status: Due, Education has been provided regarding the importance of this vaccine. Advised may receive this vaccine at local pharmacy or Health Dept. Aware to provide a copy of the vaccination record if obtained from local pharmacy or Health Dept. Verbalized acceptance and understanding.  Flu Vaccine status: Due, Education has been provided regarding the importance of this vaccine. Advised may receive this vaccine at local pharmacy or Health Dept. Aware to provide a copy of the vaccination record if obtained from local pharmacy or Health Dept. Verbalized acceptance and understanding.  Pneumococcal vaccine status: Due, Education has been provided regarding the importance of this vaccine. Advised may receive this vaccine at local pharmacy or Health Dept. Aware to provide a copy of the vaccination record if obtained from local pharmacy or Health Dept. Verbalized acceptance and understanding.  Covid-19 vaccine status: Information provided on how to obtain vaccines.   Qualifies for Shingles Vaccine? Yes  Zostavax completed Yes   Shingrix Completed?: No.    Education has been provided regarding the importance of this vaccine. Patient has been advised to call insurance company to determine out of pocket expense if they have not yet received this vaccine. Advised may also receive vaccine at local pharmacy or Health Dept. Verbalized acceptance and understanding.  Screening Tests Health Maintenance  Topic Date Due   Zoster Vaccines- Shingrix (1 of 2) Never done   FOOT EXAM   02/06/2020   URINE MICROALBUMIN  02/21/2020   OPHTHALMOLOGY EXAM  03/17/2020   INFLUENZA VACCINE  10/12/2020   COVID-19 Vaccine (3 - Booster for Moderna series) 08/02/2021 (Originally 01/15/2020)   TETANUS/TDAP  08/03/2021 (Originally 03/12/2016)   PNA vac Low Risk Adult (1 of 2 - PCV13) 08/03/2021 (Originally 12/18/2016)   HEMOGLOBIN A1C  02/03/2021   COLONOSCOPY (Pts 45-27yrs Insurance coverage will need to be confirmed)  08/25/2024   Hepatitis C Screening  Completed   HPV VACCINES  Aged Out    Health Maintenance  Health Maintenance Due  Topic Date Due   Zoster Vaccines- Shingrix (1 of 2) Never done   FOOT EXAM  02/06/2020   URINE MICROALBUMIN  02/21/2020   OPHTHALMOLOGY EXAM  03/17/2020   INFLUENZA VACCINE  10/12/2020    Colorectal cancer screening: Type of screening: Colonoscopy. Completed 08/25/2012. Repeat every 5 years  Lung Cancer Screening: (Low Dose CT Chest recommended if Age 60-80 years, 30 pack-year currently smoking OR have quit w/in 15years.) does not qualify.   Additional Screening:  Hepatitis C Screening: does qualify; Completed 12/30/2015  Vision Screening: Recommended annual ophthalmology exams for early detection of glaucoma and other disorders of the eye. Is the patient up to date with their annual eye exam?  Yes  Who is the provider or what is the name of the office in which the patient attends annual eye exams? [facility] If pt is not established with a provider, would they like to be referred to a provider to establish care? No .   Dental Screening: Recommended annual dental exams for proper oral hygiene  Community Resource Referral / Chronic Care Management: CRR required this visit?  Yes   CCM required this visit?  Yes      Plan:     I have personally reviewed and noted the following in the patient's chart:   Medical and social history Use of alcohol, tobacco or illicit drugs  Current medications and supplements including opioid prescriptions.  Patient is currently taking opioid prescriptions. Information provided to patient regarding non-opioid alternatives. Patient advised to discuss non-opioid treatment plan with their provider. Functional ability and status Nutritional status Physical activity Advanced directives List of other physicians Hospitalizations, surgeries, and ER visits in previous 12 months Vitals Screenings to include cognitive, depression, and falls Referrals and appointments  In addition, I have reviewed and discussed with patient certain preventive protocols, quality metrics, and best practice recommendations. A written personalized care plan for preventive services as well as general preventive health recommendations were provided to patient.     Sandrea Hammond, LPN   10/19/8674   Nurse Notes: CCM referral for assistance with medication costs, help with repairing teeth and helping with hearing aids if possible.

## 2020-11-19 NOTE — Patient Instructions (Signed)
Mr. Mallari , Thank you for taking time to come for your Medicare Wellness Visit. I appreciate your ongoing commitment to your health goals. Please review the following plan we discussed and let me know if I can assist you in the future.   Screening recommendations/referrals: Colonoscopy: Done 08/26/2019 - Repeat in 5 years  Recommended yearly ophthalmology/optometry visit for glaucoma screening and checkup Recommended yearly dental visit for hygiene and checkup  Vaccinations: Influenza vaccine: Due every fall Pneumococcal vaccine: Due. 2 doses one year apart Tdap vaccine: Done 03/12/2006 - Repeat in 10 years *due Shingles vaccine: Zostavax done 2014; Shingrix discussed. Please contact your pharmacy for coverage information.     Covid-19: Done 07/12/2019 & 08/15/2019 - due for booster  Advanced directives: Advance directive discussed with you today. Even though you declined this today, please call our office should you change your mind, and we can give you the proper paperwork for you to fill out.   Conditions/risks identified: Aim for 30 minutes of exercise or brisk walking each day, drink 6-8 glasses of water and eat lots of fruits and vegetables.   Next appointment: Follow up in one year for your annual wellness visit.   Preventive Care 27 Years and Older, Male  Preventive care refers to lifestyle choices and visits with your health care provider that can promote health and wellness. What does preventive care include? A yearly physical exam. This is also called an annual well check. Dental exams once or twice a year. Routine eye exams. Ask your health care provider how often you should have your eyes checked. Personal lifestyle choices, including: Daily care of your teeth and gums. Regular physical activity. Eating a healthy diet. Avoiding tobacco and drug use. Limiting alcohol use. Practicing safe sex. Taking low doses of aspirin every day. Taking vitamin and mineral supplements as  recommended by your health care provider. What happens during an annual well check? The services and screenings done by your health care provider during your annual well check will depend on your age, overall health, lifestyle risk factors, and family history of disease. Counseling  Your health care provider may ask you questions about your: Alcohol use. Tobacco use. Drug use. Emotional well-being. Home and relationship well-being. Sexual activity. Eating habits. History of falls. Memory and ability to understand (cognition). Work and work Statistician. Screening  You may have the following tests or measurements: Height, weight, and BMI. Blood pressure. Lipid and cholesterol levels. These may be checked every 5 years, or more frequently if you are over 56 years old. Skin check. Lung cancer screening. You may have this screening every year starting at age 21 if you have a 30-pack-year history of smoking and currently smoke or have quit within the past 15 years. Fecal occult blood test (FOBT) of the stool. You may have this test every year starting at age 75. Flexible sigmoidoscopy or colonoscopy. You may have a sigmoidoscopy every 5 years or a colonoscopy every 10 years starting at age 40. Prostate cancer screening. Recommendations will vary depending on your family history and other risks. Hepatitis C blood test. Hepatitis B blood test. Sexually transmitted disease (STD) testing. Diabetes screening. This is done by checking your blood sugar (glucose) after you have not eaten for a while (fasting). You may have this done every 1-3 years. Abdominal aortic aneurysm (AAA) screening. You may need this if you are a current or former smoker. Osteoporosis. You may be screened starting at age 65 if you are at high risk. Talk  with your health care provider about your test results, treatment options, and if necessary, the need for more tests. Vaccines  Your health care provider may recommend  certain vaccines, such as: Influenza vaccine. This is recommended every year. Tetanus, diphtheria, and acellular pertussis (Tdap, Td) vaccine. You may need a Td booster every 10 years. Zoster vaccine. You may need this after age 37. Pneumococcal 13-valent conjugate (PCV13) vaccine. One dose is recommended after age 27. Pneumococcal polysaccharide (PPSV23) vaccine. One dose is recommended after age 77. Talk to your health care provider about which screenings and vaccines you need and how often you need them. This information is not intended to replace advice given to you by your health care provider. Make sure you discuss any questions you have with your health care provider. Document Released: 03/27/2015 Document Revised: 11/18/2015 Document Reviewed: 12/30/2014 Elsevier Interactive Patient Education  2017 [place] Prevention in the Home Falls can cause injuries. They can happen to people of all ages. There are many things you can do to make your home safe and to help prevent falls. What can I do on the outside of my home? Regularly fix the edges of walkways and driveways and fix any cracks. Remove anything that might make you trip as you walk through a door, such as a raised step or threshold. Trim any bushes or trees on the path to your home. Use bright outdoor lighting. Clear any walking paths of anything that might make someone trip, such as rocks or tools. Regularly check to see if handrails are loose or broken. Make sure that both sides of any steps have handrails. Any raised decks and porches should have guardrails on the edges. Have any leaves, snow, or ice cleared regularly. Use sand or salt on walking paths during winter. Clean up any spills in your garage right away. This includes oil or grease spills. What can I do in the bathroom? Use night lights. Install grab bars by the toilet and in the tub and shower. Do not use towel bars as grab bars. Use non-skid mats or  decals in the tub or shower. If you need to sit down in the shower, use a plastic, non-slip stool. Keep the floor dry. Clean up any water that spills on the floor as soon as it happens. Remove soap buildup in the tub or shower regularly. Attach bath mats securely with double-sided non-slip rug tape. Do not have throw rugs and other things on the floor that can make you trip. What can I do in the bedroom? Use night lights. Make sure that you have a light by your bed that is easy to reach. Do not use any sheets or blankets that are too big for your bed. They should not hang down onto the floor. Have a firm chair that has side arms. You can use this for support while you get dressed. Do not have throw rugs and other things on the floor that can make you trip. What can I do in the kitchen? Clean up any spills right away. Avoid walking on wet floors. Keep items that you use a lot in easy-to-reach places. If you need to reach something above you, use a strong step stool that has a grab bar. Keep electrical cords out of the way. Do not use floor polish or wax that makes floors slippery. If you must use wax, use non-skid floor wax. Do not have throw rugs and other things on the floor that can make you  trip. What can I do with my stairs? Do not leave any items on the stairs. Make sure that there are handrails on both sides of the stairs and use them. Fix handrails that are broken or loose. Make sure that handrails are as long as the stairways. Check any carpeting to make sure that it is firmly attached to the stairs. Fix any carpet that is loose or worn. Avoid having throw rugs at the top or bottom of the stairs. If you do have throw rugs, attach them to the floor with carpet tape. Make sure that you have a light switch at the top of the stairs and the bottom of the stairs. If you do not have them, ask someone to add them for you. What else can I do to help prevent falls? Wear shoes that: Do not  have high heels. Have rubber bottoms. Are comfortable and fit you well. Are closed at the toe. Do not wear sandals. If you use a stepladder: Make sure that it is fully opened. Do not climb a closed stepladder. Make sure that both sides of the stepladder are locked into place. Ask someone to hold it for you, if possible. Clearly mark and make sure that you can see: Any grab bars or handrails. First and last steps. Where the edge of each step is. Use tools that help you move around (mobility aids) if they are needed. These include: Canes. Walkers. Scooters. Crutches. Turn on the lights when you go into a dark area. Replace any light bulbs as soon as they burn out. Set up your furniture so you have a clear path. Avoid moving your furniture around. If any of your floors are uneven, fix them. If there are any pets around you, be aware of where they are. Review your medicines with your doctor. Some medicines can make you feel dizzy. This can increase your chance of falling. Ask your doctor what other things that you can do to help prevent falls. This information is not intended to replace advice given to you by your health care provider. Make sure you discuss any questions you have with your health care provider. Document Released: 12/25/2008 Document Revised: 08/06/2015 Document Reviewed: 04/04/2014 Elsevier Interactive Patient Education  2017 [employer].

## 2020-11-23 ENCOUNTER — Other Ambulatory Visit: Payer: Self-pay | Admitting: Internal Medicine

## 2020-11-23 NOTE — Telephone Encounter (Signed)
Eliquis 5 mg refill request received. Patient is 70 years old, weight-69.9 kg, Crea- 0.88 on 08/03/20, Diagnosis-PAF, and last seen by Dr. Lovena Le on 02/14/20. Dose is appropriate based on dosing criteria. Will send in refill to requested pharmacy.

## 2020-11-24 ENCOUNTER — Telehealth: Payer: Self-pay

## 2020-11-24 NOTE — Chronic Care Management (AMB) (Signed)
  Chronic Care Management   Note  11/24/2020 Name: Cameron Roman MRN: [id number] DOB: 21-Jun-1950  Cameron Roman is a 70 y.o. year old male who is a primary care patient of Dettinger, [name], MD. I reached out to Cameron Roman by phone today in response to a referral sent by [name]'s patient's AWV (annual wellness visit) nurse, Georgiann Cocker, .      [name] was given information about Chronic Care Management services today including:  CCM service includes personalized support from designated clinical staff supervised by his physician, including individualized plan of care and coordination with other care providers 24/7 contact phone numbers for assistance for urgent and routine care needs. Service will only be billed when office clinical staff spend 20 minutes or more in a month to coordinate care. Only one practitioner may furnish and bill the service in a calendar month. The patient may stop CCM services at any time (effective at the end of the month) by phone call to the office staff. The patient will be responsible for cost sharing (co-pay) of up to 20% of the service fee (after annual deductible is met).  Patient agreed to services and verbal consent obtained.   Follow up plan: Telephone appointment with care management team member scheduled for:12/09/2020  Noreene Larsson, [address], [place], [place] [address] Direct Dial: [phone] [email or web address] Website: [email or web address]

## 2020-12-09 ENCOUNTER — Ambulatory Visit (INDEPENDENT_AMBULATORY_CARE_PROVIDER_SITE_OTHER): Payer: Medicare Other | Admitting: Pharmacist

## 2020-12-09 DIAGNOSIS — I480 Paroxysmal atrial fibrillation: Secondary | ICD-10-CM

## 2020-12-09 NOTE — Progress Notes (Signed)
Chronic Care Management Pharmacy Note  12/09/2020 Name:  Cameron Roman MRN:  [id number] DOB:  December 15, 1950  Summary: AFIB, CVA HISTORY  Recommendations/Changes made from today's visit: Atrial Fibrillation: controlled; current rate/rhythm control-n/a-->follows with [facility]; anticoagulant treatment: eliquis Patient stable and tolerating well--taking twice daily Expensive in coverage gap CHADS2VASc score: 5 Home blood pressure, heart rate readings:  controlled Assessed patient finances. Will enroll patient in BMS patient assistance  Follow Up Plan: Telephone follow up appointment with care management team member scheduled for: 6 weeks  Subjective: Cameron Roman is an 70 y.o. year old male who is a primary patient of Dettinger, [name], MD.  The CCM team was consulted for assistance with disease management and care coordination needs.    Engaged with patient by telephone for initial visit in response to provider referral for pharmacy case management and/or care coordination services.   Consent to Services:  The patient was given information about Chronic Care Management services, agreed to services, and gave verbal consent prior to initiation of services.  Please see initial visit note for detailed documentation.   Patient Care Team: Dettinger, [name], MD as PCP - General (Family Medicine) Evans Lance, MD as PCP - Electrophysiology (Cardiology) Lavera Guise, [facility] as [place] Management (Pharmacist)  Objective:  Lab Results  Component Value Date   CREATININE 0.88 08/03/2020   CREATININE 0.81 11/21/2019   CREATININE 0.73 09/25/2019    Lab Results  Component Value Date   HGBA1C 5.6 08/03/2020   Last diabetic Eye exam:  Lab Results  Component Value Date/Time   HMDIABEYEEXA No Retinopathy 01/18/2016 03:03 PM    Last diabetic Foot exam: No results found for: HMDIABFOOTEX      Component Value Date/Time   CHOL 123 08/03/2020 0927   CHOL  127 08/30/2012 0955   TRIG 129 08/03/2020 0927   TRIG 301 (H) 04/16/2013 0917   TRIG 166 (H) 08/30/2012 0955   HDL 38 (L) 08/03/2020 0927   HDL 38 (L) 04/16/2013 0917   HDL 39 (L) 08/30/2012 0955   CHOLHDL 3.2 08/03/2020 0927   CHOLHDL 4.1 09/12/2015 0440   VLDL 34 09/12/2015 0440   LDLCALC 62 08/03/2020 0927   LDLCALC 58 04/16/2013 0917   LDLCALC 55 08/30/2012 0955    Hepatic Function Latest Ref Rng & Units 08/03/2020 11/21/2019 09/25/2019  Total Protein 6.0 - 8.5 g/dL 7.5 7.3 7.1  Albumin 3.8 - 4.8 g/dL 4.3 4.3 3.7  AST 0 - 40 IU/L _0 ALT 0 - 44 IU/L _1 Alk Phosphatase 44 - 121 IU/L 100 91 65  Total Bilirubin 0.0 - 1.2 mg/dL 0.3 0.4 0.7    Lab Results  Component Value Date/Time   TSH 2.358 08/30/2012 09:55 AM    CBC Latest Ref Rng & Units 08/03/2020 11/21/2019 09/25/2019  WBC 3.4 - 10.8 x10E3/uL 8.9 9.4 8.8  Hemoglobin 13.0 - 17.7 g/dL 12.6(L) 12.7(L) 13.3  Hematocrit 37.5 - 51.0 % 38.0 38.4 39.2  Platelets 150 - 450 x10E3/uL 355 309 312    No results found for: VD25OH  Clinical ASCVD: Yes  The ASCVD Risk score (Arnett DK, et al., 2019) failed to calculate for the following reasons:   The patient has a prior MI or stroke diagnosis    Other: (CHADS2VASc if Afib, PHQ9 if depression, MMRC or CAT for COPD, ACT, DEXA)  Social History   Tobacco Use  Smoking Status Former   Packs/day: 1.00  Years: 41.00   Pack years: 41.00   Types: Cigarettes   Quit date: 06/29/2002   Years since quitting: 18.4  Smokeless Tobacco Never  Tobacco Comments   quit 19yr ago   BP Readings from Last 3 Encounters:  08/03/20 (!) 144/68  02/14/20 108/68  11/21/19 (!) 141/76   Pulse Readings from Last 3 Encounters:  08/03/20 69  02/14/20 68  11/21/19 82   Wt Readings from Last 3 Encounters:  11/19/20 154 lb (69.9 kg)  08/03/20 154 lb (69.9 kg)  02/14/20 154 lb (69.9 kg)    Assessment: Review of patient past medical history, allergies, medications, health status,  including review of consultants reports, laboratory and other test data, was performed as part of comprehensive evaluation and provision of chronic care management services.   SDOH:  (Social Determinants of Health) assessments and interventions performed:    CCM Care Plan  Allergies  Allergen Reactions   Percocet [Oxycodone-Acetaminophen] Itching   Acetaminophen    Oxycodone Hcl     Medications Reviewed Today     Reviewed by PLavera Guise [facility](Pharmacist) on 12/09/20 at [phone] Med List Status: <None>   Medication Order Taking? Sig Documenting Provider Last Dose Status Informant  ELIQUIS 5 MG TABS tablet [id number] TAKE ONE TABLET BY MOUTH TWICE DAILY TEvans Lance MD  Active   fluticasone ([facility] 50 MCG/ACT nasal spray [id number] Place 2 sprays into both nostrils daily. WVernie Shanks MD Taking Active Self  gabapentin (NEURONTIN) 300 MG capsule [id number] TAKE ONE CAPSULE TWICE TO THREE TIMES DAILY Dettinger, JFransisca Kaufmann MD  Active   HYDROcodone-acetaminophen ([facility] 10-325 MG tablet [id number] Take 1 tablet by mouth 3 (three) times daily. Meyran, KOcie Cornfield NP Taking Active   methocarbamol (ROBAXIN) 500 MG tablet [id number] Take 1 tablet (500 mg total) by mouth every 6 (six) hours as needed for muscle spasms. Meyran, KOcie Cornfield NP Taking Active   Omega-3 Fatty Acids (FISH OIL) 1000 MG CAPS [id number] Take 1,000 mg by mouth in the morning and at bedtime.  [provider] Taking Active Self  omeprazole (PRILOSEC) 20 MG capsule [id number] Take 1 capsule (20 mg total) by mouth daily. Dettinger, JFransisca Kaufmann MD  Active   pravastatin (PRAVACHOL) 20 MG tablet [id number] TAKE ONE (1) TABLET EACH DAY Dettinger, JFransisca Kaufmann MD  Active   tamsulosin (FLOMAX) 0.4 MG CAPS capsule [id number] Take 1 capsule (0.4 mg total) by mouth daily. Dettinger, JFransisca Kaufmann MD  Active             Patient Active Problem List   Diagnosis Date Noted   BPH (benign prostatic  hyperplasia) 08/03/2020   Diverticular disease 07/22/2020   Hepatic cirrhosis ([place] 07/22/2020   Type 2 diabetes mellitus ([facility] 07/22/2020   Lumbar foraminal stenosis 09/27/2019   Left leg weakness 09/11/2019   Elevated blood-pressure reading, without diagnosis of hypertension 07/31/2019   Tinnitus of both ears 05/01/2018   Paroxysmal atrial fibrillation ([facility] [place] 11/01/2016   History of CVA (cerebrovascular accident) 02/11/2016   Positive hepatitis C antibody test 12/24/2015   Insomnia 12/23/2015   B12 deficiency 09/30/2015   Cervico-occipital neuralgia 01/26/2015   HTN (hypertension) 10/21/2014   Erectile dysfunction 10/21/2014   Low back pain 05/20/2013   Lumbosacral radiculitis 05/20/2013   Neck pain, chronic 08/30/2012   Prediabetes 08/30/2012   Arthritis 08/30/2012   HLD (hyperlipidemia) 08/30/2012   Lipoma of abdominal wall 02/11/2011   PERSONAL HISTORY OF COLONIC POLYPS  07/29/2009   HEMORRHOIDS, INTERNAL 10/03/2006   ESOPHAGEAL STRICTURE 10/03/2006   GERD 10/03/2006   HIATAL HERNIA 10/03/2006   DIVERTICULOSIS, COLON 10/03/2006    Immunization History  Administered Date(s) Administered   Fluad Quad(high Dose 65+) 01/18/2019   Influenza, High Dose Seasonal PF 01/10/2018   Influenza,inj,Quad PF,6+ Mos 12/11/2012, 01/21/2014, 02/17/2015, 12/23/2015, 01/06/2017   Moderna Sars-Covid-2 Vaccination 07/12/2019, 08/15/2019   Zoster, Live 03/12/2013    Conditions to be addressed/monitored: Atrial Fibrillation  Care Plan : PHARMD MEDICATION MANAGEMENT  Updates made by Lavera Guise, [place] since 12/09/2020 12:00 AM     Problem: DISEASE PROGRESSION PREVENTION      Long-Range Goal: AFIB/CVA HISTORY   This Visit's Progress: Not on track  Priority: High  Note:   Current Barriers:  Unable to independently afford treatment regimen  Pharmacist Clinical Goal(s):  Over the next 90 days, patient will verbalize ability to afford treatment regimen through collaboration with PharmD  and provider.   Interventions: 1:1 collaboration with Dettinger, [name], MD regarding development and update of comprehensive plan of care as evidenced by provider attestation and co-signature Inter-disciplinary care team collaboration (see longitudinal plan of care) Comprehensive medication review performed; medication list updated in electronic medical record  Atrial Fibrillation: controlled; current rate/rhythm control-n/a-->follows with [facility]; anticoagulant treatment: eliquis Patient stable and tolerating well--taking twice daily Expensive in coverage gap CHADS2VASc score: 5 Home blood pressure, heart rate readings:  controlled Assessed patient finances. Will enroll patient in BMS patient assistance   Patient Goals/Self-Care Activities Over the next 90 days, patient will:  - take medications as prescribed collaborate with provider on medication access solutions  Follow Up Plan: Telephone follow up appointment with care management team member scheduled for: 6 weeks      Medication Assistance: Application for ELIQUIS/BMS  medication assistance program. in process.  Anticipated assistance start date TBD.  See plan of care for additional detail.  Patient's preferred pharmacy is:  [facility] [address], [place] [address] Phone: [phone] Fax: [phone]  Follow Up:  Patient agrees to Care Plan and Follow-up.  Plan: Telephone follow up appointment with care management team member scheduled for:  4-6 WEEKS    Regina Eck, PharmD, BCPS Clinical Pharmacist, [place]  II Phone [phone]

## 2020-12-09 NOTE — Patient Instructions (Addendum)
Visit Information  PATIENT GOALS:  Goals Addressed               This Visit's Progress     Patient Stated     AFIB/CVA HISTORY (pt-stated)        Current Barriers:  Unable to independently afford treatment regimen  Pharmacist Clinical Goal(s):  Over the next 90 days, patient will verbalize ability to afford treatment regimen through collaboration with PharmD and provider.   Interventions: 1:1 collaboration with Dettinger, [name], MD regarding development and update of comprehensive plan of care as evidenced by provider attestation and co-signature Inter-disciplinary care team collaboration (see longitudinal plan of care) Comprehensive medication review performed; medication list updated in electronic medical record  Atrial Fibrillation: controlled; current rate/rhythm control-n/a-->follows with [facility]; anticoagulant treatment: eliquis Patient stable and tolerating well--taking twice daily Expensive in coverage gap CHADS2VASc score: 5 Home blood pressure, heart rate readings:  controlled Assessed patient finances. Will enroll patient in BMS patient assistance   Patient Goals/Self-Care Activities Over the next 90 days, patient will:  - take medications as prescribed collaborate with provider on medication access solutions  Follow Up Plan: Telephone follow up appointment with care management team member scheduled for: 6 weeks         The patient verbalized understanding of instructions, educational materials, and care plan provided today and declined offer to receive copy of patient instructions, educational materials, and care plan.   Telephone follow up appointment with care management team member scheduled for:4- WEEKS  Signature Regina Eck, PharmD, BCPS Clinical Pharmacist, [place]  II Phone [phone]

## 2020-12-11 DIAGNOSIS — I480 Paroxysmal atrial fibrillation: Secondary | ICD-10-CM

## 2020-12-16 ENCOUNTER — Telehealth: Payer: Medicare Other

## 2020-12-18 ENCOUNTER — Telehealth: Payer: Self-pay | Admitting: *Deleted

## 2021-01-13 ENCOUNTER — Ambulatory Visit (INDEPENDENT_AMBULATORY_CARE_PROVIDER_SITE_OTHER): Payer: Medicare Other | Admitting: Pharmacist

## 2021-01-13 ENCOUNTER — Telehealth: Payer: Self-pay | Admitting: Pharmacist

## 2021-01-13 DIAGNOSIS — E782 Mixed hyperlipidemia: Secondary | ICD-10-CM

## 2021-01-13 DIAGNOSIS — I10 Essential (primary) hypertension: Secondary | ICD-10-CM

## 2021-01-13 DIAGNOSIS — I480 Paroxysmal atrial fibrillation: Secondary | ICD-10-CM

## 2021-01-13 NOTE — Telephone Encounter (Signed)
Can you please get eliquis samples and leave up front for patient to pick up If none in closet there are 3 boxes on top of my brown file cabinet eliquis 5mg  twice daily

## 2021-01-14 NOTE — Telephone Encounter (Signed)
Three boxes of 5mg  Eliquis left up front for pt. Pt will come by this pm to pick up

## 2021-01-25 ENCOUNTER — Other Ambulatory Visit: Payer: Self-pay | Admitting: Nurse Practitioner

## 2021-01-25 DIAGNOSIS — K7469 Other cirrhosis of liver: Secondary | ICD-10-CM | POA: Diagnosis not present

## 2021-01-28 NOTE — Progress Notes (Signed)
Chronic Care Management Pharmacy Note  01/13/2021 Name:  Cameron Roman MRN:  [id number] DOB:  18-Oct-1950  Summary: AFIB/STROKE HISTORY, HLD  Recommendations/Changes made from today's visit: Atrial Fibrillation: controlled; current rate/rhythm control-n/a-->follows with [facility] heart care; anticoagulant treatment: eliquis Patient stable and tolerating well (DENIES SIGNS/SYMPTOMS OF BLEEDING, LABS REVIEWED)--taking twice daily Expensive in coverage gap S/p cryptogenic stroke, s/p ILR which demonstrated atrial fib Per cards notes-->PAF - he is mostly maintaining NSR Stroke - NO recurrent symptoms despite not taking the ELIQUIS correctly (WAS ONLY TAKING ONCE DAILY). CONTINUE bid eliquis. CHADS2VASc score: 5 Home blood pressure: controlled; currently taking NO meds for BP Hyperlipidemia--LDL looks great <70; continue pravastatin 73m/dietary & lifestyle modifications  Lipid Panel     Component Value Date/Time   CHOL 123 08/03/2020 0927   TRIG 129 08/03/2020 0927   HDL 38 (L) 08/03/2020 0927   CHOLHDL 3.2 08/03/2020 0927   LDLCALC 62 08/03/2020 0927   LABVLDL 23 08/03/2020 0927  Assessed patient finances. Will enroll patient in [employer]MS patient assistance for eliquis   Patient Goals/Self-Care Activities Over the next 90 days, patient will:  - take medications as prescribed collaborate with provider on medication access solutions  Follow Up Plan: Telephone follow up appointment with care management team member scheduled for: 6 weeks  Subjective: Cameron Topperis an 70y.o. year old male who is a primary patient of Dettinger, JFransisca Kaufmann MD.  The CCM team was consulted for assistance with disease management and care coordination needs.    Engaged with patient by telephone for follow up visit in response to provider referral for pharmacy case management and/or care coordination services.   Consent to Services:  The patient was given information about Chronic Care Management services,  agreed to services, and gave verbal consent prior to initiation of services.  Please see initial visit note for detailed documentation.   Patient Care Team: Dettinger, JFransisca Kaufmann MD as PCP - General (Family Medicine) TEvans Lance MD as PCP - Electrophysiology (Cardiology) PLavera Guise R[facility] Pharmacist (Family Medicine)  Objective:  Lab Results  Component Value Date   CREATININE 0.88 08/03/2020   CREATININE 0.81 11/21/2019   CREATININE 0.73 09/25/2019    Lab Results  Component Value Date   HGBA1C 5.6 08/03/2020   Last diabetic Eye exam:  Lab Results  Component Value Date/Time   HMDIABEYEEXA No Retinopathy 01/18/2016 03:03 PM    Last diabetic Foot exam: No results found for: HMDIABFOOTEX      Component Value Date/Time   CHOL 123 08/03/2020 0927   CHOL 127 08/30/2012 0955   TRIG 129 08/03/2020 0927   TRIG 301 (H) 04/16/2013 0917   TRIG 166 (H) 08/30/2012 0955   HDL 38 (L) 08/03/2020 0927   HDL 38 (L) 04/16/2013 0917   HDL 39 (L) 08/30/2012 0955   CHOLHDL 3.2 08/03/2020 0927   CHOLHDL 4.1 09/12/2015 0440   VLDL 34 09/12/2015 0440   LDLCALC 62 08/03/2020 0927   LDLCALC 58 04/16/2013 0917   LDLCALC 55 08/30/2012 0955    Hepatic Function Latest Ref Rng & Units 08/03/2020 11/21/2019 09/25/2019  Total Protein 6.0 - 8.5 g/dL 7.5 7.3 7.1  Albumin 3.8 - 4.8 g/dL 4.3 4.3 3.7  AST 0 - 40 IU/L _0 ALT 0 - 44 IU/L _1 Alk Phosphatase 44 - 121 IU/L 100 91 65  Total Bilirubin 0.0 - 1.2 mg/dL 0.3 0.4 0.7    Lab Results  Component Value Date/Time  TSH 2.358 08/30/2012 09:55 AM    CBC Latest Ref Rng & Units 08/03/2020 11/21/2019 09/25/2019  WBC 3.4 - 10.8 x10E3/uL 8.9 9.4 8.8  Hemoglobin 13.0 - 17.7 g/dL 12.6(L) 12.7(L) 13.3  Hematocrit 37.5 - 51.0 % 38.0 38.4 39.2  Platelets 150 - 450 x10E3/uL 355 309 312    No results found for: VD25OH  Clinical ASCVD: Yes  The ASCVD Risk score (Arnett DK, et al., 2019) failed to calculate for the following reasons:   The  valid total cholesterol range is 130 to 320 mg/dL    Other: (CHADS2VASc if Afib, PHQ9 if depression, MMRC or CAT for COPD, ACT, DEXA)  Social History   Tobacco Use  Smoking Status Former   Packs/day: 1.00   Years: 41.00   Pack years: 41.00   Types: Cigarettes   Quit date: 06/29/2002   Years since quitting: 18.5  Smokeless Tobacco Never  Tobacco Comments   quit 75yr ago   BP Readings from Last 3 Encounters:  08/03/20 (!) 144/68  02/14/20 108/68  11/21/19 (!) 141/76   Pulse Readings from Last 3 Encounters:  08/03/20 69  02/14/20 68  11/21/19 82   Wt Readings from Last 3 Encounters:  11/19/20 154 lb (69.9 kg)  08/03/20 154 lb (69.9 kg)  02/14/20 154 lb (69.9 kg)    Assessment: Review of patient past medical history, allergies, medications, health status, including review of consultants reports, laboratory and other test data, was performed as part of comprehensive evaluation and provision of chronic care management services.   SDOH:  (Social Determinants of Health) assessments and interventions performed:    CCM Care Plan  Allergies  Allergen Reactions   Percocet [Oxycodone-Acetaminophen] Itching   Acetaminophen    Oxycodone Hcl     Medications Reviewed Today     Reviewed by PLavera Guise [facility](Pharmacist) on 01/28/21 at 122 Med List Status: <None>   Medication Order Taking? Sig Documenting Provider Last Dose Status Informant  ELIQUIS 5 MG TABS tablet [id number] TAKE ONE TABLET BY MOUTH TWICE DAILY TEvans Lance MD  Active   fluticasone ([facility] 50 MCG/ACT nasal spray [id number] Place 2 sprays into both nostrils daily. WVernie Shanks MD Taking Active Self  gabapentin (NEURONTIN) 300 MG capsule [id number] TAKE ONE CAPSULE TWICE TO THREE TIMES DAILY Dettinger, JFransisca Kaufmann MD  Active   HYDROcodone-acetaminophen ([facility] 10-325 MG tablet [id number] Take 1 tablet by mouth 3 (three) times daily. Meyran, KOcie Cornfield NP Taking Active   methocarbamol  (ROBAXIN) 500 MG tablet [id number] Take 1 tablet (500 mg total) by mouth every 6 (six) hours as needed for muscle spasms. Meyran, KOcie Cornfield NP Taking Active   Omega-3 Fatty Acids (FISH OIL) 1000 MG CAPS [id number] Take 1,000 mg by mouth in the morning and at bedtime.  [provider] Taking Active Self  omeprazole (PRILOSEC) 20 MG capsule [id number] Take 1 capsule (20 mg total) by mouth daily. Dettinger, JFransisca Kaufmann MD  Active   pravastatin (PRAVACHOL) 20 MG tablet [id number] TAKE ONE (1) TABLET EACH DAY Dettinger, JFransisca Kaufmann MD  Active   tamsulosin (FLOMAX) 0.4 MG CAPS capsule [id number] Take 1 capsule (0.4 mg total) by mouth daily. 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   [place] cirrhosis [place] 07/22/[place]  Type 2 diabetes mellitus ([place]) 07/22/2020   Lumbar foraminal stenosis 09/27/2019   Left leg weakness 09/11/2019   Elevated blood-pressure reading, without diagnosis of hypertension 07/31/2019   Tinnitus of both ears 05/01/2018   Paroxysmal atrial fibrillation ([place]) 11/01/2016   History of CVA (cerebrovascular accident) 02/11/2016   Positive hepatitis C antibody test 12/24/2015   Insomnia 12/23/2015   B12 deficiency 09/30/2015   Cervico-occipital neuralgia 01/26/2015   HTN (hypertension) 10/21/2014   Erectile dysfunction 10/21/2014   Low back pain 05/20/2013   Lumbosacral radiculitis 05/20/2013   Neck pain, chronic 08/30/2012   Prediabetes 08/30/2012   Arthritis 08/30/2012   HLD (hyperlipidemia) 08/30/2012   Lipoma of abdominal wall 02/11/2011   PERSONAL HISTORY OF COLONIC POLYPS 07/29/2009   HEMORRHOIDS, INTERNAL 10/03/2006   ESOPHAGEAL STRICTURE 10/03/2006   GERD 10/03/2006   HIATAL HERNIA 10/03/2006   DIVERTICULOSIS, COLON 10/03/2006    Immunization History  Administered Date(s) Administered   Fluad Quad(high  Dose 65+) 01/18/2019   Influenza, High Dose Seasonal PF 01/10/2018   Influenza,inj,Quad PF,6+ Mos 12/11/2012, 01/21/2014, 02/17/2015, 12/23/2015, 01/06/2017   Moderna Sars-Covid-2 Vaccination 07/12/2019, 08/15/2019   Zoster, Live 03/12/2013    Conditions to be addressed/monitored: Atrial Fibrillation and HLD  Care Plan : PHARMD MEDICATION MANAGEMENT  Updates made by Lavera Guise, [place] since 01/28/2021 12:00 AM     Problem: DISEASE PROGRESSION PREVENTION      Long-Range Goal: AFIB/STROKE HISTORY, HLD   Recent Progress: Not on track  Priority: High  Note:   Current Barriers:  Unable to independently afford treatment regimen  Pharmacist Clinical Goal(s):  Over the next 90 days, patient will verbalize ability to afford treatment regimen through collaboration with PharmD and provider.   Interventions: 1:1 collaboration with Dettinger, [name], MD regarding development and update of comprehensive plan of care as evidenced by provider attestation and co-signature Inter-disciplinary care team collaboration (see longitudinal plan of care) Comprehensive medication review performed; medication list updated in electronic medical record  Atrial Fibrillation: controlled; current rate/rhythm control-n/a-->follows with [facility] care; anticoagulant treatment: eliquis Patient stable and tolerating well (DENIES SIGNS/SYMPTOMS OF BLEEDING, LABS REVIEWED)--taking twice daily Expensive in coverage gap S/p cryptogenic stroke, s/p ILR which demonstrated atrial fib Per cards notes-->PAF - he is mostly maintaining NSR Stroke - NO recurrent symptoms despite not taking the ELIQUIS correctly (WAS ONLY TAKING ONCE DAILY). CONTINUE bid eliquis. CHADS2VASc score: 5 Home blood pressure: controlled; currently taking NO meds for BP Hyperlipidemia--LDL looks great <70; continue pravastatin 85m/dietary & lifestyle modifications  Lipid Panel     Component Value Date/Time   CHOL 123 08/03/2020 0927    TRIG 129 08/03/2020 0927   HDL 38 (L) 08/03/2020 0927   CHOLHDL 3.2 08/03/2020 0927   LDLCALC 62 08/03/2020 0927   LABVLDL 23 08/03/2020 0927  Assessed patient finances. Will enroll patient in BMS patient assistance for eliquis   Patient Goals/Self-Care Activities Over the next 90 days, patient will:  - take medications as prescribed collaborate with provider on medication access solutions  Follow Up Plan: Telephone follow up appointment with care management team member scheduled for: 6 weeks      Medication Assistance: Application for [employer] medication assistance program. in process.  Anticipated assistance start date TBD.  See plan of care for additional detail.  [facility]  [place] [id number]: [phone]: [phone] Follow Up:  Patient agrees to Care Plan  and Follow-up.  Plan: Telephone follow up appointment with care management team member scheduled for:  2 MONTHS    Regina Eck, PharmD, BCPS Clinical Pharmacist, [place]  II Phone [phone]

## 2021-01-28 NOTE — Patient Instructions (Addendum)
Visit Information  Thank you for taking time to visit with me today. Please don't hesitate to contact me if I can be of assistance to you before our next scheduled telephone appointment.  Telephone follow up appointment with care management team member scheduled for: 2 months  If you need to cancel or re-schedule our visit, please call [phone] and our care guide team will be happy to assist you.  Following is a list of the goals we discussed today:  Care Plan : PHARMD MEDICATION MANAGEMENT  Updates made by Lavera Guise, RPH since 01/28/2021 12:00 AM     Problem: DISEASE PROGRESSION PREVENTION      Long-Range Goal: AFIB/STROKE HISTORY, HLD   Recent Progress: Not on track  Priority: High  Note:   Current Barriers:  Unable to independently afford treatment regimen  Pharmacist Clinical Goal(s):  Over the next 90 days, patient will verbalize ability to afford treatment regimen through collaboration with PharmD and provider.   Interventions: 1:1 collaboration with Dettinger, [name], MD regarding development and update of comprehensive plan of care as evidenced by provider attestation and co-signature Inter-disciplinary care team collaboration (see longitudinal plan of care) Comprehensive medication review performed; medication list updated in electronic medical record  Atrial Fibrillation: controlled; current rate/rhythm control-n/a-->follows with [facility]; anticoagulant treatment: eliquis Patient stable and tolerating well (DENIES SIGNS/SYMPTOMS OF BLEEDING, LABS REVIEWED)--taking twice daily Expensive in coverage gap S/p cryptogenic stroke, s/p ILR which demonstrated atrial fib Per cards notes-->PAF - he is mostly maintaining NSR Stroke - NO recurrent symptoms despite not taking the ELIQUIS correctly (WAS ONLY TAKING ONCE DAILY). CONTINUE bid eliquis. CHADS2VASc score: 5 Home blood pressure: controlled; currently taking NO meds for BP Hyperlipidemia--LDL looks  great <70; continue pravastatin 20mg /dietary & lifestyle modifications  Lipid Panel     Component Value Date/Time   CHOL 123 08/03/2020 0927   TRIG 129 08/03/2020 0927   HDL 38 (L) 08/03/2020 0927   CHOLHDL 3.2 08/03/2020 0927   LDLCALC 62 08/03/2020 0927   LABVLDL 23 08/03/2020 0927  Assessed patient finances. Will enroll patient in BMS patient assistance for eliquis   Patient Goals/Self-Care Activities Over the next 90 days, patient will:  - take medications as prescribed collaborate with provider on medication access solutions  Follow Up Plan: Telephone follow up appointment with care management team member scheduled for: 6 weeks      The patient verbalized understanding of instructions, educational materials, and care plan provided today and declined offer to receive copy of patient instructions, educational materials, and care plan.     Regina Eck, PharmD, BCPS Clinical Pharmacist, [place]  II Phone [phone]

## 2021-02-01 ENCOUNTER — Encounter: Payer: Self-pay | Admitting: Family Medicine

## 2021-02-01 ENCOUNTER — Other Ambulatory Visit: Payer: Self-pay

## 2021-02-01 ENCOUNTER — Ambulatory Visit (INDEPENDENT_AMBULATORY_CARE_PROVIDER_SITE_OTHER): Payer: Medicare Other | Admitting: Family Medicine

## 2021-02-01 VITALS — BP 162/77 | HR 72 | Ht 70.0 in | Wt 156.0 lb

## 2021-02-01 DIAGNOSIS — E782 Mixed hyperlipidemia: Secondary | ICD-10-CM | POA: Diagnosis not present

## 2021-02-01 DIAGNOSIS — I480 Paroxysmal atrial fibrillation: Secondary | ICD-10-CM

## 2021-02-01 DIAGNOSIS — R739 Hyperglycemia, unspecified: Secondary | ICD-10-CM | POA: Diagnosis not present

## 2021-02-01 DIAGNOSIS — R7303 Prediabetes: Secondary | ICD-10-CM

## 2021-02-01 DIAGNOSIS — Z23 Encounter for immunization: Secondary | ICD-10-CM | POA: Diagnosis not present

## 2021-02-01 DIAGNOSIS — I10 Essential (primary) hypertension: Secondary | ICD-10-CM

## 2021-02-01 LAB — CMP14+EGFR
ALT: 8 IU/L (ref 0–44)
AST: 12 IU/L (ref 0–40)
Albumin/Globulin Ratio: 1.5 (ref 1.2–2.2)
Albumin: 4.3 g/dL (ref 3.8–4.8)
Alkaline Phosphatase: 99 IU/L (ref 44–121)
BUN/Creatinine Ratio: 11 (ref 10–24)
BUN: 9 mg/dL (ref 8–27)
Bilirubin Total: 0.3 mg/dL (ref 0.0–1.2)
CO2: 25 mmol/L (ref 20–29)
Calcium: 9.7 mg/dL (ref 8.6–10.2)
Chloride: 102 mmol/L (ref 96–106)
Creatinine, Ser: 0.84 mg/dL (ref 0.76–1.27)
Globulin, Total: 2.8 g/dL (ref 1.5–4.5)
Glucose: 112 mg/dL — ABNORMAL HIGH (ref 70–99)
Potassium: 4.8 mmol/L (ref 3.5–5.2)
Sodium: 139 mmol/L (ref 134–144)
Total Protein: 7.1 g/dL (ref 6.0–8.5)
eGFR: 94 mL/min/{1.73_m2} (ref >59)

## 2021-02-01 LAB — LIPID PANEL
Chol/HDL Ratio: 3.6 ratio (ref 0.0–5.0)
Cholesterol, Total: 132 mg/dL (ref 100–199)
HDL: 37 mg/dL — ABNORMAL LOW (ref >39)
LDL Chol Calc (NIH): 74 mg/dL (ref 0–99)
Triglycerides: 113 mg/dL (ref 0–149)
VLDL Cholesterol Cal: 21 mg/dL (ref 5–40)

## 2021-02-01 LAB — CBC WITH DIFFERENTIAL/PLATELET
Basophils Absolute: 0.1 10*3/uL (ref 0.0–0.2)
Basos: 1 %
EOS (ABSOLUTE): 0.3 10*3/uL (ref 0.0–0.4)
Eos: 3 %
Hematocrit: 40 % (ref 37.5–51.0)
Hemoglobin: 13.6 g/dL (ref 13.0–17.7)
Immature Grans (Abs): 0 10*3/uL (ref 0.0–0.1)
Immature Granulocytes: 0 %
Lymphocytes Absolute: 5.5 10*3/uL — ABNORMAL HIGH (ref 0.7–3.1)
Lymphs: 44 %
MCH: 32.5 pg (ref 26.6–33.0)
MCHC: 34 g/dL (ref 31.5–35.7)
MCV: 96 fL (ref 79–97)
Monocytes Absolute: 1 10*3/uL — ABNORMAL HIGH (ref 0.1–0.9)
Monocytes: 8 %
Neutrophils Absolute: 5.4 10*3/uL (ref 1.4–7.0)
Neutrophils: 44 %
Platelets: 380 10*3/uL (ref 150–450)
RBC: 4.18 x10E6/uL (ref 4.14–5.80)
RDW: 13.4 % (ref 11.6–15.4)
WBC: 12.3 10*3/uL — ABNORMAL HIGH (ref 3.4–10.8)

## 2021-02-01 LAB — BAYER DCA HB A1C WAIVED: HB A1C (BAYER DCA - WAIVED): 4.8 % (ref 4.8–5.6)

## 2021-02-01 NOTE — Progress Notes (Signed)
BP (!) 162/77   Pulse 72   Ht 5' 10"  (1.778 m)   Wt 156 lb (70.8 kg)   SpO2 98%   BMI 22.38 kg/m    Subjective:   Patient ID: Cameron Roman, male    DOB: 01-Jul-1950, 70 y.o.   MRN: [id number]  HPI: Cameron Roman is a 70 y.o. male presenting on 02/01/2021 for Medical Management of Chronic Issues and Hypertension   HPI Prediabetes Patient comes in today for recheck of his diabetes. Patient has been currently taking no medicine, diet controlled, A1c today is 4.8. Patient is not currently on an ACE inhibitor/ARB. Patient has not seen an ophthalmologist this year. Patient denies any issues with their feet. The symptom started onset as an adult hypertension and hyperlipidemia ARE RELATED TO DM   Hypertension and A. fib Patient is currently on no medicine currently, and their blood pressure today is 162/77 and 157/81. Patient denies any lightheadedness or dizziness. Patient denies headaches, blurred vision, chest pains, shortness of breath, or weakness. Denies any side effects from medication and is content with current medication.   Hyperlipidemia Patient is coming in for recheck of his hyperlipidemia. The patient is currently taking pravastatin. They deny any issues with myalgias or history of liver damage from it. They deny any focal numbness or weakness or chest pain.   Relevant past medical, surgical, family and social history reviewed and updated as indicated. Interim medical history since our last visit reviewed. Allergies and medications reviewed and updated.  Review of Systems  Constitutional:  Negative for chills and fever.  Eyes:  Negative for visual disturbance.  Respiratory:  Negative for shortness of breath and wheezing.   Cardiovascular:  Negative for chest pain and leg swelling.  Musculoskeletal:  Negative for back pain and gait problem.  Skin:  Negative for rash.  Neurological:  Negative for dizziness, weakness and light-headedness.  All other systems reviewed and are  negative.  Per HPI unless specifically indicated above   Allergies as of 02/01/2021       Reactions   Percocet [oxycodone-acetaminophen] Itching   Acetaminophen    Oxycodone Hcl         Medication List        Accurate as of February 01, 2021  8:48 AM. If you have any questions, ask your nurse or doctor.          Eliquis 5 MG Tabs tablet Generic drug: apixaban TAKE ONE TABLET BY MOUTH TWICE DAILY   Fish Oil 1000 MG Caps Take 1,000 mg by mouth in the morning and at bedtime.   fluticasone 50 MCG/ACT nasal spray Commonly known as: FLONASE Place 2 sprays into both nostrils daily.   gabapentin 300 MG capsule Commonly known as: NEURONTIN TAKE ONE CAPSULE TWICE TO THREE TIMES DAILY   HYDROcodone-acetaminophen 10-325 MG tablet Commonly known as: NORCO Take 1 tablet by mouth 3 (three) times daily.   methocarbamol 500 MG tablet Commonly known as: ROBAXIN Take 1 tablet (500 mg total) by mouth every 6 (six) hours as needed for muscle spasms.   omeprazole 20 MG capsule Commonly known as: PRILOSEC Take 1 capsule (20 mg total) by mouth daily.   pravastatin 20 MG tablet Commonly known as: PRAVACHOL TAKE ONE (1) TABLET EACH DAY   tamsulosin 0.4 MG Caps capsule Commonly known as: FLOMAX Take 1 capsule (0.4 mg total) by mouth daily.         Objective:   BP (!) 180/82   Pulse 72  Ht 5' 10"  (1.778 m)   Wt 156 lb (70.8 kg)   SpO2 98%   BMI 22.38 kg/m   Wt Readings from Last 3 Encounters:  02/01/21 156 lb (70.8 kg)  11/19/20 154 lb (69.9 kg)  08/03/20 154 lb (69.9 kg)    Physical Exam Vitals and nursing note reviewed.  Constitutional:      General: He is not in acute distress.    Appearance: He is well-developed. He is not diaphoretic.  Eyes:     General: No scleral icterus.    Conjunctiva/sclera: Conjunctivae normal.  Neck:     Thyroid: No thyromegaly.  Cardiovascular:     Rate and Rhythm: Normal rate. Rhythm irregular.     Heart sounds: Normal  heart sounds. No murmur heard. Pulmonary:     Effort: Pulmonary effort is normal. No respiratory distress.     Breath sounds: Normal breath sounds. No wheezing.  Musculoskeletal:        General: Normal range of motion.     Cervical back: Neck supple.  Lymphadenopathy:     Cervical: No cervical adenopathy.  Skin:    General: Skin is warm and dry.     Findings: No rash.  Neurological:     Mental Status: He is alert and oriented to person, place, and time.     Coordination: Coordination normal.  Psychiatric:        Behavior: Behavior normal.      Assessment & Plan:   Problem List Items Addressed This Visit       Cardiovascular and Mediastinum   HTN (hypertension) - Primary   Relevant Orders   CBC with Differential/Platelet   CMP14+EGFR   Lipid panel   Bayer DCA Hb A1c Waived     Other   HLD (hyperlipidemia)   Relevant Orders   CBC with Differential/Platelet   CMP14+EGFR   Lipid panel   Bayer DCA Hb A1c Waived   Other Visit Diagnoses     Need for immunization against influenza       Relevant Orders   Flu Vaccine QUAD High Dose(Fluad) (Completed)       Patient says his blood pressure is good at home, is elevated today.  He is going to see cardiology later today so we will hold off on doing anything until he sees them.  A1c was 4.8 looks good.  Patient says he gets occasional palpitations about 4 times in the past 6 months.  Sees cardiology later today Follow up plan: Return in about 6 months (around 08/01/2021), or if symptoms worsen or fail to improve, for Hypertension and cholesterol..  Counseling provided for all of the vaccine components Orders Placed This Encounter  Procedures   Flu Vaccine QUAD High Dose(Fluad)   CBC with Differential/Platelet   CMP14+EGFR   Lipid panel   Bayer DCA Hb A1c Waived    Caryl Pina, MD [facility] 02/01/2021, 8:48 AM

## 2021-02-09 ENCOUNTER — Ambulatory Visit
Admission: RE | Admit: 2021-02-09 | Discharge: 2021-02-09 | Disposition: A | Discharge status: Home / Self Care | Payer: Medicare Other | Source: Ambulatory Visit | Attending: Nurse Practitioner | Admitting: Nurse Practitioner

## 2021-02-09 DIAGNOSIS — K7469 Other cirrhosis of liver: Secondary | ICD-10-CM

## 2021-02-09 DIAGNOSIS — K8020 Calculus of gallbladder without cholecystitis without obstruction: Secondary | ICD-10-CM | POA: Diagnosis not present

## 2021-02-09 DIAGNOSIS — K7460 Unspecified cirrhosis of liver: Secondary | ICD-10-CM | POA: Diagnosis not present

## 2021-02-10 DIAGNOSIS — I480 Paroxysmal atrial fibrillation: Secondary | ICD-10-CM | POA: Diagnosis not present

## 2021-02-10 DIAGNOSIS — I10 Essential (primary) hypertension: Secondary | ICD-10-CM

## 2021-02-10 DIAGNOSIS — E782 Mixed hyperlipidemia: Secondary | ICD-10-CM | POA: Diagnosis not present

## 2021-02-16 ENCOUNTER — Ambulatory Visit: Payer: Medicare Other | Admitting: Internal Medicine

## 2021-02-16 ENCOUNTER — Encounter: Payer: Self-pay | Admitting: Internal Medicine

## 2021-02-17 ENCOUNTER — Other Ambulatory Visit: Payer: Self-pay | Admitting: Family Medicine

## 2021-03-26 DIAGNOSIS — K7469 Other cirrhosis of liver: Secondary | ICD-10-CM | POA: Diagnosis not present

## 2021-06-15 ENCOUNTER — Encounter: Payer: Self-pay | Admitting: Internal Medicine

## 2021-06-15 ENCOUNTER — Ambulatory Visit: Payer: Medicare Other | Admitting: Internal Medicine

## 2021-06-15 VITALS — BP 142/80 | HR 92 | Ht 71.0 in | Wt 146.8 lb

## 2021-06-15 DIAGNOSIS — I480 Paroxysmal atrial fibrillation: Secondary | ICD-10-CM | POA: Diagnosis not present

## 2021-06-15 NOTE — Progress Notes (Signed)
? ? ? ? ?HPI ?[name] returns today for followup of atrial fib. He is a pleasant 71 yo man with a cryptogenic stroke, s/p ILR which demonstrated atrial fib. He has been prescribed eliquis but was only taking one tab a day but now is taking twice. His bp is not being checked. He has not had any bleeding. No chest pain or sob. No edema. ?Allergies  ?Allergen Reactions  ? Percocet [Oxycodone-Acetaminophen] Itching  ? Acetaminophen   ? Oxycodone Hcl   ? ? ? ?Current Outpatient Medications  ?Medication Sig Dispense Refill  ? ELIQUIS 5 MG TABS tablet TAKE ONE TABLET BY MOUTH TWICE DAILY 60 tablet 11  ? fluticasone (FLONASE) 50 MCG/ACT nasal spray Place 2 sprays into both nostrils daily. 16 g 6  ? gabapentin (NEURONTIN) 300 MG capsule TAKE ONE CAPSULE TWICE TO THREE TIMES DAILY 90 capsule 2  ? HYDROcodone-acetaminophen (NORCO) 10-325 MG tablet Take 1 tablet by mouth 3 (three) times daily. 30 tablet 0  ? methocarbamol (ROBAXIN) 500 MG tablet Take 1 tablet (500 mg total) by mouth every 6 (six) hours as needed for muscle spasms. 40 tablet 0  ? Omega-3 Fatty Acids (FISH OIL) 1000 MG CAPS Take 1,000 mg by mouth in the morning and at bedtime.     ? omeprazole (PRILOSEC) 20 MG capsule Take 1 capsule (20 mg total) by mouth daily. 90 capsule 3  ? pravastatin (PRAVACHOL) 20 MG tablet TAKE ONE (1) TABLET EACH DAY 90 tablet 3  ? tamsulosin (FLOMAX) 0.4 MG CAPS capsule Take 1 capsule (0.4 mg total) by mouth daily. 90 capsule 3  ? ?No current facility-administered medications for this visit.  ? ? ? ?Past Medical History:  ?Diagnosis Date  ? Arthritis   ? Chronic back pain   ? buldging disc  ? Chronic neck pain   ? ruptured disc  ? Cough   ? hx smoking  ? Diverticulosis   ? Esophageal stricture   ? GERD (gastroesophageal reflux disease)   ? takes Prilosec daily  ? H/O hiatal hernia   ? Hearing loss   ? Hepatitis-C 2017  ? Hiatal hernia   ? History of colon polyps   ? Insomnia   ? takes Elavil nightly  ? Lipoma of abdominal wall  02/11/2011  ? Stroke [facility])   ? Type 2 diabetes mellitus ([place]) 07/22/2020  ? ? ?ROS: ? ? All systems reviewed and negative except as noted in the HPI. ? ? ?Past Surgical History:  ?Procedure Laterality Date  ? ANTERIOR CERVICAL DECOMP/DISCECTOMY FUSION  04/12/2011  ? Procedure: ANTERIOR CERVICAL DECOMPRESSION/DISCECTOMY FUSION 2 LEVELS;  Surgeon: Peggyann Shoals, MD;  Location: [place] [facility];  Service: Neurosurgery;  Laterality: N/A;  exploration of Cervical four - seven  fusion with redo Cervical six- seven, cervical three- four anterior cervical decompression with fusion interbody prothesis plating and bonegraft and C34 anterior cervical decompression with inte  ? APPENDECTOMY    ? at age 30  ? CARDIAC CATHETERIZATION  06/03/04  ? COLONOSCOPY    ? EP IMPLANTABLE DEVICE N/A 09/14/2015  ? Procedure: Loop Recorder Insertion;  Surgeon: Evans Lance, MD;  Location: [place] [facility];  Service: Cardiovascular;  Laterality: N/A;  ? HAND SURGERY Right 2005  ? right  ? INNER EAR SURGERY Bilateral   ? bil;titanium in both ears  ? lower back surgery  2009  ? had plates and screws inserted  ? NECK SURGERY  2009  ? had insertion of  plates  and screws  ? TEE WITHOUT CARDIOVERSION N/A 09/14/2015  ? Procedure: TRANSESOPHAGEAL ECHOCARDIOGRAM (TEE);  Surgeon: Thayer Headings, MD;  Location: [place];  Service: Cardiovascular;  Laterality: N/A;  ? TRANSFORAMINAL LUMBAR INTERBODY FUSION (TLIF) WITH PEDICLE SCREW FIXATION 1 LEVEL Left 09/27/2019  ? Procedure: Left Lumbar 5 Sacral 1 Transforaminal lumbar interbody fusion with exploration/removal of adjacent level hardware;  Surgeon: Erline Levine, MD;  Location: [place];  Service: Neurosurgery;  Laterality: Left;  3C/RM 19  ? ? ? ?Family History  ?Problem Relation Age of Onset  ? Colon cancer Father   ? Other Mother   ?     Perforated bowel  ? Colon cancer Maternal Grandmother   ? Colon cancer Maternal Grandfather   ? Heart disease Maternal Uncle   ? Stroke Maternal Uncle   ? Cancer Brother    ? Healthy Son   ? Healthy Daughter   ? Anesthesia problems Neg Hx   ? Hypotension Neg Hx   ? Malignant hyperthermia Neg Hx   ? Pseudochol deficiency Neg Hx   ? ? ? ?Social History  ? ?Socioeconomic History  ? Marital status: Widowed  ?  Spouse name: Pamala Hurry  ? Number of children: 2  ? Years of education: 8th Grade  ? Highest education level: 8th grade  ?Occupational History  ? Occupation: disabled  ?  Employer: DISABLED  ?Tobacco Use  ? Smoking status: Former  ?  Packs/day: 1.00  ?  Years: 41.00  ?  Pack years: 41.00  ?  Types: Cigarettes  ?  Quit date: 06/29/2002  ?  Years since quitting: 18.9  ? Smokeless tobacco: Never  ? Tobacco comments:  ?  quit 29yrs ago  ?Vaping Use  ? Vaping Use: Never used  ?Substance and Sexual Activity  ? Alcohol use: Not Currently  ?  Alcohol/week: 1.0 standard drink  ?  Types: 1 Cans of beer per week  ?  Comment: occasional less than one a week  ? Drug use: No  ? Sexual activity: Yes  ?  Birth control/protection: None  ?Other Topics Concern  ? Not on file  ?Social History Narrative  ? Lives alone, his wife passed away 2019-02-03.  Has 2 children.    ? Currently does not work.  On disability for wrist pain since early 2000s.  ? Formerly a Administrator.  ? ?Social Determinants of Health  ? ?Financial Resource Strain: High Risk  ? Difficulty of Paying Living Expenses: Hard  ?Food Insecurity: No Food Insecurity  ? Worried About Charity fundraiser in the Last Year: Never true  ? Ran Out of Food in the Last Year: Never true  ?Transportation Needs: No Transportation Needs  ? Lack of Transportation (Medical): No  ? Lack of Transportation (Non-Medical): No  ?Physical Activity: Insufficiently Active  ? Days of Exercise per Week: 7 days  ? Minutes of Exercise per Session: 20 min  ?Stress: No Stress Concern Present  ? Feeling of Stress : Only a little  ?Social Connections: Socially Isolated  ? Frequency of Communication with Friends and Family: More than three times a week  ? Frequency of Social  Gatherings with Friends and Family: More than three times a week  ? Attends Religious Services: Never  ? Active Member of Clubs or Organizations: No  ? Attends Archivist Meetings: Never  ? Marital Status: Widowed  ?Intimate Partner Violence: Not At Risk  ? Fear of Current or Ex-Partner: No  ? Emotionally Abused:  No  ? Physically Abused: No  ? Sexually Abused: No  ? ? ? ?BP (!) 142/80   Pulse 92   Ht 5\' 11"  (1.803 m)   Wt 146 lb 12.8 oz (66.6 kg)   SpO2 97%   BMI 20.47 kg/m?  ? ?Physical Exam: ? ?Well appearing NAD ?HEENT: Unremarkable ?Neck:  No JVD, no thyromegally ?Lymphatics:  No adenopathy ?Back:  No CVA tenderness ?Lungs:  Clear with no wheezes ?HEART:  Regular rate rhythm, no murmurs, no rubs, no clicks ?Abd:  soft, positive bowel sounds, no organomegally, no rebound, no guarding ?Ext:  2 plus pulses, no edema, no cyanosis, no clubbing ?Skin:  No rashes no nodules ?Neuro:  CN II through XII intact, motor grossly intact ? ?EKG - nsr ? ? ?Assess/Plan:  ?1. PAF - he is mostly maintaining nsr. ?2. Coags - he has increased his dose of eliquis. I asked him to take it twice daily. ?3. orthostasis - I encouraged him to eat more salty foods. ?4. Stroke - he has not had any recurrent symptoms. He is working without limit. ?  ?Carleene Overlie Leoncio Hansen,MD ?

## 2021-06-15 NOTE — Patient Instructions (Signed)

## 2021-06-25 IMAGING — US US ABDOMEN LIMITED
1 series · 14 of 25 positions shown · non-contrast
Comparison: 08/31/2018

CLINICAL DATA: Cirrhosis, hepatocellular carcinoma and portal
venous hypertension screening.

EXAM:
ULTRASOUND ABDOMEN LIMITED RIGHT UPPER QUADRANT

[Series 1: us abdomen limited · 0.22mm/px · 14 of 43 slices shown]
[im 1/43]
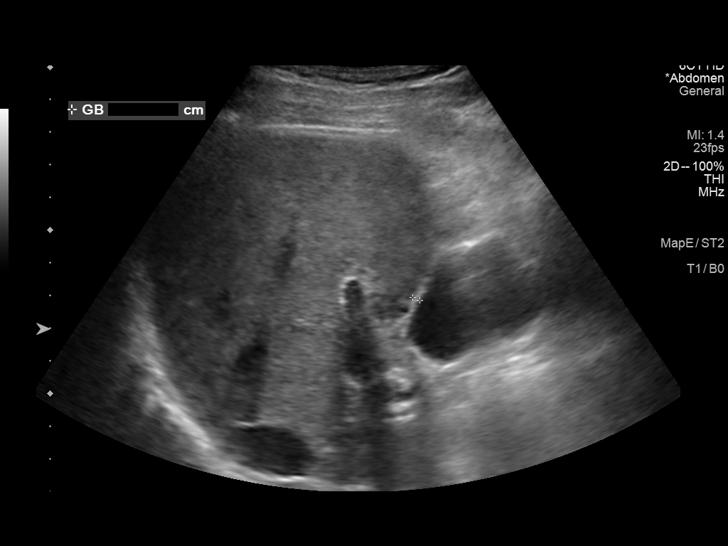
[im 4/43]
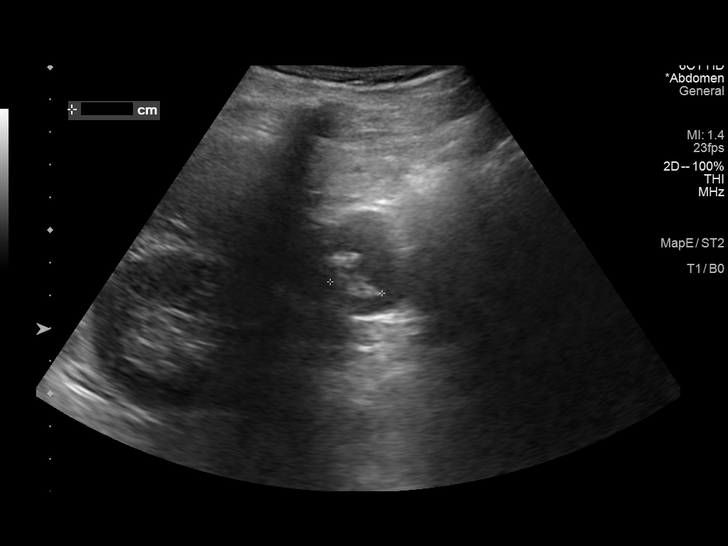
[im 8/43]
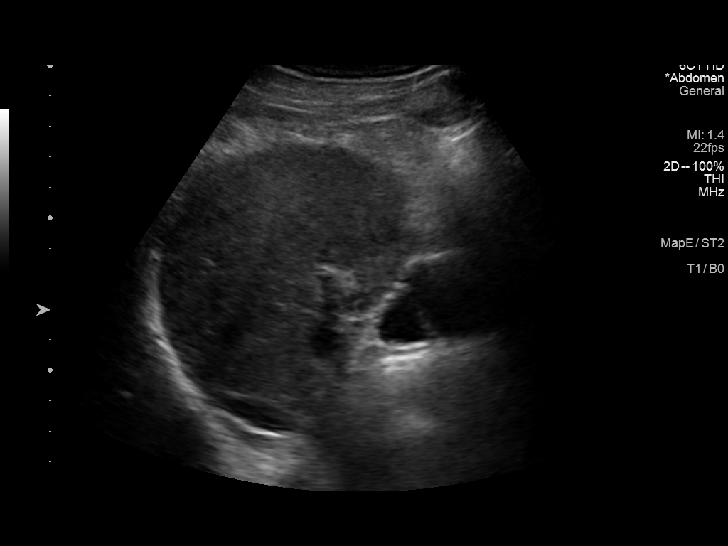
[im 11/43]
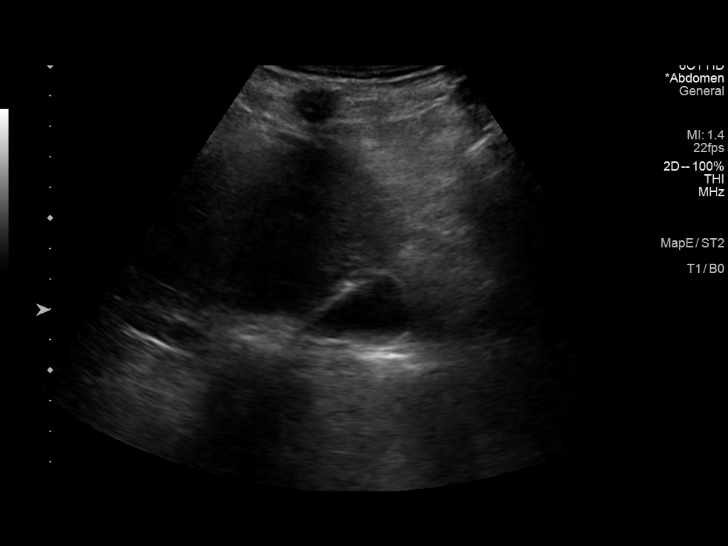
[im 15/43]
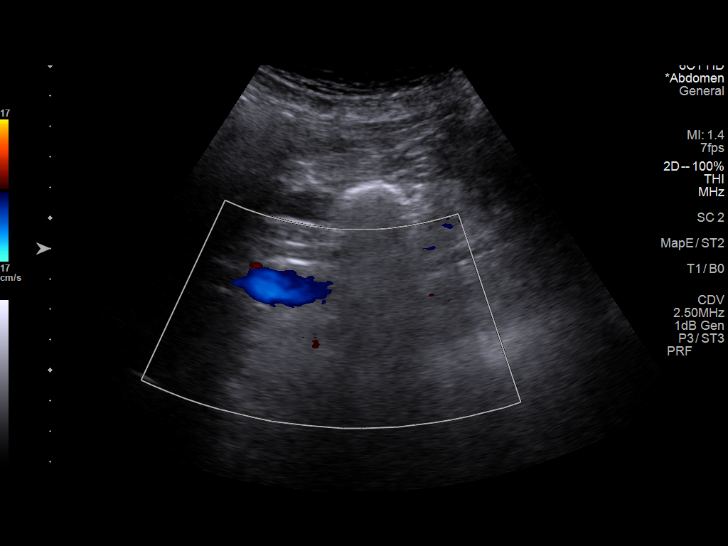
[im 16/43]
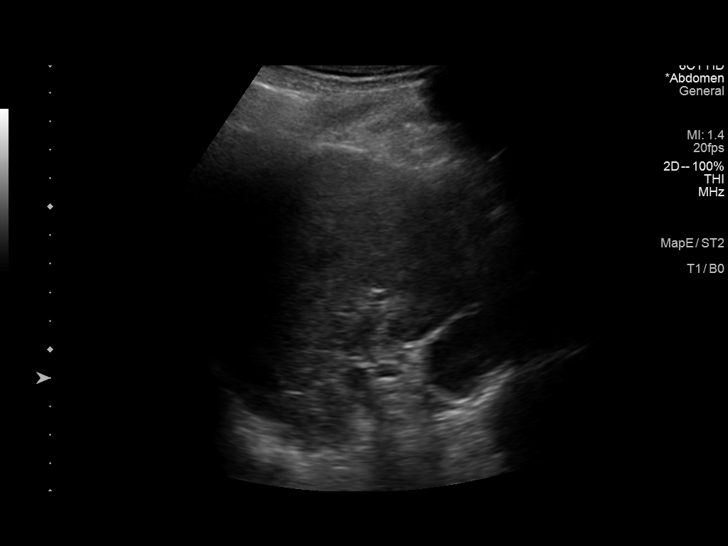
[im 20/43]
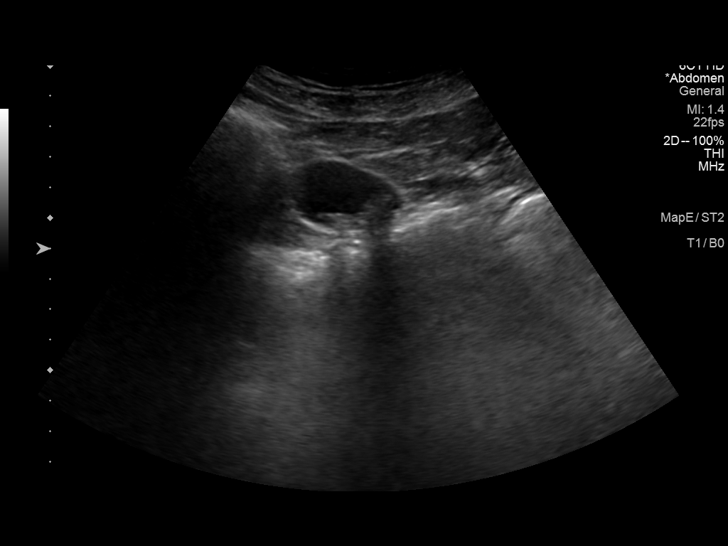
[im 23/43]
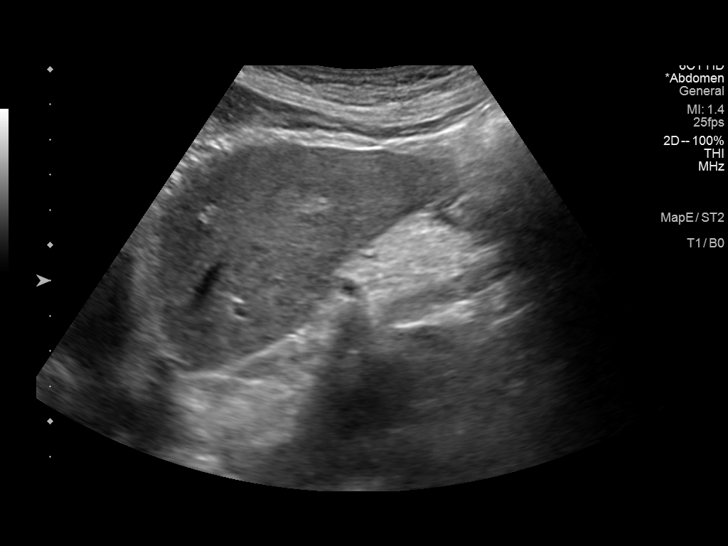
[im 27/43]
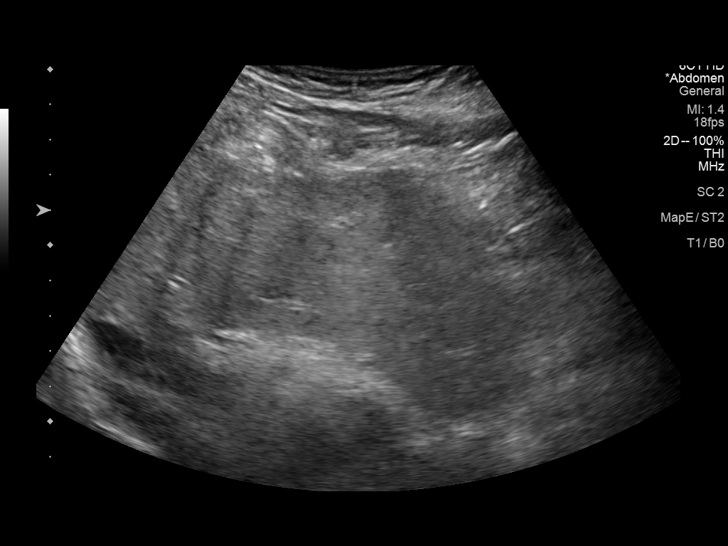
[im 29/43]
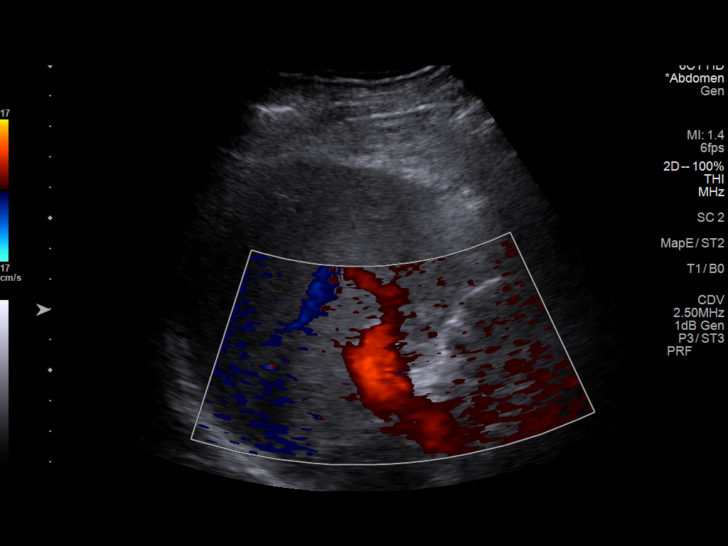
[im 32/43]
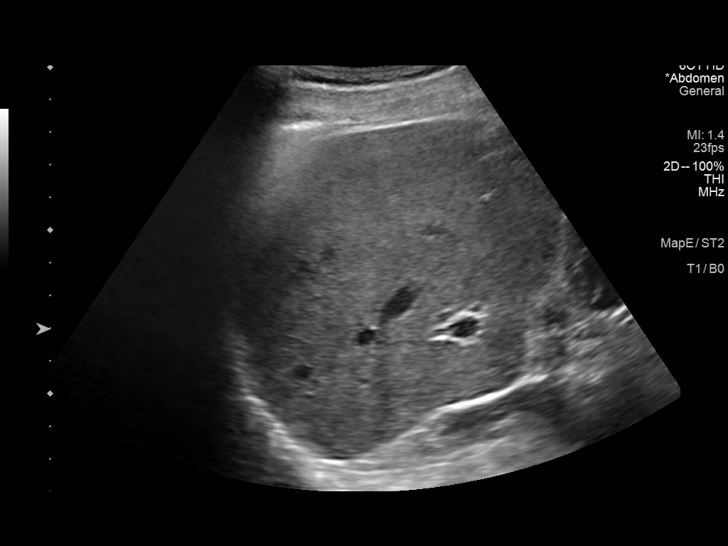
[im 36/43]
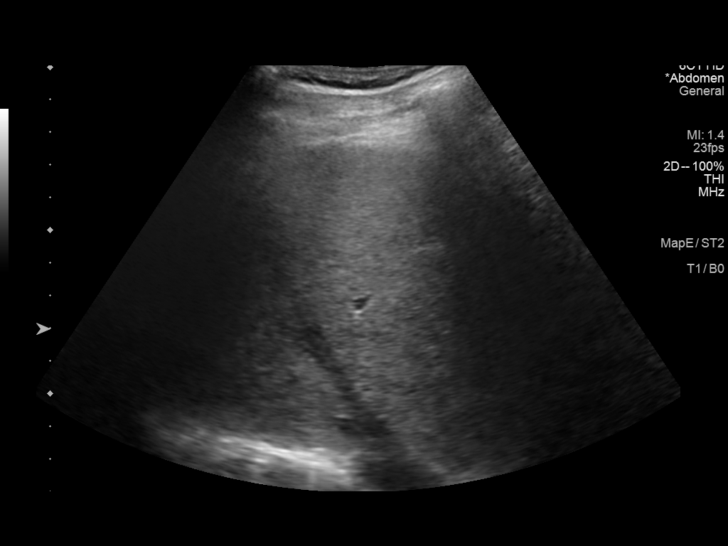
[im 39/43]
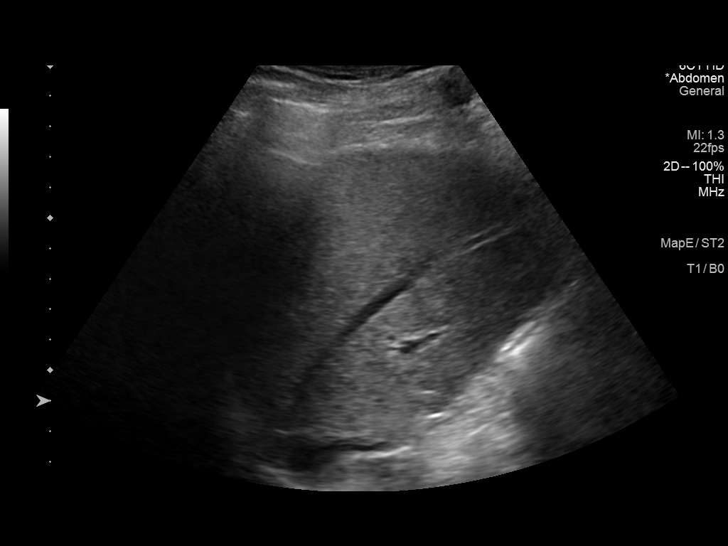
[im 43/43]
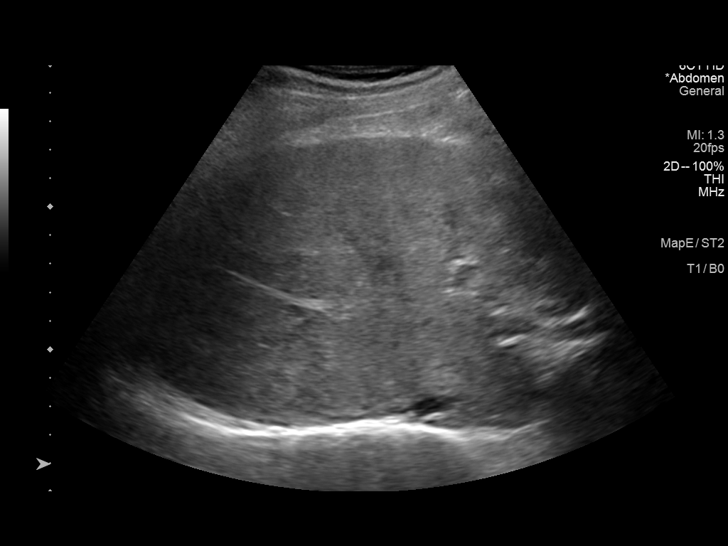

[14 of 25 positions shown; findings below may reference images not displayed]

FINDINGS: Gallbladder:

Moderate cholelithiasis with largest stone measuring up to 1.6 cm.
No significant gallbladder wall thickening. No adjacent free fluid.

Common bile duct:

Diameter: 4.3 mm.

Liver:

Nodular contour to the liver with coarse increased parenchymal
echogenicity compatible with known cirrhosis. No focal mass. Portal
vein is patent on color Doppler imaging with normal direction of
blood flow towards the liver.

Other: None.
IMPRESSION: 1.  Moderate cholelithiasis without evidence of acute cholecystitis.

2. Evidence of patient's known cirrhosis without evidence of focal
mass or portal hypertension.

## 2021-06-28 ENCOUNTER — Other Ambulatory Visit: Payer: Self-pay | Admitting: Family Medicine

## 2021-07-09 ENCOUNTER — Other Ambulatory Visit: Payer: Self-pay | Admitting: Family Medicine

## 2021-07-15 ENCOUNTER — Other Ambulatory Visit: Payer: Self-pay | Admitting: Family Medicine

## 2021-08-02 ENCOUNTER — Ambulatory Visit: Payer: Medicare Other | Admitting: Family Medicine

## 2021-08-10 DIAGNOSIS — R3915 Urgency of urination: Secondary | ICD-10-CM | POA: Diagnosis not present

## 2021-08-30 ENCOUNTER — Ambulatory Visit (INDEPENDENT_AMBULATORY_CARE_PROVIDER_SITE_OTHER): Payer: Medicare Other | Admitting: Family Medicine

## 2021-08-30 ENCOUNTER — Encounter: Payer: Self-pay | Admitting: Family Medicine

## 2021-08-30 VITALS — BP 117/66 | HR 69 | Temp 98.0°F | Ht 71.0 in | Wt 145.0 lb

## 2021-08-30 DIAGNOSIS — M549 Dorsalgia, unspecified: Secondary | ICD-10-CM | POA: Diagnosis not present

## 2021-08-30 DIAGNOSIS — K219 Gastro-esophageal reflux disease without esophagitis: Secondary | ICD-10-CM

## 2021-08-30 DIAGNOSIS — N400 Enlarged prostate without lower urinary tract symptoms: Secondary | ICD-10-CM

## 2021-08-30 DIAGNOSIS — I10 Essential (primary) hypertension: Secondary | ICD-10-CM | POA: Diagnosis not present

## 2021-08-30 DIAGNOSIS — E782 Mixed hyperlipidemia: Secondary | ICD-10-CM

## 2021-08-30 DIAGNOSIS — R7303 Prediabetes: Secondary | ICD-10-CM | POA: Diagnosis not present

## 2021-08-30 MED ORDER — PRAVASTATIN SODIUM 20 MG PO TABS: 20.0000 mg | ORAL_TABLET | Freq: Every day | ORAL | 3 refills | Status: DC

## 2021-08-30 MED ORDER — METHYLPREDNISOLONE ACETATE 80 MG/ML IJ SUSP: 80.0000 mg | Freq: Once | INTRAMUSCULAR | Status: DC

## 2021-08-30 MED ORDER — GABAPENTIN 300 MG PO CAPS: 300.0000 mg | ORAL_CAPSULE | Freq: Three times a day (TID) | ORAL | 3 refills | Status: DC

## 2021-08-30 MED ORDER — OMEPRAZOLE 20 MG PO CPDR: 20.0000 mg | DELAYED_RELEASE_CAPSULE | Freq: Every day | ORAL | 3 refills | Status: DC

## 2021-08-30 MED ORDER — METHYLPREDNISOLONE ACETATE 40 MG/ML IJ SUSP
40.0000 mg | Freq: Once | INTRAMUSCULAR | Status: AC
  Administered 2021-08-30: 80 mg via INTRAMUSCULAR

## 2021-08-30 MED ORDER — TAMSULOSIN HCL 0.4 MG PO CAPS: 0.4000 mg | ORAL_CAPSULE | Freq: Every day | ORAL | 3 refills | Status: DC

## 2021-08-30 NOTE — Addendum Note (Signed)
Addended by: Caryl Pina on: 08/30/2021 04:56 PM   Modules accepted: Orders

## 2021-08-30 NOTE — Addendum Note (Signed)
Addended by: Alphonzo Dublin on: 08/30/2021 05:12 PM   Modules accepted: Orders

## 2021-08-30 NOTE — Progress Notes (Signed)
BP 117/66   Pulse 69   Temp 98 F (36.7 C)   Ht 5\' 11"  (1.803 m)   Wt 145 lb (65.8 kg)   SpO2 97%   BMI 20.22 kg/m    Subjective:   Patient ID: Cameron Roman, male    DOB: 05-14-1950, 71 y.o.   MRN: [id number]  HPI: Cameron Roman is a 71 y.o. male presenting on 08/30/2021 for Medical Management of Chronic Issues, Hyperlipidemia, Hypertension, and Prediabetes   HPI Hypertension Patient is currently on no medication currently, monitoring for now., and their blood pressure today is 117/66 with. Patient denies any lightheadedness or dizziness. Patient denies headaches, blurred vision, chest pains, shortness of breath, or weakness. Denies any side effects from medication and is content with current medication.   Prediabetes  patient comes in today for recheck of his diabetes. Patient has been currently taking no medication currently, trying diet control. Patient is not currently on an ACE inhibitor/ARB. Patient has not seen an ophthalmologist this year. Patient denies any issues with their feet. The symptom started onset as an adult hypertension and hyperlipidemia ARE RELATED TO DM   Hyperlipidemia Patient is coming in for recheck of his hyperlipidemia. The patient is currently taking pravastatin. They deny any issues with myalgias or history of liver damage from it. They deny any focal numbness or weakness or chest pain.   Relevant past medical, surgical, family and social history reviewed and updated as indicated. Interim medical history since our last visit reviewed. Allergies and medications reviewed and updated.  Review of Systems  Constitutional:  Negative for chills and fever.  Eyes:  Negative for visual disturbance.  Respiratory:  Negative for shortness of breath and wheezing.   Cardiovascular:  Negative for chest pain and leg swelling.  Musculoskeletal:  Positive for back pain. Negative for gait problem.  Skin:  Negative for rash.  Neurological:  Negative for dizziness,  weakness and light-headedness.  All other systems reviewed and are negative.   Per HPI unless specifically indicated above   Allergies as of 08/30/2021       Reactions   Percocet [oxycodone-acetaminophen] Itching   Acetaminophen    Oxycodone Hcl         Medication List        Accurate as of August 30, 2021  4:40 PM. If you have any questions, ask your nurse or doctor.          Eliquis 5 MG Tabs tablet Generic drug: apixaban TAKE ONE TABLET BY MOUTH TWICE DAILY   Fish Oil 1000 MG Caps Take 1,000 mg by mouth in the morning and at bedtime.   fluticasone 50 MCG/ACT nasal spray Commonly known as: FLONASE Place 2 sprays into both nostrils daily.   gabapentin 300 MG capsule Commonly known as: NEURONTIN Take 1 capsule (300 mg total) by mouth 3 (three) times daily. Take one capsule twice to three times daily What changed: See the new instructions. Changed by: Worthy Rancher, MD   HYDROcodone-acetaminophen 10-325 MG tablet Commonly known as: NORCO Take 1 tablet by mouth 3 (three) times daily.   methocarbamol 500 MG tablet Commonly known as: ROBAXIN Take 1 tablet (500 mg total) by mouth every 6 (six) hours as needed for muscle spasms.   omeprazole 20 MG capsule Commonly known as: PRILOSEC Take 1 capsule (20 mg total) by mouth daily.   pravastatin 20 MG tablet Commonly known as: PRAVACHOL Take 1 tablet (20 mg total) by mouth daily. Take one tablet once daily  What changed: See the new instructions. Changed by: [name] Pearley Millington, MD   tamsulosin 0.4 MG Caps capsule Commonly known as: FLOMAX Take 1 capsule (0.4 mg total) by mouth daily.         Objective:   BP 117/66   Pulse 69   Temp 98 F (36.7 C)   Ht 5\' 11"  (1.803 m)   Wt 145 lb (65.8 kg)   SpO2 97%   BMI 20.22 kg/m   Wt Readings from Last 3 Encounters:  08/30/21 145 lb (65.8 kg)  06/15/21 146 lb 12.8 oz (66.6 kg)  02/01/21 156 lb (70.8 kg)    Physical Exam Vitals and nursing note  reviewed.  Constitutional:      General: He is not in acute distress.    Appearance: He is well-developed. He is not diaphoretic.  Eyes:     General: No scleral icterus.    Conjunctiva/sclera: Conjunctivae normal.  Neck:     Thyroid: No thyromegaly.  Cardiovascular:     Rate and Rhythm: Normal rate and regular rhythm.     Heart sounds: Normal heart sounds. No murmur heard. Pulmonary:     Effort: Pulmonary effort is normal. No respiratory distress.     Breath sounds: Normal breath sounds. No wheezing.  Musculoskeletal:        General: No swelling. Normal range of motion.     Cervical back: Neck supple.     Thoracic back: Tenderness and bony tenderness present. Normal range of motion.       Back:  Lymphadenopathy:     Cervical: No cervical adenopathy.  Skin:    General: Skin is warm and dry.     Findings: No rash.  Neurological:     Mental Status: He is alert and oriented to person, place, and time.     Coordination: Coordination normal.  Psychiatric:        Behavior: Behavior normal.       Assessment & Plan:   Problem List Items Addressed This Visit       Cardiovascular and Mediastinum   HTN (hypertension) - Primary   Relevant Medications   pravastatin (PRAVACHOL) 20 MG tablet     Digestive   GERD   Relevant Medications   omeprazole (PRILOSEC) 20 MG capsule     Genitourinary   BPH (benign prostatic hyperplasia)   Relevant Medications   tamsulosin (FLOMAX) 0.4 MG CAPS capsule     Other   Prediabetes   Relevant Medications   gabapentin (NEURONTIN) 300 MG capsule   HLD (hyperlipidemia)   Relevant Medications   pravastatin (PRAVACHOL) 20 MG tablet   Other Visit Diagnoses     Mid back pain       Relevant Medications   methylPREDNISolone acetate (DEPO-MEDROL) injection 80 mg (Start on 08/30/2021  4:45 PM)       Continue current medicine, will do refills.  We will do blood work today  Patient has been having mid back pain, he does have chronic issues,  will do an injection to see back, down but he does need to go back to his pain management Follow up plan: Return in about 6 months (around 03/01/2022), or if symptoms worsen or fail to improve, for Prediabetes hypertension .  Counseling provided for all of the vaccine components No orders of the defined types were placed in this encounter.   Caryl Pina, MD [facility] 08/30/2021, 4:40 PM

## 2021-08-31 LAB — CMP14+EGFR
ALT: 6 IU/L (ref 0–44)
AST: 11 IU/L (ref 0–40)
Albumin/Globulin Ratio: 1.5 (ref 1.2–2.2)
Albumin: 4.2 g/dL (ref 3.8–4.8)
Alkaline Phosphatase: 93 IU/L (ref 44–121)
BUN/Creatinine Ratio: 18 (ref 10–24)
BUN: 17 mg/dL (ref 8–27)
Bilirubin Total: <0.2 mg/dL (ref 0.0–1.2)
CO2: 22 mmol/L (ref 20–29)
Calcium: 9.1 mg/dL (ref 8.6–10.2)
Chloride: 106 mmol/L (ref 96–106)
Creatinine, Ser: 0.93 mg/dL (ref 0.76–1.27)
Globulin, Total: 2.8 g/dL (ref 1.5–4.5)
Glucose: 99 mg/dL (ref 70–99)
Potassium: 4.4 mmol/L (ref 3.5–5.2)
Sodium: 140 mmol/L (ref 134–144)
Total Protein: 7 g/dL (ref 6.0–8.5)
eGFR: 89 mL/min/{1.73_m2} (ref >59)

## 2021-08-31 LAB — LIPID PANEL
Chol/HDL Ratio: 3.1 ratio (ref 0.0–5.0)
Cholesterol, Total: 108 mg/dL (ref 100–199)
HDL: 35 mg/dL — ABNORMAL LOW (ref >39)
LDL Chol Calc (NIH): 44 mg/dL (ref 0–99)
Triglycerides: 174 mg/dL — ABNORMAL HIGH (ref 0–149)
VLDL Cholesterol Cal: 29 mg/dL (ref 5–40)

## 2021-08-31 LAB — CBC WITH DIFFERENTIAL/PLATELET
Basophils Absolute: 0.1 10*3/uL (ref 0.0–0.2)
Basos: 1 %
EOS (ABSOLUTE): 0.2 10*3/uL (ref 0.0–0.4)
Eos: 2 %
Hematocrit: 32.5 % — ABNORMAL LOW (ref 37.5–51.0)
Hemoglobin: 11.1 g/dL — ABNORMAL LOW (ref 13.0–17.7)
Immature Grans (Abs): 0 10*3/uL (ref 0.0–0.1)
Immature Granulocytes: 0 %
Lymphocytes Absolute: 5.3 10*3/uL — ABNORMAL HIGH (ref 0.7–3.1)
Lymphs: 46 %
MCH: 31.3 pg (ref 26.6–33.0)
MCHC: 34.2 g/dL (ref 31.5–35.7)
MCV: 92 fL (ref 79–97)
Monocytes Absolute: 1 10*3/uL — ABNORMAL HIGH (ref 0.1–0.9)
Monocytes: 8 %
Neutrophils Absolute: 4.9 10*3/uL (ref 1.4–7.0)
Neutrophils: 43 %
Platelets: 361 10*3/uL (ref 150–450)
RBC: 3.55 x10E6/uL — ABNORMAL LOW (ref 4.14–5.80)
RDW: 13.8 % (ref 11.6–15.4)
WBC: 11.4 10*3/uL — ABNORMAL HIGH (ref 3.4–10.8)

## 2021-08-31 LAB — BAYER DCA HB A1C WAIVED: HB A1C (BAYER DCA - WAIVED): 5.7 % — ABNORMAL HIGH (ref 4.8–5.6)

## 2021-09-02 DIAGNOSIS — R0602 Shortness of breath: Secondary | ICD-10-CM | POA: Diagnosis not present

## 2021-09-02 DIAGNOSIS — M47814 Spondylosis without myelopathy or radiculopathy, thoracic region: Secondary | ICD-10-CM | POA: Diagnosis not present

## 2021-09-02 DIAGNOSIS — J439 Emphysema, unspecified: Secondary | ICD-10-CM | POA: Diagnosis not present

## 2021-09-02 DIAGNOSIS — G894 Chronic pain syndrome: Secondary | ICD-10-CM | POA: Diagnosis not present

## 2021-09-02 DIAGNOSIS — M5030 Other cervical disc degeneration, unspecified cervical region: Secondary | ICD-10-CM | POA: Diagnosis not present

## 2021-09-02 DIAGNOSIS — R918 Other nonspecific abnormal finding of lung field: Secondary | ICD-10-CM | POA: Diagnosis not present

## 2021-09-02 DIAGNOSIS — M546 Pain in thoracic spine: Secondary | ICD-10-CM | POA: Diagnosis not present

## 2021-09-02 DIAGNOSIS — M5136 Other intervertebral disc degeneration, lumbar region: Secondary | ICD-10-CM | POA: Diagnosis not present

## 2021-09-02 DIAGNOSIS — J984 Other disorders of lung: Secondary | ICD-10-CM | POA: Diagnosis not present

## 2021-09-06 DIAGNOSIS — C3432 Malignant neoplasm of lower lobe, left bronchus or lung: Secondary | ICD-10-CM | POA: Diagnosis not present

## 2021-09-06 DIAGNOSIS — I2510 Atherosclerotic heart disease of native coronary artery without angina pectoris: Secondary | ICD-10-CM | POA: Diagnosis not present

## 2021-09-06 DIAGNOSIS — J439 Emphysema, unspecified: Secondary | ICD-10-CM | POA: Diagnosis not present

## 2021-09-17 DIAGNOSIS — G894 Chronic pain syndrome: Secondary | ICD-10-CM | POA: Diagnosis not present

## 2021-09-21 DIAGNOSIS — G894 Chronic pain syndrome: Secondary | ICD-10-CM | POA: Diagnosis not present

## 2021-09-21 DIAGNOSIS — R918 Other nonspecific abnormal finding of lung field: Secondary | ICD-10-CM | POA: Diagnosis not present

## 2021-10-01 ENCOUNTER — Other Ambulatory Visit: Payer: Self-pay | Admitting: Nurse Practitioner

## 2021-10-01 DIAGNOSIS — K760 Fatty (change of) liver, not elsewhere classified: Secondary | ICD-10-CM

## 2021-10-01 DIAGNOSIS — K7469 Other cirrhosis of liver: Secondary | ICD-10-CM

## 2021-10-01 DIAGNOSIS — R911 Solitary pulmonary nodule: Secondary | ICD-10-CM | POA: Diagnosis not present

## 2021-10-06 ENCOUNTER — Ambulatory Visit
Admission: RE | Admit: 2021-10-06 | Discharge: 2021-10-06 | Disposition: A | Discharge status: Home / Self Care | Payer: Medicare Other | Source: Ambulatory Visit | Attending: Nurse Practitioner | Admitting: Nurse Practitioner

## 2021-10-06 DIAGNOSIS — K760 Fatty (change of) liver, not elsewhere classified: Secondary | ICD-10-CM

## 2021-10-06 DIAGNOSIS — K7460 Unspecified cirrhosis of liver: Secondary | ICD-10-CM | POA: Diagnosis not present

## 2021-10-06 DIAGNOSIS — K8020 Calculus of gallbladder without cholecystitis without obstruction: Secondary | ICD-10-CM | POA: Diagnosis not present

## 2021-10-06 DIAGNOSIS — K7469 Other cirrhosis of liver: Secondary | ICD-10-CM

## 2021-10-21 ENCOUNTER — Ambulatory Visit: Payer: Medicare Other | Admitting: Pulmonary Disease

## 2021-10-21 ENCOUNTER — Encounter: Payer: Self-pay | Admitting: Pulmonary Disease

## 2021-10-21 VITALS — BP 118/66 | HR 86 | Temp 98.4°F | Ht 70.0 in | Wt 140.4 lb

## 2021-10-21 DIAGNOSIS — R918 Other nonspecific abnormal finding of lung field: Secondary | ICD-10-CM | POA: Diagnosis not present

## 2021-10-21 NOTE — Patient Instructions (Addendum)
Nice to meet you today  We will repeat a CT scan within the next week to get a better picture of the spot in the lung  We will send a date for procedure the next couple weeks to do a biopsy.  You met with Dr. Valeta Harms  today who does the procedure.  Take last dose of Eliquis on Saturday night, August 26. No doses Sunday or Monday or Tuesday. Plan for procedure Tuesday August 29.

## 2021-10-21 NOTE — Progress Notes (Signed)
PCCM:  Orders placed for bronchoscopy on 11/09/2021.  Last dose Eliquis on 11/06/2021, Saturday night. Orders placed for super D imaging to be complete prior to bronchoscopy.  We appreciate the referral from Dr. Silas Flood.  Garner Nash, DO [facility] 10/21/2021 9:54 AM

## 2021-10-21 NOTE — Progress Notes (Signed)
_0  ID: Cameron Roman, male    DOB: 05/01/1950, 71 y.o.   MRN: [id number]  Chief Complaint  Patient presents with   Consult    Pt is here for a consult for a spot on his left lung. Pt states he has no issues noted with his breathing. Pt did have CT scan in June and a chest xray as well through [facility]. No inhalers noted from patient     Referring provider: Roosevelt Locks, CRNP  HPI:   71 y.o. man whom we are seeing in consultation for evaluation of lung mass.  Most recent PCP note reviewed.  Heme-onc note 7/23 from [facility] system reviewed.  Unclear reason why a chest x-ray is obtained 09/02/2021.  Recent shortness of breath.  He denies any respiratory symptoms at that time.  Denies any respiratory symptoms ever.  No dyspnea etc.  No cough etc.  Regardless chest x-ray is obtained.  Per report that showed left-sided spiculated nodule versus mass.  This prompted CT scan 4 days later with contrast of the chest.  On my review of results this reports up to 5 cm spiculated left lower lobe near the superior segment mass with extension towards the hilum.  No lymphadenopathy was noted.  This prompted referral to the [facility] system.  He met with oncology 09/2021.  Recommended biopsy referral to pulmonary as well as PET scan.  PET scan has not been performed.  He has not followed up.  He is just now seeing [place] for consideration of biopsy.  He shares his wife died from lung cancer 3 years ago.  Stage IV small cell.  He was everywhere.  At time of diagnosis.  He understands lung cancer.  We discussed at length the role and rationale for biopsy.  He expressed understanding is amenable to this.  Case discussed with Dr. Valeta Harms today.  We established a timeframe for repeat CT scan as well as biopsy in the coming week or 2.  Being scheduled today for later in August.  PMH: Tobacco abuse in remission, atrial fibrillation on Eliquis, cirrhosis Surgical history: Cervical spine surgery, appendectomy, hand surgery, Family  history: Father with colon cancer, mother with perforated bowel Social history: Former smoker, 41-pack-year, quit 2002    Questionaires / Pulmonary Flowsheets:   ACT:      No data to display          MMRC:     No data to display          Epworth:      No data to display          Tests:   FENO:  No results found for: "NITRICOXIDE"  PFT:     No data to display          WALK:      No data to display          Imaging: Personally reviewed US Abdomen Limited RUQ (LIVER/GB)  Result Date: 10/06/2021 CLINICAL DATA:  Cirrhosis.  Steatosis of the liver. EXAM: ULTRASOUND ABDOMEN LIMITED RIGHT UPPER QUADRANT COMPARISON:  February 09, 2021 FINDINGS: Gallbladder: Cholelithiasis in an otherwise normal appearing gallbladder. Common bile duct: Diameter: 4.1 mm Liver: Heterogeneous increased echotexture. No focal mass. Portal vein is patent on color Doppler imaging with normal direction of blood flow towards the liver. Other: None. IMPRESSION: 1. Cholelithiasis in an otherwise normal appearing gallbladder. 2. Increased heterogeneous echotexture throughout the liver consistent with reported cirrhosis. No liver mass identified. No ascites. Electronically Signed   By:  Dorise Bullion III M.D.   On: 10/06/2021 16:49    Lab Results: Personally reviewed CBC    Component Value Date/Time   WBC 11.4 (H) 08/30/2021 1659   WBC 8.8 09/25/2019 0940   RBC 3.55 (L) 08/30/2021 1659   RBC 4.06 (L) 09/25/2019 0940   HGB 11.1 (L) 08/30/2021 1659   HCT 32.5 (L) 08/30/2021 1659   PLT 361 08/30/2021 1659   MCV 92 08/30/2021 1659   MCH 31.3 08/30/2021 1659   MCH 32.8 09/25/2019 0940   MCHC 34.2 08/30/2021 1659   MCHC 33.9 09/25/2019 0940   RDW 13.8 08/30/2021 1659   LYMPHSABS 5.3 (H) 08/30/2021 1659   MONOABS 1.3 (H) 09/11/2015 1528   EOSABS 0.2 08/30/2021 1659   BASOSABS 0.1 08/30/2021 1659    BMET    Component Value Date/Time   NA 140 08/30/2021 1659   K 4.4 08/30/2021  1659   CL 106 08/30/2021 1659   CO2 22 08/30/2021 1659   GLUCOSE 99 08/30/2021 1659   GLUCOSE 137 (H) 09/25/2019 0940   BUN 17 08/30/2021 1659   CREATININE 0.93 08/30/2021 1659   CREATININE 0.80 08/30/2012 0955   CALCIUM 9.1 08/30/2021 1659   GFRNONAA 92 11/21/2019 0847   GFRNONAA >89 08/30/2012 0955   GFRAA 106 11/21/2019 0847   GFRAA >89 08/30/2012 0955    BNP No results found for: "BNP"  ProBNP No results found for: "PROBNP"  Specialty Problems       Pulmonary Problems   HIATAL HERNIA    Qualifier: Diagnosis of  By: Laney Potash, Pam         Allergies  Allergen Reactions   Percocet [Oxycodone-Acetaminophen] Itching   Acetaminophen    Oxycodone Hcl     Immunization History  Administered Date(s) Administered   Fluad Quad(high Dose 65+) 01/18/2019, 02/01/2021   Influenza, High Dose Seasonal PF 01/10/2018   Influenza,inj,Quad PF,6+ Mos 12/11/2012, 01/21/2014, 02/17/2015, 12/23/2015, 01/06/2017   Moderna Sars-Covid-2 Vaccination 07/12/2019, 08/15/2019   Zoster, Live 03/12/2013    Past Medical History:  Diagnosis Date   Arthritis    Chronic back pain    buldging disc   Chronic neck pain    ruptured disc   Cough    hx smoking   Diverticulosis    Esophageal stricture    GERD (gastroesophageal reflux disease)    takes Prilosec daily   H/O hiatal hernia    Hearing loss    Hepatitis-C 2017   Hiatal hernia    History of colon polyps    Insomnia    takes Elavil nightly   Lipoma of abdominal wall 02/11/2011   Stroke ([place])    Type 2 diabetes mellitus ([place]) 07/22/2020    Tobacco History: Social History   Tobacco Use  Smoking Status Former   Packs/day: 1.00   Years: 41.00   Total pack years: 41.00   Types: Cigarettes   Quit date: 06/29/2002   Years since quitting: 19.3  Smokeless Tobacco Never  Tobacco Comments   Pt quit smoking in 2004. 10/21/21   Counseling given: Not Answered Tobacco comments: Pt quit smoking in 2004. 10/21/21   Continue  to not smoke  Outpatient Encounter Medications as of 10/21/2021  Medication Sig   ELIQUIS 5 MG TABS tablet TAKE ONE TABLET BY MOUTH TWICE DAILY   fluticasone (FLONASE) 50 MCG/ACT nasal spray Place 2 sprays into both nostrils daily.   fluticasone (FLONASE) 50 MCG/ACT nasal spray Place into the nose.   gabapentin (NEURONTIN) 300 MG capsule  Take 1 capsule (300 mg total) by mouth 3 (three) times daily. Take one capsule twice to three times daily   HYDROcodone-acetaminophen (NORCO) 10-325 MG tablet Take 1 tablet by mouth 3 (three) times daily.   methocarbamol (ROBAXIN) 500 MG tablet Take 1 tablet (500 mg total) by mouth every 6 (six) hours as needed for muscle spasms.   Omega-3 Fatty Acids (FISH OIL) 1000 MG CAPS Take 1,000 mg by mouth in the morning and at bedtime.    omeprazole (PRILOSEC) 20 MG capsule Take 1 capsule (20 mg total) by mouth daily.   pravastatin (PRAVACHOL) 20 MG tablet Take 1 tablet (20 mg total) by mouth daily. Take one tablet once daily   tamsulosin (FLOMAX) 0.4 MG CAPS capsule Take 1 capsule (0.4 mg total) by mouth daily.   Facility-Administered Encounter Medications as of 10/21/2021  Medication   methylPREDNISolone acetate (DEPO-MEDROL) injection 80 mg     Review of Systems  Review of Systems  No chest pain with exertion.  No orthopnea or PND.  Comprehensive review of systems otherwise negative. Physical Exam  BP 118/66 (BP Location: Left Arm, Patient Position: Sitting, Cuff Size: Normal)   Pulse 86   Temp 98.4 F (36.9 C) (Oral)   Ht _0  (1.778 m)   Wt 140 lb 6.4 oz (63.7 kg)   SpO2 96%   BMI 20.15 kg/m   Wt Readings from Last 5 Encounters:  10/21/21 140 lb 6.4 oz (63.7 kg)  08/30/21 145 lb (65.8 kg)  06/15/21 146 lb 12.8 oz (66.6 kg)  02/01/21 156 lb (70.8 kg)  11/19/20 154 lb (69.9 kg)    BMI Readings from Last 5 Encounters:  10/21/21 20.15 kg/m  08/30/21 20.22 kg/m  06/15/21 20.47 kg/m  02/01/21 22.38 kg/m  11/19/20 22.10 kg/m      Physical Exam General: Well-appearing, no acute distress Eyes: EOMI, no icterus Neck: Supple, no JVP Pulmonary: Clear, normal work of breathing, distant Cardiovascular: Warm, no edema Abdomen: Nondistended, bowel sounds present MSK: No synovitis, no joint effusion Neuro: Normal gait, no weakness Psych: Normal mood, full affect   Assessment & Plan:   Lung mass: 5 cm reportedly left lower lobe superior segment with possible extension up towards the hilum.  I cannot view images.  Requesting these.  High suspicion for lung cancer.  This was shared with him today.  He expressed understanding.  His wife passed away from lung cancer.  Repeat CT chest super D in the short-term.  Arranging navigational bronchoscopy with Dr. Valeta Harms in the next couple weeks.   Return if symptoms worsen or fail to improve.   Lanier Clam, MD 10/21/2021

## 2021-10-22 DIAGNOSIS — G894 Chronic pain syndrome: Secondary | ICD-10-CM | POA: Diagnosis not present

## 2021-10-25 ENCOUNTER — Encounter: Payer: Self-pay | Admitting: Pulmonary Disease

## 2021-10-25 ENCOUNTER — Telehealth: Payer: Self-pay | Admitting: Pulmonary Disease

## 2021-10-25 NOTE — Telephone Encounter (Signed)
I have him rescheduled to 9/5 at 11:30.  He will arrive at 8:00 so he can get covid test prior.  Spoke to pt & gave him appt info.  Will send him a letter as well.  Nothing further needed.

## 2021-10-25 NOTE — Telephone Encounter (Signed)
Spoke to pt to give him appt info for bronch scheduled on 8/29.  He states he is unable to go then.  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  He states employer will be back in town on 9/2 and he can go any day after that.  Dr Valeta Harms when would you like me to reschedule him?

## 2021-11-02 ENCOUNTER — Ambulatory Visit (HOSPITAL_COMMUNITY): Admission: RE | Admit: 2021-11-02 | Payer: Medicare Other | Source: Ambulatory Visit

## 2021-11-03 ENCOUNTER — Ambulatory Visit (HOSPITAL_COMMUNITY)
Admission: RE | Admit: 2021-11-03 | Discharge: 2021-11-03 | Disposition: A | Discharge status: Home / Self Care | Payer: Medicare Other | Source: Ambulatory Visit | Attending: Pulmonary Disease | Admitting: Pulmonary Disease

## 2021-11-03 DIAGNOSIS — R918 Other nonspecific abnormal finding of lung field: Secondary | ICD-10-CM | POA: Insufficient documentation

## 2021-11-03 DIAGNOSIS — I700 Atherosclerosis of aorta: Secondary | ICD-10-CM | POA: Diagnosis not present

## 2021-11-03 DIAGNOSIS — R911 Solitary pulmonary nodule: Secondary | ICD-10-CM | POA: Diagnosis not present

## 2021-11-03 DIAGNOSIS — J439 Emphysema, unspecified: Secondary | ICD-10-CM | POA: Diagnosis not present

## 2021-11-09 DIAGNOSIS — R918 Other nonspecific abnormal finding of lung field: Secondary | ICD-10-CM | POA: Insufficient documentation

## 2021-11-12 ENCOUNTER — Encounter (HOSPITAL_COMMUNITY): Payer: Self-pay | Admitting: Pulmonary Disease

## 2021-11-12 ENCOUNTER — Other Ambulatory Visit: Payer: Self-pay

## 2021-11-12 NOTE — Progress Notes (Signed)
SDW CALL  Patient was given pre-op instructions over the phone. The opportunity was given for the patient to ask questions. No further questions asked. Patient verbalized understanding of instructions given.   PCP - Dr. Warrick Parisian Cardiologist - EP loop recorder: Dr. Lovena Le  Chest x-ray - 2017.  CT Super D chest 11/03/2021 EKG - 06/15/2021 Stress Test - denies ECHO - 09/2015 Cardiac Cath - 2006  Sleep Study - denies CPAP -   Fasting Blood Sugar - Pt denies being a diabetic Checks Blood Sugar _____ times a day  Blood Thinner Instructions: Stop Eliquis 3 days prior Aspirin Instructions:  ERAS Protcol - clears until 800 PRE-SURGERY Ensure or G2-   COVID TEST- Yes-- DOS   Anesthesia review: no  Patient denies shortness of breath, fever, cough and chest pain over the phone call   All instructions explained to the patient, with a verbal understanding of the material. Patient agrees to go over the instructions while at home for a better understanding.

## 2021-11-16 ENCOUNTER — Ambulatory Visit (HOSPITAL_COMMUNITY): Admission: RE | Admit: 2021-11-16 | Payer: Medicare Other | Source: Home / Self Care | Admitting: Pulmonary Disease

## 2021-11-16 DIAGNOSIS — R918 Other nonspecific abnormal finding of lung field: Secondary | ICD-10-CM | POA: Insufficient documentation

## 2021-11-16 HISTORY — DX: Anxiety disorder, unspecified: F41.9

## 2021-11-16 HISTORY — DX: Unspecified atrial fibrillation: I48.91

## 2021-11-16 SURGERY — BRONCHOSCOPY, WITH BIOPSY USING ELECTROMAGNETIC NAVIGATION
Anesthesia: General

## 2021-11-16 NOTE — Progress Notes (Signed)
patient had days mixed up and does not have a ride or anyone to stay with him after his procedure today and will need to cancel.  Will let Dr. Valeta Harms know.

## 2021-11-19 ENCOUNTER — Encounter: Payer: Self-pay | Admitting: Pulmonary Disease

## 2021-11-22 ENCOUNTER — Ambulatory Visit (INDEPENDENT_AMBULATORY_CARE_PROVIDER_SITE_OTHER): Payer: Medicare Other | Admitting: *Deleted

## 2021-11-22 DIAGNOSIS — G894 Chronic pain syndrome: Secondary | ICD-10-CM | POA: Diagnosis not present

## 2021-11-22 DIAGNOSIS — Z0000 Encounter for general adult medical examination without abnormal findings: Secondary | ICD-10-CM | POA: Diagnosis not present

## 2021-11-22 NOTE — Progress Notes (Signed)
MEDICARE ANNUAL WELLNESS VISIT  11/22/2021  Telephone Visit Disclaimer This Medicare AWV was conducted by telephone due to national recommendations for restrictions regarding the COVID-19 Pandemic (e.g. social distancing).  I verified, using two identifiers, that I am speaking with [name] or their authorized healthcare agent. I discussed the limitations, risks, security, and privacy concerns of performing an evaluation and management service by telephone and the potential availability of an in-person appointment in the future. The patient expressed understanding and agreed to proceed.  Location of Patient: Home  Location of Provider (nurse):  [facility]  Subjective:    Cameron Roman is a 71 y.o. male patient of Dettinger, [name], MD who had a Medicare Annual Wellness Visit today via telephone. Jobin is Disabled and lives alone. he has 2 children. he reports that he is socially active and does interact with friends/family regularly. he is minimally physically active and enjoys working on the farm.  Patient Care Team: Dettinger, [name], MD as PCP - General (Family Medicine) Evans Lance, MD as PCP - Electrophysiology (Cardiology) Lavera Guise, [facility] as Pharmacist (Family Medicine)     11/22/2021    3:32 PM 11/19/2020    3:41 PM 09/25/2019    9:30 AM 05/23/2019    8:59 AM 09/19/2016    2:20 PM 09/14/2015    9:15 AM 09/11/2015    1:33 PM  Advanced Directives  Does Patient Have a Medical Advance Directive? No No No No No No No  Would patient like information on creating a medical advance directive? No - Patient declined No - Patient declined No - Patient declined No - Patient declined  No - patient declined information     Hospital Utilization Over the Past 12 Months: # of hospitalizations or ER visits: 0 # of surgeries: 0- Pt is scheduled on September 19th for a lung biopsy   Review of Systems    Patient reports that his overall health is better  compared to last year.    Patient Reported Readings (BP, Pulse, CBG, Weight, etc) none  Pain Assessment Pain : No/denies pain     Current Medications & Allergies (verified) Allergies as of 11/22/2021       Reactions   Percocet [oxycodone-acetaminophen] Itching        Medication List        Accurate as of November 22, 2021  3:40 PM. If you have any questions, ask your nurse or doctor.          Eliquis 5 MG Tabs tablet Generic drug: apixaban TAKE ONE TABLET BY MOUTH TWICE DAILY   Fish Oil 1000 MG Caps Take 1,000 mg by mouth in the morning and at bedtime.   fluticasone 50 MCG/ACT nasal spray Commonly known as: FLONASE Place 1 spray into the nose in the morning.   gabapentin 300 MG capsule Commonly known as: NEURONTIN Take 1 capsule (300 mg total) by mouth 3 (three) times daily. Take one capsule twice to three times daily What changed:  when to take this additional instructions   HYDROcodone-acetaminophen 10-325 MG tablet Commonly known as: NORCO Take 1 tablet by mouth 3 (three) times daily.   omeprazole 20 MG capsule Commonly known as: PRILOSEC Take 1 capsule (20 mg total) by mouth daily.   pravastatin 20 MG tablet Commonly known as: PRAVACHOL Take 1 tablet (20 mg total) by mouth daily. Take one tablet once daily What changed: when to take this   tamsulosin 0.4 MG Caps  capsule Commonly known as: FLOMAX Take 1 capsule (0.4 mg total) by mouth daily.        History (reviewed): Past Medical History:  Diagnosis Date   Anxiety    Arthritis    Atrial fibrillation (HCC)    Chronic back pain    buldging disc   Chronic neck pain    ruptured disc   Cough    hx smoking   Diverticulosis    Esophageal stricture    GERD (gastroesophageal reflux disease)    takes Prilosec daily   H/O hiatal hernia    Hearing loss    Hepatitis-C 2017   Hiatal hernia    History of colon polyps    Insomnia    takes Elavil nightly   Lipoma of abdominal wall  02/11/2011   Stroke ([place])    Type 2 diabetes mellitus ([place]) 07/22/2020   Past Surgical History:  Procedure Laterality Date   ANTERIOR CERVICAL DECOMP/DISCECTOMY FUSION  04/12/2011   Procedure: ANTERIOR CERVICAL DECOMPRESSION/DISCECTOMY FUSION 2 LEVELS;  Surgeon: Peggyann Shoals, MD;  Location: [facility];  Service: Neurosurgery;  Laterality: N/A;  exploration of Cervical four - seven  fusion with redo Cervical six- seven, cervical three- four anterior cervical decompression with fusion interbody prothesis plating and bonegraft and C34 anterior cervical decompression with inte   APPENDECTOMY     at age 4   CARDIAC CATHETERIZATION  06/03/04   COLONOSCOPY     EP IMPLANTABLE DEVICE N/A 09/14/2015   Procedure: Loop Recorder Insertion;  Surgeon: Evans Lance, MD;  Location: [facility];  Service: Cardiovascular;  Laterality: N/A;   HAND SURGERY Right 2005   right   INNER EAR SURGERY Bilateral    bil;titanium in both ears   lower back surgery  2009   had plates and screws inserted   NECK SURGERY  2009   had insertion of  plates and screws   TEE WITHOUT CARDIOVERSION N/A 09/14/2015   Procedure: TRANSESOPHAGEAL ECHOCARDIOGRAM (TEE);  Surgeon: Thayer Headings, MD;  Location: [facility];  Service: Cardiovascular;  Laterality: N/A;   TRANSFORAMINAL LUMBAR INTERBODY FUSION (TLIF) WITH PEDICLE SCREW FIXATION 1 LEVEL Left 09/27/2019   Procedure: Left Lumbar 5 Sacral 1 Transforaminal lumbar interbody fusion with exploration/removal of adjacent level hardware;  Surgeon: Erline Levine, MD;  Location: [facility];  Service: Neurosurgery;  Laterality: Left;  3C/RM 12   Family History  Problem Relation Age of Onset   Colon cancer Father    Other Mother        Perforated bowel   Colon cancer Maternal Grandmother    Colon cancer Maternal Grandfather    Heart disease Maternal Uncle    Stroke Maternal Uncle    Cancer Brother    Healthy Son    Healthy Daughter    Anesthesia problems Neg Hx     Hypotension Neg Hx    Malignant hyperthermia Neg Hx    Pseudochol deficiency Neg Hx    Social History   Socioeconomic History   Marital status: Widowed    Spouse name: Pamala Hurry   Number of children: 2   Years of education: 8th Grade   Highest education level: 8th grade  Occupational History   Occupation: disabled    Employer: DISABLED  Tobacco Use   Smoking status: Former    Packs/day: 1.00    Years: 41.00    Total pack years: 41.00    Types: Cigarettes    Quit date: 06/29/2002    Years since  quitting: 19.4   Smokeless tobacco: Never   Tobacco comments:    Pt quit smoking in 2004. 10/21/21  Vaping Use   Vaping Use: Never used  Substance and Sexual Activity   Alcohol use: Not Currently    Alcohol/week: 1.0 standard drink of alcohol    Types: 1 Cans of beer per week    Comment: occasional less than one a week   Drug use: No   Sexual activity: Yes    Birth control/protection: None  Other Topics Concern   Not on file  Social History Narrative   Lives alone, his wife passed away 02/03/19.  Has 2 children.     Currently does not work.  On disability for wrist pain since early 2000s.   Formerly a Administrator.   Social Determinants of Health   Financial Resource Strain: High Risk (11/19/2020)   Overall Financial Resource Strain (CARDIA)    Difficulty of Paying Living Expenses: Hard  Food Insecurity: No Food Insecurity (11/19/2020)   Hunger Vital Sign    Worried About Running Out of Food in the Last Year: Never true    Ran Out of Food in the Last Year: Never true  Transportation Needs: No Transportation Needs (11/19/2020)   PRAPARE - Hydrologist (Medical): No    Lack of Transportation (Non-Medical): No  Physical Activity: Insufficiently Active (11/19/2020)   Exercise Vital Sign    Days of Exercise per Week: 7 days    Minutes of Exercise per Session: 20 min  Stress: No Stress Concern Present (11/19/2020)   [place]    Feeling of Stress : Only a little  Social Connections: Socially Isolated (11/19/2020)   Social Connection and Isolation Panel [NHANES]    Frequency of Communication with Friends and Family: More than three times a week    Frequency of Social Gatherings with Friends and Family: More than three times a week    Attends Religious Services: Never    Marine scientist or Organizations: No    Attends Archivist Meetings: Never    Marital Status: Widowed    Activities of Daily Living    11/22/2021    3:34 PM  In your present state of health, do you have any difficulty performing the following activities:  Hearing? 1  Comment Feels like he has an ear infection  Vision? 0  Comment Wears glasses  Difficulty concentrating or making decisions? 0  Walking or climbing stairs? 0  Dressing or bathing? 0  Doing errands, shopping? 0  Using the Toilet? N  In the past six months, have you accidently leaked urine? N  Do you have problems with loss of bowel control? N  Managing your Medications? N  Managing your Finances? N  Housekeeping or managing your Housekeeping? N    Patient Education/ Literacy How often do you need to have someone help you when you read instructions, pamphlets, or other written materials from your doctor or pharmacy?: 1 - Never  Exercise Current Exercise Habits: The patient has a physically strenuous job, but has no regular exercise apart from work., Type of exercise: walking, Intensity: Mild, Exercise limited by: None identified  Diet Patient reports consuming 3 meals a day and 2 snack(s) a day Patient reports that his primary diet is: Regular Patient reports that she does have regular access to food.   Depression Screen    11/22/2021    3:36 PM 08/30/2021  3:58 PM 02/01/2021    8:37 AM 11/19/2020    3:42 PM 08/03/2020    8:57 AM 08/03/2020    8:42 AM 11/21/2019    8:09 AM  PHQ 2/9 Scores  PHQ - 2 Score 0 0 0 0 0 0 0   PHQ- 9 Score  0          Fall Risk    11/22/2021    3:34 PM 08/30/2021    3:57 PM 02/01/2021    8:37 AM 11/19/2020    3:44 PM 08/03/2020    8:57 AM  Fall Risk   Falls in the past year? 0 0 0 0 0  Number falls in past yr: 0   0   Injury with Fall? 0   0   Risk for fall due to : Other (Comment)   Impaired vision;History of fall(s);Orthopedic patient   Risk for fall due to: Comment age      Follow up Education provided   Education provided;Falls prevention discussed      Objective:  Tiernan Suto seemed alert and oriented and he participated appropriately during our telephone visit.  Blood Pressure Weight BMI  BP Readings from Last 3 Encounters:  10/21/21 118/66  08/30/21 117/66  06/15/21 (!) 142/80   Wt Readings from Last 3 Encounters:  10/21/21 140 lb 6.4 oz (63.7 kg)  08/30/21 145 lb (65.8 kg)  06/15/21 146 lb 12.8 oz (66.6 kg)   BMI Readings from Last 1 Encounters:  10/21/21 20.15 kg/m    *Unable to obtain current vital signs, weight, and BMI due to telephone visit type  Hearing/Vision  Kahli did not seem to have difficulty with hearing/understanding during the telephone conversation Reports that he has had a formal eye exam by an eye care professional within the past year Reports that he has not had a formal hearing evaluation within the past year *Unable to fully assess hearing and vision during telephone visit type  Cognitive Function:    11/22/2021    3:36 PM 11/19/2020    3:49 PM 05/23/2019    9:04 AM  6CIT Screen  What Year?  0 points 0 points  What month? 0 points 0 points 0 points  What time? 0 points 0 points 0 points  Count back from 20 0 points 0 points 0 points  Months in reverse 0 points 0 points 0 points  Repeat phrase 2 points 4 points 4 points  Total Score  4 points 4 points   (Normal:0-7, Significant for Dysfunction: >8)  Normal Cognitive Function Screening: Yes   Immunization & Health Maintenance Record Immunization History  Administered  Date(s) Administered   Fluad Quad(high Dose 65+) 01/18/2019, 02/01/2021   Influenza, High Dose Seasonal PF 01/10/2018   Influenza,inj,Quad PF,6+ Mos 12/11/2012, 01/21/2014, 02/17/2015, 12/23/2015, 01/06/2017   Moderna Sars-Covid-2 Vaccination 07/12/2019, 08/15/2019   Zoster, Live 03/12/2013    Health Maintenance  Topic Date Due   INFLUENZA VACCINE  10/12/2021   Zoster Vaccines- Shingrix (1 of 2) 01/31/2022 (Originally 12/19/1969)   Pneumonia Vaccine 52+ Years old (1 - PCV) 02/01/2022 (Originally 12/18/2016)   TETANUS/TDAP  08/31/2022 (Originally 03/12/2016)   COVID-19 Vaccine (3 - Moderna risk series) 09/26/2022 (Originally 09/12/2019)   HEMOGLOBIN A1C  03/01/2022   COLONOSCOPY (Pts 45-10yrs Insurance coverage will need to be confirmed)  08/25/2024   Hepatitis C Screening  Completed   HPV VACCINES  Aged Out   FOOT EXAM  Discontinued   OPHTHALMOLOGY EXAM  Discontinued   URINE MICROALBUMIN  Discontinued       Assessment  This is a routine wellness examination for [employer].  Health Maintenance: Due or Overdue Health Maintenance Due  Topic Date Due   INFLUENZA VACCINE  10/12/2021    [name] does not need a referral for Community Assistance: Care Management:   no Social Work:    no Prescription Assistance:  no Nutrition/Diabetes Education:  no   Plan:  Personalized Goals  Goals Addressed             This Visit's Progress    AWV       11/22/2021 AWV Goal: Fall Prevention  Over the next year, patient will decrease their risk for falls by: Using assistive devices, such as a cane or walker, as needed Identifying fall risks within their home and correcting them by: Removing throw rugs Adding handrails to stairs or ramps Removing clutter and keeping a clear pathway throughout the home Increasing light, especially at night Adding shower handles/bars Raising toilet seat Identifying potential personal risk factors for falls: Medication side  effects Incontinence/urgency Vestibular dysfunction Hearing loss Musculoskeletal disorders Neurological disorders Orthostatic hypotension         Personalized Health Maintenance & Screening Recommendations  Pneumococcal vaccine  Influenza vaccine Shingles vaccine   Lung Cancer Screening Recommended: no-pt has lung biopsy scheduled for 09/19 (Low Dose CT Chest recommended if Age 39-80 years, 30 pack-year currently smoking OR have quit w/in past 15 years) Hepatitis C Screening recommended: no HIV Screening recommended: no  Advanced Directives: Written information was not prepared per patient's request.  Referrals & Orders No orders of the defined types were placed in this encounter.   Follow-up Plan Follow-up with Dettinger, [name], MD as planned Schedule flu shot appointment AVS printed and mailed to patient    I have personally reviewed and noted the following in the patient's chart:   Medical and social history Use of alcohol, tobacco or illicit drugs  Current medications and supplements Functional ability and status Nutritional status Physical activity Advanced directives List of other physicians Hospitalizations, surgeries, and ER visits in previous 12 months Vitals Screenings to include cognitive, depression, and falls Referrals and appointments  In addition, I have reviewed and discussed with [name] certain preventive protocols, quality metrics, and best practice recommendations. A written personalized care plan for preventive services as well as general preventive health recommendations is available and can be mailed to the patient at his request.      Lynnea Ferrier, LPN  04/13/[id number]

## 2021-11-25 ENCOUNTER — Telehealth: Payer: Self-pay | Admitting: Internal Medicine

## 2021-11-25 NOTE — Telephone Encounter (Signed)
Patient called, states he is in severe pain and he would like to speak to a nurse. Please call to advise.

## 2021-11-25 NOTE — Telephone Encounter (Signed)
Pt has not been seen since 2021. Pt c/o lots of gas, reports that his omeprazole has not been working. Suggested he take the omeprazole twice a day to see if that helps prior to his appt. Pt states he has tried that and it doesn't work. Only wants to see Dr. Henrene Pastor. Pt scheduled to see Dr. Henrene Pastor 12/30/21 at 10am. Pt encouraged to see his PCP if he needs to be seen prior to his appt, he states "his PCP ain't no good." Suggested he could go to an urgent care if needed. Pt aware of appt.

## 2021-11-29 ENCOUNTER — Encounter (HOSPITAL_COMMUNITY): Payer: Self-pay | Admitting: Pulmonary Disease

## 2021-11-29 ENCOUNTER — Other Ambulatory Visit: Payer: Self-pay

## 2021-11-29 NOTE — Progress Notes (Signed)
Anesthesia Chart Review: Kathleene Hazel  Case: [id number] Date/Time: 11/30/21 1415   Procedures:      ROBOTIC ASSISTED NAVIGATIONAL BRONCHOSCOPY (Bilateral)     VIDEO BRONCHOSCOPY WITH ENDOBRONCHIAL ULTRASOUND   Anesthesia type: General   Diagnosis: Lung mass [R91.8]   Pre-op diagnosis: lung mass   Location: [facility] / [place] [facility]   Surgeons: Garner Nash, DO       DISCUSSION: Patient is a 71 year old male scheduled for the above procedure.  CXR and T-spine imaging in late June showed a LLL lung lesion which was confirmed (5.2 cm) on 09/06/21 Chest CT through [facility]. Referred to pulmonology by [facility]. He was evaluated by Dr. Silas Flood on 10/21/21 with the above procedure planned. Procedure has been rescheduled a few times since late August due to issues arranging either care from his animals or transportation.  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   Last cardiology visit with Dr. Lovena Le was on 06/15/2021. He was mostly maintaining NSR. Continue Eliquis. No recurrent CVA symptoms and "working without limit". Increase salty foods recommended for orthostasis.  1 year follow-up planned.  Pulmonology had previously instructed him to  hold Eliquis 3 days prior to surgery.   VS:  BP Readings from Last 3 Encounters:  10/21/21 118/66  08/30/21 117/66  06/15/21 (!) 142/80   Pulse Readings from Last 3 Encounters:  10/21/21 86  08/30/21 69  06/15/21 92     PROVIDERS: Dettinger, [name], MD is PCP  Larey Days, MD is pulmonologist [name], MD is EP cardiologist Scarlette Shorts, MD is GI Roosevelt Locks, NP is hepatology provider. Last visit 10/01/21 ([facility]). Tonia Brooms, MD is HEM-ONC. Evaluation 09/21/21 for lung lesion and referred to pulmonology for consideration of biopsy ([facility]).    LABS: For day of surgery as indicated. Last results in [facility] include: Lab Results  Component Value Date   WBC 11.4 (H) 08/30/2021   HGB 11.1 (L) 08/30/2021   HCT 32.5 (L) 08/30/2021   PLT 361 08/30/2021   GLUCOSE 99 08/30/2021   ALT 6 08/30/2021   AST 11 08/30/2021   NA 140 08/30/2021   K 4.4 08/30/2021   CL 106 08/30/2021   CREATININE 0.93 08/30/2021   BUN 17 08/30/2021   CO2 22 08/30/2021   HGBA1C 5.7 (H) 08/30/2021     IMAGES: CT super D chest 11/03/2021: IMPRESSION: 1. Large LEFT lower lobe mass with peripheral satellite nodules, septal thickening and adjacent hilar adenopathy. 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 2. Extension into extrapleural fat is possible and the lesion abuts the thoracic aorta as well as the LEFT hilum and distorts the major fissure tethering the major fissure in the LEFT chest. 3. Subcarinal nodal enlargement is mild but suspicious given other findings. No adenopathy by size criteria in contralateral mediastinum or thoracic inlet. No sign of pleural effusion. 4. Calcified coronary artery disease  and aortic atherosclerosis - Aortic Atherosclerosis (ICD10-I70.0) and Emphysema (ICD10-J43.9).  [place] Abd 10/06/2021: IMPRESSION: 1. Cholelithiasis in an otherwise normal appearing gallbladder. 2. Increased heterogeneous echotexture throughout the liver consistent  with reported cirrhosis. No liver mass identified. No ascites.   EKG: 06/15/2021: Normal sinus rhythm   CV: TEE 09/24/2015 (for CVA evaluation): Study Conclusions  - Procedure narrative: Transesophageal echocardiography. An adult    multiplane transesophageal probe was inserted by the attending    cardiologistwithout difficulty. Image quality was poor. This was    a technically difficult study.  - Left ventricle: Systolic function was normal. The estimated    ejection fraction was in the range of 60% to 65%.  - Aortic valve: No evidence of vegetation.  - Left atrium: No evidence of thrombus in the atrial cavity or    appendage.  - Atrial septum: No defect or patent foramen ovale was identified.    TTE 09/13/2015: Study Conclusions  - Left ventricle: The cavity size was normal. Wall thickness was    normal. Systolic function was normal. The estimated ejection    fraction was in the range of 55% to 60%. Wall motion was normal;    there were no regional wall motion abnormalities. Doppler    parameters are consistent with abnormal left ventricular    relaxation (grade 1 diastolic dysfunction).  - Aortic root: The aortic root was mildly dilated.  - Atrial septum: There was increased thickness of the septum,    consistent with lipomatous hypertrophy.  Impressions:  - Normal LV systolic function; grade 1 diastolic dysfunction;    mildly dilated aortic root.    US Carotid 09/13/2015: Summary:  Bilateral: intimal wall thickening CCA. Mild mixed plaque origin  ICA. 1-39% ICA plaquing. Vertebral artery flow is antegrade.    RHC/LHC 06/03/2004: CONCLUSION:  1.  Well-preserved left ventricular function.  2.  No critical coronary obstruction.  3.  Normal right heart pressures.  4.  The current findings do not suggest significant coronary obstruction.      His right heart pressures are clearly normal at the present time. The      patient does have an elevated hemoglobin at 17.5. He may  have a      combination of hemochromatosis and/or smoker's polycythemia...   Past Medical History:  Diagnosis Date   Anxiety    Arthritis    Atrial fibrillation (HCC)    Chronic back pain    buldging disc   Chronic neck pain    ruptured disc   Cough    hx smoking   Diverticulosis    Esophageal stricture    GERD (gastroesophageal reflux disease)    takes Prilosec daily   H/O hiatal hernia    Hearing loss    Hepatitis-C 2017   Hiatal hernia    History of colon polyps    Insomnia    takes Elavil nightly   Lipoma of abdominal wall 02/11/2011   Stroke ([place])    Type 2 diabetes mellitus ([place]) 07/22/2020    Past Surgical History:  Procedure Laterality Date   ANTERIOR CERVICAL DECOMP/DISCECTOMY FUSION  04/12/2011   Procedure: ANTERIOR CERVICAL DECOMPRESSION/DISCECTOMY FUSION 2 LEVELS;  Surgeon: Peggyann Shoals, MD;  Location: [place] [facility];  Service: Neurosurgery;  Laterality: N/A;  exploration of Cervical four - seven  fusion with redo Cervical six- seven, cervical three- four anterior cervical decompression with fusion interbody prothesis plating and bonegraft and C34 anterior cervical decompression with inte   APPENDECTOMY  at age 60   CARDIAC CATHETERIZATION  06/03/04   COLONOSCOPY     EP IMPLANTABLE DEVICE N/A 09/14/2015   Procedure: Loop Recorder Insertion;  Surgeon: Evans Lance, MD;  Location: [place] [facility];  Service: Cardiovascular;  Laterality: N/A;   HAND SURGERY Right 2005   right   INNER EAR SURGERY Bilateral    bil;titanium in both ears   lower back surgery  2009   had plates and screws inserted   NECK SURGERY  2009   had insertion of  plates and screws   TEE WITHOUT CARDIOVERSION N/A 09/14/2015   Procedure: TRANSESOPHAGEAL ECHOCARDIOGRAM (TEE);  Surgeon: Thayer Headings, MD;  Location: [place];  Service: Cardiovascular;  Laterality: N/A;   TRANSFORAMINAL LUMBAR INTERBODY FUSION (TLIF) WITH PEDICLE SCREW FIXATION 1 LEVEL Left 09/27/2019   Procedure: Left  Lumbar 5 Sacral 1 Transforaminal lumbar interbody fusion with exploration/removal of adjacent level hardware;  Surgeon: Erline Levine, MD;  Location: [place];  Service: Neurosurgery;  Laterality: Left;  3C/RM 19    MEDICATIONS:  methylPREDNISolone acetate (DEPO-MEDROL) injection 80 mg    ELIQUIS 5 MG TABS tablet   fluticasone (FLONASE) 50 MCG/ACT nasal spray   gabapentin (NEURONTIN) 300 MG capsule   HYDROcodone-acetaminophen (NORCO) 10-325 MG tablet   Omega-3 Fatty Acids (FISH OIL) 1000 MG CAPS   omeprazole (PRILOSEC) 20 MG capsule   pravastatin (PRAVACHOL) 20 MG tablet   tamsulosin (FLOMAX) 0.4 MG CAPS capsule    Myra Gianotti, PA-C Surgical Short Stay/Anesthesiology [facility] Phone [phone] [facility] Phone [phone] 11/29/2021 1:42 PM

## 2021-11-29 NOTE — Anesthesia Preprocedure Evaluation (Addendum)
Anesthesia Evaluation  Patient identified by MRN, date of birth, ID band Patient awake    Reviewed: Allergy & Precautions, NPO status , Patient's Chart, lab work & pertinent test results  History of Anesthesia Complications Negative for: history of anesthetic complications  Airway Mallampati: I  TM Distance: >3 FB Neck ROM: Full    Dental  (+) Edentulous Upper, Edentulous Lower   Pulmonary COPD, former smoker,  1. Large LEFT lower lobe mass with peripheral satellite nodules, septal thickening and adjacent hilar adenopathy. 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 2. Extension into extrapleural fat is possible and the lesion abuts the thoracic aorta as well as the LEFT hilum and distorts the major fissure tethering the major fissure in the LEFT chest. 3. Subcarinal nodal enlargement is mild    breath sounds clear to auscultation       Cardiovascular + dysrhythmias (has loop recorder) Atrial Fibrillation  Rhythm:Irregular Rate:Normal  '17 ECHO: Left ventricle: Systolic function was normal, EF 60- 65%. Normal LVF, normal RVF, no significant valvular abnormalities   Neuro/Psych Anxiety CVA    GI/Hepatic hiatal hernia, GERD  Medicated and Poorly Controlled,(+) Cirrhosis  (by CT scan)      ,   Endo/Other  diabetes  Renal/GU      Musculoskeletal  (+) Arthritis ,   Abdominal   Peds  Hematology  (+) Blood dyscrasia (Hb 10.9), anemia , Eliquis: last dose sat   Anesthesia Other Findings   Reproductive/Obstetrics                            Anesthesia Physical Anesthesia Plan  ASA: 3  Anesthesia Plan: General   Post-op Pain Management: Tylenol PO (pre-op)*   Induction: Intravenous  PONV Risk Score and Plan: 2 and Ondansetron, Dexamethasone and Treatment may vary due to age or medical condition  Airway Management Planned: Oral ETT  Additional Equipment: None  Intra-op  Plan:   Post-operative Plan: Extubation in OR  Informed Consent: I have reviewed the patients History and Physical, chart, labs and discussed the procedure including the risks, benefits and alternatives for the proposed anesthesia with the patient or authorized representative who has indicated his/her understanding and acceptance.       Plan Discussed with: CRNA and Surgeon  Anesthesia Plan Comments: (PAT note written 11/29/2021 by Shonna Chock, PA-C. )       Anesthesia Quick Evaluation

## 2021-11-29 NOTE — Progress Notes (Signed)
  EKG - 06/15/21 Chest x-ray - ECHO - 09/14/15 Cardiac Cath - 05/2004 DM - Patient denies  Blood Thinner Instructions: Eliquis  COVID TEST- DOS  Anesthesia review: Yes  -------------  SDW INSTRUCTIONS:  Your procedure is scheduled on 11/30/21. Please report to [facility] Entrance "A" at 1115 A.M., and check in at the Admitting office. Call this number if you have problems the morning of surgery: [phone]   Remember: Do not eat after midnight the night before your surgery  You may drink clear liquids until 1115 the morning of your surgery.   Clear liquids allowed are: Water, Non-Citrus Juices (without pulp), Carbonated Beverages, Clear Tea, Black Coffee Only, and Gatorade   Medications to take morning of surgery with a sip of water include: Flonase, gabapentin, Vicodin, Prilosec, Tamsulosin  As of today, STOP taking any Aspirin (unless otherwise instructed by your surgeon), Aleve, Naproxen, Ibuprofen, Motrin, Advil, Goody's, BC's, all herbal medications, fish oil, and all vitamins.    The Morning of Surgery Do not wear jewelry Do not wear lotions, powders, or colognes, or deodorant Do not bring valuables to the hospital. [facility] is not responsible for any belongings or valuables.  If you are a smoker, DO NOT Smoke 24 hours prior to surgery  If you wear a CPAP at night please bring your mask the morning of surgery   Remember that you must have someone to transport you home after your surgery, and remain with you for 24 hours if you are discharged the same day.  Please bring cases for contacts, glasses, hearing aids, dentures or bridgework because it cannot be worn into surgery.   Patients discharged the day of surgery will not be allowed to drive home.   Please shower the NIGHT BEFORE/MORNING OF SURGERY (use antibacterial soap like DIAL soap if possible). Wear comfortable clothes the morning of surgery. Oral Hygiene is also important to reduce your risk of infection.   Remember - BRUSH YOUR TEETH THE MORNING OF SURGERY WITH YOUR REGULAR TOOTHPASTE  Patient denies shortness of breath, fever, cough and chest pain.    Lost call with patient twice.  Left message for instructions.

## 2021-11-30 ENCOUNTER — Ambulatory Visit (HOSPITAL_COMMUNITY)
Admission: RE | Admit: 2021-11-30 | Discharge: 2021-11-30 | Disposition: A | Discharge status: Home / Self Care | Payer: Medicare Other | Attending: Pulmonary Disease | Admitting: Pulmonary Disease

## 2021-11-30 ENCOUNTER — Ambulatory Visit (HOSPITAL_BASED_OUTPATIENT_CLINIC_OR_DEPARTMENT_OTHER): Payer: Medicare Other | Admitting: Vascular Surgery

## 2021-11-30 ENCOUNTER — Encounter (HOSPITAL_COMMUNITY)
Admission: RE | Disposition: A | Discharge status: Home / Self Care | Payer: Self-pay | Source: Home / Self Care | Attending: Pulmonary Disease

## 2021-11-30 ENCOUNTER — Encounter (HOSPITAL_COMMUNITY): Payer: Self-pay | Admitting: Pulmonary Disease

## 2021-11-30 ENCOUNTER — Ambulatory Visit (HOSPITAL_COMMUNITY): Payer: Medicare Other

## 2021-11-30 ENCOUNTER — Ambulatory Visit (HOSPITAL_COMMUNITY): Payer: Medicare Other | Admitting: Vascular Surgery

## 2021-11-30 DIAGNOSIS — C3432 Malignant neoplasm of lower lobe, left bronchus or lung: Secondary | ICD-10-CM | POA: Insufficient documentation

## 2021-11-30 DIAGNOSIS — J449 Chronic obstructive pulmonary disease, unspecified: Secondary | ICD-10-CM

## 2021-11-30 DIAGNOSIS — Z8673 Personal history of transient ischemic attack (TIA), and cerebral infarction without residual deficits: Secondary | ICD-10-CM | POA: Diagnosis not present

## 2021-11-30 DIAGNOSIS — R918 Other nonspecific abnormal finding of lung field: Secondary | ICD-10-CM | POA: Diagnosis present

## 2021-11-30 DIAGNOSIS — E119 Type 2 diabetes mellitus without complications: Secondary | ICD-10-CM | POA: Insufficient documentation

## 2021-11-30 DIAGNOSIS — Z87891 Personal history of nicotine dependence: Secondary | ICD-10-CM | POA: Diagnosis not present

## 2021-11-30 DIAGNOSIS — K219 Gastro-esophageal reflux disease without esophagitis: Secondary | ICD-10-CM | POA: Insufficient documentation

## 2021-11-30 DIAGNOSIS — I4891 Unspecified atrial fibrillation: Secondary | ICD-10-CM | POA: Diagnosis not present

## 2021-11-30 DIAGNOSIS — F419 Anxiety disorder, unspecified: Secondary | ICD-10-CM | POA: Insufficient documentation

## 2021-11-30 DIAGNOSIS — Z20822 Contact with and (suspected) exposure to covid-19: Secondary | ICD-10-CM | POA: Diagnosis not present

## 2021-11-30 DIAGNOSIS — Z7901 Long term (current) use of anticoagulants: Secondary | ICD-10-CM | POA: Insufficient documentation

## 2021-11-30 DIAGNOSIS — M1990 Unspecified osteoarthritis, unspecified site: Secondary | ICD-10-CM | POA: Diagnosis not present

## 2021-11-30 DIAGNOSIS — C3492 Malignant neoplasm of unspecified part of left bronchus or lung: Secondary | ICD-10-CM | POA: Diagnosis not present

## 2021-11-30 DIAGNOSIS — K449 Diaphragmatic hernia without obstruction or gangrene: Secondary | ICD-10-CM | POA: Insufficient documentation

## 2021-11-30 HISTORY — PX: BRONCHIAL BRUSHINGS: SHX5108

## 2021-11-30 HISTORY — PX: FINE NEEDLE ASPIRATION: SHX5430

## 2021-11-30 HISTORY — PX: VIDEO BRONCHOSCOPY WITH ENDOBRONCHIAL ULTRASOUND: SHX6177

## 2021-11-30 HISTORY — PX: BRONCHIAL BIOPSY: SHX5109

## 2021-11-30 LAB — CBC
HCT: 32.3 % — ABNORMAL LOW (ref 39.0–52.0)
Hemoglobin: 10.9 g/dL — ABNORMAL LOW (ref 13.0–17.0)
MCH: 32.1 pg (ref 26.0–34.0)
MCHC: 33.7 g/dL (ref 30.0–36.0)
MCV: 95 fL (ref 80.0–100.0)
Platelets: 342 10*3/uL (ref 150–400)
RBC: 3.4 MIL/uL — ABNORMAL LOW (ref 4.22–5.81)
RDW: 14.7 % (ref 11.5–15.5)
WBC: 12.6 10*3/uL — ABNORMAL HIGH (ref 4.0–10.5)
nRBC: 0 % (ref 0.0–0.2)

## 2021-11-30 LAB — SARS CORONAVIRUS 2 BY RT PCR: SARS Coronavirus 2 by RT PCR: NEGATIVE

## 2021-11-30 SURGERY — BRONCHOSCOPY, WITH EBUS
Anesthesia: General

## 2021-11-30 MED ORDER — ROCURONIUM BROMIDE 10 MG/ML (PF) SYRINGE
PREFILLED_SYRINGE | INTRAVENOUS | Status: DC | PRN
  Administered 2021-11-30: 60 mg via INTRAVENOUS

## 2021-11-30 MED ORDER — LIDOCAINE 2% (20 MG/ML) 5 ML SYRINGE
INTRAMUSCULAR | Status: DC | PRN
  Administered 2021-11-30: 20 mg via INTRAVENOUS

## 2021-11-30 MED ORDER — DEXAMETHASONE SODIUM PHOSPHATE 10 MG/ML IJ SOLN
INTRAMUSCULAR | Status: DC | PRN
  Administered 2021-11-30: 10 mg via INTRAVENOUS

## 2021-11-30 MED ORDER — FENTANYL CITRATE (PF) 250 MCG/5ML IJ SOLN
INTRAMUSCULAR | Status: DC | PRN
  Administered 2021-11-30: 50 ug via INTRAVENOUS

## 2021-11-30 MED ORDER — CHLORHEXIDINE GLUCONATE 0.12 % MT SOLN
OROMUCOSAL | Status: AC
  Administered 2021-11-30: 15 mL via OROMUCOSAL
  Filled 2021-11-30: qty 15

## 2021-11-30 MED ORDER — LACTATED RINGERS IV SOLN: INTRAVENOUS | Status: DC

## 2021-11-30 MED ORDER — ACETAMINOPHEN 500 MG PO TABS
1000.0000 mg | ORAL_TABLET | Freq: Once | ORAL | Status: AC
  Administered 2021-11-30: 1000 mg via ORAL
  Filled 2021-11-30: qty 2

## 2021-11-30 MED ORDER — CHLORHEXIDINE GLUCONATE 0.12 % MT SOLN
15.0000 mL | Freq: Once | OROMUCOSAL | Status: AC
  Filled 2021-11-30: qty 15

## 2021-11-30 MED ORDER — SUGAMMADEX SODIUM 200 MG/2ML IV SOLN
INTRAVENOUS | Status: DC | PRN
  Administered 2021-11-30: 200 mg via INTRAVENOUS

## 2021-11-30 MED ORDER — ONDANSETRON HCL 4 MG/2ML IJ SOLN
INTRAMUSCULAR | Status: DC | PRN
  Administered 2021-11-30: 4 mg via INTRAVENOUS

## 2021-11-30 MED ORDER — PROPOFOL 10 MG/ML IV BOLUS
INTRAVENOUS | Status: DC | PRN
  Administered 2021-11-30: 200 mg via INTRAVENOUS

## 2021-11-30 SURGICAL SUPPLY — 30 items
BRUSH CYTOL CELLEBRITY 1.5X140 (MISCELLANEOUS) IMPLANT
CANISTER SUCT 3000ML PPV (MISCELLANEOUS) ×3 IMPLANT
CONT SPEC 4OZ CLIKSEAL STRL BL (MISCELLANEOUS) ×3 IMPLANT
COVER BACK TABLE 60X90IN (DRAPES) ×3 IMPLANT
COVER DOME SNAP 22 D (MISCELLANEOUS) ×3 IMPLANT
FORCEPS BIOP RJ4 1.8 (CUTTING FORCEPS) IMPLANT
GAUZE SPONGE 4X4 12PLY STRL (GAUZE/BANDAGES/DRESSINGS) ×3 IMPLANT
GLOVE BIO SURGEON STRL SZ7.5 (GLOVE) ×3 IMPLANT
GOWN STRL REUS W/ TWL LRG LVL3 (GOWN DISPOSABLE) ×3 IMPLANT
GOWN STRL REUS W/TWL LRG LVL3 (GOWN DISPOSABLE) ×3
KIT CLEAN ENDO COMPLIANCE (KITS) ×6 IMPLANT
KIT TURNOVER KIT B (KITS) ×3 IMPLANT
MARKER SKIN DUAL TIP RULER LAB (MISCELLANEOUS) ×3 IMPLANT
NDL EBUS SONO TIP PENTAX (NEEDLE) ×2 IMPLANT
NEEDLE EBUS SONO TIP PENTAX (NEEDLE) ×3 IMPLANT
NS IRRIG 1000ML POUR BTL (IV SOLUTION) ×3 IMPLANT
OIL SILICONE PENTAX (PARTS (SERVICE/REPAIRS)) ×3 IMPLANT
PAD ARMBOARD 7.5X6 YLW CONV (MISCELLANEOUS) ×6 IMPLANT
SOL ANTI FOG 6CC (MISCELLANEOUS) ×3 IMPLANT
SOLUTION ANTI FOG 6CC (MISCELLANEOUS) ×3
SYR 20CC LL (SYRINGE) ×6 IMPLANT
SYR 20ML ECCENTRIC (SYRINGE) ×6 IMPLANT
SYR 50ML SLIP (SYRINGE) IMPLANT
SYR 5ML LUER SLIP (SYRINGE) ×3 IMPLANT
TOWEL OR 17X24 6PK STRL BLUE (TOWEL DISPOSABLE) ×3 IMPLANT
TRAP SPECIMEN MUCOUS 40CC (MISCELLANEOUS) IMPLANT
TUBE CONNECTING 20X1/4 (TUBING) ×6 IMPLANT
UNDERPAD 30X30 (UNDERPADS AND DIAPERS) ×3 IMPLANT
VALVE DISPOSABLE (MISCELLANEOUS) ×3 IMPLANT
WATER STERILE IRR 1000ML POUR (IV SOLUTION) ×3 IMPLANT

## 2021-11-30 NOTE — Interval H&P Note (Signed)
History and Physical Interval Note:  11/30/2021 12:03 PM  Cameron Roman  has presented today for surgery, with the diagnosis of lung mass.  The various methods of treatment have been discussed with the patient and family. After consideration of risks, benefits and other options for treatment, the patient has consented to  Procedure(s): ROBOTIC ASSISTED NAVIGATIONAL BRONCHOSCOPY (Bilateral) VIDEO BRONCHOSCOPY WITH ENDOBRONCHIAL ULTRASOUND (N/A) as a surgical intervention.  The patient's history has been reviewed, patient examined, no change in status, stable for surgery.  I have reviewed the patient's chart and labs.  Questions were answered to the patient's satisfaction.     [place]

## 2021-11-30 NOTE — Anesthesia Procedure Notes (Signed)
Procedure Name: Intubation Date/Time: 11/30/2021 1:20 PM  Performed by: Betha Loa, CRNAPre-anesthesia Checklist: Patient identified, Emergency Drugs available, Suction available and Patient being monitored Patient Re-evaluated:Patient Re-evaluated prior to induction Oxygen Delivery Method: Circle System Utilized Preoxygenation: Pre-oxygenation with 100% oxygen Induction Type: IV induction Ventilation: Mask ventilation without difficulty Laryngoscope Size: Mac and 4 Grade View: Grade I Tube type: Oral Tube size: 8.5 mm Number of attempts: 1 Airway Equipment and Method: Stylet and Oral airway Placement Confirmation: ETT inserted through vocal cords under direct vision, positive ETCO2 and breath sounds checked- equal and bilateral Secured at: 23 cm Tube secured with: Tape Dental Injury: Teeth and Oropharynx as per pre-operative assessment

## 2021-11-30 NOTE — H&P (Signed)
Synopsis: Referred in September 2023 for lung mass by Garner Nash, DO  Subjective:   PATIENT ID: Cameron Roman GENDER: male DOB: September 17, 1950, MRN: [id number]   This is a 71 year old gentlemanSeen in consultation for lung mass.  Patient had a chest x-ray in June completed at [facility].  Ultimately went to see oncology recommended referral to pulmonary for biopsy.  Had a PET scan that was complete.  Wife died 3 years ago from lung cancer.  He is a former smoker has atrial fibrillation on Eliquis.  He was originally scheduled a few weeks ago unable to obtain a ride to come home from the appointment.  Patient had a super D CT completed and 11/03/2021 prior to this case with a large left lower lobe mass concerning for malignancy.  There was extension into the extrapleural fat which abuts the lesion.  As well as the left hilum.  There is some seen in the subcarinal nodes that are enlarged concerning for metastasis.    Past Medical History:  Diagnosis Date   Anxiety    Arthritis    Atrial fibrillation (HCC)    Chronic back pain    buldging disc   Chronic neck pain    ruptured disc   Cough    hx smoking   Diverticulosis    Esophageal stricture    GERD (gastroesophageal reflux disease)    takes Prilosec daily   H/O hiatal hernia    Hearing loss    Hepatitis-C 2017   treated and cured per patient   Hiatal hernia    History of colon polyps    Insomnia    takes Elavil nightly   Lipoma of abdominal wall 02/11/2011   Stroke ([place])    Type 2 diabetes mellitus ([place]) 07/22/2020   patient denies DM, per MD note pt is pre-diabetic     Family History  Problem Relation Age of Onset   Colon cancer Father    Other Mother        Perforated bowel   Colon cancer Maternal Grandmother    Colon cancer Maternal Grandfather    Heart disease Maternal Uncle    Stroke Maternal Uncle    Cancer Brother    Healthy Son    Healthy Daughter    Anesthesia problems Neg Hx    Hypotension Neg Hx     Malignant hyperthermia Neg Hx    Pseudochol deficiency Neg Hx      Past Surgical History:  Procedure Laterality Date   ANTERIOR CERVICAL DECOMP/DISCECTOMY FUSION  04/12/2011   Procedure: ANTERIOR CERVICAL DECOMPRESSION/DISCECTOMY FUSION 2 LEVELS;  Surgeon: Peggyann Shoals, MD;  Location: [facility];  Service: Neurosurgery;  Laterality: N/A;  exploration of Cervical four - seven  fusion with redo Cervical six- seven, cervical three- four anterior cervical decompression with fusion interbody prothesis plating and bonegraft and C34 anterior cervical decompression with inte   APPENDECTOMY     at age 30   CARDIAC CATHETERIZATION  06/03/04   COLONOSCOPY     EP IMPLANTABLE DEVICE N/A 09/14/2015   Procedure: Loop Recorder Insertion;  Surgeon: Evans Lance, MD;  Location: [place] [facility];  Service: Cardiovascular;  Laterality: N/A;   HAND SURGERY Right 2005   right   INNER EAR SURGERY Bilateral    bil;titanium in both ears   lower back surgery  2009   had plates and screws inserted   NECK SURGERY  2009   had insertion of  plates and screws  TEE WITHOUT CARDIOVERSION N/A 09/14/2015   Procedure: TRANSESOPHAGEAL ECHOCARDIOGRAM (TEE);  Surgeon: Thayer Headings, MD;  Location: [place];  Service: Cardiovascular;  Laterality: N/A;   TRANSFORAMINAL LUMBAR INTERBODY FUSION (TLIF) WITH PEDICLE SCREW FIXATION 1 LEVEL Left 09/27/2019   Procedure: Left Lumbar 5 Sacral 1 Transforaminal lumbar interbody fusion with exploration/removal of adjacent level hardware;  Surgeon: Erline Levine, MD;  Location: [place];  Service: Neurosurgery;  Laterality: Left;  3C/RM 68    Social History   Socioeconomic History   Marital status: Widowed    Spouse name: Pamala Hurry   Number of children: 2   Years of education: 8th Grade   Highest education level: 8th grade  Occupational History   Occupation: disabled    Employer: DISABLED  Tobacco Use   Smoking status: Former    Packs/day: 1.00    Years: 41.00    Total pack  years: 41.00    Types: Cigarettes    Quit date: 06/29/2002    Years since quitting: 19.4   Smokeless tobacco: Never   Tobacco comments:    Pt quit smoking in 2004. 10/21/21  Vaping Use   Vaping Use: Never used  Substance and Sexual Activity   Alcohol use: Not Currently    Alcohol/week: 1.0 standard drink of alcohol    Types: 1 Cans of beer per week   Drug use: No   Sexual activity: Yes    Birth control/protection: None  Other Topics Concern   Not on file  Social History Narrative   Lives alone, his wife passed away 26-Jan-2019.  Has 2 children.     Currently does not work.  On disability for wrist pain since early 2000s.   Formerly a Administrator.   Social Determinants of Health   Financial Resource Strain: High Risk (11/19/2020)   Overall Financial Resource Strain (CARDIA)    Difficulty of Paying Living Expenses: Hard  Food Insecurity: No Food Insecurity (11/19/2020)   Hunger Vital Sign    Worried About Running Out of Food in the Last Year: Never true    Ran Out of Food in the Last Year: Never true  Transportation Needs: No Transportation Needs (11/19/2020)   PRAPARE - Hydrologist (Medical): No    Lack of Transportation (Non-Medical): No  Physical Activity: Insufficiently Active (11/19/2020)   Exercise Vital Sign    Days of Exercise per Week: 7 days    Minutes of Exercise per Session: 20 min  Stress: No Stress Concern Present (11/19/2020)   [place]    Feeling of Stress : Only a little  Social Connections: Socially Isolated (11/19/2020)   Social Connection and Isolation Panel [NHANES]    Frequency of Communication with Friends and Family: More than three times a week    Frequency of Social Gatherings with Friends and Family: More than three times a week    Attends Religious Services: Never    Marine scientist or Organizations: No    Attends Archivist Meetings: Never     Marital Status: Widowed  Intimate Partner Violence: Not At Risk (11/19/2020)   Humiliation, Afraid, Rape, and Kick questionnaire    Fear of Current or Ex-Partner: No    Emotionally Abused: No    Physically Abused: No    Sexually Abused: No     Allergies  Allergen Reactions   Percocet [Oxycodone-Acetaminophen] Itching     @ENCMEDSTART @  Review of Systems  Constitutional:  Negative for chills, fever, malaise/fatigue and weight loss.  HENT:  Negative for hearing loss, sore throat and tinnitus.   Eyes:  Negative for blurred vision and double vision.  Respiratory:  Positive for cough and shortness of breath. Negative for hemoptysis, sputum production, wheezing and stridor.   Cardiovascular:  Negative for chest pain, palpitations, orthopnea, leg swelling and PND.  Gastrointestinal:  Negative for abdominal pain, constipation, diarrhea, heartburn, nausea and vomiting.  Genitourinary:  Negative for dysuria, hematuria and urgency.  Musculoskeletal:  Negative for joint pain and myalgias.  Skin:  Negative for itching and rash.  Neurological:  Negative for dizziness, tingling, weakness and headaches.  Endo/Heme/Allergies:  Negative for environmental allergies. Does not bruise/bleed easily.  Psychiatric/Behavioral:  Negative for depression. The patient is not nervous/anxious and does not have insomnia.   All other systems reviewed and are negative.    Objective:  Physical Exam Vitals reviewed.  Constitutional:      General: He is not in acute distress.    Appearance: He is well-developed.     Comments: Thin cachectic  HENT:     Head: Normocephalic and atraumatic.  Eyes:     General: No scleral icterus.    Conjunctiva/sclera: Conjunctivae normal.     Pupils: Pupils are equal, round, and reactive to light.  Neck:     Vascular: No JVD.     Trachea: No tracheal deviation.  Cardiovascular:     Rate and Rhythm: Normal rate and regular rhythm.     Heart sounds: Normal heart sounds. No  murmur heard. Pulmonary:     Effort: Pulmonary effort is normal. No tachypnea, accessory muscle usage or respiratory distress.     Breath sounds: No stridor. No wheezing, rhonchi or rales.  Abdominal:     General: There is no distension.     Palpations: Abdomen is soft.     Tenderness: There is no abdominal tenderness.  Musculoskeletal:        General: No tenderness.     Cervical back: Neck supple.  Lymphadenopathy:     Cervical: No cervical adenopathy.  Skin:    General: Skin is warm and dry.     Capillary Refill: Capillary refill takes less than 2 seconds.     Findings: No rash.  Neurological:     Mental Status: He is alert and oriented to person, place, and time.  Psychiatric:        Behavior: Behavior normal.      Vitals:   11/30/21 1119  BP: (!) 146/71  Pulse: 68  Resp: 18  Temp: 98.3 F (36.8 C)  TempSrc: Oral  SpO2: 99%  Weight: 63.7 kg  Height: 5\' 10"  (1.778 m)   99% on RA BMI Readings from Last 3 Encounters:  11/30/21 20.15 kg/m  10/21/21 20.15 kg/m  08/30/21 20.22 kg/m   Wt Readings from Last 3 Encounters:  11/30/21 63.7 kg  10/21/21 63.7 kg  08/30/21 65.8 kg     CBC    Component Value Date/Time   WBC 12.6 (H) 11/30/2021 1150   RBC 3.40 (L) 11/30/2021 1150   HGB 10.9 (L) 11/30/2021 1150   HGB 11.1 (L) 08/30/2021 1659   HCT 32.3 (L) 11/30/2021 1150   HCT 32.5 (L) 08/30/2021 1659   PLT 342 11/30/2021 1150   PLT 361 08/30/2021 1659   MCV 95.0 11/30/2021 1150   MCV 92 08/30/2021 1659   MCH 32.1 11/30/2021 1150   MCHC 33.7 11/30/2021 1150   RDW 14.7 11/30/2021 1150  RDW 13.8 08/30/2021 1659   LYMPHSABS 5.3 (H) 08/30/2021 1659   MONOABS 1.3 (H) 09/11/2015 1528   EOSABS 0.2 08/30/2021 1659   BASOSABS 0.1 08/30/2021 1659     Chest Imaging: August 2023 CT chest: Large left lower lobe mass with satellite nodules, concerning for advanced stage disease.  Adjacent hilar adenopathy and the concern for subcarinal adenopathy. The patient's  images have been independently reviewed by me.    Pulmonary Functions Testing Results:     No data to display          FeNO:   Pathology:   Echocardiogram:   Heart Catheterization:     Assessment & Plan:   Large left lung mass with associated adenopathy concerning for an advanced age bronchogenic carcinoma.  Discussion:  Today we discussed plans for bronchoscopy. Initially this was scheduled with robotic assisted navigational bronchoscopy will be may be able just to be able to obtain a diagnosis with videobronchoscope endobronchial ultrasound and transbronchial needle aspiration. Patient is agreeable to proceed. We talked about the risk of bleeding and pneumothorax.   Current Facility-Administered Medications:    lactated ringers infusion, , Intravenous, Continuous, Annye Asa, MD, Last Rate: 10 mL/hr at 11/30/21 1140, New Bag at 11/30/21 [facility] 11/30/2021 11:59 AM

## 2021-11-30 NOTE — Op Note (Addendum)
Video Bronchoscopy with Endobronchial Ultrasound Procedure Note  Date of Operation: 11/30/2021  Pre-op Diagnosis: Left lung mass   Post-op Diagnosis: Left lung mass   Surgeon: Marquis Lunch, O   Assistants: None   Anesthesia: General endotracheal anesthesia  Operation: Flexible video fiberoptic bronchoscopy with endobronchial ultrasound and biopsies.  Estimated Blood Loss: Minimal  Complications: None   Indications and History: Cameron Roman is a 71 y.o. male with a left lung mass, adenopathy.  The risks, benefits, complications, treatment options and expected outcomes were discussed with the patient.  The possibilities of pneumothorax, pneumonia, reaction to medication, pulmonary aspiration, perforation of a viscus, bleeding, failure to diagnose a condition and creating a complication requiring transfusion or operation were discussed with the patient who freely signed the consent.    Description of Procedure: The patient was examined in the preoperative area and history and data from the preprocedure consultation were reviewed. It was deemed appropriate to proceed.  The patient was taken to [facility] endoscopy room 3, identified as Cameron Roman and the procedure verified as Flexible Video Fiberoptic Bronchoscopy.  A Time Out was held and the above information confirmed. After being taken to the operating room general anesthesia was initiated and the patient  was orally intubated. The video fiberoptic bronchoscope was introduced via the endotracheal tube and a general inspection was performed which showed normal right and left lung anatomy, there was visible occlusion of the left lower lobe superior segment with extrinsic compression and irregular appearing submucosa concerning for tumor infiltration. The standard scope was then withdrawn and the endobronchial ultrasound was used to identify and characterize the peritracheal, hilar and bronchial lymph nodes. Inspection showed visible mass  in the left hilum looking posteriorly, this also encompassed the hilar lymph nodes as well as enlarged subcarinal station 7 lymph nodes. Using real-time ultrasound guidance Wang needle biopsies were take from left hilar mass, Station 7 nodes and were sent for cytology.  Following the endobronchial ultrasound component we switched to standard therapeutic scope.  We completed brushings to the left lower lobe superior segment as well as endobronchial biopsies using the 2.8 mm [employer] forceps.  The patient tolerated the procedure well without apparent complications. There was no significant blood loss. The bronchoscope was withdrawn. Anesthesia was reversed and the patient was taken to the PACU for recovery.   Samples: 1. Wang needle biopsies from left hilar mass/left hilar lymph nodes 2. Wang needle biopsies from station 7 node 3.  Endobronchial biopsies left lower lobe 4.  Cytology brushing, left lower lobe  Plans:  The patient will be discharged from the PACU to home when recovered from anesthesia. We will review the cytology, pathology results with the patient when they become available. Outpatient followup will be with Garner Nash, DO.   Garner Nash, DO [facility] 11/30/2021 2:37 PM

## 2021-11-30 NOTE — Discharge Instructions (Signed)
Flexible Bronchoscopy, Care After This sheet gives you information about how to care for yourself after your test. Your doctor may also give you more specific instructions. If you have problems or questions, contact your doctor. Follow these instructions at home: Eating and drinking Do not eat or drink anything (not even water) for 2 hours after your test, or until your numbing medicine (local anesthetic) wears off. When your numbness is gone and your cough and gag reflexes have come back, you may: Eat only soft foods. Slowly drink liquids. The day after the test, go back to your normal diet. Driving Do not drive for 24 hours if you were given a medicine to help you relax (sedative). Do not drive or use heavy machinery while taking prescription pain medicine. General instructions  Take over-the-counter and prescription medicines only as told by your doctor. Return to your normal activities as told. Ask what activities are safe for you. Do not use any products that have nicotine or tobacco in them. This includes cigarettes and e-cigarettes. If you need help quitting, ask your doctor. Keep all follow-up visits as told by your doctor. This is important. It is very important if you had a tissue sample (biopsy) taken. Get help right away if: You have shortness of breath that gets worse. You get light-headed. You feel like you are going to pass out (faint). You have chest pain. You cough up: More than a little blood. More blood than before. Summary Do not eat or drink anything (not even water) for 2 hours after your test, or until your numbing medicine wears off. Do not use cigarettes. Do not use e-cigarettes. Get help right away if you have chest pain.  This information is not intended to replace advice given to you by your health care provider. Make sure you discuss any questions you have with your health care provider. Document Released: 12/26/2008 Document Revised: 02/10/2017 Document  Reviewed: 03/18/2016 Elsevier Patient Education  2020 [employer].

## 2021-11-30 NOTE — Anesthesia Postprocedure Evaluation (Signed)
Anesthesia Post Note  Patient: Cameron Roman  Procedure(s) Performed: VIDEO BRONCHOSCOPY WITH ENDOBRONCHIAL ULTRASOUND FINE NEEDLE ASPIRATION (FNA) LINEAR BRONCHIAL BIOPSIES BRONCHIAL BRUSHINGS     Patient location during evaluation: Endoscopy Anesthesia Type: General Level of consciousness: awake and alert, patient cooperative and oriented Pain management: pain level controlled Vital Signs Assessment: post-procedure vital signs reviewed and stable Respiratory status: nonlabored ventilation, spontaneous breathing and respiratory function stable Cardiovascular status: blood pressure returned to baseline and stable Postop Assessment: no apparent nausea or vomiting Anesthetic complications: no   No notable events documented.  Last Vitals:  Vitals:   11/30/21 1119 11/30/21 1420  BP: (!) 146/71 (!) 148/72  Pulse: 68 70  Resp: 18 17  Temp: 36.8 C 36.6 C  SpO2: 99% 99%    Last Pain:  Vitals:   11/30/21 1420  TempSrc: Temporal  PainSc: 0-No pain                 Sherre Wooton,E. Chasitee Zenker

## 2021-11-30 NOTE — Transfer of Care (Signed)
Immediate Anesthesia Transfer of Care Note  Patient: Cameron Roman  Procedure(s) Performed: VIDEO BRONCHOSCOPY WITH ENDOBRONCHIAL ULTRASOUND FINE NEEDLE ASPIRATION (FNA) LINEAR BRONCHIAL BIOPSIES BRONCHIAL BRUSHINGS  Patient Location: PACU, Endo  Anesthesia Type:General  Level of Consciousness: patient cooperative and responds to stimulation  Airway & Oxygen Therapy: Patient Spontanous Breathing  Post-op Assessment: Report given to RN and Post -op Vital signs reviewed and stable  Post vital signs: Reviewed and stable  Last Vitals:  Vitals Value Taken Time  BP 148/72 11/30/21 1420  Temp    Pulse 75 11/30/21 1421  Resp 17 11/30/21 1421  SpO2 99 % 11/30/21 1421  Vitals shown include unvalidated device data.  Last Pain:  Vitals:   11/30/21 1131  TempSrc:   PainSc: 4          Complications: No notable events documented.

## 2021-12-02 ENCOUNTER — Telehealth: Payer: Self-pay | Admitting: Internal Medicine

## 2021-12-02 NOTE — Telephone Encounter (Signed)
Scheduled appt per 9/21 staff msg. Pt is aware of appt date and time. Pt is aware to arrive 15 mins prior to appt time and to bring and updated insurance card. Pt is aware of appt location.

## 2021-12-03 LAB — CYTOLOGY - NON PAP

## 2021-12-09 NOTE — Progress Notes (Signed)
I called and spoke with patient regarding pathology results  Thanks,  [facility] 12/09/2021 9:41 AM

## 2021-12-15 ENCOUNTER — Other Ambulatory Visit: Payer: Self-pay

## 2021-12-15 DIAGNOSIS — R918 Other nonspecific abnormal finding of lung field: Secondary | ICD-10-CM

## 2021-12-16 ENCOUNTER — Encounter: Payer: Self-pay | Admitting: Internal Medicine

## 2021-12-16 ENCOUNTER — Inpatient Hospital Stay: Payer: Medicare Other | Attending: Internal Medicine | Admitting: Internal Medicine

## 2021-12-16 ENCOUNTER — Inpatient Hospital Stay: Payer: Medicare Other

## 2021-12-16 ENCOUNTER — Other Ambulatory Visit: Payer: Self-pay

## 2021-12-16 VITALS — BP 125/64 | HR 71 | Temp 98.3°F | Resp 17 | Ht 70.0 in | Wt 140.2 lb

## 2021-12-16 DIAGNOSIS — Z808 Family history of malignant neoplasm of other organs or systems: Secondary | ICD-10-CM

## 2021-12-16 DIAGNOSIS — E119 Type 2 diabetes mellitus without complications: Secondary | ICD-10-CM | POA: Diagnosis not present

## 2021-12-16 DIAGNOSIS — C771 Secondary and unspecified malignant neoplasm of intrathoracic lymph nodes: Secondary | ICD-10-CM | POA: Diagnosis not present

## 2021-12-16 DIAGNOSIS — F1011 Alcohol abuse, in remission: Secondary | ICD-10-CM

## 2021-12-16 DIAGNOSIS — C3490 Malignant neoplasm of unspecified part of unspecified bronchus or lung: Secondary | ICD-10-CM

## 2021-12-16 DIAGNOSIS — C3432 Malignant neoplasm of lower lobe, left bronchus or lung: Secondary | ICD-10-CM | POA: Diagnosis not present

## 2021-12-16 DIAGNOSIS — Z87891 Personal history of nicotine dependence: Secondary | ICD-10-CM | POA: Diagnosis not present

## 2021-12-16 DIAGNOSIS — R591 Generalized enlarged lymph nodes: Secondary | ICD-10-CM | POA: Diagnosis not present

## 2021-12-16 DIAGNOSIS — R918 Other nonspecific abnormal finding of lung field: Secondary | ICD-10-CM

## 2021-12-16 DIAGNOSIS — Z5111 Encounter for antineoplastic chemotherapy: Secondary | ICD-10-CM | POA: Diagnosis not present

## 2021-12-16 DIAGNOSIS — C3412 Malignant neoplasm of upper lobe, left bronchus or lung: Secondary | ICD-10-CM | POA: Insufficient documentation

## 2021-12-16 LAB — CBC WITH DIFFERENTIAL (CANCER CENTER ONLY)
Abs Immature Granulocytes: 0.02 10*3/uL (ref 0.00–0.07)
Basophils Absolute: 0.1 10*3/uL (ref 0.0–0.1)
Basophils Relative: 1 %
Eosinophils Absolute: 0.2 10*3/uL (ref 0.0–0.5)
Eosinophils Relative: 1 %
HCT: 31.7 % — ABNORMAL LOW (ref 39.0–52.0)
Hemoglobin: 11 g/dL — ABNORMAL LOW (ref 13.0–17.0)
Immature Granulocytes: 0 %
Lymphocytes Relative: 38 %
Lymphs Abs: 4.9 10*3/uL — ABNORMAL HIGH (ref 0.7–4.0)
MCH: 32.4 pg (ref 26.0–34.0)
MCHC: 34.7 g/dL (ref 30.0–36.0)
MCV: 93.2 fL (ref 80.0–100.0)
Monocytes Absolute: 1 10*3/uL (ref 0.1–1.0)
Monocytes Relative: 8 %
Neutro Abs: 6.9 10*3/uL (ref 1.7–7.7)
Neutrophils Relative %: 52 %
Platelet Count: 348 10*3/uL (ref 150–400)
RBC: 3.4 MIL/uL — ABNORMAL LOW (ref 4.22–5.81)
RDW: 14.6 % (ref 11.5–15.5)
WBC Count: 13.1 10*3/uL — ABNORMAL HIGH (ref 4.0–10.5)
nRBC: 0 % (ref 0.0–0.2)

## 2021-12-16 LAB — CMP (CANCER CENTER ONLY)
ALT: 5 U/L (ref 0–44)
AST: 10 U/L — ABNORMAL LOW (ref 15–41)
Albumin: 3.8 g/dL (ref 3.5–5.0)
Alkaline Phosphatase: 71 U/L (ref 38–126)
Anion gap: 5 (ref 5–15)
BUN: 20 mg/dL (ref 8–23)
CO2: 28 mmol/L (ref 22–32)
Calcium: 9.3 mg/dL (ref 8.9–10.3)
Chloride: 103 mmol/L (ref 98–111)
Creatinine: 0.98 mg/dL (ref 0.61–1.24)
GFR, Estimated: >60 mL/min (ref >60)
Glucose, Bld: 118 mg/dL — ABNORMAL HIGH (ref 70–99)
Potassium: 4.1 mmol/L (ref 3.5–5.1)
Sodium: 136 mmol/L (ref 135–145)
Total Bilirubin: 0.3 mg/dL (ref 0.3–1.2)
Total Protein: 7.8 g/dL (ref 6.5–8.1)

## 2021-12-16 NOTE — Progress Notes (Signed)
START ON PATHWAY REGIMEN - Non-Small Cell Lung     A cycle is every 7 days, concurrent with RT:     Paclitaxel      Carboplatin   **Always confirm dose/schedule in your pharmacy ordering system**  Patient Characteristics: Preoperative or Nonsurgical Candidate (Clinical Staging), Stage III - Nonsurgical Candidate (Nonsquamous and Squamous), PS = 0, 1 Therapeutic Status: Preoperative or Nonsurgical Candidate (Clinical Staging) AJCC T Category: cT3 AJCC N Category: cN1 AJCC M Category: cM0 AJCC 8 Stage Grouping: IIIA ECOG Performance Status: 1 Intent of Therapy: Curative Intent, Discussed with Patient

## 2021-12-16 NOTE — Progress Notes (Signed)
[place] [phone]   [phone]  CONSULT NOTE  REFERRING PHYSICIAN: Dr. Leory Plowman Icard  REASON FOR CONSULTATION:  71 years old white male recently diagnosed with lung cancer.  HPI Cameron Roman is a 71 y.o. male with past medical history significant for atrial fibrillation, osteoarthritis, anxiety, chronic neck pain, diverticulosis, GERD, hepatitis C, hiatal hernia, stroke, type 2 diabetes as well as long history of smoking.  In June 2023 the patient was complaining of shortness of breath, mid back pain and weight loss.  He was seen at [place] and chest x-ray performed at that time showed spiculated mass present in the posterior chest likely in the superior segment of the left lower lobe.  This was followed by CT scan of the chest on 09/06/2021 and it showed 5.2 cm mass in the superior segment of the left lower lobe which extends all the way to the left hilum.  There was no associated mediastinal or hilar adenopathy.  The patient was referred to Dr. Valeta Harms and repeat CT super D of the chest on 11/03/2021 showed left lower lobe mass inseparable from the left posterior hilum extending into the superior segment of the left lower lobe and abutting the aorta measuring 6.7 x 4.2 cm.  The lesion also distort the major fissure adjacent to the left hilum with the mass likely invades extrapleural fat adjacent to the aorta.  Satellite nodules were demonstrated including a 0.5 cm nodule in the superior segment of the left lower lobe and 0.8 cm nodule in the superior segment of the left lower lobe.  The scan also showed left hilar nodal enlargement measuring 1.8 cm.  The subcarinal nodal enlargement is mild but suspicious given the above findings and no adenopathy by size criteria in the contralateral mediastinum or thoracic inlet and no sign of pleural effusion.  On 11/30/2021 the patient underwent video bronchoscopy with endobronchial ultrasound procedure under the care  of Dr. Valeta Harms.  The final pathology ([id number]) showed malignant cells consistent with squamous cell carcinoma. The patient was referred to me today for evaluation and recommendation regarding treatment of his condition. When seen today he is feeling fine except for gassy stomach and indigestion.  He is supposed to see Dr. Henrene Pastor on December 30, 2021 for evaluation of his abdominal issues.  He has no more weight loss.  He denied having any current chest pain, shortness of breath, cough or hemoptysis.  He has no nausea, vomiting but occasional upset stomach with no constipation.  He has no headache or visual changes. Family history significant for father died with questionable sarcoma or skin cancer.  Mother died from GI bleed and brother had melanoma. The patient is a widow and his wife was one of my patient and died from extensive stage small cell lung cancer.  He has 2 children a son and daughter as well as 6 grandchildren and great-grandchildren.  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  He has a history of smoking 1 pack/day for around 30 years but quit 25 years ago.  He also has a history of alcohol abuse in the past but not recently and no history of drug abuse.  HPI  Past Medical History:  Diagnosis Date   Anxiety    Arthritis    Atrial fibrillation (HCC)    Chronic back pain    buldging disc   Chronic neck pain    ruptured disc   Cough    hx smoking   Diverticulosis  Esophageal stricture    GERD (gastroesophageal reflux disease)    takes Prilosec daily   H/O hiatal hernia    Hearing loss    Hepatitis-C 2017   treated and cured per patient   Hiatal hernia    History of colon polyps    Insomnia    takes Elavil nightly   Lipoma of abdominal wall 02/11/2011   Stroke ([place])    Type 2 diabetes mellitus ([place]) 07/22/2020   patient denies DM, per MD note pt is pre-diabetic    Past Surgical History:  Procedure Laterality Date   ANTERIOR CERVICAL DECOMP/DISCECTOMY FUSION   04/12/2011   Procedure: ANTERIOR CERVICAL DECOMPRESSION/DISCECTOMY FUSION 2 LEVELS;  Surgeon: Peggyann Shoals, MD;  Location: [facility];  Service: Neurosurgery;  Laterality: N/A;  exploration of Cervical four - seven  fusion with redo Cervical six- seven, cervical three- four anterior cervical decompression with fusion interbody prothesis plating and bonegraft and C34 anterior cervical decompression with inte   APPENDECTOMY     at age 67   BRONCHIAL BIOPSY  11/30/2021   Procedure: BRONCHIAL BIOPSIES;  Surgeon: Garner Nash, DO;  Location: [facility];  Service: Pulmonary;;   BRONCHIAL BRUSHINGS  11/30/2021   Procedure: BRONCHIAL BRUSHINGS;  Surgeon: Garner Nash, DO;  Location: [facility];  Service: Pulmonary;;   CARDIAC CATHETERIZATION  06/03/04   COLONOSCOPY     EP IMPLANTABLE DEVICE N/A 09/14/2015   Procedure: Loop Recorder Insertion;  Surgeon: Evans Lance, MD;  Location: [facility];  Service: Cardiovascular;  Laterality: N/A;   FINE NEEDLE ASPIRATION  11/30/2021   Procedure: FINE NEEDLE ASPIRATION (FNA) LINEAR;  Surgeon: Garner Nash, DO;  Location: [facility];  Service: Pulmonary;;   HAND SURGERY Right 2005   right   INNER EAR SURGERY Bilateral    bil;titanium in both ears   lower back surgery  2009   had plates and screws inserted   NECK SURGERY  2009   had insertion of  plates and screws   TEE WITHOUT CARDIOVERSION N/A 09/14/2015   Procedure: TRANSESOPHAGEAL ECHOCARDIOGRAM (TEE);  Surgeon: Thayer Headings, MD;  Location: [facility];  Service: Cardiovascular;  Laterality: N/A;   TRANSFORAMINAL LUMBAR INTERBODY FUSION (TLIF) WITH PEDICLE SCREW FIXATION 1 LEVEL Left 09/27/2019   Procedure: Left Lumbar 5 Sacral 1 Transforaminal lumbar interbody fusion with exploration/removal of adjacent level hardware;  Surgeon: Erline Levine, MD;  Location: [facility];  Service: Neurosurgery;  Laterality: Left;  3C/RM 19   VIDEO BRONCHOSCOPY WITH ENDOBRONCHIAL ULTRASOUND N/A 11/30/2021    Procedure: VIDEO BRONCHOSCOPY WITH ENDOBRONCHIAL ULTRASOUND;  Surgeon: Garner Nash, DO;  Location: [facility];  Service: Pulmonary;  Laterality: N/A;    Family History  Problem Relation Age of Onset   Colon cancer Father    Other Mother        Perforated bowel   Colon cancer Maternal Grandmother    Colon cancer Maternal Grandfather    Heart disease Maternal Uncle    Stroke Maternal Uncle    Cancer Brother    Healthy Son    Healthy Daughter    Anesthesia problems Neg Hx    Hypotension Neg Hx    Malignant hyperthermia Neg Hx    Pseudochol deficiency Neg Hx     Social History Social History   Tobacco Use   Smoking status: Former    Packs/day: 1.00    Years: 41.00    Total pack years: 41.00    Types: Cigarettes  Quit date: 06/29/2002    Years since quitting: 19.4   Smokeless tobacco: Never   Tobacco comments:    Pt quit smoking in 2004. 10/21/21  Vaping Use   Vaping Use: Never used  Substance Use Topics   Alcohol use: Not Currently    Alcohol/week: 1.0 standard drink of alcohol    Types: 1 Cans of beer per week   Drug use: No    Allergies  Allergen Reactions   Percocet [Oxycodone-Acetaminophen] Itching    Current Outpatient Medications  Medication Sig Dispense Refill   ELIQUIS 5 MG TABS tablet TAKE ONE TABLET BY MOUTH TWICE DAILY 60 tablet 11   fluticasone (FLONASE) 50 MCG/ACT nasal spray Place 1 spray into the nose in the morning.     gabapentin (NEURONTIN) 300 MG capsule Take 1 capsule (300 mg total) by mouth 3 (three) times daily. Take one capsule twice to three times daily (Patient taking differently: Take 300 mg by mouth 2 (two) times daily.) 270 capsule 3   HYDROcodone-acetaminophen (NORCO) 10-325 MG tablet Take 1 tablet by mouth 3 (three) times daily. 30 tablet 0   Omega-3 Fatty Acids (FISH OIL) 1000 MG CAPS Take 1,000 mg by mouth in the morning and at bedtime.      omeprazole (PRILOSEC) 20 MG capsule Take 1 capsule (20 mg total) by mouth daily. 90  capsule 3   pravastatin (PRAVACHOL) 20 MG tablet Take 1 tablet (20 mg total) by mouth daily. Take one tablet once daily (Patient taking differently: Take 20 mg by mouth at bedtime. Take one tablet once daily) 90 tablet 3   tamsulosin (FLOMAX) 0.4 MG CAPS capsule Take 1 capsule (0.4 mg total) by mouth daily. 90 capsule 3   Current Facility-Administered Medications  Medication Dose Route Frequency Provider Last Rate Last Admin   methylPREDNISolone acetate (DEPO-MEDROL) injection 80 mg  80 mg Intramuscular Once Dettinger, [name], MD        Review of Systems  Constitutional: positive for fatigue Eyes: negative Ears, nose, mouth, throat, and face: negative Respiratory: negative Cardiovascular: negative Gastrointestinal: positive for change in bowel habits Genitourinary:negative Integument/breast: negative Hematologic/lymphatic: negative Musculoskeletal:positive for back pain Neurological: negative Behavioral/Psych: negative Endocrine: negative Allergic/Immunologic: negative  Physical Exam  EQA:STMHD, healthy, no distress, well nourished, and well developed SKIN: skin color, texture, turgor are normal, no rashes or significant lesions HEAD: Normocephalic, No masses, lesions, tenderness or abnormalities EYES: normal, PERRLA, Conjunctiva are pink and non-injected EARS: External ears normal, Canals clear OROPHARYNX:no exudate, no erythema, and lips, buccal mucosa, and tongue normal  NECK: supple, no adenopathy, no JVD LYMPH:  no palpable lymphadenopathy, no hepatosplenomegaly LUNGS: clear to auscultation , and palpation HEART: regular rate & rhythm, no murmurs, and no gallops ABDOMEN:abdomen soft, non-tender, normal bowel sounds, and no masses or organomegaly BACK: Back symmetric, no curvature., No CVA tenderness EXTREMITIES:no joint deformities, effusion, or inflammation, no edema  NEURO: alert & oriented x 3 with fluent speech, no focal motor/sensory deficits  PERFORMANCE  STATUS: ECOG 1  LABORATORY DATA: Lab Results  Component Value Date   WBC 13.1 (H) 12/16/2021   HGB 11.0 (L) 12/16/2021   HCT 31.7 (L) 12/16/2021   MCV 93.2 12/16/2021   PLT 348 12/16/2021      Chemistry      Component Value Date/Time   NA 140 08/30/2021 1659   K 4.4 08/30/2021 1659   CL 106 08/30/2021 1659   CO2 22 08/30/2021 1659   BUN 17 08/30/2021 1659   CREATININE 0.93 08/30/2021  1659   CREATININE 0.80 08/30/2012 0955      Component Value Date/Time   CALCIUM 9.1 08/30/2021 1659   ALKPHOS 93 08/30/2021 1659   AST 11 08/30/2021 1659   ALT 6 08/30/2021 1659   BILITOT <0.2 08/30/2021 1659       RADIOGRAPHIC STUDIES: No results found.  ASSESSMENT: This is a very pleasant 71 years old white male recently diagnosed with at least stage IIIA (T3, N1, M0) non-small cell lung cancer, squamous cell carcinoma presented with large left lower lobe lung mass in addition to left hilar and suspicious subcarinal lymphadenopathy diagnosed in September 2023, pending staging work-up.     PLAN: I had a lengthy discussion with the patient today about his current disease stage, prognosis and treatment options. I personally and independently reviewed the scan images and discussed results with the patient today. I recommended for the patient to complete the staging work-up by ordering a PET scan as well as MRI of the brain to rule out any other metastatic disease. If the patient has no evidence of metastatic disease, he will be treated with a course of concurrent chemoradiation with weekly carboplatin for AUC of 2 and paclitaxel 45 Mg/M2 for 6-7 weeks followed by consolidation immunotherapy if he has no evidence for disease progression after the induction phase.  This is a potentially curable condition if the patient has no metastatic disease. I discussed with the patient the adverse effect of this treatment including but not limited to alopecia, myelosuppression, nausea and vomiting, peripheral  neuropathy, liver or renal dysfunction. He is expected to start the first cycle of this treatment on December 27, 2021. The patient will come back for follow-up visit with the first day of his treatment. He will have a chemotherapy education class before the first dose of treatment. The patient will have Compazine 10 mg p.o. every 6 hours as needed for nausea sent to his pharmacy.  The patient voices understanding of current disease status and treatment options and is in agreement with the current care plan.  All questions were answered. The patient knows to call the clinic with any problems, questions or concerns. We can certainly see the patient much sooner if necessary.  Thank you so much for allowing me to participate in the care of Quinlin Conant. I will continue to follow up the patient with you and assist in his care.  The total time spent in the appointment was 90 minutes.  Disclaimer: This note was dictated with voice recognition software. Similar sounding words can inadvertently be transcribed and may not be corrected upon review.   Eilleen Kempf December 16, 2021, 1:52 PM

## 2021-12-17 ENCOUNTER — Other Ambulatory Visit: Payer: Self-pay

## 2021-12-18 ENCOUNTER — Other Ambulatory Visit: Payer: Self-pay

## 2021-12-19 ENCOUNTER — Other Ambulatory Visit: Payer: Self-pay

## 2021-12-20 ENCOUNTER — Other Ambulatory Visit: Payer: Self-pay

## 2021-12-20 ENCOUNTER — Telehealth: Payer: Self-pay | Admitting: Internal Medicine

## 2021-12-20 NOTE — Progress Notes (Signed)
The pharmacy team has substituted IV diphenhydramine for IV cetirizine as a premedication. Patient will be monitored for hypersensitivity reaction and adverse reactions to IV cetirizine. Thanks.    Larene Beach, PharmD

## 2021-12-20 NOTE — Telephone Encounter (Signed)
Scheduled per 10/05 los, patient has been called and notified of upcoming appointments.

## 2021-12-20 NOTE — Progress Notes (Signed)
Pharmacist Chemotherapy Monitoring - Initial Assessment    Anticipated start date: 12/27/21   The following has been reviewed per standard work regarding the patient's treatment regimen: The patient's diagnosis, treatment plan and drug doses, and organ/hematologic function Lab orders and baseline tests specific to treatment regimen  The treatment plan start date, drug sequencing, and pre-medications Prior authorization status  Patient's documented medication list, including drug-drug interaction screen and prescriptions for anti-emetics and supportive care specific to the treatment regimen The drug concentrations, fluid compatibility, administration routes, and timing of the medications to be used The patient's access for treatment and lifetime cumulative dose history, if applicable  The patient's medication allergies and previous infusion related reactions, if applicable   Changes made to treatment plan:  N/A  Follow up needed:  Pending authorization for treatment    Larene Beach, [place], 12/20/2021  2:27 PM

## 2021-12-21 ENCOUNTER — Other Ambulatory Visit: Payer: Self-pay | Admitting: Internal Medicine

## 2021-12-21 ENCOUNTER — Telehealth: Payer: Self-pay | Admitting: Radiation Oncology

## 2021-12-21 NOTE — Telephone Encounter (Signed)
Called patient to schedule a consultation w. Dr. Sondra Come. No answer, LVM for a return call.

## 2021-12-21 NOTE — Telephone Encounter (Signed)
Prescription refill request for Eliquis received. Indication: PAF Last office visit: 06/15/21  Beckie Salts MD Scr: 0.98 on 12/16/21 Age:  71 Weight: 66.6kg  Based on above findings Eliquis 5mg  twice daily is the appropriate dose.  Refill approved.

## 2021-12-22 ENCOUNTER — Telehealth: Payer: Self-pay | Admitting: Medical Oncology

## 2021-12-22 NOTE — Telephone Encounter (Signed)
Confirmed appts for next week

## 2021-12-23 ENCOUNTER — Other Ambulatory Visit: Payer: Self-pay

## 2021-12-23 DIAGNOSIS — C3490 Malignant neoplasm of unspecified part of unspecified bronchus or lung: Secondary | ICD-10-CM

## 2021-12-23 IMAGING — US US ABDOMEN LIMITED
1 series · 14 of 25 positions shown · non-contrast
Comparison: 01/25/2019

CLINICAL DATA: Cirrhosis.  Question of ascites.

EXAM:
ULTRASOUND ABDOMEN LIMITED RIGHT UPPER QUADRANT

[Series 1: us abdomen limited · 14 of 46 slices shown]
[im 1/46]
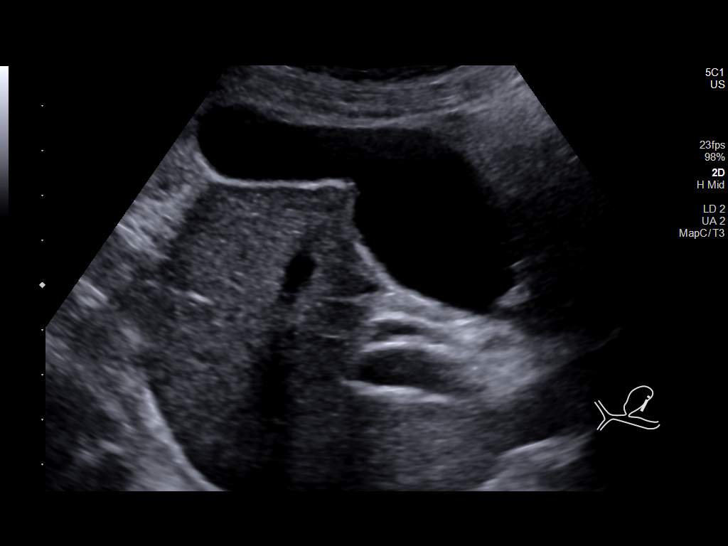
[im 4/46]
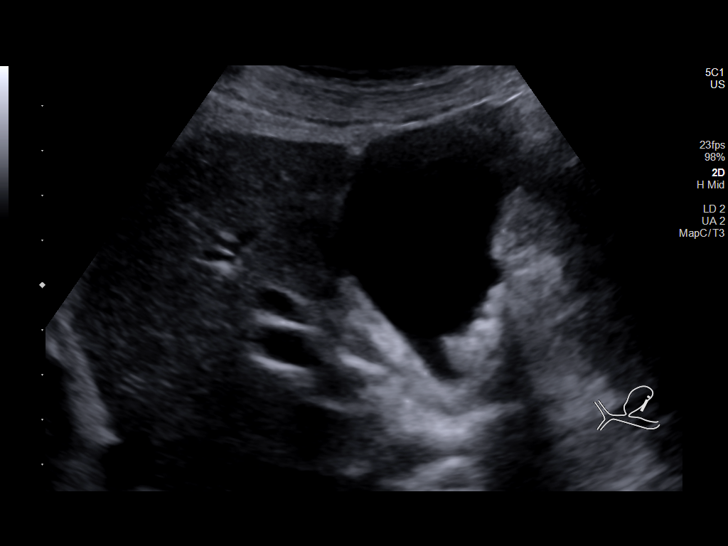
[im 8/46]
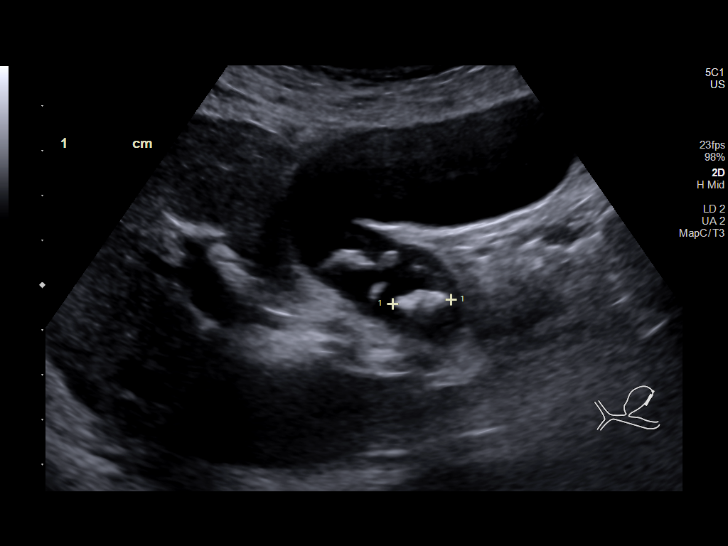
[im 12/46]
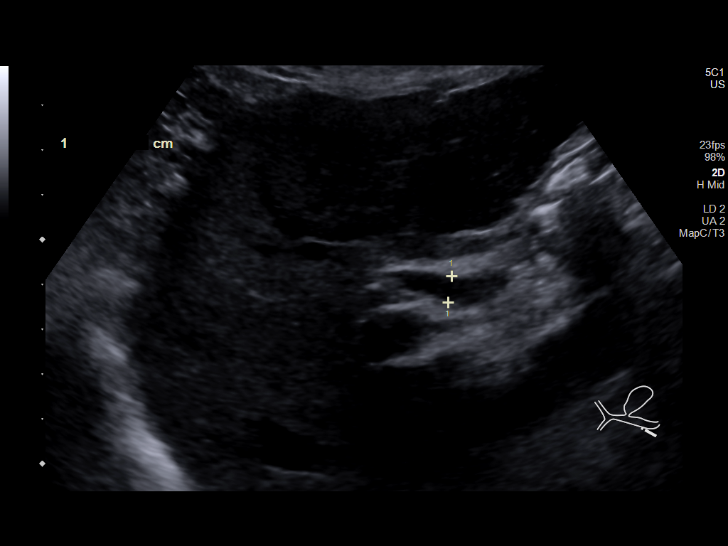
[im 16/46]
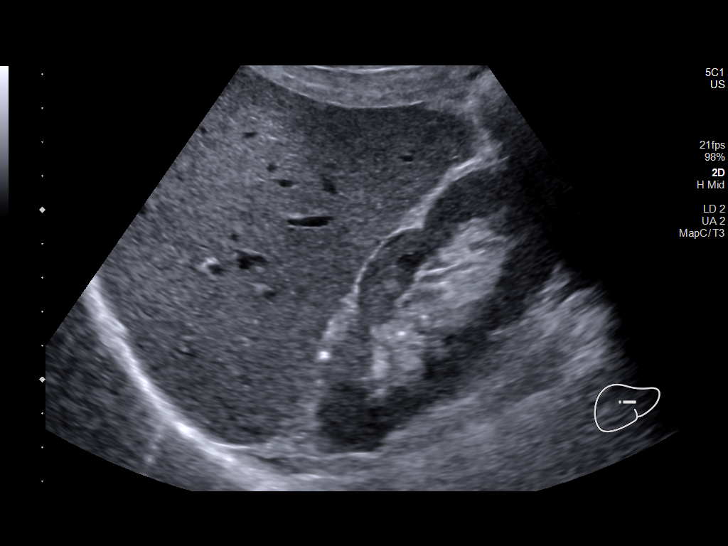
[im 17/46]
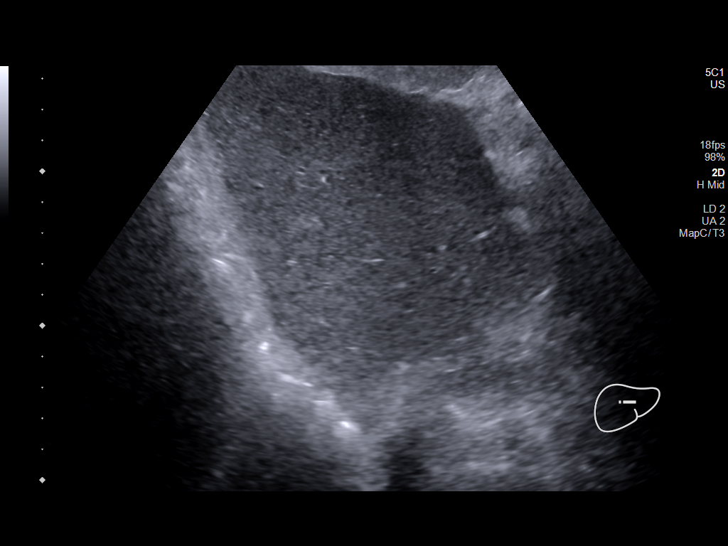
[im 21/46]
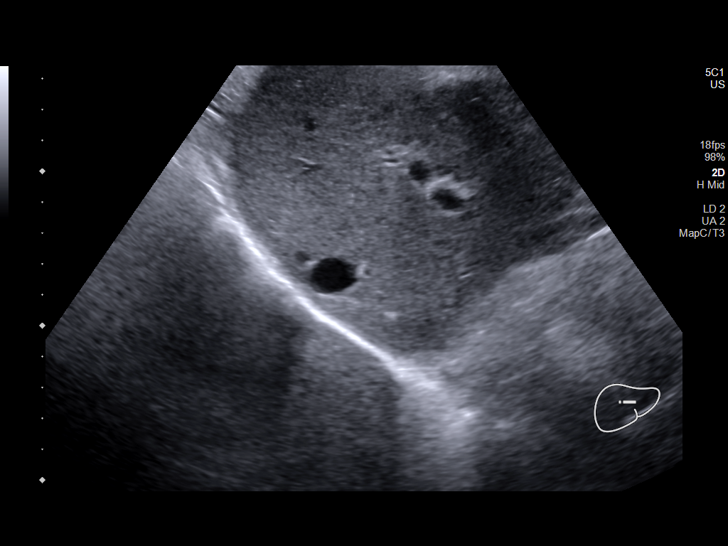
[im 25/46]
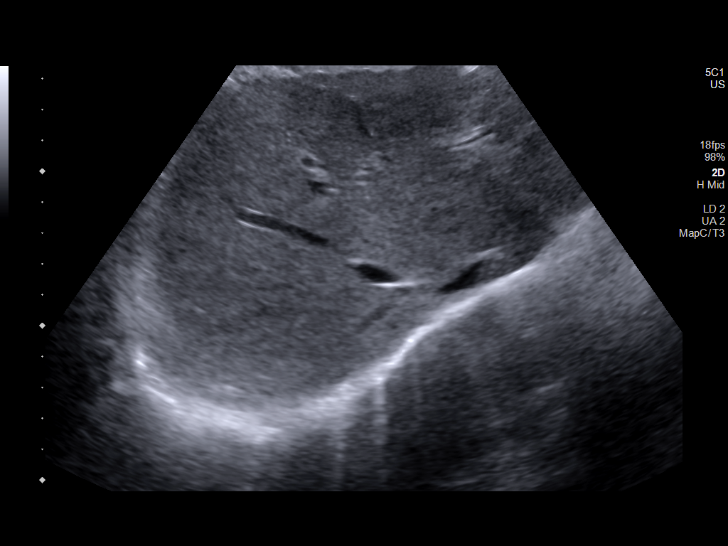
[im 29/46]
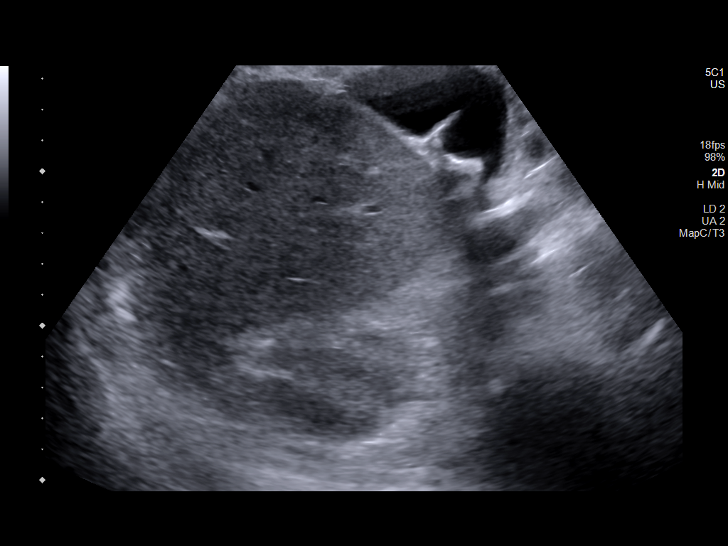
[im 31/46]
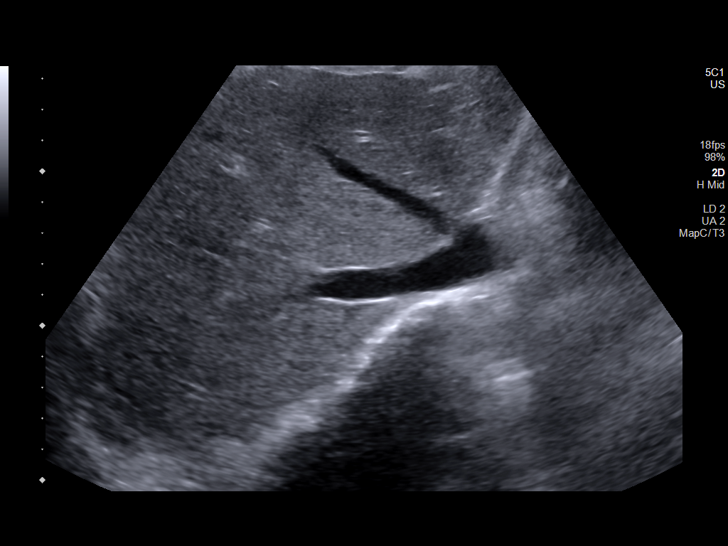
[im 34/46]
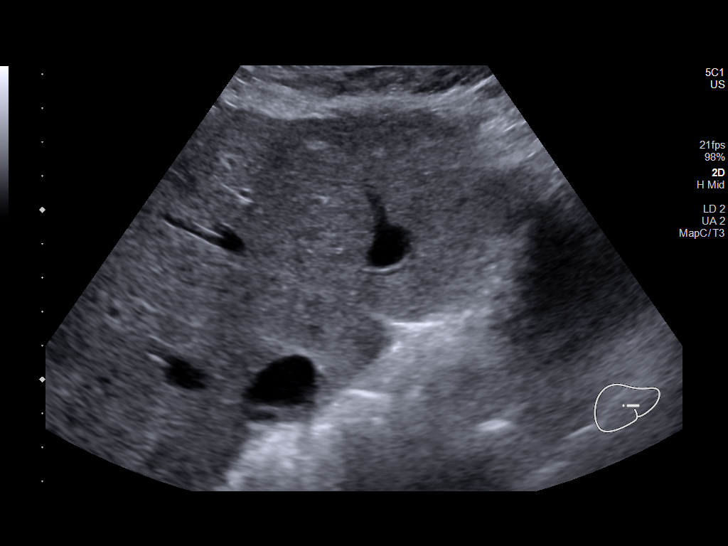
[im 38/46]
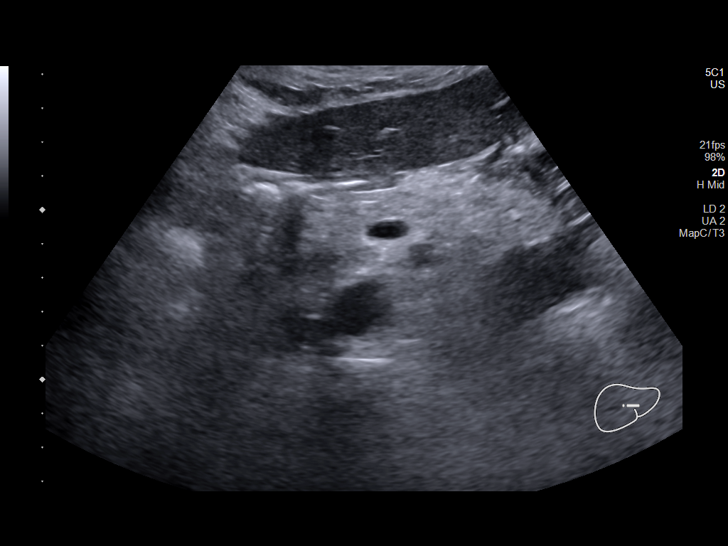
[im 42/46]
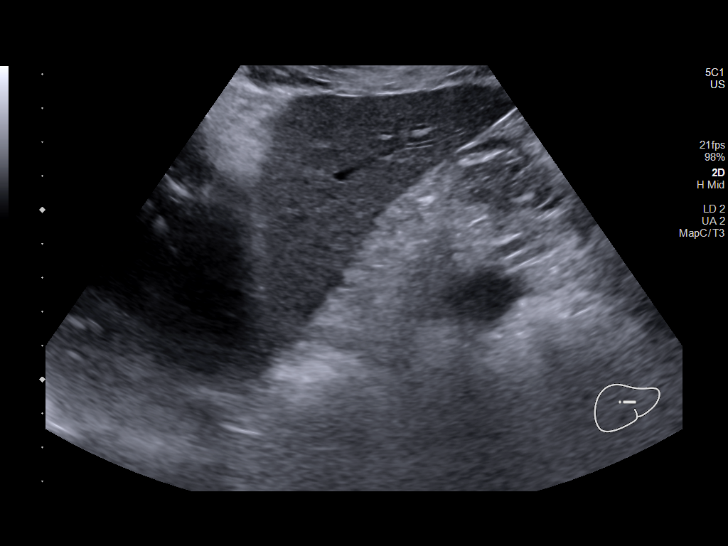
[im 46/46]
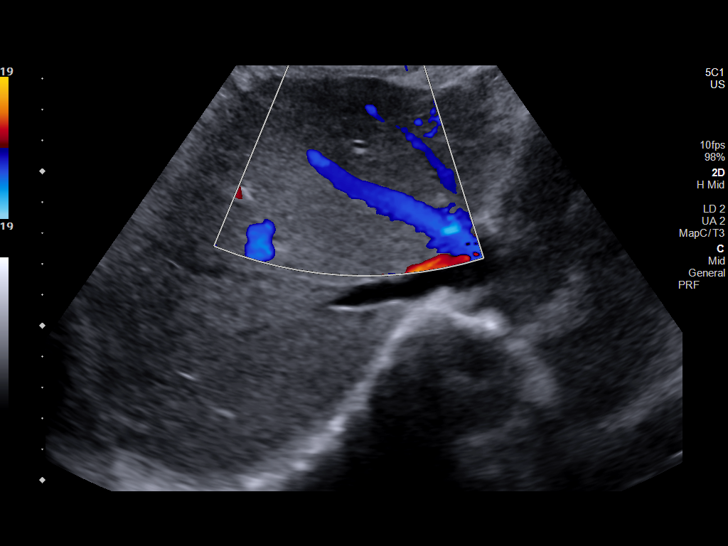

[14 of 25 positions shown; findings below may reference images not displayed]

FINDINGS: Gallbladder:

Gallbladder wall is normal in thickness, 1.6 millimeters. Numerous
stones are identified within the gallbladder, measuring on the order
of 1.3 centimeters. No pericholecystic fluid or sonographic Murphy's
sign.

Common bile duct:

Diameter: 6.0 millimeters

Liver:

Liver is mildly echogenic and nodular, consistent with cirrhosis. No
focal liver lesion identified. Portal vein is patent on color
Doppler imaging with normal direction of blood flow towards the
liver.

Other: No ascites identified.
IMPRESSION: 1. Cholelithiasis without other evidence for acute cholecystitis.
2. Cirrhotic contour of the liver.
3. Mild echogenicity of the liver without focal lesion.
4. No ascites detected.

## 2021-12-23 MED ORDER — PROCHLORPERAZINE MALEATE 10 MG PO TABS: 10.0000 mg | ORAL_TABLET | Freq: Four times a day (QID) | ORAL | 1 refills | Status: DC | PRN

## 2021-12-24 ENCOUNTER — Other Ambulatory Visit: Payer: Self-pay

## 2021-12-24 ENCOUNTER — Inpatient Hospital Stay: Payer: Medicare Other

## 2021-12-24 ENCOUNTER — Ambulatory Visit (HOSPITAL_COMMUNITY)
Admission: RE | Admit: 2021-12-24 | Discharge: 2021-12-24 | Disposition: A | Discharge status: Home / Self Care | Payer: Medicare Other | Source: Ambulatory Visit | Attending: Internal Medicine | Admitting: Internal Medicine

## 2021-12-24 DIAGNOSIS — C3490 Malignant neoplasm of unspecified part of unspecified bronchus or lung: Secondary | ICD-10-CM

## 2021-12-24 DIAGNOSIS — G319 Degenerative disease of nervous system, unspecified: Secondary | ICD-10-CM | POA: Diagnosis not present

## 2021-12-24 MED ORDER — GADOBUTROL 1 MMOL/ML IV SOLN
6.0000 mL | Freq: Once | INTRAVENOUS | Status: AC | PRN
  Administered 2021-12-24: 6 mL via INTRAVENOUS

## 2021-12-24 MED FILL — Dexamethasone Sodium Phosphate Inj 100 MG/10ML: INTRAMUSCULAR | Qty: 1 | Status: AC

## 2021-12-25 NOTE — Progress Notes (Deleted)
[facility] OFFICE PROGRESS NOTE  Dettinger, [name], MD [place] [address]  DIAGNOSIS: Stage *** IIIA (T3, N1, M0) non-small cell lung cancer, squamous cell carcinoma presented with large left lower lobe lung mass in addition to left hilar and suspicious subcarinal lymphadenopathy diagnosed in September 2023, pending staging work-up.    PRIOR THERAPY: None  CURRENT THERAPY: Concurrent chemo/radiation with carboplatin for an AUC of 2 and paclitaxel 45 mg/m2 weekly. First dose expected ***  INTERVAL HISTORY: Nikko Goldwire 71 y.o. male returns to the clinic today for follow-up visit.  The patient was recently diagnosed with lung cancer.  He is scheduled for his staging PET scan on 12/30/2021.  He is scheduled to see Dr. Sondra Come on 10/23/thousand 23 to possibly begin concurrent chemoradiation.  Today the patient is feeling fairly well without any concerning complaints.  Denies any fever, chills, night sweats, or unexplained weight loss.  Denies any chest pain, shortness of breath, cough, or hemoptysis.  Denies any nausea, vomiting, diarrhea, or constipation.  Dr. Henrene Pastor for indigestion on 10/19?  Denies any headache or visual changes.  He recently had a staging brain MRI.  He is here today for evaluation and for more detailed discussion about his current condition and recommended treatment options. MEDICAL HISTORY: Past Medical History:  Diagnosis Date   Anxiety    Arthritis    Atrial fibrillation (HCC)    Chronic back pain    buldging disc   Chronic neck pain    ruptured disc   Cough    hx smoking   Diverticulosis    Esophageal stricture    GERD (gastroesophageal reflux disease)    takes Prilosec daily   H/O hiatal hernia    Hearing loss    Hepatitis-C 2017   treated and cured per patient   Hiatal hernia    History of colon polyps    Insomnia    takes Elavil nightly   Lipoma of abdominal wall 02/11/2011   Stroke ([place])    Type 2 diabetes mellitus ([place])  07/22/2020   patient denies DM, per MD note pt is pre-diabetic    ALLERGIES:  is allergic to percocet [oxycodone-acetaminophen].  MEDICATIONS:  Current Outpatient Medications  Medication Sig Dispense Refill   apixaban (ELIQUIS) 5 MG TABS tablet TAKE ONE TABLET BY MOUTH TWICE DAILY 60 tablet 5   fluticasone (FLONASE) 50 MCG/ACT nasal spray Place 1 spray into the nose in the morning.     gabapentin (NEURONTIN) 300 MG capsule Take 1 capsule (300 mg total) by mouth 3 (three) times daily. Take one capsule twice to three times daily (Patient taking differently: Take 300 mg by mouth 2 (two) times daily.) 270 capsule 3   HYDROcodone-acetaminophen (NORCO) 10-325 MG tablet Take 1 tablet by mouth 3 (three) times daily. 30 tablet 0   Omega-3 Fatty Acids (FISH OIL) 1000 MG CAPS Take 1,000 mg by mouth in the morning and at bedtime.      omeprazole (PRILOSEC) 20 MG capsule Take 1 capsule (20 mg total) by mouth daily. 90 capsule 3   pravastatin (PRAVACHOL) 20 MG tablet Take 1 tablet (20 mg total) by mouth daily. Take one tablet once daily (Patient taking differently: Take 20 mg by mouth at bedtime. Take one tablet once daily) 90 tablet 3   prochlorperazine (COMPAZINE) 10 MG tablet Take 1 tablet (10 mg total) by mouth every 6 (six) hours as needed for nausea or vomiting. 30 tablet 1   tamsulosin (FLOMAX)  0.4 MG CAPS capsule Take 1 capsule (0.4 mg total) by mouth daily. 90 capsule 3   Current Facility-Administered Medications  Medication Dose Route Frequency Provider Last Rate Last Admin   methylPREDNISolone acetate (DEPO-MEDROL) injection 80 mg  80 mg Intramuscular Once Dettinger, [name], MD        SURGICAL HISTORY:  Past Surgical History:  Procedure Laterality Date   ANTERIOR CERVICAL DECOMP/DISCECTOMY FUSION  04/12/2011   Procedure: ANTERIOR CERVICAL DECOMPRESSION/DISCECTOMY FUSION 2 LEVELS;  Surgeon: Peggyann Shoals, MD;  Location: [facility];  Service: Neurosurgery;  Laterality: N/A;  exploration of  Cervical four - seven  fusion with redo Cervical six- seven, cervical three- four anterior cervical decompression with fusion interbody prothesis plating and bonegraft and C34 anterior cervical decompression with inte   APPENDECTOMY     at age 47   BRONCHIAL BIOPSY  11/30/2021   Procedure: BRONCHIAL BIOPSIES;  Surgeon: Garner Nash, DO;  Location: [facility];  Service: Pulmonary;;   BRONCHIAL BRUSHINGS  11/30/2021   Procedure: BRONCHIAL BRUSHINGS;  Surgeon: Garner Nash, DO;  Location: [facility];  Service: Pulmonary;;   CARDIAC CATHETERIZATION  06/03/04   COLONOSCOPY     EP IMPLANTABLE DEVICE N/A 09/14/2015   Procedure: Loop Recorder Insertion;  Surgeon: Evans Lance, MD;  Location: [facility];  Service: Cardiovascular;  Laterality: N/A;   FINE NEEDLE ASPIRATION  11/30/2021   Procedure: FINE NEEDLE ASPIRATION (FNA) LINEAR;  Surgeon: Garner Nash, DO;  Location: [facility];  Service: Pulmonary;;   HAND SURGERY Right 2005   right   INNER EAR SURGERY Bilateral    bil;titanium in both ears   lower back surgery  2009   had plates and screws inserted   NECK SURGERY  2009   had insertion of  plates and screws   TEE WITHOUT CARDIOVERSION N/A 09/14/2015   Procedure: TRANSESOPHAGEAL ECHOCARDIOGRAM (TEE);  Surgeon: Thayer Headings, MD;  Location: [facility];  Service: Cardiovascular;  Laterality: N/A;   TRANSFORAMINAL LUMBAR INTERBODY FUSION (TLIF) WITH PEDICLE SCREW FIXATION 1 LEVEL Left 09/27/2019   Procedure: Left Lumbar 5 Sacral 1 Transforaminal lumbar interbody fusion with exploration/removal of adjacent level hardware;  Surgeon: Erline Levine, MD;  Location: [facility];  Service: Neurosurgery;  Laterality: Left;  3C/RM 19   VIDEO BRONCHOSCOPY WITH ENDOBRONCHIAL ULTRASOUND N/A 11/30/2021   Procedure: VIDEO BRONCHOSCOPY WITH ENDOBRONCHIAL ULTRASOUND;  Surgeon: Garner Nash, DO;  Location: [facility];  Service: Pulmonary;  Laterality: N/A;    REVIEW OF SYSTEMS:   Review of  Systems  Constitutional: Negative for appetite change, chills, fatigue, fever and unexpected weight change.  HENT:   Negative for mouth sores, nosebleeds, sore throat and trouble swallowing.   Eyes: Negative for eye problems and icterus.  Respiratory: Negative for cough, hemoptysis, shortness of breath and wheezing.   Cardiovascular: Negative for chest pain and leg swelling.  Gastrointestinal: Negative for abdominal pain, constipation, diarrhea, nausea and vomiting.  Genitourinary: Negative for bladder incontinence, difficulty urinating, dysuria, frequency and hematuria.   Musculoskeletal: Negative for back pain, gait problem, neck pain and neck stiffness.  Skin: Negative for itching and rash.  Neurological: Negative for dizziness, extremity weakness, gait problem, headaches, light-headedness and seizures.  Hematological: Negative for adenopathy. Does not bruise/bleed easily.  Psychiatric/Behavioral: Negative for confusion, depression and sleep disturbance. The patient is not nervous/anxious.     PHYSICAL EXAMINATION:  There were no vitals taken for this visit.  ECOG PERFORMANCE STATUS: {CHL ONC ECOG [id number]  Physical  Exam  Constitutional: Oriented to person, place, and time and well-developed, well-nourished, and in no distress. No distress.  HENT:  Head: Normocephalic and atraumatic.  Mouth/Throat: Oropharynx is clear and moist. No oropharyngeal exudate.  Eyes: Conjunctivae are normal. Right eye exhibits no discharge. Left eye exhibits no discharge. No scleral icterus.  Neck: Normal range of motion. Neck supple.  Cardiovascular: Normal rate, regular rhythm, normal heart sounds and intact distal pulses.   Pulmonary/Chest: Effort normal and breath sounds normal. No respiratory distress. No wheezes. No rales.  Abdominal: Soft. Bowel sounds are normal. Exhibits no distension and no mass. There is no tenderness.  Musculoskeletal: Normal range of motion. Exhibits no edema.   Lymphadenopathy:    No cervical adenopathy.  Neurological: Alert and oriented to person, place, and time. Exhibits normal muscle tone. Gait normal. Coordination normal.  Skin: Skin is warm and dry. No rash noted. Not diaphoretic. No erythema. No pallor.  Psychiatric: Mood, memory and judgment normal.  Vitals reviewed.  LABORATORY DATA: Lab Results  Component Value Date   WBC 13.1 (H) 12/16/2021   HGB 11.0 (L) 12/16/2021   HCT 31.7 (L) 12/16/2021   MCV 93.2 12/16/2021   PLT 348 12/16/2021      Chemistry      Component Value Date/Time   NA 136 12/16/2021 1331   NA 140 08/30/2021 1659   K 4.1 12/16/2021 1331   CL 103 12/16/2021 1331   CO2 28 12/16/2021 1331   BUN 20 12/16/2021 1331   BUN 17 08/30/2021 1659   CREATININE 0.98 12/16/2021 1331   CREATININE 0.80 08/30/2012 0955      Component Value Date/Time   CALCIUM 9.3 12/16/2021 1331   ALKPHOS 71 12/16/2021 1331   AST 10 (L) 12/16/2021 1331   ALT 5 12/16/2021 1331   BILITOT 0.3 12/16/2021 1331       RADIOGRAPHIC STUDIES:  MR BRAIN W WO CONTRAST  Result Date: 12/24/2021 CLINICAL DATA:  Provided history: Malignant neoplasm of unspecified part of unspecified bronchus or lung. Non-small cell lung cancer, staging. EXAM: MRI HEAD WITHOUT AND WITH CONTRAST TECHNIQUE: Multiplanar, multiecho pulse sequences of the brain and surrounding structures were obtained without and with intravenous contrast. CONTRAST:  75mL GADAVIST GADOBUTROL 1 MMOL/ML IV SOLN COMPARISON:  Brain MRI 09/11/2015. MRA head 09/11/2015. FINDINGS: Brain: Mild generalized cerebral atrophy. Redemonstrated small chronic cortically-based infarcts within the left parietal and occipital lobes (left PCA vascular territory). A small chronic cortical infarct within the right occipital lobe (right PCA vascular territory) was better appreciated on the prior brain MRI of 09/11/2015. Mild multifocal T2 FLAIR hyperintense signal abnormality within the cerebral white matter,  nonspecific but compatible with chronic small vessel ischemic disease. Chronic lacunar infarct versus prominent perivascular space within the medial left thalamus, new from the prior MRI. Tiny chronic infarct within the right cerebellar hemisphere. There is no acute infarct. No evidence of an intracranial mass. No chronic intracranial blood products. No extra-axial fluid collection. No midline shift. No pathologic intracranial enhancement identified. Vascular: Maintained flow voids within the proximal large arterial vessels. Skull and upper cervical spine: No focal suspicious marrow lesion. Susceptibility artifact arising from ACDF hardware. Sinuses/Orbits: No mass or acute finding within the imaged orbits. Minimal mucosal thickening within the bilateral ethmoid and sphenoid sinuses. Other: Bilateral mastoid effusions (greater on the right). IMPRESSION: 1. No evidence of intracranial metastatic disease. 2. Known small chronic cortically-based infarcts within the bilateral PCA vascular territories. 3. Mild chronic small vessel ischemic changes within the cerebral white matter,  slightly progressed from the prior brain MRI of 09/11/2015 4. Chronic lacunar infarct versus prominent perivascular space within the medial left thalamus, new from the prior MRI. 5. Redemonstrated tiny chronic infarct within the right cerebellar hemisphere. 6. Bilateral mastoid effusions. Electronically Signed   By: Kellie Simmering D.O.   On: 12/24/2021 18:03     ASSESSMENT/PLAN:  This is a very pleasant 71 years old Caucasian male recently diagnosed with at least stage IIIA (T3, N1, M0) non-small cell lung cancer, squamous cell carcinoma presented with large left lower lobe lung mass in addition to left hilar and suspicious subcarinal lymphadenopathy diagnosed in September 2023, pending staging work-up.    He had a brain MRI that was negative for metastatic disease of the brain.   If the patient has no evidence of metastatic disease, he  will be treated with a course of concurrent chemoradiation with weekly carboplatin for AUC of 2 and paclitaxel 45 Mg/M2 for 6-7 weeks followed by consolidation immunotherapy if he has no evidence for disease progression after the induction phase.  This is a potentially curable condition if the patient has no metastatic disease.   His consultation with Dr. Sondra Come is scheduled for 10/23. His PET scan is scheduled for ***.   The patient was seen with Dr. Julien Nordmann. Labs were reviewed. Recommend delaying treatment until ***.   We will see him back for a follow up visit next week for evaluation and to review his PET scan before starting cycle #1.   The patient was advised to call immediately if he has any concerning symptoms in the interval. The patient voices understanding of current disease status and treatment options and is in agreement with the current care plan. All questions were answered. The patient knows to call the clinic with any problems, questions or concerns. We can certainly see the patient much sooner if necessary         No orders of the defined types were placed in this encounter.    I spent {CHL ONC TIME VISIT - LYYTK:[id number]} counseling the patient face to face. The total time spent in the appointment was {CHL ONC TIME VISIT - NTZGY:[id number]}.  Morine Kohlman L Ikeem Cleckler, PA-C 12/25/21

## 2021-12-27 ENCOUNTER — Other Ambulatory Visit: Payer: Self-pay | Admitting: Physician Assistant

## 2021-12-27 ENCOUNTER — Inpatient Hospital Stay: Payer: Medicare Other

## 2021-12-27 ENCOUNTER — Inpatient Hospital Stay: Payer: Medicare Other | Admitting: Physician Assistant

## 2021-12-27 ENCOUNTER — Telehealth: Payer: Self-pay

## 2021-12-27 NOTE — Telephone Encounter (Signed)
This nurse reached out to patient and made him aware that his appointments for tody have been cancelled due to him needing PET completed first.  Advised that office visit will be moved to 10/23 before his infusion and he will receive a call from scheduling to set the time.  Patient acknowledged understanding.  This nurse reminded patient of PET appointment.  No further questions or concerns at this time.

## 2021-12-30 ENCOUNTER — Ambulatory Visit: Payer: Medicare Other | Admitting: Internal Medicine

## 2021-12-30 ENCOUNTER — Encounter: Payer: Self-pay | Admitting: Internal Medicine

## 2021-12-30 ENCOUNTER — Encounter (HOSPITAL_COMMUNITY)
Admission: RE | Admit: 2021-12-30 | Discharge: 2021-12-30 | Disposition: A | Discharge status: Home / Self Care | Payer: Medicare Other | Source: Ambulatory Visit | Attending: Internal Medicine | Admitting: Internal Medicine

## 2021-12-30 VITALS — BP 110/70 | HR 69 | Ht 70.0 in | Wt 139.0 lb

## 2021-12-30 DIAGNOSIS — R1013 Epigastric pain: Secondary | ICD-10-CM | POA: Diagnosis not present

## 2021-12-30 DIAGNOSIS — K219 Gastro-esophageal reflux disease without esophagitis: Secondary | ICD-10-CM | POA: Diagnosis not present

## 2021-12-30 DIAGNOSIS — C3490 Malignant neoplasm of unspecified part of unspecified bronchus or lung: Secondary | ICD-10-CM | POA: Diagnosis not present

## 2021-12-30 DIAGNOSIS — C3492 Malignant neoplasm of unspecified part of left bronchus or lung: Secondary | ICD-10-CM | POA: Diagnosis not present

## 2021-12-30 LAB — GLUCOSE, CAPILLARY: Glucose-Capillary: 94 mg/dL (ref 70–99)

## 2021-12-30 MED ORDER — PANTOPRAZOLE SODIUM 40 MG PO TBEC: 40.0000 mg | DELAYED_RELEASE_TABLET | Freq: Every day | ORAL | 3 refills | Status: DC

## 2021-12-30 MED ORDER — FLUDEOXYGLUCOSE F - 18 (FDG) INJECTION
6.9000 | Freq: Once | INTRAVENOUS | Status: AC
  Administered 2021-12-30: 7.6 via INTRAVENOUS

## 2021-12-30 NOTE — Patient Instructions (Signed)
_______________________________________________________  If you are age 70 or older, your body mass index should be between 23-30. Your Body mass index is 19.94 kg/m. If this is out of the aforementioned range listed, please consider follow up with your Primary Care Provider.  If you are age 2 or younger, your body mass index should be between 19-25. Your Body mass index is 19.94 kg/m. If this is out of the aformentioned range listed, please consider follow up with your Primary Care Provider.   ________________________________________________________  The [facility] providers would like to encourage you to use [facility] to communicate with providers for non-urgent requests or questions.  Due to long hold times on the telephone, sending your provider a message by [facility] may be a faster and more efficient way to get a response.  Please allow 48 business hours for a response.  Please remember that this is for non-urgent requests.  _______________________________________________________  We have sent the following medications to your pharmacy for you to pick up at your convenience:  Pantoprazole

## 2021-12-30 NOTE — Progress Notes (Signed)
HISTORY OF PRESENT ILLNESS:  Cameron Roman is a 71 y.o. male with multiple medical problems who has been followed in this clinic for history of multiple and advanced adenomatous colon polyps, GERD complicated by peptic stricture requiring esophageal dilation, and chronic hepatitis C successfully treated with Harvoni (being followed at an outside liver specialty clinic).  Cameron Roman last underwent colonoscopy and upper endoscopy June 2021.  Colonoscopy revealed diverticulosis but no polyps.  Follow-up in 5 years recommended.  Upper endoscopy revealed a distal esophageal stricture which was dilated with 54 French Maloney dilator.  He is maintained on omeprazole 20 mg daily for reflux.  Patient tells me that he was diagnosed with lung cancer in June.  Review of the chart shows that the patient has a large left lower lobe mass with infiltrating characteristics.  Biopsy showed squamous cell carcinoma.  He is having a PET scan today.  He is to start radiation therapy next week.  Since his diagnosis he tells me that he has had discomfort in the epigastric region.  Most prominent when sitting up.  Not affected by meals.  No dysphagia.  Denies heartburn.  Also tells me that he is belching a lot.  He schedules this appointment himself stating "the medication for his reflux is not working".  Review of blood work from November 30, 2021 shows a hemoglobin of 10.9.  Baseline 13.6.  Comprehensive metabolic panel in June was normal.  Abdominal ultrasound July shows gallstones.  CT scan with large lung mass as described.  REVIEW OF SYSTEMS:  All non-GI ROS negative unless otherwise stated in the HPI except for arthritis, back pain  Past Medical History:  Diagnosis Date   Anxiety    Arthritis    Atrial fibrillation (HCC)    Chronic back pain    buldging disc   Chronic neck pain    ruptured disc   Cough    hx smoking   Diverticulosis    Esophageal stricture    GERD (gastroesophageal reflux disease)    takes Prilosec  daily   H/O hiatal hernia    Hearing loss    Hepatitis-C 2017   treated and cured per patient   Hiatal hernia    History of colon polyps    Insomnia    takes Elavil nightly   Lipoma of abdominal wall 02/11/2011   Lung cancer ([facility])    Stroke ([facility])    Type 2 diabetes mellitus ([place]) 07/22/2020   patient denies DM, per MD note pt is pre-diabetic    Past Surgical History:  Procedure Laterality Date   ANTERIOR CERVICAL DECOMP/DISCECTOMY FUSION  04/12/2011   Procedure: ANTERIOR CERVICAL DECOMPRESSION/DISCECTOMY FUSION 2 LEVELS;  Surgeon: Peggyann Shoals, MD;  Location: [facility]UR[facility] OR[facility];  Service: Neurosurgery;  Laterality: N/A;  exploration of Cervical four - seven  fusion with redo Cervical six- seven, cervical three- four anterior cervical decompression with fusion interbody prothesis plating and bonegraft and C34 anterior cervical decompression with inte   APPENDECTOMY     at age 59   BRONCHIAL BIOPSY  11/30/2021   Procedure: BRONCHIAL BIOPSIES;  Surgeon: Garner Nash, DO;  Location: [facility] ENDOSCOPY;  Service: Pulmonary;;   BRONCHIAL BRUSHINGS  11/30/2021   Procedure: BRONCHIAL BRUSHINGS;  Surgeon: Garner Nash, DO;  Location: [facility] END[facility]COPY;  Service: Pulmonary;;   CARDIAC CATHETERIZATION  06/03/04   COLONOSCOPY     EP IMPLANTABLE DEVICE N/A 09/14/2015   Procedure: Loop Recorder Insertion;  Surgeon: Evans Lance, MD;  Location: [facility]  CV LAB;  Service: Cardiovascular;  Laterality: N/A;   FINE NEEDLE ASPIRATION  11/30/2021   Procedure: FINE NEEDLE ASPIRATION (FNA) LINEAR;  Surgeon: Garner Nash, DO;  Location: [facility] ENDOSCOPY;  Service: Pulmonary;;   HAND SURGERY Right 2005   right   INNER EAR SURGERY Bilateral    bil;titanium in both ears   lower back surgery  2009   had plates and screws inserted   NECK SURGERY  2009   had insertion of  plates and screws   TEE WITHOUT CARDIOVERSION N/A 09/14/2015   Procedure: TRANSESOPHAGEAL ECHOCARDIOGRAM (TEE);  Surgeon: Thayer Headings, MD;   Location: [facility];  Service: Cardiovascular;  Laterality: N/A;   TRANSFORAMINAL LUMBAR INTERBODY FUSION (TLIF) WITH PEDICLE SCREW FIXATION 1 LEVEL Left 09/27/2019   Procedure: Left Lumbar 5 Sacral 1 Transforaminal lumbar interbody fusion with exploration/removal of adjacent level hardware;  Surgeon: Erline Levine, MD;  Location: [facility];  Service: Neurosurgery;  Laterality: Left;  3C/RM 19   VIDEO BRONCHOSCOPY WITH ENDOBRONCHIAL ULTRASOUND N/A 11/30/2021   Procedure: VIDEO BRONCHOSCOPY WITH ENDOBRONCHIAL ULTRASOUND;  Surgeon: Garner Nash, DO;  Location: [facility];  Service: Pulmonary;  Laterality: N/A;    Social History Cameron Roman  reports that he quit smoking about 19 years ago. His smoking use included cigarettes. He has a 41.00 pack-year smoking history. He has never used smokeless tobacco. He reports that he does not currently use alcohol after a past usage of about 1.0 standard drink of alcohol per week. He reports that he does not use drugs.  family history includes Cancer in his brother; Colon cancer in his father, maternal grandfather, and maternal grandmother; Healthy in his daughter and son; Heart disease in his maternal uncle; Other in his mother; Stroke in his maternal uncle.  Allergies  Allergen Reactions   Percocet [Oxycodone-Acetaminophen] Itching       PHYSICAL EXAMINATION: Vital signs: BP 110/70   Pulse 69   Ht 5' 10"  (1.778 m)   Wt 139 lb (63 kg)   BMI 19.94 kg/m   Constitutional: Thin male, no acute distress Psychiatric: alert and oriented x3, cooperative.  Somewhat anxious Eyes: extraocular movements intact, anicteric, conjunctiva pink Mouth: oral pharynx moist, no lesions Neck: supple no lymphadenopathy Cardiovascular: heart regular rate and rhythm, no murmur Lungs: clear to auscultation bilaterally Abdomen: soft, nontender, nondistended, no obvious ascites, no peritoneal signs, normal bowel sounds, no organomegaly Rectal: Omitted Extremities: no  clubbing, cyanosis, or lower extremity edema bilaterally Skin: no lesions on visible extremities Neuro: No focal deficits.  Cranial nerves intact.  No asterixis  ASSESSMENT:  1.  Problems with positional upper abdominal lower chest discomfort and belching as described.  All coincident with the diagnosis of large lung cancer.  I suspect that his epigastric discomfort is related to the lung mass.  I suspect that his belching is related to anxiety induced aerophagia. 2.  GERD with history of peptic stricture.  No classic symptoms on low-dose PPI.  No recurrent dysphagia. 3.  History of colon polyps.  Last colonoscopy without neoplasia.  Up-to-date. 4.  Lung cancer.  Squamous cell.  To be treated 5.  Multiple medical problems   PLAN:  1.  Change from omeprazole to pantoprazole 40 mg daily.  Prescribed.  Medication risk reviewed. 2.  Discussed aerophagia 3.  Discussed the likely origin of his pain.  No role for repeat endoscopy at this time. 4.  Continue care with oncology specialist A total time of 30 minutes was spent preparing to  see the patient, reviewing the myriad of data, obtaining interval history, performing medically appropriate physical examination, counseling and educating the patient regarding the above listed issues, ordering medication, and documenting clinical information in the health record

## 2021-12-31 MED FILL — Dexamethasone Sodium Phosphate Inj 100 MG/10ML: INTRAMUSCULAR | Qty: 1 | Status: AC

## 2021-12-31 NOTE — Progress Notes (Unsigned)
[place] OFFICE PROGRESS NOTE  Dettinger, [name], MD [place] [address]  DIAGNOSIS: Stage IIIB (T3, N2, M0) non-small cell lung cancer, squamous cell carcinoma presented with large left lower lobe lung mass in addition subcarinal and AP window nodal metastases. Diagnosed in September 2023.   PRIOR THERAPY: None  CURRENT THERAPY: Concurrent chemo/radiation with carboplatin for an AUC of 2 and paclitaxel 45 mg/m2 weekly. First dose expected 01/10/22  INTERVAL HISTORY: Cameron Roman 71 y.o. male returns to the clinic today for a follow-up visit.  The patient was recently diagnosed with lung cancer.  He had his staging PET scan on 12/30/2021.  He saw Dr. Sondra Come this morning to discuss concurrent chemoradiation. Dr. Sondra Come is planning to start his radiation on 01/10/22. Today the patient is feeling fairly well without any concerning complaints or changes in his health.  Denies any fever, chills, night sweats, or unexplained weight loss.  He has some chest discomfort since his diagnosis which Dr. Julien Nordmann felt was secondary to mediastinal invasion. This is stable and not associated with activity or positioning. Denies any shortness of breath, cough, or hemoptysis.  Denies any nausea, vomiting, diarrhea, or constipation.  He saw GI, Dr. Henrene Pastor, for indigestion on 10/19.   Denies any headache or visual changes.  He recently had a staging brain MRI.  He is here today for evaluation and for more detailed discussion about his current condition and recommended treatment     MEDICAL HISTORY: Past Medical History:  Diagnosis Date   Anxiety    Arthritis    Atrial fibrillation ([place])    Chronic back pain    buldging disc   Chronic neck pain    ruptured disc   Cough    hx smoking   Diverticulosis    Esophageal stricture    GERD (gastroesophageal reflux disease)    takes Prilosec daily   H/O hiatal hernia    Hearing loss    Hepatitis-C 2017   treated and cured per  patient   Hiatal hernia    History of colon polyps    Insomnia    takes Elavil nightly   Lipoma of abdominal wall 02/11/2011   Lung cancer ([place])    Stroke ([place])    x 2   Type 2 diabetes mellitus ([place]) 07/22/2020   patient denies DM, per MD note pt is pre-diabetic    ALLERGIES:  is allergic to percocet [oxycodone-acetaminophen].  MEDICATIONS:  Current Outpatient Medications  Medication Sig Dispense Refill   apixaban (ELIQUIS) 5 MG TABS tablet TAKE ONE TABLET BY MOUTH TWICE DAILY 60 tablet 5   fluticasone (FLONASE) 50 MCG/ACT nasal spray Place 1 spray into the nose in the morning.     gabapentin (NEURONTIN) 300 MG capsule Take 1 capsule (300 mg total) by mouth 3 (three) times daily. Take one capsule twice to three times daily (Patient taking differently: Take 300 mg by mouth 2 (two) times daily.) 270 capsule 3   HYDROcodone-acetaminophen (NORCO) 10-325 MG tablet Take 1 tablet by mouth 3 (three) times daily. 30 tablet 0   Omega-3 Fatty Acids (FISH OIL) 1000 MG CAPS Take 1,000 mg by mouth in the morning and at bedtime.      omeprazole (PRILOSEC) 20 MG capsule Take 1 capsule (20 mg total) by mouth daily. 90 capsule 3   pantoprazole (PROTONIX) 40 MG tablet Take 1 tablet (40 mg total) by mouth daily. 90 tablet 3   pravastatin (PRAVACHOL) 20 MG tablet Take  1 tablet (20 mg total) by mouth daily. Take one tablet once daily (Patient taking differently: Take 20 mg by mouth at bedtime. Take one tablet once daily) 90 tablet 3   prochlorperazine (COMPAZINE) 10 MG tablet Take 1 tablet (10 mg total) by mouth every 6 (six) hours as needed for nausea or vomiting. 30 tablet 1   tamsulosin (FLOMAX) 0.4 MG CAPS capsule Take 1 capsule (0.4 mg total) by mouth daily. 90 capsule 3   Current Facility-Administered Medications  Medication Dose Route Frequency Provider Last Rate Last Admin   methylPREDNISolone acetate (DEPO-MEDROL) injection 80 mg  80 mg Intramuscular Once Dettinger, [name], MD        SURGICAL  HISTORY:  Past Surgical History:  Procedure Laterality Date   ANTERIOR CERVICAL DECOMP/DISCECTOMY FUSION  04/12/2011   Procedure: ANTERIOR CERVICAL DECOMPRESSION/DISCECTOMY FUSION 2 LEVELS;  Surgeon: Peggyann Shoals, MD;  Location: [facility];  Service: Neurosurgery;  Laterality: N/A;  exploration of Cervical four - seven  fusion with redo Cervical six- seven, cervical three- four anterior cervical decompression with fusion interbody prothesis plating and bonegraft and C34 anterior cervical decompression with inte   APPENDECTOMY     at age 79   BRONCHIAL BIOPSY  11/30/2021   Procedure: BRONCHIAL BIOPSIES;  Surgeon: Garner Nash, DO;  Location: [facility];  Service: Pulmonary;;   BRONCHIAL BRUSHINGS  11/30/2021   Procedure: BRONCHIAL BRUSHINGS;  Surgeon: Garner Nash, DO;  Location: [facility];  Service: Pulmonary;;   CARDIAC CATHETERIZATION  06/03/2004   COLONOSCOPY     EP IMPLANTABLE DEVICE N/A 09/14/2015   Procedure: Loop Recorder Insertion;  Surgeon: Evans Lance, MD;  Location: [facility];  Service: Cardiovascular;  Laterality: N/A;   FINE NEEDLE ASPIRATION  11/30/2021   Procedure: FINE NEEDLE ASPIRATION (FNA) LINEAR;  Surgeon: Garner Nash, DO;  Location: [facility];  Service: Pulmonary;;   HAND SURGERY Right 03/15/2003   right x 3   INNER EAR SURGERY Bilateral    bil;titanium in both ears   lower back surgery  03/15/2007   had plates and screws inserted   NECK SURGERY  03/15/2007   had insertion of  plates and screws   TEE WITHOUT CARDIOVERSION N/A 09/14/2015   Procedure: TRANSESOPHAGEAL ECHOCARDIOGRAM (TEE);  Surgeon: Thayer Headings, MD;  Location: [facility];  Service: Cardiovascular;  Laterality: N/A;   TRANSFORAMINAL LUMBAR INTERBODY FUSION (TLIF) WITH PEDICLE SCREW FIXATION 1 LEVEL Left 09/27/2019   Procedure: Left Lumbar 5 Sacral 1 Transforaminal lumbar interbody fusion with exploration/removal of adjacent level hardware;  Surgeon: Erline Levine, MD;  Location: [facility];  Service: Neurosurgery;  Laterality: Left;  3C/RM 19   VIDEO BRONCHOSCOPY WITH ENDOBRONCHIAL ULTRASOUND N/A 11/30/2021   Procedure: VIDEO BRONCHOSCOPY WITH ENDOBRONCHIAL ULTRASOUND;  Surgeon: Garner Nash, DO;  Location: [facility];  Service: Pulmonary;  Laterality: N/A;    REVIEW OF SYSTEMS:   Review of Systems  Constitutional: Negative for appetite change, chills, fatigue, fever and unexpected weight change.  HENT: Negative for mouth sores, nosebleeds, sore throat and trouble swallowing.   Eyes: Negative for eye problems and icterus.  Respiratory: Negative for cough, hemoptysis, shortness of breath and wheezing.   Cardiovascular: Positive for chest discomfort. Negative for leg swelling.  Gastrointestinal: Negative for abdominal pain, constipation, diarrhea, nausea and vomiting.  Genitourinary: Negative for bladder incontinence, difficulty urinating, dysuria, frequency and hematuria.   Musculoskeletal: Negative for back pain, gait problem, neck pain and neck stiffness.  Skin: Negative for  itching and rash.  Neurological: Negative for dizziness, extremity weakness, gait problem, headaches, light-headedness and seizures.  Hematological: Negative for adenopathy. Does not bruise/bleed easily.  Psychiatric/Behavioral: Negative for confusion, depression and sleep disturbance. The patient is not nervous/anxious.     PHYSICAL EXAMINATION:  Blood pressure (!) 138/95, pulse (!) 50, temperature 98.2 F (36.8 C), resp. rate 15, weight 138 lb 11.2 oz (62.9 kg), SpO2 99 %.  ECOG PERFORMANCE STATUS: 1  Physical Exam  Constitutional: Oriented to person, place, and time and well-developed, well-nourished, and in no distress.  HENT:  Head: Normocephalic and atraumatic.  Mouth/Throat: Oropharynx is clear and moist. No oropharyngeal exudate.  Eyes: Conjunctivae are normal. Right eye exhibits no discharge. Left eye exhibits no discharge. No scleral icterus.  Neck:  Normal range of motion. Neck supple.  Cardiovascular: Bradycardic, regular rhythm, normal heart sounds and intact distal pulses.   Pulmonary/Chest: Effort normal and breath sounds normal. No respiratory distress. No wheezes. No rales.  Abdominal: Soft. Bowel sounds are normal. Exhibits no distension and no mass. There is no tenderness.  Musculoskeletal: Normal range of motion. Exhibits no edema.  Lymphadenopathy:    No cervical adenopathy.  Neurological: Alert and oriented to person, place, and time. Exhibits normal muscle tone. Gait normal. Coordination normal.  Skin: Skin is warm and dry. No rash noted. Not diaphoretic. No erythema. No pallor.  Psychiatric: Mood, memory and judgment normal.  Vitals reviewed.  LABORATORY DATA: Lab Results  Component Value Date   WBC 11.5 (H) 01/03/2022   HGB 12.3 (L) 01/03/2022   HCT 35.7 (L) 01/03/2022   MCV 93.0 01/03/2022   PLT 357 01/03/2022      Chemistry      Component Value Date/Time   NA 136 01/03/2022 0934   NA 140 08/30/2021 1659   K 4.1 01/03/2022 0934   CL 102 01/03/2022 0934   CO2 28 01/03/2022 0934   BUN 15 01/03/2022 0934   BUN 17 08/30/2021 1659   CREATININE 0.78 01/03/2022 0934   CREATININE 0.80 08/30/2012 0955      Component Value Date/Time   CALCIUM 10.0 01/03/2022 0934   ALKPHOS 75 01/03/2022 0934   AST 10 (L) 01/03/2022 0934   ALT 6 01/03/2022 0934   BILITOT 0.3 01/03/2022 0934       RADIOGRAPHIC STUDIES:  NM PET Image Initial (PI) Skull Base To Thigh (F-18 FDG)  Result Date: 01/01/2022 CLINICAL DATA:  Initial treatment strategy for left lung non-small-cell carcinoma. EXAM: NUCLEAR MEDICINE PET SKULL BASE TO THIGH TECHNIQUE: 7.6 mCi F-18 FDG was injected intravenously. Full-ring PET imaging was performed from the skull base to thigh after the radiotracer. CT data was obtained and used for attenuation correction and anatomic localization. Fasting blood glucose: 94 mg/dl COMPARISON:  Chest CT on 11/03/2021  FINDINGS: Mediastinal blood-pool activity (background): SUV max = 1.9 Liver activity (reference): SUV max = N/A NECK:  No hypermetabolic lymph nodes or masses. Incidental CT findings:  None. CHEST: 6.4 x 4.1 cm mass with irregular margins is again seen in superior and medial left lower lobe, which involves right hilum. This shows intense hypermetabolism, with SUV max of 18.5. 10 mm mediastinal lymph node in the AP window shows mild hypermetabolism, with SUV max of 2.6. 12 mm subcarinal lymph node shows mild hypermetabolism, with SUV max of 3.0. Incidental CT findings: Moderate centrilobular emphysema. No evidence of pleural effusion. Aortic and coronary atherosclerotic calcification incidentally noted. ABDOMEN/PELVIS: No abnormal hypermetabolic activity within the liver, pancreas, adrenal glands, or spleen. No  hypermetabolic lymph nodes in the abdomen or pelvis. Incidental CT findings: 5 mm nonobstructing calculus in the lower pole of right kidney. Aortic atherosclerotic calcification incidentally noted. SKELETON: No focal hypermetabolic bone lesions to suggest skeletal metastasis. Incidental CT findings:  None. IMPRESSION: 6.4 cm hypermetabolic mass in left lower lobe involvement of the left hilum, consistent with primary bronchogenic carcinoma. Small mediastinal lymph nodes in AP window and subcarinal region show mild hypermetabolic activity, suspicious for lymph node metastases. Evidence of distant metastatic disease. Aortic Atherosclerosis (ICD10-I70.0) and Emphysema (ICD10-J43.9). Electronically Signed   By: Marlaine Hind M.D.   On: 01/01/2022 14:13   MR BRAIN W WO CONTRAST  Result Date: 12/24/2021 CLINICAL DATA:  Provided history: Malignant neoplasm of unspecified part of unspecified bronchus or lung. Non-small cell lung cancer, staging. EXAM: MRI HEAD WITHOUT AND WITH CONTRAST TECHNIQUE: Multiplanar, multiecho pulse sequences of the brain and surrounding structures were obtained without and with  intravenous contrast. CONTRAST:  6mL GADAVIST GADOBUTROL 1 MMOL/ML IV SOLN COMPARISON:  Brain MRI 09/11/2015. MRA head 09/11/2015. FINDINGS: Brain: Mild generalized cerebral atrophy. Redemonstrated small chronic cortically-based infarcts within the left parietal and occipital lobes (left PCA vascular territory). A small chronic cortical infarct within the right occipital lobe (right PCA vascular territory) was better appreciated on the prior brain MRI of 09/11/2015. Mild multifocal T2 FLAIR hyperintense signal abnormality within the cerebral white matter, nonspecific but compatible with chronic small vessel ischemic disease. Chronic lacunar infarct versus prominent perivascular space within the medial left thalamus, new from the prior MRI. Tiny chronic infarct within the right cerebellar hemisphere. There is no acute infarct. No evidence of an intracranial mass. No chronic intracranial blood products. No extra-axial fluid collection. No midline shift. No pathologic intracranial enhancement identified. Vascular: Maintained flow voids within the proximal large arterial vessels. Skull and upper cervical spine: No focal suspicious marrow lesion. Susceptibility artifact arising from ACDF hardware. Sinuses/Orbits: No mass or acute finding within the imaged orbits. Minimal mucosal thickening within the bilateral ethmoid and sphenoid sinuses. Other: Bilateral mastoid effusions (greater on the right). IMPRESSION: 1. No evidence of intracranial metastatic disease. 2. Known small chronic cortically-based infarcts within the bilateral PCA vascular territories. 3. Mild chronic small vessel ischemic changes within the cerebral white matter, slightly progressed from the prior brain MRI of 09/11/2015 4. Chronic lacunar infarct versus prominent perivascular space within the medial left thalamus, new from the prior MRI. 5. Redemonstrated tiny chronic infarct within the right cerebellar hemisphere. 6. Bilateral mastoid effusions.  Electronically Signed   By: Kellie Simmering D.O.   On: 12/24/2021 18:03     ASSESSMENT/PLAN:  This is a very pleasant 71 years old Caucasian male recently diagnosed with at least stage IIIB (T3, N2, M0) non-small cell lung cancer, squamous cell carcinoma presented with large left lower lobe lung mass in addition to subcarinal and AP window lymphadenopathy diagnosed in September 2023.   He had a brain MRI that was negative for metastatic disease of the brain.   The patient was seen with Dr. Julien Nordmann who personally and independently reviewed his PET scan. He will be treated with a course of concurrent chemoradiation with weekly carboplatin for AUC of 2 and paclitaxel 45 Mg/M2 for 6-7 weeks followed by consolidation immunotherapy if he has no evidence for disease progression after the induction phase.  This is a potentially curable condition if the patient has no metastatic disease.   His consultation with Dr. Sondra Come was scheduled for this morning on 10/23. He is going to start  radiation on Monday 01/10/22  The patient was seen with Dr. Julien Nordmann. Labs were reviewed. Recommend delaying treatment until 01/10/22.   We will see him back for a follow up visit in 2 weeks for evaluation and repeat blood work before starting cycle #2.   I tried to help the patient set up his Mychart account today. His schedule will slightly change the start time of treatment next week due to seeing Dr. Julien Nordmann and I today. I reviewed mychart is a good way to monitor his upcoming appointments.   The patient was advised to call immediately if he has any concerning symptoms in the interval. The patient voices understanding of current disease status and treatment options and is in agreement with the current care plan. All questions were answered. The patient knows to call the clinic with any problems, questions or concerns. We can certainly see the patient much sooner if necessary   No orders of the defined types were placed in  this encounter.    Drago Hammonds L Arneisha Kincannon, PA-C 01/03/22  ADDENDUM: Hematology/Oncology Attending: I had a face-to-face encounter with the patient today.  I reviewed his record, lab, scan and recommended his care plan.  This is a very pleasant 71 years old white male diagnosed with a stage IIIb (T3, N2, M0) non-small cell lung cancer, squamous cell carcinoma presented with large left lower lobe lung mass in addition to subcarinal and AP window lymphadenopathy diagnosed in September 2023. The patient had a recent MRI of the brain as well as a PET scan.  I discussed the imaging studies with the patient today and there was no concerning finding for disease progression to the brain or outside the chest. I recommended for the patient to proceed with a course of concurrent chemoradiation with weekly carboplatin and paclitaxel as previously planned and he is expected to start the first dose of his treatment next week. We will see him back for follow-up visit in 2 weeks for evaluation and management of any adverse effect of his treatment. The patient was advised to call immediately if he has any other concerning symptoms in the interval. The total time spent in the appointment was 30 minutes. Disclaimer: This note was dictated with voice recognition software. Similar sounding words can inadvertently be transcribed and may be missed upon review. Eilleen Kempf, MD

## 2021-12-31 NOTE — Progress Notes (Signed)
Thoracic Location of Tumor / Histology: at least stage IIIA (T3, N1, M0) non-small cell lung cancer, squamous cell carcinoma presented with large left lower lobe lung mass in addition to left hilar and suspicious subcarinal lymphadenopathy   Patient presented with symptoms of:  shortness of breath, mid back pain and weight loss.   Biopsies revealed:   11/30/2021 INAL MICROSCOPIC DIAGNOSIS:  A. LUNG, LEFT LUNG MASS, FINE NEEDLE ASPIRATION:  - Malignant cells consistent with squamous cell carcinoma  B. LUNG, LEFT LUNG MASS, BRUSH:  - Malignant cells consistent with squamous cell carcinoma   FINAL MICROSCOPIC DIAGNOSIS:  C. LYMPH NODE, STATION 7, FINE NEEDLE ASPIRATION:  - No malignant cells identified   Tobacco/Marijuana/Snuff/ETOH use: {:18581}  Past/Anticipated interventions by cardiothoracic surgery, if any: {:18581}  Past/Anticipated interventions by medical oncology, if any: concurrent chemoradiation with weekly carboplatin for AUC of 2 and paclitaxel 45 Mg/M2 for 6-7 weeks followed by consolidation immunotherapy.  Signs/Symptoms Weight changes, if any: {:18581} Respiratory complaints, if any: {:18581} Hemoptysis, if any: {:18581} Pain issues, if any:  {:18581}  SAFETY ISSUES: Prior radiation? {:18581} Pacemaker/ICD? {:18581}  Possible current pregnancy?no Is the patient on methotrexate? {:18581}  Current Complaints / other details:  ***

## 2022-01-02 NOTE — Progress Notes (Signed)
Radiation Oncology         [phone] ________________________________  Initial Outpatient Consultation  Name: Cameron Roman MRN: [id number]  Date: 01/03/2022  DOB: October 14, 1950  [id number], Cameron Kaufmann, MD  Cameron Bears, MD   REFERRING PHYSICIAN: Curt Bears, MD  DIAGNOSIS: There were no encounter diagnoses.  At least stage IIIA (T3, N1, M0) non-small cell lung cancer, squamous cell carcinoma presented with large left lower lobe lung mass in addition to left hilar and suspicious subcarinal lymphadenopathy  HISTORY OF PRESENT ILLNESS::Cameron Roman is a 71 y.o. male who is accompanied by ***. he is seen as a courtesy of Dr. Julien Roman for an opinion concerning radiation therapy as part of management for his recently diagnosed left lung cancer.   The patient presented with shortness of breath and mid back pain which prompted x-ray's of the chest on thoracic spine on 09/02/21. Imaging revealed a spiculated mass in the posterior chest likely in the superior segment of the left lower lobe. Both lungs also appeared hyperinflated, and emphysematous changes were seen, greatest in the upper lobes. No visible effusions or pneumothorax, or discernible adenopathy were appreciated.   Chest CT on 09/06/21 further revealed a 5.2 cm mass in the superior segment left lower lobe, extending all the way to the left hilum. CT otherwise showed no associated mediastinal or hilar adenopathy.   He was then referred to Dr. Marylou Roman, [place], on 09/21/21 who recommended proceeding with a PET scan for staging. Dr. Marylou Roman also recommended a referral to pulmonology for bronchoscopy and biopsies.   Super-D chest CT without contrast on 11/03/21 showed the large LLL mass with peripheral satellite nodules, septal thickening, and adjacent hilar adenopathy compatible with a large bronchogenic neoplasm. CT also showed the mass with possible extension into the extrapleural fat, abutting the thoracic  aorta and left hilum, and distorting and tethering the major fissure in the left chest. Lastly, CT showed suspicious mild subcarinal nodal enlargement   Following evaluation and discussion with Dr. Silas Roman, the patient opted to proceed with bronchoscopy with biopsies on 11/30/21 under Dr. Valeta Roman. Biopsies of the left lung mass revealed malignant cells consistent with squamous cell carcinoma.  FNA of a station 7 lymph node showed no evidence of malignancy.   Accordingly, the patient was referred to Dr. Julien Roman on 12/16/21 to discuss treatment options. During this visit, the patient denied any respiratory symptoms, and denied any weight loss (he did report some initially prior to his bronchoscopy but this was due to a decrease in appetite from his wife passing away). In terms of treatment options (pending no evidence metastatic disease on staging work-up), Dr. Julien Roman recommended a course of concurrent chemoradiation with weekly carboplatin for and paclitaxel for 6-7 weeks followed by consolidation immunotherapy if he has no evidence for disease progression after the induction phase.   Pertinent imaging/work-up performed this far includes:  --MRI of the brain on 12/24/21 which showed no evidence of intracranial metastatic disease.  -- PET scan on 12/30/21 showed the 6.4 cm LLL mass as intensely hypermetabolic (SUV max of 82.9), involving the left hilum, and consistent with primary bronchogenic carcinoma. PET also showed small mediastinal lymph nodes in the AP window and subcarinal region with mild hypermetabolic activity suspicious for lymph node metastases. No evidence of distant metastatic disease was appreciated.     The patient is followed by GI for his history of multiple and advanced adenomatous colonic polyps, and GERD complicated by peptic stricture which esophageal dilation. He has associated chronic  intermittent abdominal pain. During a recent follow up with GI on 12/30/21, the patient reported  epigastric discomfort since his lung cancer diagnosis, mostly when sitting up.   Of note: The patient's wife passed away earlier this year from extensive stage small cell lung cancer. She was also a patient of Dr. Julien Roman.   PREVIOUS RADIATION THERAPY: No  PAST MEDICAL HISTORY:  Past Medical History:  Diagnosis Date   Anxiety    Arthritis    Atrial fibrillation (HCC)    Chronic back pain    buldging disc   Chronic neck pain    ruptured disc   Cough    hx smoking   Diverticulosis    Esophageal stricture    GERD (gastroesophageal reflux disease)    takes Prilosec daily   H/O hiatal hernia    Hearing loss    Hepatitis-C 2017   treated and cured per patient   Hiatal hernia    History of colon polyps    Insomnia    takes Elavil nightly   Lipoma of abdominal wall 02/11/2011   Lung cancer ([place])    Stroke ([place])    Type 2 diabetes mellitus ([place]) 07/22/2020   patient denies DM, per MD note pt is pre-diabetic    PAST SURGICAL HISTORY: Past Surgical History:  Procedure Laterality Date   ANTERIOR CERVICAL DECOMP/DISCECTOMY FUSION  04/12/2011   Procedure: ANTERIOR CERVICAL DECOMPRESSION/DISCECTOMY FUSION 2 LEVELS;  Surgeon: Cameron Shoals, MD;  Location: [facility];  Service: Neurosurgery;  Laterality: N/A;  exploration of Cervical four - seven  fusion with redo Cervical six- seven, cervical three- four anterior cervical decompression with fusion interbody prothesis plating and bonegraft and C34 anterior cervical decompression with inte   APPENDECTOMY     at age 70   BRONCHIAL BIOPSY  11/30/2021   Procedure: BRONCHIAL BIOPSIES;  Surgeon: Cameron Nash, DO;  Location: [facility];  Service: Pulmonary;;   BRONCHIAL BRUSHINGS  11/30/2021   Procedure: BRONCHIAL BRUSHINGS;  Surgeon: Cameron Nash, DO;  Location: [facility];  Service: Pulmonary;;   CARDIAC CATHETERIZATION  06/03/04   COLONOSCOPY     EP IMPLANTABLE DEVICE N/A 09/14/2015   Procedure: Loop Recorder Insertion;   Surgeon: Cameron Lance, MD;  Location: [facility];  Service: Cardiovascular;  Laterality: N/A;   FINE NEEDLE ASPIRATION  11/30/2021   Procedure: FINE NEEDLE ASPIRATION (FNA) LINEAR;  Surgeon: Cameron Nash, DO;  Location: [facility];  Service: Pulmonary;;   HAND SURGERY Right 2005   right   INNER EAR SURGERY Bilateral    bil;titanium in both ears   lower back surgery  2009   had plates and screws inserted   NECK SURGERY  2009   had insertion of  plates and screws   TEE WITHOUT CARDIOVERSION N/A 09/14/2015   Procedure: TRANSESOPHAGEAL ECHOCARDIOGRAM (TEE);  Surgeon: Thayer Headings, MD;  Location: [facility];  Service: Cardiovascular;  Laterality: N/A;   TRANSFORAMINAL LUMBAR INTERBODY FUSION (TLIF) WITH PEDICLE SCREW FIXATION 1 LEVEL Left 09/27/2019   Procedure: Left Lumbar 5 Sacral 1 Transforaminal lumbar interbody fusion with exploration/removal of adjacent level hardware;  Surgeon: Erline Levine, MD;  Location: [facility];  Service: Neurosurgery;  Laterality: Left;  3C/RM 19   VIDEO BRONCHOSCOPY WITH ENDOBRONCHIAL ULTRASOUND N/A 11/30/2021   Procedure: VIDEO BRONCHOSCOPY WITH ENDOBRONCHIAL ULTRASOUND;  Surgeon: Cameron Nash, DO;  Location: [facility];  Service: Pulmonary;  Laterality: N/A;    FAMILY HISTORY:  Family History  Problem Relation Age  of Onset   Colon cancer Father    Other Mother        Perforated bowel   Colon cancer Maternal Grandmother    Colon cancer Maternal Grandfather    Heart disease Maternal Uncle    Stroke Maternal Uncle    Cancer Brother    Healthy Son    Healthy Daughter    Anesthesia problems Neg Hx    Hypotension Neg Hx    Malignant hyperthermia Neg Hx    Pseudochol deficiency Neg Hx     SOCIAL HISTORY:  Social History   Tobacco Use   Smoking status: Former    Packs/day: 1.00    Years: 41.00    Total pack years: 41.00    Types: Cigarettes    Quit date: 06/29/2002    Years since quitting: 19.5   Smokeless tobacco: Never    Tobacco comments:    Pt quit smoking in 2004. 10/21/21  Vaping Use   Vaping Use: Never used  Substance Use Topics   Alcohol use: Not Currently    Alcohol/week: 1.0 standard drink of alcohol    Types: 1 Cans of beer per week   Drug use: No    ALLERGIES:  Allergies  Allergen Reactions   Percocet [Oxycodone-Acetaminophen] Itching    MEDICATIONS:  Current Outpatient Medications  Medication Sig Dispense Refill   apixaban (ELIQUIS) 5 MG TABS tablet TAKE ONE TABLET BY MOUTH TWICE DAILY 60 tablet 5   fluticasone (FLONASE) 50 MCG/ACT nasal spray Place 1 spray into the nose in the morning.     gabapentin (NEURONTIN) 300 MG capsule Take 1 capsule (300 mg total) by mouth 3 (three) times daily. Take one capsule twice to three times daily (Patient taking differently: Take 300 mg by mouth 2 (two) times daily.) 270 capsule 3   HYDROcodone-acetaminophen (NORCO) 10-325 MG tablet Take 1 tablet by mouth 3 (three) times daily. 30 tablet 0   Omega-3 Fatty Acids (FISH OIL) 1000 MG CAPS Take 1,000 mg by mouth in the morning and at bedtime.      omeprazole (PRILOSEC) 20 MG capsule Take 1 capsule (20 mg total) by mouth daily. 90 capsule 3   pantoprazole (PROTONIX) 40 MG tablet Take 1 tablet (40 mg total) by mouth daily. 90 tablet 3   pravastatin (PRAVACHOL) 20 MG tablet Take 1 tablet (20 mg total) by mouth daily. Take one tablet once daily (Patient taking differently: Take 20 mg by mouth at bedtime. Take one tablet once daily) 90 tablet 3   prochlorperazine (COMPAZINE) 10 MG tablet Take 1 tablet (10 mg total) by mouth every 6 (six) hours as needed for nausea or vomiting. 30 tablet 1   tamsulosin (FLOMAX) 0.4 MG CAPS capsule Take 1 capsule (0.4 mg total) by mouth daily. 90 capsule 3   Current Facility-Administered Medications  Medication Dose Route Frequency Provider Last Rate Last Admin   methylPREDNISolone acetate (DEPO-MEDROL) injection 80 mg  80 mg Intramuscular Once Dettinger, Cameron Kaufmann, MD        REVIEW  OF SYSTEMS:  A 10+ POINT REVIEW OF SYSTEMS WAS OBTAINED including neurology, dermatology, psychiatry, cardiac, respiratory, lymph, extremities, GI, GU, musculoskeletal, constitutional, reproductive, HEENT. ***   PHYSICAL EXAM:  vitals were not taken for this visit.   General: Alert and oriented, in no acute distress HEENT: Head is normocephalic. Extraocular movements are intact. Oropharynx is clear. Neck: Neck is supple, no palpable cervical or supraclavicular lymphadenopathy. Heart: Regular in rate and rhythm with no murmurs, rubs, or gallops.  Chest: Clear to auscultation bilaterally, with no rhonchi, wheezes, or rales. Abdomen: Soft, nontender, nondistended, with no rigidity or guarding. Extremities: No cyanosis or edema. Lymphatics: see Neck Exam Skin: No concerning lesions. Musculoskeletal: symmetric strength and muscle tone throughout. Neurologic: Cranial nerves II through XII are grossly intact. No obvious focalities. Speech is fluent. Coordination is intact. Psychiatric: Judgment and insight are intact. Affect is appropriate. ***  ECOG = ***  0 - Asymptomatic (Fully active, able to carry on all predisease activities without restriction)  1 - Symptomatic but completely ambulatory (Restricted in physically strenuous activity but ambulatory and able to carry out work of a light or sedentary nature. For example, light housework, office work)  2 - Symptomatic, <50% in bed during the day (Ambulatory and capable of all self care but unable to carry out any work activities. Up and about more than 50% of waking hours)  3 - Symptomatic, >50% in bed, but not bedbound (Capable of only limited self-care, confined to bed or chair 50% or more of waking hours)  4 - Bedbound (Completely disabled. Cannot carry on any self-care. Totally confined to bed or chair)  5 - Death   Eustace Pen MM, Creech RH, Tormey DC, et al. [id number]). "Toxicity and response criteria of the [facility] Group".  [place] Oncol. 5 (6): 649-55  LABORATORY DATA:  Lab Results  Component Value Date   WBC 13.1 (H) 12/16/2021   HGB 11.0 (L) 12/16/2021   HCT 31.7 (L) 12/16/2021   MCV 93.2 12/16/2021   PLT 348 12/16/2021   NEUTROABS 6.9 12/16/2021   Lab Results  Component Value Date   NA 136 12/16/2021   K 4.1 12/16/2021   CL 103 12/16/2021   CO2 28 12/16/2021   GLUCOSE 118 (H) 12/16/2021   BUN 20 12/16/2021   CREATININE 0.98 12/16/2021   CALCIUM 9.3 12/16/2021      RADIOGRAPHY: NM PET Image Initial (PI) Skull Base To Thigh (F-18 FDG)  Result Date: 01/01/2022 CLINICAL DATA:  Initial treatment strategy for left lung non-small-cell carcinoma. EXAM: NUCLEAR MEDICINE PET SKULL BASE TO THIGH TECHNIQUE: 7.6 mCi F-18 FDG was injected intravenously. Full-ring PET imaging was performed from the skull base to thigh after the radiotracer. CT data was obtained and used for attenuation correction and anatomic localization. Fasting blood glucose: 94 mg/dl COMPARISON:  Chest CT on 11/03/2021 FINDINGS: Mediastinal blood-pool activity (background): SUV max = 1.9 Liver activity (reference): SUV max = N/A NECK:  No hypermetabolic lymph nodes or masses. Incidental CT findings:  None. CHEST: 6.4 x 4.1 cm mass with irregular margins is again seen in superior and medial left lower lobe, which involves right hilum. This shows intense hypermetabolism, with SUV max of 18.5. 10 mm mediastinal lymph node in the AP window shows mild hypermetabolism, with SUV max of 2.6. 12 mm subcarinal lymph node shows mild hypermetabolism, with SUV max of 3.0. Incidental CT findings: Moderate centrilobular emphysema. No evidence of pleural effusion. Aortic and coronary atherosclerotic calcification incidentally noted. ABDOMEN/PELVIS: No abnormal hypermetabolic activity within the liver, pancreas, adrenal glands, or spleen. No hypermetabolic lymph nodes in the abdomen or pelvis. Incidental CT findings: 5 mm nonobstructing calculus in the lower  pole of right kidney. Aortic atherosclerotic calcification incidentally noted. SKELETON: No focal hypermetabolic bone lesions to suggest skeletal metastasis. Incidental CT findings:  None. IMPRESSION: 6.4 cm hypermetabolic mass in left lower lobe involvement of the left hilum, consistent with primary bronchogenic carcinoma. Small mediastinal lymph nodes in AP window and subcarinal  region show mild hypermetabolic activity, suspicious for lymph node metastases. Evidence of distant metastatic disease. Aortic Atherosclerosis (ICD10-I70.0) and Emphysema (ICD10-J43.9). Electronically Signed   By: Marlaine Hind M.D.   On: 01/01/2022 14:13   MR BRAIN W WO CONTRAST  Result Date: 12/24/2021 CLINICAL DATA:  Provided history: Malignant neoplasm of unspecified part of unspecified bronchus or lung. Non-small cell lung cancer, staging. EXAM: MRI HEAD WITHOUT AND WITH CONTRAST TECHNIQUE: Multiplanar, multiecho pulse sequences of the brain and surrounding structures were obtained without and with intravenous contrast. CONTRAST:  75mL GADAVIST GADOBUTROL 1 MMOL/ML IV SOLN COMPARISON:  Brain MRI 09/11/2015. MRA head 09/11/2015. FINDINGS: Brain: Mild generalized cerebral atrophy. Redemonstrated small chronic cortically-based infarcts within the left parietal and occipital lobes (left PCA vascular territory). A small chronic cortical infarct within the right occipital lobe (right PCA vascular territory) was better appreciated on the prior brain MRI of 09/11/2015. Mild multifocal T2 FLAIR hyperintense signal abnormality within the cerebral white matter, nonspecific but compatible with chronic small vessel ischemic disease. Chronic lacunar infarct versus prominent perivascular space within the medial left thalamus, new from the prior MRI. Tiny chronic infarct within the right cerebellar hemisphere. There is no acute infarct. No evidence of an intracranial mass. No chronic intracranial blood products. No extra-axial fluid collection. No  midline shift. No pathologic intracranial enhancement identified. Vascular: Maintained flow voids within the proximal large arterial vessels. Skull and upper cervical spine: No focal suspicious marrow lesion. Susceptibility artifact arising from ACDF hardware. Sinuses/Orbits: No mass or acute finding within the imaged orbits. Minimal mucosal thickening within the bilateral ethmoid and sphenoid sinuses. Other: Bilateral mastoid effusions (greater on the right). IMPRESSION: 1. No evidence of intracranial metastatic disease. 2. Known small chronic cortically-based infarcts within the bilateral PCA vascular territories. 3. Mild chronic small vessel ischemic changes within the cerebral white matter, slightly progressed from the prior brain MRI of 09/11/2015 4. Chronic lacunar infarct versus prominent perivascular space within the medial left thalamus, new from the prior MRI. 5. Redemonstrated tiny chronic infarct within the right cerebellar hemisphere. 6. Bilateral mastoid effusions. Electronically Signed   By: Kellie Simmering D.O.   On: 12/24/2021 18:03      IMPRESSION: At least stage IIIA (T3, N1, M0) non-small cell lung cancer, squamous cell carcinoma presented with large left lower lobe lung mass in addition to left hilar and suspicious subcarinal lymphadenopathy  ***  Today, I talked to the patient and family about the findings and work-up thus far.  We discussed the natural history of *** and general treatment, highlighting the role of radiotherapy in the management.  We discussed the available radiation techniques, and focused on the details of logistics and delivery.  We reviewed the anticipated acute and late sequelae associated with radiation in this setting.  The patient was encouraged to ask questions that I answered to the best of my ability. *** A patient consent form was discussed and signed.  We retained a copy for our records.  The patient would like to proceed with radiation and will be scheduled for  CT simulation.  PLAN: ***    *** minutes of total time was spent for this patient encounter, including preparation, face-to-face counseling with the patient and coordination of care, physical exam, and documentation of the encounter.   ------------------------------------------------  Blair Promise, PhD, MD  This document serves as a record of services personally performed by Gery Pray, MD. It was created on his behalf by Roney Mans, a trained medical scribe. The creation  of this record is based on the scribe's personal observations and the provider's statements to them. This document has been checked and approved by the attending provider.

## 2022-01-03 ENCOUNTER — Ambulatory Visit: Payer: Medicare Other | Admitting: Radiation Oncology

## 2022-01-03 ENCOUNTER — Ambulatory Visit: Payer: Medicare Other

## 2022-01-03 ENCOUNTER — Other Ambulatory Visit: Payer: Medicare Other

## 2022-01-03 ENCOUNTER — Inpatient Hospital Stay: Payer: Medicare Other

## 2022-01-03 ENCOUNTER — Ambulatory Visit
Admission: RE | Admit: 2022-01-03 | Discharge: 2022-01-03 | Disposition: A | Discharge status: Home / Self Care | Payer: Medicare Other | Source: Ambulatory Visit | Attending: Radiation Oncology | Admitting: Radiation Oncology

## 2022-01-03 ENCOUNTER — Other Ambulatory Visit: Payer: Self-pay

## 2022-01-03 ENCOUNTER — Encounter: Payer: Self-pay | Admitting: Radiation Oncology

## 2022-01-03 ENCOUNTER — Inpatient Hospital Stay: Payer: Medicare Other | Admitting: Physician Assistant

## 2022-01-03 VITALS — BP 143/79 | HR 70 | Temp 97.9°F | Resp 18 | Ht 70.0 in | Wt 138.2 lb

## 2022-01-03 VITALS — BP 138/95 | HR 50 | Temp 98.2°F | Resp 15 | Wt 138.7 lb

## 2022-01-03 DIAGNOSIS — Z79899 Other long term (current) drug therapy: Secondary | ICD-10-CM | POA: Diagnosis not present

## 2022-01-03 DIAGNOSIS — Z800 Family history of malignant neoplasm of digestive organs: Secondary | ICD-10-CM | POA: Insufficient documentation

## 2022-01-03 DIAGNOSIS — J432 Centrilobular emphysema: Secondary | ICD-10-CM | POA: Insufficient documentation

## 2022-01-03 DIAGNOSIS — Z87891 Personal history of nicotine dependence: Secondary | ICD-10-CM | POA: Insufficient documentation

## 2022-01-03 DIAGNOSIS — Z510 Encounter for antineoplastic radiation therapy: Secondary | ICD-10-CM | POA: Insufficient documentation

## 2022-01-03 DIAGNOSIS — Z5111 Encounter for antineoplastic chemotherapy: Secondary | ICD-10-CM | POA: Insufficient documentation

## 2022-01-03 DIAGNOSIS — K219 Gastro-esophageal reflux disease without esophagitis: Secondary | ICD-10-CM | POA: Insufficient documentation

## 2022-01-03 DIAGNOSIS — Z7901 Long term (current) use of anticoagulants: Secondary | ICD-10-CM | POA: Insufficient documentation

## 2022-01-03 DIAGNOSIS — Z86010 Personal history of colonic polyps: Secondary | ICD-10-CM | POA: Insufficient documentation

## 2022-01-03 DIAGNOSIS — G8929 Other chronic pain: Secondary | ICD-10-CM | POA: Insufficient documentation

## 2022-01-03 DIAGNOSIS — C3412 Malignant neoplasm of upper lobe, left bronchus or lung: Secondary | ICD-10-CM | POA: Diagnosis not present

## 2022-01-03 DIAGNOSIS — K449 Diaphragmatic hernia without obstruction or gangrene: Secondary | ICD-10-CM | POA: Insufficient documentation

## 2022-01-03 DIAGNOSIS — I6782 Cerebral ischemia: Secondary | ICD-10-CM | POA: Diagnosis not present

## 2022-01-03 DIAGNOSIS — C3432 Malignant neoplasm of lower lobe, left bronchus or lung: Secondary | ICD-10-CM | POA: Insufficient documentation

## 2022-01-03 DIAGNOSIS — Z8673 Personal history of transient ischemic attack (TIA), and cerebral infarction without residual deficits: Secondary | ICD-10-CM | POA: Insufficient documentation

## 2022-01-03 DIAGNOSIS — N200 Calculus of kidney: Secondary | ICD-10-CM | POA: Insufficient documentation

## 2022-01-03 DIAGNOSIS — I4891 Unspecified atrial fibrillation: Secondary | ICD-10-CM | POA: Diagnosis not present

## 2022-01-03 DIAGNOSIS — C770 Secondary and unspecified malignant neoplasm of lymph nodes of head, face and neck: Secondary | ICD-10-CM | POA: Diagnosis not present

## 2022-01-03 LAB — CBC WITH DIFFERENTIAL (CANCER CENTER ONLY)
Abs Immature Granulocytes: 0.03 10*3/uL (ref 0.00–0.07)
Basophils Absolute: 0.1 10*3/uL (ref 0.0–0.1)
Basophils Relative: 1 %
Eosinophils Absolute: 0.1 10*3/uL (ref 0.0–0.5)
Eosinophils Relative: 1 %
HCT: 35.7 % — ABNORMAL LOW (ref 39.0–52.0)
Hemoglobin: 12.3 g/dL — ABNORMAL LOW (ref 13.0–17.0)
Immature Granulocytes: 0 %
Lymphocytes Relative: 33 %
Lymphs Abs: 3.8 10*3/uL (ref 0.7–4.0)
MCH: 32 pg (ref 26.0–34.0)
MCHC: 34.5 g/dL (ref 30.0–36.0)
MCV: 93 fL (ref 80.0–100.0)
Monocytes Absolute: 0.9 10*3/uL (ref 0.1–1.0)
Monocytes Relative: 8 %
Neutro Abs: 6.6 10*3/uL (ref 1.7–7.7)
Neutrophils Relative %: 57 %
Platelet Count: 357 10*3/uL (ref 150–400)
RBC: 3.84 MIL/uL — ABNORMAL LOW (ref 4.22–5.81)
RDW: 14.6 % (ref 11.5–15.5)
WBC Count: 11.5 10*3/uL — ABNORMAL HIGH (ref 4.0–10.5)
nRBC: 0 % (ref 0.0–0.2)

## 2022-01-03 LAB — CMP (CANCER CENTER ONLY)
ALT: 6 U/L (ref 0–44)
AST: 10 U/L — ABNORMAL LOW (ref 15–41)
Albumin: 3.9 g/dL (ref 3.5–5.0)
Alkaline Phosphatase: 75 U/L (ref 38–126)
Anion gap: 6 (ref 5–15)
BUN: 15 mg/dL (ref 8–23)
CO2: 28 mmol/L (ref 22–32)
Calcium: 10 mg/dL (ref 8.9–10.3)
Chloride: 102 mmol/L (ref 98–111)
Creatinine: 0.78 mg/dL (ref 0.61–1.24)
GFR, Estimated: >60 mL/min (ref >60)
Glucose, Bld: 97 mg/dL (ref 70–99)
Potassium: 4.1 mmol/L (ref 3.5–5.1)
Sodium: 136 mmol/L (ref 135–145)
Total Bilirubin: 0.3 mg/dL (ref 0.3–1.2)
Total Protein: 8 g/dL (ref 6.5–8.1)

## 2022-01-04 ENCOUNTER — Other Ambulatory Visit: Payer: Self-pay

## 2022-01-05 DIAGNOSIS — C3432 Malignant neoplasm of lower lobe, left bronchus or lung: Secondary | ICD-10-CM | POA: Diagnosis not present

## 2022-01-05 DIAGNOSIS — C770 Secondary and unspecified malignant neoplasm of lymph nodes of head, face and neck: Secondary | ICD-10-CM | POA: Diagnosis not present

## 2022-01-05 DIAGNOSIS — C3412 Malignant neoplasm of upper lobe, left bronchus or lung: Secondary | ICD-10-CM | POA: Diagnosis not present

## 2022-01-05 DIAGNOSIS — Z510 Encounter for antineoplastic radiation therapy: Secondary | ICD-10-CM | POA: Diagnosis not present

## 2022-01-05 DIAGNOSIS — Z5111 Encounter for antineoplastic chemotherapy: Secondary | ICD-10-CM | POA: Diagnosis not present

## 2022-01-05 DIAGNOSIS — Z87891 Personal history of nicotine dependence: Secondary | ICD-10-CM | POA: Diagnosis not present

## 2022-01-07 MED FILL — Dexamethasone Sodium Phosphate Inj 100 MG/10ML: INTRAMUSCULAR | Qty: 1 | Status: AC

## 2022-01-10 ENCOUNTER — Ambulatory Visit: Payer: Medicare Other

## 2022-01-10 ENCOUNTER — Other Ambulatory Visit: Payer: Self-pay

## 2022-01-10 ENCOUNTER — Other Ambulatory Visit: Payer: Medicare Other

## 2022-01-10 ENCOUNTER — Ambulatory Visit: Payer: Medicare Other | Admitting: Physician Assistant

## 2022-01-10 ENCOUNTER — Inpatient Hospital Stay: Payer: Medicare Other

## 2022-01-10 ENCOUNTER — Ambulatory Visit
Admission: RE | Admit: 2022-01-10 | Discharge: 2022-01-10 | Disposition: A | Discharge status: Home / Self Care | Payer: Medicare Other | Source: Ambulatory Visit | Attending: Radiation Oncology | Admitting: Radiation Oncology

## 2022-01-10 ENCOUNTER — Other Ambulatory Visit: Payer: Self-pay | Admitting: Medical Oncology

## 2022-01-10 VITALS — BP 125/72 | HR 73 | Temp 98.7°F | Resp 18 | Ht 70.0 in | Wt 138.0 lb

## 2022-01-10 DIAGNOSIS — C3432 Malignant neoplasm of lower lobe, left bronchus or lung: Secondary | ICD-10-CM | POA: Diagnosis not present

## 2022-01-10 DIAGNOSIS — C3412 Malignant neoplasm of upper lobe, left bronchus or lung: Secondary | ICD-10-CM

## 2022-01-10 DIAGNOSIS — C770 Secondary and unspecified malignant neoplasm of lymph nodes of head, face and neck: Secondary | ICD-10-CM | POA: Diagnosis not present

## 2022-01-10 DIAGNOSIS — Z5111 Encounter for antineoplastic chemotherapy: Secondary | ICD-10-CM | POA: Diagnosis not present

## 2022-01-10 DIAGNOSIS — Z510 Encounter for antineoplastic radiation therapy: Secondary | ICD-10-CM | POA: Diagnosis not present

## 2022-01-10 DIAGNOSIS — Z87891 Personal history of nicotine dependence: Secondary | ICD-10-CM | POA: Diagnosis not present

## 2022-01-10 DIAGNOSIS — I878 Other specified disorders of veins: Secondary | ICD-10-CM

## 2022-01-10 LAB — CBC WITH DIFFERENTIAL (CANCER CENTER ONLY)
Abs Immature Granulocytes: 0.02 10*3/uL (ref 0.00–0.07)
Basophils Absolute: 0.1 10*3/uL (ref 0.0–0.1)
Basophils Relative: 1 %
Eosinophils Absolute: 0.1 10*3/uL (ref 0.0–0.5)
Eosinophils Relative: 1 %
HCT: 34.1 % — ABNORMAL LOW (ref 39.0–52.0)
Hemoglobin: 11.6 g/dL — ABNORMAL LOW (ref 13.0–17.0)
Immature Granulocytes: 0 %
Lymphocytes Relative: 30 %
Lymphs Abs: 3.2 10*3/uL (ref 0.7–4.0)
MCH: 31.8 pg (ref 26.0–34.0)
MCHC: 34 g/dL (ref 30.0–36.0)
MCV: 93.4 fL (ref 80.0–100.0)
Monocytes Absolute: 0.9 10*3/uL (ref 0.1–1.0)
Monocytes Relative: 8 %
Neutro Abs: 6.6 10*3/uL (ref 1.7–7.7)
Neutrophils Relative %: 60 %
Platelet Count: 339 10*3/uL (ref 150–400)
RBC: 3.65 MIL/uL — ABNORMAL LOW (ref 4.22–5.81)
RDW: 14.5 % (ref 11.5–15.5)
WBC Count: 10.9 10*3/uL — ABNORMAL HIGH (ref 4.0–10.5)
nRBC: 0 % (ref 0.0–0.2)

## 2022-01-10 LAB — CMP (CANCER CENTER ONLY)
ALT: 5 U/L (ref 0–44)
AST: 10 U/L — ABNORMAL LOW (ref 15–41)
Albumin: 3.8 g/dL (ref 3.5–5.0)
Alkaline Phosphatase: 73 U/L (ref 38–126)
Anion gap: 6 (ref 5–15)
BUN: 18 mg/dL (ref 8–23)
CO2: 26 mmol/L (ref 22–32)
Calcium: 9.7 mg/dL (ref 8.9–10.3)
Chloride: 106 mmol/L (ref 98–111)
Creatinine: 0.92 mg/dL (ref 0.61–1.24)
GFR, Estimated: >60 mL/min (ref >60)
Glucose, Bld: 109 mg/dL — ABNORMAL HIGH (ref 70–99)
Potassium: 4.4 mmol/L (ref 3.5–5.1)
Sodium: 138 mmol/L (ref 135–145)
Total Bilirubin: 0.4 mg/dL (ref 0.3–1.2)
Total Protein: 7.8 g/dL (ref 6.5–8.1)

## 2022-01-10 LAB — RAD ONC ARIA SESSION SUMMARY
Course Elapsed Days: 0
Plan Fractions Treated to Date: 1
Plan Prescribed Dose Per Fraction: 2 Gy
Plan Total Fractions Prescribed: 30
Plan Total Prescribed Dose: 60 Gy
Reference Point Dosage Given to Date: 2 Gy
Reference Point Session Dosage Given: 2 Gy
Session Number: 1

## 2022-01-10 MED ORDER — SODIUM CHLORIDE 0.9 % IV SOLN
45.0000 mg/m2 | Freq: Once | INTRAVENOUS | Status: AC
  Administered 2022-01-10: 78 mg via INTRAVENOUS
  Filled 2022-01-10: qty 13

## 2022-01-10 MED ORDER — SODIUM CHLORIDE 0.9 % IV SOLN
175.4000 mg | Freq: Once | INTRAVENOUS | Status: AC
  Administered 2022-01-10: 180 mg via INTRAVENOUS
  Filled 2022-01-10: qty 18

## 2022-01-10 MED ORDER — PALONOSETRON HCL INJECTION 0.25 MG/5ML
0.2500 mg | Freq: Once | INTRAVENOUS | Status: AC
  Administered 2022-01-10: 0.25 mg via INTRAVENOUS
  Filled 2022-01-10: qty 5

## 2022-01-10 MED ORDER — CETIRIZINE HCL 10 MG/ML IV SOLN
10.0000 mg | Freq: Once | INTRAVENOUS | Status: AC
  Administered 2022-01-10: 10 mg via INTRAVENOUS
  Filled 2022-01-10: qty 1

## 2022-01-10 MED ORDER — SODIUM CHLORIDE 0.9 % IV SOLN
10.0000 mg | Freq: Once | INTRAVENOUS | Status: AC
  Administered 2022-01-10: 10 mg via INTRAVENOUS
  Filled 2022-01-10: qty 1
  Filled 2022-01-10: qty 10

## 2022-01-10 MED ORDER — FAMOTIDINE IN NACL 20-0.9 MG/50ML-% IV SOLN
20.0000 mg | Freq: Once | INTRAVENOUS | Status: AC
  Administered 2022-01-10: 20 mg via INTRAVENOUS
  Filled 2022-01-10: qty 50

## 2022-01-10 MED ORDER — SODIUM CHLORIDE 0.9 % IV SOLN: Freq: Once | INTRAVENOUS | Status: AC

## 2022-01-10 NOTE — Patient Instructions (Signed)
[facility]  Discharge Instructions: Thank you for choosing [facility] to provide your oncology and hematology care.   If you have a lab appointment with the [facility], please go directly to the [facility] and check in at the registration area.   Wear comfortable clothing and clothing appropriate for easy access to any Portacath or PICC line.   We strive to give you quality time with your provider. You may need to reschedule your appointment if you arrive late (15 or more minutes).  Arriving late affects you and other patients whose appointments are after yours.  Also, if you miss three or more appointments without notifying the office, you may be dismissed from the clinic at the provider's discretion.      For prescription refill requests, have your pharmacy contact our office and allow 72 hours for refills to be completed.    Today you received the following chemotherapy and/or immunotherapy agents paclitaxel, carboplatin      To help prevent nausea and vomiting after your treatment, we encourage you to take your nausea medication as directed.  BELOW ARE SYMPTOMS THAT SHOULD BE REPORTED IMMEDIATELY: *FEVER GREATER THAN 100.4 F (38 C) OR HIGHER *CHILLS OR SWEATING *NAUSEA AND VOMITING THAT IS NOT CONTROLLED WITH YOUR NAUSEA MEDICATION *UNUSUAL SHORTNESS OF BREATH *UNUSUAL BRUISING OR BLEEDING *URINARY PROBLEMS (pain or burning when urinating, or frequent urination) *BOWEL PROBLEMS (unusual diarrhea, constipation, pain near the anus) TENDERNESS IN MOUTH AND THROAT WITH OR WITHOUT PRESENCE OF ULCERS (sore throat, sores in mouth, or a toothache) UNUSUAL RASH, SWELLING OR PAIN  UNUSUAL VAGINAL DISCHARGE OR ITCHING   Items with * indicate a potential emergency and should be followed up as soon as possible or go to the Emergency Department if any problems should occur.  Please show the CHEMOTHERAPY ALERT CARD or IMMUNOTHERAPY ALERT CARD at  check-in to the Emergency Department and triage nurse.  Should you have questions after your visit or need to cancel or reschedule your appointment, please contact [facility]  Dept: [phone]  and follow the prompts.  Office hours are 8:00 a.m. to 4:30 p.m. Monday - Friday. Please note that voicemails left after 4:00 p.m. may not be returned until the following business day.  We are closed weekends and major holidays. You have access to a nurse at all times for urgent questions. Please call the main number to the clinic Dept: [phone] and follow the prompts.   For any non-urgent questions, you may also contact your provider using MyChart. We now offer e-Visits for anyone 52 and older to request care online for non-urgent symptoms. For details visit [email or web address].   Also download the MyChart app! Go to the app store, search "MyChart", open the app, select [facility], and log in with your MyChart username and password.  Masks are optional in the cancer centers. If you would like for your care team to wear a mask while they are taking care of you, please let them know. You may have one support person who is at least 70 years old accompany you for your appointments.

## 2022-01-11 ENCOUNTER — Other Ambulatory Visit: Payer: Self-pay

## 2022-01-11 ENCOUNTER — Ambulatory Visit
Admission: RE | Admit: 2022-01-11 | Discharge: 2022-01-11 | Disposition: A | Discharge status: Home / Self Care | Payer: Medicare Other | Source: Ambulatory Visit | Attending: Radiation Oncology | Admitting: Radiation Oncology

## 2022-01-11 DIAGNOSIS — Z87891 Personal history of nicotine dependence: Secondary | ICD-10-CM | POA: Diagnosis not present

## 2022-01-11 DIAGNOSIS — Z510 Encounter for antineoplastic radiation therapy: Secondary | ICD-10-CM | POA: Diagnosis not present

## 2022-01-11 DIAGNOSIS — C3432 Malignant neoplasm of lower lobe, left bronchus or lung: Secondary | ICD-10-CM | POA: Diagnosis not present

## 2022-01-11 DIAGNOSIS — C770 Secondary and unspecified malignant neoplasm of lymph nodes of head, face and neck: Secondary | ICD-10-CM | POA: Diagnosis not present

## 2022-01-11 DIAGNOSIS — Z5111 Encounter for antineoplastic chemotherapy: Secondary | ICD-10-CM | POA: Diagnosis not present

## 2022-01-11 DIAGNOSIS — C3412 Malignant neoplasm of upper lobe, left bronchus or lung: Secondary | ICD-10-CM

## 2022-01-11 LAB — RAD ONC ARIA SESSION SUMMARY
Course Elapsed Days: 1
Plan Fractions Treated to Date: 2
Plan Prescribed Dose Per Fraction: 2 Gy
Plan Total Fractions Prescribed: 30
Plan Total Prescribed Dose: 60 Gy
Reference Point Dosage Given to Date: 4 Gy
Reference Point Session Dosage Given: 2 Gy
Session Number: 2

## 2022-01-11 MED ORDER — SONAFINE EX EMUL
1.0000 | Freq: Once | CUTANEOUS | Status: AC
  Administered 2022-01-11: 1 via TOPICAL

## 2022-01-12 ENCOUNTER — Ambulatory Visit
Admission: RE | Admit: 2022-01-12 | Discharge: 2022-01-12 | Disposition: A | Discharge status: Home / Self Care | Payer: Medicare Other | Source: Ambulatory Visit | Attending: Radiation Oncology | Admitting: Radiation Oncology

## 2022-01-12 ENCOUNTER — Other Ambulatory Visit: Payer: Self-pay

## 2022-01-12 DIAGNOSIS — Z87891 Personal history of nicotine dependence: Secondary | ICD-10-CM | POA: Diagnosis not present

## 2022-01-12 DIAGNOSIS — C3412 Malignant neoplasm of upper lobe, left bronchus or lung: Secondary | ICD-10-CM | POA: Insufficient documentation

## 2022-01-12 DIAGNOSIS — Z510 Encounter for antineoplastic radiation therapy: Secondary | ICD-10-CM | POA: Diagnosis not present

## 2022-01-12 LAB — RAD ONC ARIA SESSION SUMMARY
Course Elapsed Days: 2
Plan Fractions Treated to Date: 3
Plan Prescribed Dose Per Fraction: 2 Gy
Plan Total Fractions Prescribed: 30
Plan Total Prescribed Dose: 60 Gy
Reference Point Dosage Given to Date: 6 Gy
Reference Point Session Dosage Given: 2 Gy
Session Number: 3

## 2022-01-13 ENCOUNTER — Other Ambulatory Visit: Payer: Self-pay

## 2022-01-13 ENCOUNTER — Ambulatory Visit
Admission: RE | Admit: 2022-01-13 | Discharge: 2022-01-13 | Disposition: A | Discharge status: Home / Self Care | Payer: Medicare Other | Source: Ambulatory Visit | Attending: Radiation Oncology | Admitting: Radiation Oncology

## 2022-01-13 DIAGNOSIS — Z510 Encounter for antineoplastic radiation therapy: Secondary | ICD-10-CM | POA: Diagnosis not present

## 2022-01-13 DIAGNOSIS — C3412 Malignant neoplasm of upper lobe, left bronchus or lung: Secondary | ICD-10-CM | POA: Diagnosis not present

## 2022-01-13 DIAGNOSIS — Z87891 Personal history of nicotine dependence: Secondary | ICD-10-CM | POA: Diagnosis not present

## 2022-01-13 LAB — RAD ONC ARIA SESSION SUMMARY
Course Elapsed Days: 3
Plan Fractions Treated to Date: 4
Plan Prescribed Dose Per Fraction: 2 Gy
Plan Total Fractions Prescribed: 30
Plan Total Prescribed Dose: 60 Gy
Reference Point Dosage Given to Date: 8 Gy
Reference Point Session Dosage Given: 2 Gy
Session Number: 4

## 2022-01-14 ENCOUNTER — Ambulatory Visit
Admission: RE | Admit: 2022-01-14 | Discharge: 2022-01-14 | Disposition: A | Discharge status: Home / Self Care | Payer: Medicare Other | Source: Ambulatory Visit | Attending: Radiation Oncology | Admitting: Radiation Oncology

## 2022-01-14 ENCOUNTER — Other Ambulatory Visit: Payer: Self-pay

## 2022-01-14 DIAGNOSIS — C3412 Malignant neoplasm of upper lobe, left bronchus or lung: Secondary | ICD-10-CM | POA: Diagnosis not present

## 2022-01-14 DIAGNOSIS — Z87891 Personal history of nicotine dependence: Secondary | ICD-10-CM | POA: Diagnosis not present

## 2022-01-14 DIAGNOSIS — Z510 Encounter for antineoplastic radiation therapy: Secondary | ICD-10-CM | POA: Diagnosis not present

## 2022-01-14 LAB — RAD ONC ARIA SESSION SUMMARY
Course Elapsed Days: 4
Plan Fractions Treated to Date: 5
Plan Prescribed Dose Per Fraction: 2 Gy
Plan Total Fractions Prescribed: 30
Plan Total Prescribed Dose: 60 Gy
Reference Point Dosage Given to Date: 10 Gy
Reference Point Session Dosage Given: 2 Gy
Session Number: 5

## 2022-01-14 MED FILL — Dexamethasone Sodium Phosphate Inj 100 MG/10ML: INTRAMUSCULAR | Qty: 1 | Status: AC

## 2022-01-17 ENCOUNTER — Inpatient Hospital Stay: Payer: Medicare Other

## 2022-01-17 ENCOUNTER — Ambulatory Visit
Admission: RE | Admit: 2022-01-17 | Discharge: 2022-01-17 | Disposition: A | Discharge status: Home / Self Care | Payer: Medicare Other | Source: Ambulatory Visit | Attending: Radiation Oncology | Admitting: Radiation Oncology

## 2022-01-17 ENCOUNTER — Encounter: Payer: Self-pay | Admitting: Internal Medicine

## 2022-01-17 ENCOUNTER — Inpatient Hospital Stay (HOSPITAL_BASED_OUTPATIENT_CLINIC_OR_DEPARTMENT_OTHER): Payer: Medicare Other | Admitting: Internal Medicine

## 2022-01-17 ENCOUNTER — Other Ambulatory Visit: Payer: Medicare Other

## 2022-01-17 ENCOUNTER — Other Ambulatory Visit: Payer: Self-pay

## 2022-01-17 ENCOUNTER — Ambulatory Visit: Payer: Medicare Other

## 2022-01-17 VITALS — BP 93/72 | HR 92 | Temp 98.0°F | Wt 140.7 lb

## 2022-01-17 DIAGNOSIS — C3432 Malignant neoplasm of lower lobe, left bronchus or lung: Secondary | ICD-10-CM | POA: Insufficient documentation

## 2022-01-17 DIAGNOSIS — Z87891 Personal history of nicotine dependence: Secondary | ICD-10-CM | POA: Diagnosis not present

## 2022-01-17 DIAGNOSIS — C771 Secondary and unspecified malignant neoplasm of intrathoracic lymph nodes: Secondary | ICD-10-CM | POA: Insufficient documentation

## 2022-01-17 DIAGNOSIS — C3412 Malignant neoplasm of upper lobe, left bronchus or lung: Secondary | ICD-10-CM

## 2022-01-17 DIAGNOSIS — Z5111 Encounter for antineoplastic chemotherapy: Secondary | ICD-10-CM | POA: Insufficient documentation

## 2022-01-17 DIAGNOSIS — Z510 Encounter for antineoplastic radiation therapy: Secondary | ICD-10-CM | POA: Diagnosis not present

## 2022-01-17 LAB — CBC WITH DIFFERENTIAL (CANCER CENTER ONLY)
Abs Immature Granulocytes: 0.07 10*3/uL (ref 0.00–0.07)
Basophils Absolute: 0.1 10*3/uL (ref 0.0–0.1)
Basophils Relative: 1 %
Eosinophils Absolute: 0.1 10*3/uL (ref 0.0–0.5)
Eosinophils Relative: 1 %
HCT: 33 % — ABNORMAL LOW (ref 39.0–52.0)
Hemoglobin: 11.2 g/dL — ABNORMAL LOW (ref 13.0–17.0)
Immature Granulocytes: 1 %
Lymphocytes Relative: 20 %
Lymphs Abs: 1.8 10*3/uL (ref 0.7–4.0)
MCH: 31.8 pg (ref 26.0–34.0)
MCHC: 33.9 g/dL (ref 30.0–36.0)
MCV: 93.8 fL (ref 80.0–100.0)
Monocytes Absolute: 0.7 10*3/uL (ref 0.1–1.0)
Monocytes Relative: 8 %
Neutro Abs: 6.2 10*3/uL (ref 1.7–7.7)
Neutrophils Relative %: 69 %
Platelet Count: 336 10*3/uL (ref 150–400)
RBC: 3.52 MIL/uL — ABNORMAL LOW (ref 4.22–5.81)
RDW: 14.3 % (ref 11.5–15.5)
WBC Count: 8.9 10*3/uL (ref 4.0–10.5)
nRBC: 0 % (ref 0.0–0.2)

## 2022-01-17 LAB — RAD ONC ARIA SESSION SUMMARY
Course Elapsed Days: 7
Plan Fractions Treated to Date: 6
Plan Prescribed Dose Per Fraction: 2 Gy
Plan Total Fractions Prescribed: 30
Plan Total Prescribed Dose: 60 Gy
Reference Point Dosage Given to Date: 12 Gy
Reference Point Session Dosage Given: 2 Gy
Session Number: 6

## 2022-01-17 LAB — CMP (CANCER CENTER ONLY)
ALT: 6 U/L (ref 0–44)
AST: 10 U/L — ABNORMAL LOW (ref 15–41)
Albumin: 3.6 g/dL (ref 3.5–5.0)
Alkaline Phosphatase: 71 U/L (ref 38–126)
Anion gap: 6 (ref 5–15)
BUN: 22 mg/dL (ref 8–23)
CO2: 26 mmol/L (ref 22–32)
Calcium: 9.6 mg/dL (ref 8.9–10.3)
Chloride: 105 mmol/L (ref 98–111)
Creatinine: 0.84 mg/dL (ref 0.61–1.24)
GFR, Estimated: >60 mL/min (ref >60)
Glucose, Bld: 120 mg/dL — ABNORMAL HIGH (ref 70–99)
Potassium: 4.4 mmol/L (ref 3.5–5.1)
Sodium: 137 mmol/L (ref 135–145)
Total Bilirubin: 0.5 mg/dL (ref 0.3–1.2)
Total Protein: 7.5 g/dL (ref 6.5–8.1)

## 2022-01-17 MED ORDER — PALONOSETRON HCL INJECTION 0.25 MG/5ML
0.2500 mg | Freq: Once | INTRAVENOUS | Status: AC
  Administered 2022-01-17: 0.25 mg via INTRAVENOUS
  Filled 2022-01-17: qty 5

## 2022-01-17 MED ORDER — FAMOTIDINE IN NACL 20-0.9 MG/50ML-% IV SOLN
20.0000 mg | Freq: Once | INTRAVENOUS | Status: AC
  Administered 2022-01-17: 20 mg via INTRAVENOUS
  Filled 2022-01-17: qty 50

## 2022-01-17 MED ORDER — SODIUM CHLORIDE 0.9 % IV SOLN
45.0000 mg/m2 | Freq: Once | INTRAVENOUS | Status: AC
  Administered 2022-01-17: 78 mg via INTRAVENOUS
  Filled 2022-01-17: qty 13

## 2022-01-17 MED ORDER — SODIUM CHLORIDE 0.9 % IV SOLN
175.4000 mg | Freq: Once | INTRAVENOUS | Status: AC
  Administered 2022-01-17: 180 mg via INTRAVENOUS
  Filled 2022-01-17: qty 18

## 2022-01-17 MED ORDER — SODIUM CHLORIDE 0.9 % IV SOLN
10.0000 mg | Freq: Once | INTRAVENOUS | Status: AC
  Administered 2022-01-17: 10 mg via INTRAVENOUS
  Filled 2022-01-17: qty 10

## 2022-01-17 MED ORDER — SODIUM CHLORIDE 0.9 % IV SOLN: Freq: Once | INTRAVENOUS | Status: AC

## 2022-01-17 MED ORDER — CETIRIZINE HCL 10 MG/ML IV SOLN
10.0000 mg | Freq: Once | INTRAVENOUS | Status: AC
  Administered 2022-01-17: 10 mg via INTRAVENOUS
  Filled 2022-01-17: qty 1

## 2022-01-17 NOTE — Patient Instructions (Signed)
[facility]  Discharge Instructions: Thank you for choosing [facility] to provide your oncology and hematology care.   If you have a lab appointment with the [facility], please go directly to the [facility] and check in at the registration area.   Wear comfortable clothing and clothing appropriate for easy access to any Portacath or PICC line.   We strive to give you quality time with your provider. You may need to reschedule your appointment if you arrive late (15 or more minutes).  Arriving late affects you and other patients whose appointments are after yours.  Also, if you miss three or more appointments without notifying the office, you may be dismissed from the clinic at the provider's discretion.      For prescription refill requests, have your pharmacy contact our office and allow 72 hours for refills to be completed.    Today you received the following chemotherapy and/or immunotherapy agents: paclitaxel and carboplatin      To help prevent nausea and vomiting after your treatment, we encourage you to take your nausea medication as directed.  BELOW ARE SYMPTOMS THAT SHOULD BE REPORTED IMMEDIATELY: *FEVER GREATER THAN 100.4 F (38 C) OR HIGHER *CHILLS OR SWEATING *NAUSEA AND VOMITING THAT IS NOT CONTROLLED WITH YOUR NAUSEA MEDICATION *UNUSUAL SHORTNESS OF BREATH *UNUSUAL BRUISING OR BLEEDING *URINARY PROBLEMS (pain or burning when urinating, or frequent urination) *BOWEL PROBLEMS (unusual diarrhea, constipation, pain near the anus) TENDERNESS IN MOUTH AND THROAT WITH OR WITHOUT PRESENCE OF ULCERS (sore throat, sores in mouth, or a toothache) UNUSUAL RASH, SWELLING OR PAIN  UNUSUAL VAGINAL DISCHARGE OR ITCHING   Items with * indicate a potential emergency and should be followed up as soon as possible or go to the Emergency Department if any problems should occur.  Please show the CHEMOTHERAPY ALERT CARD or IMMUNOTHERAPY ALERT  CARD at check-in to the Emergency Department and triage nurse.  Should you have questions after your visit or need to cancel or reschedule your appointment, please contact [facility]  Dept: [phone]  and follow the prompts.  Office hours are 8:00 a.m. to 4:30 p.m. Monday - Friday. Please note that voicemails left after 4:00 p.m. may not be returned until the following business day.  We are closed weekends and major holidays. You have access to a nurse at all times for urgent questions. Please call the main number to the clinic Dept: [phone] and follow the prompts.   For any non-urgent questions, you may also contact your provider using MyChart. We now offer e-Visits for anyone 29 and older to request care online for non-urgent symptoms. For details visit [email or web address].   Also download the MyChart app! Go to the app store, search "MyChart", open the app, select [facility], and log in with your MyChart username and password.  Masks are optional in the cancer centers. If you would like for your care team to wear a mask while they are taking care of you, please let them know. You may have one support person who is at least 70 years old accompany you for your appointments.

## 2022-01-17 NOTE — Progress Notes (Signed)
[facility] [phone]   [phone]  OFFICE PROGRESS NOTE  Dettinger, [name], MD [place] [address]  DIAGNOSIS: Stage IIIB (T3, N2, M0) non-small cell lung cancer, squamous cell carcinoma presented with large left lower lobe lung mass in addition subcarinal and AP window nodal metastases. Diagnosed in September 2023.    PRIOR THERAPY: None   CURRENT THERAPY: Concurrent chemo/radiation with carboplatin for an AUC of 2 and paclitaxel 45 mg/m2 weekly. First dose expected 01/10/22.  Status post 1 cycle.  INTERVAL HISTORY: Cameron Roman 71 y.o. male returns to the clinic today for follow-up visit.  The patient is feeling fine today with no concerning complaints except for shortness of breath that is worse in the morning.  He denied having any current chest pain, but has dry cough with no hemoptysis.  He has no nausea, vomiting, diarrhea or constipation.  He has no headache or visual changes.  He denied having any recent weight loss or night sweats.  He is here today for evaluation before starting cycle #2 of his treatment.  MEDICAL HISTORY: Past Medical History:  Diagnosis Date   Anxiety    Arthritis    Atrial fibrillation (HCC)    Chronic back pain    buldging disc   Chronic neck pain    ruptured disc   Cough    hx smoking   Diverticulosis    Esophageal stricture    GERD (gastroesophageal reflux disease)    takes Prilosec daily   H/O hiatal hernia    Hearing loss    Hepatitis-C 2017   treated and cured per patient   Hiatal hernia    History of colon polyps    Insomnia    takes Elavil nightly   Lipoma of abdominal wall 02/11/2011   Lung cancer ([facility])    Stroke (HCC)    x 2   Type 2 diabetes mellitus ([facility]) 07/22/2020   patient denies DM, per MD note pt is pre-diabetic    ALLERGIES:  is allergic to percocet [oxycodone-acetaminophen].  MEDICATIONS:  Current Outpatient Medications  Medication Sig Dispense Refill    apixaban (ELIQUIS) 5 MG TABS tablet TAKE ONE TABLET BY MOUTH TWICE DAILY 60 tablet 5   fluticasone (FLONASE) 50 MCG/ACT nasal spray Place 1 spray into the nose in the morning.     gabapentin (NEURONTIN) 300 MG capsule Take 1 capsule (300 mg total) by mouth 3 (three) times daily. Take one capsule twice to three times daily (Patient taking differently: Take 300 mg by mouth 2 (two) times daily.) 270 capsule 3   Omega-3 Fatty Acids (FISH OIL) 1000 MG CAPS Take 1,000 mg by mouth in the morning and at bedtime.      omeprazole (PRILOSEC) 20 MG capsule Take 1 capsule (20 mg total) by mouth daily. 90 capsule 3   pantoprazole (PROTONIX) 40 MG tablet Take 1 tablet (40 mg total) by mouth daily. 90 tablet 3   pravastatin (PRAVACHOL) 20 MG tablet Take 1 tablet (20 mg total) by mouth daily. Take one tablet once daily (Patient taking differently: Take 20 mg by mouth at bedtime. Take one tablet once daily) 90 tablet 3   prochlorperazine (COMPAZINE) 10 MG tablet Take 1 tablet (10 mg total) by mouth every 6 (six) hours as needed for nausea or vomiting. 30 tablet 1   tamsulosin (FLOMAX) 0.4 MG CAPS capsule Take 1 capsule (0.4 mg total) by mouth daily. 90 capsule 3  Current Facility-Administered Medications  Medication Dose Route Frequency Provider Last Rate Last Admin   methylPREDNISolone acetate (DEPO-MEDROL) injection 80 mg  80 mg Intramuscular Once Dettinger, [name], MD        SURGICAL HISTORY:  Past Surgical History:  Procedure Laterality Date   ANTERIOR CERVICAL DECOMP/DISCECTOMY FUSION  04/12/2011   Procedure: ANTERIOR CERVICAL DECOMPRESSION/DISCECTOMY FUSION 2 LEVELS;  Surgeon: Peggyann Shoals, MD;  Location: [facility];  Service: Neurosurgery;  Laterality: N/A;  exploration of Cervical four - seven  fusion with redo Cervical six- seven, cervical three- four anterior cervical decompression with fusion interbody prothesis plating and bonegraft and C34 anterior cervical decompression with inte    APPENDECTOMY     at age 63   BRONCHIAL BIOPSY  11/30/2021   Procedure: BRONCHIAL BIOPSIES;  Surgeon: Garner Nash, DO;  Location: [facility] END[facility];  Service: Pulmonary;;   BRONCHIAL BRUSHINGS  11/30/2021   Procedure: BRONCHIAL BRUSHINGS;  Surgeon: Garner Nash, DO;  Location: [facility];  Service: Pulmonary;;   CARDIAC CATHETERIZATION  06/03/2004   COLONOSCOPY     EP IMPLANTABLE DEVICE N/A 09/14/2015   Procedure: Loop Recorder Insertion;  Surgeon: Evans Lance, MD;  Location: [facility] CV LAB;  Service: Cardiovascular;  Laterality: N/A;   FINE NEEDLE ASPIRATION  11/30/2021   Procedure: FINE NEEDLE ASPIRATION (FNA) LINEAR;  Surgeon: Garner Nash, DO;  Location: [facility] END[facility];  Service: Pulmonary;;   HAND SURGERY Right 03/15/2003   right x 3   INNER EAR SURGERY Bilateral    bil;titanium in both ears   lower back surgery  03/15/2007   had plates and screws inserted   NECK SURGERY  03/15/2007   had insertion of  plates and screws   TEE WITHOUT CARDIOVERSION N/A 09/14/2015   Procedure: TRANSESOPHAGEAL ECHOCARDIOGRAM (TEE);  Surgeon: Thayer Headings, MD;  Location: [facility];  Service: Cardiovascular;  Laterality: N/A;   TRANSFORAMINAL LUMBAR INTERBODY FUSION (TLIF) WITH PEDICLE SCREW FIXATION 1 LEVEL Left 09/27/2019   Procedure: Left Lumbar 5 Sacral 1 Transforaminal lumbar interbody fusion with exploration/removal of adjacent level hardware;  Surgeon: Erline Levine, MD;  Location: [facility];  Service: Neurosurgery;  Laterality: Left;  3C/RM 19   VIDEO BRONCHOSCOPY WITH ENDOBRONCHIAL ULTRASOUND N/A 11/30/2021   Procedure: VIDEO BRONCHOSCOPY WITH ENDOBRONCHIAL ULTRASOUND;  Surgeon: Garner Nash, DO;  Location: [facility];  Service: Pulmonary;  Laterality: N/A;    REVIEW OF SYSTEMS:  A comprehensive review of systems was negative except for: Respiratory: positive for cough and dyspnea on exertion   PHYSICAL EXAMINATION: General appearance: alert, cooperative, fatigued, and no  distress Head: Normocephalic, without obvious abnormality, atraumatic Neck: no adenopathy, no JVD, supple, symmetrical, trachea midline, and thyroid not enlarged, symmetric, no tenderness/mass/nodules Lymph nodes: Cervical, supraclavicular, and axillary nodes normal. Resp: clear to auscultation bilaterally Back: symmetric, no curvature. ROM normal. No CVA tenderness. Cardio: regular rate and rhythm, S1, S2 normal, no murmur, click, rub or gallop GI: soft, non-tender; bowel sounds normal; no masses,  no organomegaly Extremities: extremities normal, atraumatic, no cyanosis or edema  ECOG PERFORMANCE STATUS: 1 - Symptomatic but completely ambulatory  Blood pressure 93/72, pulse 92, temperature 98 F (36.7 C), weight 140 lb 11.2 oz (63.8 kg), SpO2 100 %.  LABORATORY DATA: Lab Results  Component Value Date   WBC 8.9 01/17/2022   HGB 11.2 (L) 01/17/2022   HCT 33.0 (L) 01/17/2022   MCV 93.8 01/17/2022   PLT 336 01/17/2022      Chemistry  Component Value Date/Time   NA 137 01/17/2022 0820   NA 140 08/30/2021 1659   K 4.4 01/17/2022 0820   CL 105 01/17/2022 0820   CO2 26 01/17/2022 0820   BUN 22 01/17/2022 0820   BUN 17 08/30/2021 1659   CREATININE 0.84 01/17/2022 0820   CREATININE 0.80 08/30/2012 0955      Component Value Date/Time   CALCIUM 9.6 01/17/2022 0820   ALKPHOS 71 01/17/2022 0820   AST 10 (L) 01/17/2022 0820   ALT 6 01/17/2022 0820   BILITOT 0.5 01/17/2022 0820       RADIOGRAPHIC STUDIES: NM PET Image Initial (PI) Skull Base To Thigh (F-18 FDG)  Result Date: 01/01/2022 CLINICAL DATA:  Initial treatment strategy for left lung non-small-cell carcinoma. EXAM: NUCLEAR MEDICINE PET SKULL BASE TO THIGH TECHNIQUE: 7.6 mCi F-18 FDG was injected intravenously. Full-ring PET imaging was performed from the skull base to thigh after the radiotracer. CT data was obtained and used for attenuation correction and anatomic localization. Fasting blood glucose: 94 mg/dl  COMPARISON:  Chest CT on 11/03/2021 FINDINGS: Mediastinal blood-pool activity (background): SUV max = 1.9 Liver activity (reference): SUV max = N/A NECK:  No hypermetabolic lymph nodes or masses. Incidental CT findings:  None. CHEST: 6.4 x 4.1 cm mass with irregular margins is again seen in superior and medial left lower lobe, which involves right hilum. This shows intense hypermetabolism, with SUV max of 18.5. 10 mm mediastinal lymph node in the AP window shows mild hypermetabolism, with SUV max of 2.6. 12 mm subcarinal lymph node shows mild hypermetabolism, with SUV max of 3.0. Incidental CT findings: Moderate centrilobular emphysema. No evidence of pleural effusion. Aortic and coronary atherosclerotic calcification incidentally noted. ABDOMEN/PELVIS: No abnormal hypermetabolic activity within the liver, pancreas, adrenal glands, or spleen. No hypermetabolic lymph nodes in the abdomen or pelvis. Incidental CT findings: 5 mm nonobstructing calculus in the lower pole of right kidney. Aortic atherosclerotic calcification incidentally noted. SKELETON: No focal hypermetabolic bone lesions to suggest skeletal metastasis. Incidental CT findings:  None. IMPRESSION: 6.4 cm hypermetabolic mass in left lower lobe involvement of the left hilum, consistent with primary bronchogenic carcinoma. Small mediastinal lymph nodes in AP window and subcarinal region show mild hypermetabolic activity, suspicious for lymph node metastases. Evidence of distant metastatic disease. Aortic Atherosclerosis (ICD10-I70.0) and Emphysema (ICD10-J43.9). Electronically Signed   By: Marlaine Hind M.D.   On: 01/01/2022 14:13   MR BRAIN W WO CONTRAST  Result Date: 12/24/2021 CLINICAL DATA:  Provided history: Malignant neoplasm of unspecified part of unspecified bronchus or lung. Non-small cell lung cancer, staging. EXAM: MRI HEAD WITHOUT AND WITH CONTRAST TECHNIQUE: Multiplanar, multiecho pulse sequences of the brain and surrounding structures were  obtained without and with intravenous contrast. CONTRAST:  75mL GADAVIST GADOBUTROL 1 MMOL/ML IV SOLN COMPARISON:  Brain MRI 09/11/2015. MRA head 09/11/2015. FINDINGS: Brain: Mild generalized cerebral atrophy. Redemonstrated small chronic cortically-based infarcts within the left parietal and occipital lobes (left PCA vascular territory). A small chronic cortical infarct within the right occipital lobe (right PCA vascular territory) was better appreciated on the prior brain MRI of 09/11/2015. Mild multifocal T2 FLAIR hyperintense signal abnormality within the cerebral white matter, nonspecific but compatible with chronic small vessel ischemic disease. Chronic lacunar infarct versus prominent perivascular space within the medial left thalamus, new from the prior MRI. Tiny chronic infarct within the right cerebellar hemisphere. There is no acute infarct. No evidence of an intracranial mass. No chronic intracranial blood products. No extra-axial fluid collection. No midline  shift. No pathologic intracranial enhancement identified. Vascular: Maintained flow voids within the proximal large arterial vessels. Skull and upper cervical spine: No focal suspicious marrow lesion. Susceptibility artifact arising from ACDF hardware. Sinuses/Orbits: No mass or acute finding within the imaged orbits. Minimal mucosal thickening within the bilateral ethmoid and sphenoid sinuses. Other: Bilateral mastoid effusions (greater on the right). IMPRESSION: 1. No evidence of intracranial metastatic disease. 2. Known small chronic cortically-based infarcts within the bilateral PCA vascular territories. 3. Mild chronic small vessel ischemic changes within the cerebral white matter, slightly progressed from the prior brain MRI of 09/11/2015 4. Chronic lacunar infarct versus prominent perivascular space within the medial left thalamus, new from the prior MRI. 5. Redemonstrated tiny chronic infarct within the right cerebellar hemisphere. 6.  Bilateral mastoid effusions. Electronically Signed   By: Kellie Simmering D.O.   On: 12/24/2021 18:03    ASSESSMENT AND PLAN: This is a very pleasant 71 years old white male diagnosed with a stage IIIb (T3, N2, M0) non-small cell lung cancer, squamous cell carcinoma presented with large left lower lobe lung mass in addition to subcarinal and AP window lymphadenopathy diagnosed in September 2023.  The patient is currently undergoing a course of concurrent chemoradiation with weekly carboplatin and paclitaxel status post 1 cycle of the chemotherapy.  He missed his treatment last week. I recommended for the patient to proceed with cycle #2 today as planned. I will see him back for follow-up visit in 2 weeks for evaluation before starting cycle #4. The patient was advised to call immediately if he has any other concerning symptoms in the interval. The patient voices understanding of current disease status and treatment options and is in agreement with the current care plan.  All questions were answered. The patient knows to call the clinic with any problems, questions or concerns. We can certainly see the patient much sooner if necessary.  The total time spent in the appointment was 20 minutes.  Disclaimer: This note was dictated with voice recognition software. Similar sounding words can inadvertently be transcribed and may not be corrected upon review.

## 2022-01-18 ENCOUNTER — Other Ambulatory Visit: Payer: Self-pay

## 2022-01-18 ENCOUNTER — Ambulatory Visit
Admission: RE | Admit: 2022-01-18 | Discharge: 2022-01-18 | Disposition: A | Discharge status: Home / Self Care | Payer: Medicare Other | Source: Ambulatory Visit | Attending: Radiation Oncology | Admitting: Radiation Oncology

## 2022-01-18 DIAGNOSIS — C3412 Malignant neoplasm of upper lobe, left bronchus or lung: Secondary | ICD-10-CM | POA: Diagnosis not present

## 2022-01-18 DIAGNOSIS — Z87891 Personal history of nicotine dependence: Secondary | ICD-10-CM | POA: Diagnosis not present

## 2022-01-18 DIAGNOSIS — Z510 Encounter for antineoplastic radiation therapy: Secondary | ICD-10-CM | POA: Diagnosis not present

## 2022-01-18 LAB — RAD ONC ARIA SESSION SUMMARY
Course Elapsed Days: 8
Plan Fractions Treated to Date: 7
Plan Prescribed Dose Per Fraction: 2 Gy
Plan Total Fractions Prescribed: 30
Plan Total Prescribed Dose: 60 Gy
Reference Point Dosage Given to Date: 14 Gy
Reference Point Session Dosage Given: 2 Gy
Session Number: 7

## 2022-01-19 ENCOUNTER — Telehealth: Payer: Self-pay | Admitting: Radiation Oncology

## 2022-01-19 ENCOUNTER — Ambulatory Visit: Payer: Medicare Other

## 2022-01-19 NOTE — Telephone Encounter (Signed)
Patient called to cancel 11/8 treatment due to being stuck in traffic. L4 notified.

## 2022-01-20 ENCOUNTER — Other Ambulatory Visit: Payer: Self-pay

## 2022-01-20 ENCOUNTER — Other Ambulatory Visit: Payer: Self-pay | Admitting: Internal Medicine

## 2022-01-20 ENCOUNTER — Ambulatory Visit
Admission: RE | Admit: 2022-01-20 | Discharge: 2022-01-20 | Disposition: A | Discharge status: Home / Self Care | Payer: Medicare Other | Source: Ambulatory Visit | Attending: Radiation Oncology | Admitting: Radiation Oncology

## 2022-01-20 ENCOUNTER — Telehealth: Payer: Self-pay | Admitting: Physician Assistant

## 2022-01-20 DIAGNOSIS — R918 Other nonspecific abnormal finding of lung field: Secondary | ICD-10-CM

## 2022-01-20 DIAGNOSIS — C3412 Malignant neoplasm of upper lobe, left bronchus or lung: Secondary | ICD-10-CM | POA: Diagnosis not present

## 2022-01-20 DIAGNOSIS — Z87891 Personal history of nicotine dependence: Secondary | ICD-10-CM | POA: Diagnosis not present

## 2022-01-20 DIAGNOSIS — Z510 Encounter for antineoplastic radiation therapy: Secondary | ICD-10-CM | POA: Diagnosis not present

## 2022-01-20 LAB — RAD ONC ARIA SESSION SUMMARY
Course Elapsed Days: 10
Plan Fractions Treated to Date: 8
Plan Prescribed Dose Per Fraction: 2 Gy
Plan Total Fractions Prescribed: 30
Plan Total Prescribed Dose: 60 Gy
Reference Point Dosage Given to Date: 16 Gy
Reference Point Session Dosage Given: 2 Gy
Session Number: 8

## 2022-01-20 NOTE — Progress Notes (Signed)
[facility] OFFICE PROGRESS NOTE  Dettinger, [name], MD [place] [address]  DIAGNOSIS: Stage IIIB (T3, N2, M0) non-small cell lung cancer, squamous cell carcinoma presented with large left lower lobe lung mass in addition subcarinal and AP window nodal metastases. Diagnosed in September 2023.    PRIOR THERAPY: None  CURRENT THERAPY: Concurrent chemo/radiation with carboplatin for an AUC of 2 and paclitaxel 45 mg/m2 weekly. First dose expected 01/10/22. Status post 2 cycles.   INTERVAL HISTORY: Chistian Kasler 71 y.o. male returns to the clinic today for a follow-up visit.  The patient reports concerning complaints of constant chest pain/burning and back/neck pain which has predated his malignancy. He sees Dr. Collene Mares from pain management. He is prescribed gabapentin and hydrocodone every 8 hours. He is not interested in taking any additional medications because he does not like taking medications. He states that his pain is not any worse than his baseline. Dr. Julien Nordmann feels his discomfort could be secondary to his mediastinal invasion.  Patient is currently undergoing a course of concurrent chemoradiation.  He is status post 2 weeks of treatment and he is so far tolerated except for some fatigue a few days following treatment.  His last day radiation is tentatively scheduled for 02/23/2022.   Today he denies any fever, chills, drenching night sweats, weight loss. He is snacking in between meals. He does not like drinking water.  This is continued and he reports a constant chest burning/hurting sensation. He denies difficulty swallowing. He saw Dr. Henrene Pastor from GI who prescribed protonix. He denies dysphagia or odynophagia. He has shortness of breath with exertion has somewhat worsening.  Also worsening dry cough. He takes cherry cough drops which improves his cough. The cough is not constant and comes and goes throughout the day. Denies hemoptysis.  Denies any nausea,  vomiting, diarrhea, or constipation.  Denies any headache or visual changes.    He is here today for evaluation and repeat blood work before undergoing cycle #3.   MEDICAL HISTORY: Past Medical History:  Diagnosis Date   Anxiety    Arthritis    Atrial fibrillation (HCC)    Chronic back pain    buldging disc   Chronic neck pain    ruptured disc   Cough    hx smoking   Diverticulosis    Esophageal stricture    GERD (gastroesophageal reflux disease)    takes Prilosec daily   H/O hiatal hernia    Hearing loss    Hepatitis-C 2017   treated and cured per patient   Hiatal hernia    History of colon polyps    Insomnia    takes Elavil nightly   Lipoma of abdominal wall 02/11/2011   Lung cancer ([facility])    Stroke (HCC)    x 2   Type 2 diabetes mellitus ([facility]) 07/22/2020   patient denies DM, per MD note pt is pre-diabetic    ALLERGIES:  is allergic to percocet [oxycodone-acetaminophen].  MEDICATIONS:  Current Outpatient Medications  Medication Sig Dispense Refill   apixaban (ELIQUIS) 5 MG TABS tablet TAKE ONE TABLET BY MOUTH TWICE DAILY 60 tablet 5   fluticasone (FLONASE) 50 MCG/ACT nasal spray Place 1 spray into the nose in the morning.     gabapentin (NEURONTIN) 300 MG capsule Take 1 capsule (300 mg total) by mouth 3 (three) times daily. Take one capsule twice to three times daily (Patient taking differently: Take 300 mg by mouth 2 (two) times daily.)  270 capsule 3   Omega-3 Fatty Acids (FISH OIL) 1000 MG CAPS Take 1,000 mg by mouth in the morning and at bedtime.      omeprazole (PRILOSEC) 20 MG capsule Take 1 capsule (20 mg total) by mouth daily. 90 capsule 3   pantoprazole (PROTONIX) 40 MG tablet Take 1 tablet (40 mg total) by mouth daily. 90 tablet 3   pravastatin (PRAVACHOL) 20 MG tablet Take 1 tablet (20 mg total) by mouth daily. Take one tablet once daily (Patient taking differently: Take 20 mg by mouth at bedtime. Take one tablet once daily) 90 tablet 3   prochlorperazine  (COMPAZINE) 10 MG tablet Take 1 tablet (10 mg total) by mouth every 6 (six) hours as needed for nausea or vomiting. 30 tablet 1   tamsulosin (FLOMAX) 0.4 MG CAPS capsule Take 1 capsule (0.4 mg total) by mouth daily. 90 capsule 3   Current Facility-Administered Medications  Medication Dose Route Frequency Provider Last Rate Last Admin   methylPREDNISolone acetate (DEPO-MEDROL) injection 80 mg  80 mg Intramuscular Once Dettinger, [name], MD       Facility-Administered Medications Ordered in Other Visits  Medication Dose Route Frequency Provider Last Rate Last Admin   0.9 %  sodium chloride infusion   Intravenous Once Curt Bears, MD       CARBOplatin (PARAPLATIN) 180 mg in sodium chloride 0.9 % 100 mL chemo infusion  180 mg Intravenous Once Curt Bears, MD       cetirizine Ambrose Mantle) injection 10 mg  10 mg Intravenous Once Curt Bears, MD       dexamethasone (DECADRON) 10 mg in sodium chloride 0.9 % 50 mL IVPB  10 mg Intravenous Once Curt Bears, MD       famotidine (PEPCID) IVPB 20 mg premix  20 mg Intravenous Once Curt Bears, MD       PACLitaxel (TAXOL) 78 mg in sodium chloride 0.9 % 250 mL chemo infusion (</= 80mg /m2)  45 mg/m2 (Treatment Plan Recorded) Intravenous Once Curt Bears, MD       palonosetron (ALOXI) injection 0.25 mg  0.25 mg Intravenous Once Curt Bears, MD        SURGICAL HISTORY:  Past Surgical History:  Procedure Laterality Date   ANTERIOR CERVICAL DECOMP/DISCECTOMY FUSION  04/12/2011   Procedure: ANTERIOR CERVICAL DECOMPRESSION/DISCECTOMY FUSION 2 LEVELS;  Surgeon: Peggyann Shoals, MD;  Location: [facility];  Service: Neurosurgery;  Laterality: N/A;  exploration of Cervical four - seven  fusion with redo Cervical six- seven, cervical three- four anterior cervical decompression with fusion interbody prothesis plating and bonegraft and C34 anterior cervical decompression with inte   APPENDECTOMY     at age 60   BRONCHIAL BIOPSY   11/30/2021   Procedure: BRONCHIAL BIOPSIES;  Surgeon: Garner Nash, DO;  Location: [facility];  Service: Pulmonary;;   BRONCHIAL BRUSHINGS  11/30/2021   Procedure: BRONCHIAL BRUSHINGS;  Surgeon: Garner Nash, DO;  Location: [facility];  Service: Pulmonary;;   CARDIAC CATHETERIZATION  06/03/2004   COLONOSCOPY     EP IMPLANTABLE DEVICE N/A 09/14/2015   Procedure: Loop Recorder Insertion;  Surgeon: Evans Lance, MD;  Location: [facility];  Service: Cardiovascular;  Laterality: N/A;   FINE NEEDLE ASPIRATION  11/30/2021   Procedure: FINE NEEDLE ASPIRATION (FNA) LINEAR;  Surgeon: Garner Nash, DO;  Location: [facility];  Service: Pulmonary;;   HAND SURGERY Right 03/15/2003   right x 3   INNER EAR SURGERY Bilateral    bil;titanium  in both ears   lower back surgery  03/15/2007   had plates and screws inserted   NECK SURGERY  03/15/2007   had insertion of  plates and screws   TEE WITHOUT CARDIOVERSION N/A 09/14/2015   Procedure: TRANSESOPHAGEAL ECHOCARDIOGRAM (TEE);  Surgeon: Thayer Headings, MD;  Location: [facility];  Service: Cardiovascular;  Laterality: N/A;   TRANSFORAMINAL LUMBAR INTERBODY FUSION (TLIF) WITH PEDICLE SCREW FIXATION 1 LEVEL Left 09/27/2019   Procedure: Left Lumbar 5 Sacral 1 Transforaminal lumbar interbody fusion with exploration/removal of adjacent level hardware;  Surgeon: Erline Levine, MD;  Location: [facility];  Service: Neurosurgery;  Laterality: Left;  3C/RM 19   VIDEO BRONCHOSCOPY WITH ENDOBRONCHIAL ULTRASOUND N/A 11/30/2021   Procedure: VIDEO BRONCHOSCOPY WITH ENDOBRONCHIAL ULTRASOUND;  Surgeon: Garner Nash, DO;  Location: [facility];  Service: Pulmonary;  Laterality: N/A;    REVIEW OF SYSTEMS:   Review of Systems  Constitutional: Positive for mild fatigue following treatment. Negative for appetite change, chills, fever and unexpected weight change.  HENT:   Negative for mouth sores, nosebleeds, sore throat and trouble swallowing.    Eyes: Negative for eye problems and icterus.  Respiratory: Negative for hemoptysis, Pos for intermittent cough and shortness of breath with exertion. Cardiovascular: Pos for chest burning Gastrointestinal: Negative for abdominal pain, constipation, diarrhea, nausea and vomiting.  Genitourinary: Negative for bladder incontinence, difficulty urinating, dysuria, frequency and hematuria.   Musculoskeletal: Negative for gait problem, neck pain and neck stiffness. Pos for chronic back pain Skin: Negative for itching and rash.  Neurological: Negative for dizziness, extremity weakness, gait problem, headaches, light-headedness and seizures.  Hematological: Negative for adenopathy. Does not bruise/bleed easily.  Psychiatric/Behavioral: Negative for confusion, depression and sleep disturbance. The patient is not nervous/anxious.     PHYSICAL EXAMINATION:  Blood pressure 113/68, pulse 88, temperature 98.2 F (36.8 C), temperature source Oral, resp. rate 14, weight 141 lb 8 oz (64.2 kg), SpO2 100 %.  ECOG PERFORMANCE STATUS: 1  Physical Exam  Constitutional: Oriented to person, place, and time and thin appearing male and in no distress. No distress.  HENT:  Head: Normocephalic and atraumatic.  Mouth/Throat: Oropharynx is clear and moist. No oropharyngeal exudate.  Eyes: Conjunctivae are normal. Right eye exhibits no discharge. Left eye exhibits no discharge. No scleral icterus.  Neck: Normal range of motion. Neck supple.  Cardiovascular: Normal rate, regular rhythm, normal heart sounds and intact distal pulses.   Pulmonary/Chest: Effort normal. Positive for decreased breath sounds bilaterally. No respiratory distress. No wheezes. No rales.  Abdominal: Exhibits no distension  Musculoskeletal: Normal range of motion. Exhibits no edema. Voiced pain to palpation of mid spine. Lymphadenopathy:    No cervical adenopathy.  Neurological: Alert and oriented to person, place, and time. Exhibits normal  muscle tone. Gait normal. Coordination normal.  Skin: Skin is warm and dry. No rash noted. Not diaphoretic. No erythema. No pallor.  Psychiatric: Mood, memory and judgment normal.  Vitals reviewed.  LABORATORY DATA: Lab Results  Component Value Date   WBC 6.4 01/24/2022   HGB 10.6 (L) 01/24/2022   HCT 31.2 (L) 01/24/2022   MCV 94.5 01/24/2022   PLT 321 01/24/2022      Chemistry      Component Value Date/Time   NA 134 (L) 01/24/2022 0809   NA 140 08/30/2021 1659   K 4.8 01/24/2022 0809   CL 102 01/24/2022 0809   CO2 26 01/24/2022 0809   BUN 16 01/24/2022 0809   BUN 17 08/30/2021 1659  CREATININE 0.93 01/24/2022 0809   CREATININE 0.80 08/30/2012 0955      Component Value Date/Time   CALCIUM 9.0 01/24/2022 0809   ALKPHOS 65 01/24/2022 0809   AST 10 (L) 01/24/2022 0809   ALT 7 01/24/2022 0809   BILITOT 0.4 01/24/2022 0809       RADIOGRAPHIC STUDIES:  NM PET Image Initial (PI) Skull Base To Thigh (F-18 FDG)  Result Date: 01/01/2022 CLINICAL DATA:  Initial treatment strategy for left lung non-small-cell carcinoma. EXAM: NUCLEAR MEDICINE PET SKULL BASE TO THIGH TECHNIQUE: 7.6 mCi F-18 FDG was injected intravenously. Full-ring PET imaging was performed from the skull base to thigh after the radiotracer. CT data was obtained and used for attenuation correction and anatomic localization. Fasting blood glucose: 94 mg/dl COMPARISON:  Chest CT on 11/03/2021 FINDINGS: Mediastinal blood-pool activity (background): SUV max = 1.9 Liver activity (reference): SUV max = N/A NECK:  No hypermetabolic lymph nodes or masses. Incidental CT findings:  None. CHEST: 6.4 x 4.1 cm mass with irregular margins is again seen in superior and medial left lower lobe, which involves right hilum. This shows intense hypermetabolism, with SUV max of 18.5. 10 mm mediastinal lymph node in the AP window shows mild hypermetabolism, with SUV max of 2.6. 12 mm subcarinal lymph node shows mild hypermetabolism, with SUV  max of 3.0. Incidental CT findings: Moderate centrilobular emphysema. No evidence of pleural effusion. Aortic and coronary atherosclerotic calcification incidentally noted. ABDOMEN/PELVIS: No abnormal hypermetabolic activity within the liver, pancreas, adrenal glands, or spleen. No hypermetabolic lymph nodes in the abdomen or pelvis. Incidental CT findings: 5 mm nonobstructing calculus in the lower pole of right kidney. Aortic atherosclerotic calcification incidentally noted. SKELETON: No focal hypermetabolic bone lesions to suggest skeletal metastasis. Incidental CT findings:  None. IMPRESSION: 6.4 cm hypermetabolic mass in left lower lobe involvement of the left hilum, consistent with primary bronchogenic carcinoma. Small mediastinal lymph nodes in AP window and subcarinal region show mild hypermetabolic activity, suspicious for lymph node metastases. Evidence of distant metastatic disease. Aortic Atherosclerosis (ICD10-I70.0) and Emphysema (ICD10-J43.9). Electronically Signed   By: Marlaine Hind M.D.   On: 01/01/2022 14:13     ASSESSMENT/PLAN:  This is a very pleasant 71 years old Caucasian male recently diagnosed with at least stage IIIB (T3, N2, M0) non-small cell lung cancer, squamous cell carcinoma presented with large left lower lobe lung mass in addition to subcarinal and AP window lymphadenopathy diagnosed in September 2023.    He had a brain MRI that was negative for metastatic disease of the brain.   Is currently undergoing a course of concurrent chemoradiation with carboplatin for an AUC of 2 and paclitaxel 45 mg per metered squared.  He is status post 2 cycles of treatment.  He is tolerating it well without any concerning adverse side effects.  His last day radiation is tentatively scheduled for 02/23/2022.  Labs were reviewed.  Recommend that he proceed with cycle #3 today as scheduled.   We will see him back for a follow up visit in 2 weeks for evaluation and repeat blood work before  starting cycle #5.   Regarding his cough, patient states that his cough is controlled cough drops.  I asked if he felt like he needed an additional prescription cough medicine which she declined.  Also offered him a chest x-ray to rule out infectious etiology.  The patient states that he "does not feel like he has an infection".  He knows to call us if he has new or  worsening respiratory complaints and needs to be reevaluated.  He is not having any fevers.  He is nontoxic-appearing.  His oxygen is 100% on room air.  Regarding epigastric discomfort, I encouraged the patient to take Maalox in addition to his Protonix.  Encouraged him to discuss with Dr. Sondra Come tomorrow if there is any additional medications for his swallowing.  The patient was very clear that he was not having any odynophagia or dysphagia.  His symptoms predated his radiation treatment.  The patient is followed by pain management.  The patient states his pain is the same as his baseline.  I encouraged him to reach out to pain management doctor and let them know what has been going on with him to see if they would consider adjusting any of his pain medications.  Otherwise I told the patient that he may take an ibuprofen or Tylenol in between his hydrocodone doses.  Just cautioned him to avoid taking more than the daily recommended dose of Tylenol as hydrocodone contains Tylenol.    The patient was advised to call immediately if she has any concerning symptoms in the interval. The patient voices understanding of current disease status and treatment options and is in agreement with the current care plan. All questions were answered. The patient knows to call the clinic with any problems, questions or concerns. We can certainly see the patient much sooner if necessary           No orders of the defined types were placed in this encounter.    The total time spent in the appointment was 20-29 minutes  Syncere Eble L Arlisa Leclere,  PA-C 01/24/22

## 2022-01-20 NOTE — Telephone Encounter (Signed)
Called patient regarding upcoming November and December appointments, patient is notified.

## 2022-01-21 ENCOUNTER — Other Ambulatory Visit: Payer: Self-pay

## 2022-01-21 ENCOUNTER — Inpatient Hospital Stay (HOSPITAL_COMMUNITY)
Admission: RE | Admit: 2022-01-21 | Discharge: 2022-01-21 | Disposition: A | Discharge status: Home / Self Care | Payer: Medicare Other | Source: Ambulatory Visit | Attending: Internal Medicine | Admitting: Internal Medicine

## 2022-01-21 ENCOUNTER — Ambulatory Visit (HOSPITAL_COMMUNITY): Payer: Medicare Other

## 2022-01-21 ENCOUNTER — Ambulatory Visit
Admission: RE | Admit: 2022-01-21 | Discharge: 2022-01-21 | Disposition: A | Discharge status: Home / Self Care | Payer: Medicare Other | Source: Ambulatory Visit | Attending: Radiation Oncology | Admitting: Radiation Oncology

## 2022-01-21 DIAGNOSIS — C3412 Malignant neoplasm of upper lobe, left bronchus or lung: Secondary | ICD-10-CM

## 2022-01-21 DIAGNOSIS — Z510 Encounter for antineoplastic radiation therapy: Secondary | ICD-10-CM | POA: Diagnosis not present

## 2022-01-21 DIAGNOSIS — Z87891 Personal history of nicotine dependence: Secondary | ICD-10-CM | POA: Diagnosis not present

## 2022-01-21 LAB — RAD ONC ARIA SESSION SUMMARY
Course Elapsed Days: 11
Plan Fractions Treated to Date: 9
Plan Prescribed Dose Per Fraction: 2 Gy
Plan Total Fractions Prescribed: 30
Plan Total Prescribed Dose: 60 Gy
Reference Point Dosage Given to Date: 18 Gy
Reference Point Session Dosage Given: 2 Gy
Session Number: 9

## 2022-01-21 MED FILL — Dexamethasone Sodium Phosphate Inj 100 MG/10ML: INTRAMUSCULAR | Qty: 1 | Status: AC

## 2022-01-24 ENCOUNTER — Inpatient Hospital Stay: Payer: Medicare Other

## 2022-01-24 ENCOUNTER — Inpatient Hospital Stay (HOSPITAL_BASED_OUTPATIENT_CLINIC_OR_DEPARTMENT_OTHER): Payer: Medicare Other | Admitting: Physician Assistant

## 2022-01-24 ENCOUNTER — Ambulatory Visit: Payer: Medicare Other | Admitting: Physician Assistant

## 2022-01-24 ENCOUNTER — Other Ambulatory Visit: Payer: Self-pay

## 2022-01-24 ENCOUNTER — Ambulatory Visit
Admission: RE | Admit: 2022-01-24 | Discharge: 2022-01-24 | Disposition: A | Discharge status: Home / Self Care | Payer: Medicare Other | Source: Ambulatory Visit | Attending: Radiation Oncology | Admitting: Radiation Oncology

## 2022-01-24 VITALS — BP 113/68 | HR 88 | Temp 98.2°F | Resp 14 | Wt 141.5 lb

## 2022-01-24 DIAGNOSIS — Z510 Encounter for antineoplastic radiation therapy: Secondary | ICD-10-CM | POA: Diagnosis not present

## 2022-01-24 DIAGNOSIS — C3412 Malignant neoplasm of upper lobe, left bronchus or lung: Secondary | ICD-10-CM

## 2022-01-24 DIAGNOSIS — Z5111 Encounter for antineoplastic chemotherapy: Secondary | ICD-10-CM

## 2022-01-24 DIAGNOSIS — Z87891 Personal history of nicotine dependence: Secondary | ICD-10-CM | POA: Diagnosis not present

## 2022-01-24 LAB — RAD ONC ARIA SESSION SUMMARY
Course Elapsed Days: 14
Plan Fractions Treated to Date: 10
Plan Prescribed Dose Per Fraction: 2 Gy
Plan Total Fractions Prescribed: 30
Plan Total Prescribed Dose: 60 Gy
Reference Point Dosage Given to Date: 20 Gy
Reference Point Session Dosage Given: 2 Gy
Session Number: 10

## 2022-01-24 LAB — CMP (CANCER CENTER ONLY)
ALT: 7 U/L (ref 0–44)
AST: 10 U/L — ABNORMAL LOW (ref 15–41)
Albumin: 3.6 g/dL (ref 3.5–5.0)
Alkaline Phosphatase: 65 U/L (ref 38–126)
Anion gap: 6 (ref 5–15)
BUN: 16 mg/dL (ref 8–23)
CO2: 26 mmol/L (ref 22–32)
Calcium: 9 mg/dL (ref 8.9–10.3)
Chloride: 102 mmol/L (ref 98–111)
Creatinine: 0.93 mg/dL (ref 0.61–1.24)
GFR, Estimated: >60 mL/min (ref >60)
Glucose, Bld: 78 mg/dL (ref 70–99)
Potassium: 4.8 mmol/L (ref 3.5–5.1)
Sodium: 134 mmol/L — ABNORMAL LOW (ref 135–145)
Total Bilirubin: 0.4 mg/dL (ref 0.3–1.2)
Total Protein: 7.4 g/dL (ref 6.5–8.1)

## 2022-01-24 LAB — CBC WITH DIFFERENTIAL (CANCER CENTER ONLY)
Abs Immature Granulocytes: 0.02 10*3/uL (ref 0.00–0.07)
Basophils Absolute: 0.1 10*3/uL (ref 0.0–0.1)
Basophils Relative: 1 %
Eosinophils Absolute: 0.1 10*3/uL (ref 0.0–0.5)
Eosinophils Relative: 1 %
HCT: 31.2 % — ABNORMAL LOW (ref 39.0–52.0)
Hemoglobin: 10.6 g/dL — ABNORMAL LOW (ref 13.0–17.0)
Immature Granulocytes: 0 %
Lymphocytes Relative: 19 %
Lymphs Abs: 1.2 10*3/uL (ref 0.7–4.0)
MCH: 32.1 pg (ref 26.0–34.0)
MCHC: 34 g/dL (ref 30.0–36.0)
MCV: 94.5 fL (ref 80.0–100.0)
Monocytes Absolute: 0.7 10*3/uL (ref 0.1–1.0)
Monocytes Relative: 11 %
Neutro Abs: 4.3 10*3/uL (ref 1.7–7.7)
Neutrophils Relative %: 68 %
Platelet Count: 321 10*3/uL (ref 150–400)
RBC: 3.3 MIL/uL — ABNORMAL LOW (ref 4.22–5.81)
RDW: 14.5 % (ref 11.5–15.5)
WBC Count: 6.4 10*3/uL (ref 4.0–10.5)
nRBC: 0 % (ref 0.0–0.2)

## 2022-01-24 MED ORDER — PALONOSETRON HCL INJECTION 0.25 MG/5ML
0.2500 mg | Freq: Once | INTRAVENOUS | Status: AC
  Administered 2022-01-24: 0.25 mg via INTRAVENOUS
  Filled 2022-01-24: qty 5

## 2022-01-24 MED ORDER — SODIUM CHLORIDE 0.9 % IV SOLN
175.4000 mg | Freq: Once | INTRAVENOUS | Status: AC
  Administered 2022-01-24: 180 mg via INTRAVENOUS
  Filled 2022-01-24: qty 18

## 2022-01-24 MED ORDER — SODIUM CHLORIDE 0.9 % IV SOLN: Freq: Once | INTRAVENOUS | Status: AC

## 2022-01-24 MED ORDER — CETIRIZINE HCL 10 MG/ML IV SOLN
10.0000 mg | Freq: Once | INTRAVENOUS | Status: AC
  Administered 2022-01-24: 10 mg via INTRAVENOUS
  Filled 2022-01-24: qty 1

## 2022-01-24 MED ORDER — SODIUM CHLORIDE 0.9 % IV SOLN
45.0000 mg/m2 | Freq: Once | INTRAVENOUS | Status: AC
  Administered 2022-01-24: 78 mg via INTRAVENOUS
  Filled 2022-01-24: qty 13

## 2022-01-24 MED ORDER — FAMOTIDINE IN NACL 20-0.9 MG/50ML-% IV SOLN
20.0000 mg | Freq: Once | INTRAVENOUS | Status: AC
  Administered 2022-01-24: 20 mg via INTRAVENOUS
  Filled 2022-01-24: qty 50

## 2022-01-24 MED ORDER — SODIUM CHLORIDE 0.9 % IV SOLN
10.0000 mg | Freq: Once | INTRAVENOUS | Status: AC
  Administered 2022-01-24: 10 mg via INTRAVENOUS
  Filled 2022-01-24: qty 10

## 2022-01-24 NOTE — Patient Instructions (Signed)
[facility]  Discharge Instructions: Thank you for choosing [facility] to provide your oncology and hematology care.   If you have a lab appointment with the [facility], please go directly to the [facility] and check in at the registration area.   Wear comfortable clothing and clothing appropriate for easy access to any Portacath or PICC line.   We strive to give you quality time with your provider. You may need to reschedule your appointment if you arrive late (15 or more minutes).  Arriving late affects you and other patients whose appointments are after yours.  Also, if you miss three or more appointments without notifying the office, you may be dismissed from the clinic at the provider's discretion.      For prescription refill requests, have your pharmacy contact our office and allow 72 hours for refills to be completed.    Today you received the following chemotherapy and/or immunotherapy agents: paclitaxel and carboplatin      To help prevent nausea and vomiting after your treatment, we encourage you to take your nausea medication as directed.  BELOW ARE SYMPTOMS THAT SHOULD BE REPORTED IMMEDIATELY: *FEVER GREATER THAN 100.4 F (38 C) OR HIGHER *CHILLS OR SWEATING *NAUSEA AND VOMITING THAT IS NOT CONTROLLED WITH YOUR NAUSEA MEDICATION *UNUSUAL SHORTNESS OF BREATH *UNUSUAL BRUISING OR BLEEDING *URINARY PROBLEMS (pain or burning when urinating, or frequent urination) *BOWEL PROBLEMS (unusual diarrhea, constipation, pain near the anus) TENDERNESS IN MOUTH AND THROAT WITH OR WITHOUT PRESENCE OF ULCERS (sore throat, sores in mouth, or a toothache) UNUSUAL RASH, SWELLING OR PAIN  UNUSUAL VAGINAL DISCHARGE OR ITCHING   Items with * indicate a potential emergency and should be followed up as soon as possible or go to the Emergency Department if any problems should occur.  Please show the CHEMOTHERAPY ALERT CARD or IMMUNOTHERAPY ALERT  CARD at check-in to the Emergency Department and triage nurse.  Should you have questions after your visit or need to cancel or reschedule your appointment, please contact [facility]  Dept: [phone]  and follow the prompts.  Office hours are 8:00 a.m. to 4:30 p.m. Monday - Friday. Please note that voicemails left after 4:00 p.m. may not be returned until the following business day.  We are closed weekends and major holidays. You have access to a nurse at all times for urgent questions. Please call the main number to the clinic Dept: [phone] and follow the prompts.   For any non-urgent questions, you may also contact your provider using MyChart. We now offer e-Visits for anyone 43 and older to request care online for non-urgent symptoms. For details visit [email or web address].   Also download the MyChart app! Go to the app store, search "MyChart", open the app, select [facility], and log in with your MyChart username and password.  Masks are optional in the cancer centers. If you would like for your care team to wear a mask while they are taking care of you, please let them know. You may have one support Sai Zinn who is at least 70 years old accompany you for your appointments.

## 2022-01-25 ENCOUNTER — Ambulatory Visit
Admission: RE | Admit: 2022-01-25 | Discharge: 2022-01-25 | Disposition: A | Discharge status: Home / Self Care | Payer: Medicare Other | Source: Ambulatory Visit | Attending: Radiation Oncology | Admitting: Radiation Oncology

## 2022-01-25 ENCOUNTER — Other Ambulatory Visit: Payer: Self-pay

## 2022-01-25 DIAGNOSIS — C3412 Malignant neoplasm of upper lobe, left bronchus or lung: Secondary | ICD-10-CM | POA: Diagnosis not present

## 2022-01-25 DIAGNOSIS — Z510 Encounter for antineoplastic radiation therapy: Secondary | ICD-10-CM | POA: Diagnosis not present

## 2022-01-25 DIAGNOSIS — Z87891 Personal history of nicotine dependence: Secondary | ICD-10-CM | POA: Diagnosis not present

## 2022-01-25 LAB — RAD ONC ARIA SESSION SUMMARY
Course Elapsed Days: 15
Plan Fractions Treated to Date: 11
Plan Prescribed Dose Per Fraction: 2 Gy
Plan Total Fractions Prescribed: 30
Plan Total Prescribed Dose: 60 Gy
Reference Point Dosage Given to Date: 22 Gy
Reference Point Session Dosage Given: 2 Gy
Session Number: 11

## 2022-01-26 ENCOUNTER — Other Ambulatory Visit: Payer: Self-pay

## 2022-01-26 ENCOUNTER — Ambulatory Visit
Admission: RE | Admit: 2022-01-26 | Discharge: 2022-01-26 | Disposition: A | Discharge status: Home / Self Care | Payer: Medicare Other | Source: Ambulatory Visit | Attending: Radiation Oncology | Admitting: Radiation Oncology

## 2022-01-26 DIAGNOSIS — C3412 Malignant neoplasm of upper lobe, left bronchus or lung: Secondary | ICD-10-CM | POA: Diagnosis not present

## 2022-01-26 DIAGNOSIS — Z87891 Personal history of nicotine dependence: Secondary | ICD-10-CM | POA: Diagnosis not present

## 2022-01-26 DIAGNOSIS — Z510 Encounter for antineoplastic radiation therapy: Secondary | ICD-10-CM | POA: Diagnosis not present

## 2022-01-26 LAB — RAD ONC ARIA SESSION SUMMARY
Course Elapsed Days: 16
Plan Fractions Treated to Date: 12
Plan Prescribed Dose Per Fraction: 2 Gy
Plan Total Fractions Prescribed: 30
Plan Total Prescribed Dose: 60 Gy
Reference Point Dosage Given to Date: 24 Gy
Reference Point Session Dosage Given: 2 Gy
Session Number: 12

## 2022-01-27 ENCOUNTER — Other Ambulatory Visit: Payer: Self-pay

## 2022-01-27 ENCOUNTER — Ambulatory Visit
Admission: RE | Admit: 2022-01-27 | Discharge: 2022-01-27 | Disposition: A | Discharge status: Home / Self Care | Payer: Medicare Other | Source: Ambulatory Visit | Attending: Radiation Oncology | Admitting: Radiation Oncology

## 2022-01-27 DIAGNOSIS — Z87891 Personal history of nicotine dependence: Secondary | ICD-10-CM | POA: Diagnosis not present

## 2022-01-27 DIAGNOSIS — Z510 Encounter for antineoplastic radiation therapy: Secondary | ICD-10-CM | POA: Diagnosis not present

## 2022-01-27 DIAGNOSIS — C3412 Malignant neoplasm of upper lobe, left bronchus or lung: Secondary | ICD-10-CM | POA: Diagnosis not present

## 2022-01-27 LAB — RAD ONC ARIA SESSION SUMMARY
Course Elapsed Days: 17
Plan Fractions Treated to Date: 13
Plan Prescribed Dose Per Fraction: 2 Gy
Plan Total Fractions Prescribed: 30
Plan Total Prescribed Dose: 60 Gy
Reference Point Dosage Given to Date: 26 Gy
Reference Point Session Dosage Given: 2 Gy
Session Number: 13

## 2022-01-28 ENCOUNTER — Other Ambulatory Visit: Payer: Self-pay

## 2022-01-28 ENCOUNTER — Ambulatory Visit
Admission: RE | Admit: 2022-01-28 | Discharge: 2022-01-28 | Disposition: A | Discharge status: Home / Self Care | Payer: Medicare Other | Source: Ambulatory Visit | Attending: Radiation Oncology | Admitting: Radiation Oncology

## 2022-01-28 DIAGNOSIS — Z510 Encounter for antineoplastic radiation therapy: Secondary | ICD-10-CM | POA: Diagnosis not present

## 2022-01-28 DIAGNOSIS — C3412 Malignant neoplasm of upper lobe, left bronchus or lung: Secondary | ICD-10-CM | POA: Diagnosis not present

## 2022-01-28 DIAGNOSIS — Z87891 Personal history of nicotine dependence: Secondary | ICD-10-CM | POA: Diagnosis not present

## 2022-01-28 LAB — RAD ONC ARIA SESSION SUMMARY
Course Elapsed Days: 18
Plan Fractions Treated to Date: 14
Plan Prescribed Dose Per Fraction: 2 Gy
Plan Total Fractions Prescribed: 30
Plan Total Prescribed Dose: 60 Gy
Reference Point Dosage Given to Date: 28 Gy
Reference Point Session Dosage Given: 2 Gy
Session Number: 14

## 2022-01-28 MED FILL — Dexamethasone Sodium Phosphate Inj 100 MG/10ML: INTRAMUSCULAR | Qty: 1 | Status: AC

## 2022-01-30 ENCOUNTER — Ambulatory Visit
Admission: RE | Admit: 2022-01-30 | Discharge: 2022-01-30 | Disposition: A | Discharge status: Home / Self Care | Payer: Medicare Other | Source: Ambulatory Visit | Attending: Radiation Oncology | Admitting: Radiation Oncology

## 2022-01-30 ENCOUNTER — Other Ambulatory Visit: Payer: Self-pay

## 2022-01-30 DIAGNOSIS — Z87891 Personal history of nicotine dependence: Secondary | ICD-10-CM | POA: Diagnosis not present

## 2022-01-30 DIAGNOSIS — C3412 Malignant neoplasm of upper lobe, left bronchus or lung: Secondary | ICD-10-CM | POA: Diagnosis not present

## 2022-01-30 DIAGNOSIS — Z510 Encounter for antineoplastic radiation therapy: Secondary | ICD-10-CM | POA: Diagnosis not present

## 2022-01-30 LAB — RAD ONC ARIA SESSION SUMMARY
Course Elapsed Days: 20
Plan Fractions Treated to Date: 15
Plan Prescribed Dose Per Fraction: 2 Gy
Plan Total Fractions Prescribed: 30
Plan Total Prescribed Dose: 60 Gy
Reference Point Dosage Given to Date: 30 Gy
Reference Point Session Dosage Given: 2 Gy
Session Number: 15

## 2022-01-30 NOTE — Progress Notes (Signed)
[facility] OFFICE PROGRESS NOTE  Dettinger, [name], MD [place] [address]  DIAGNOSIS:  Stage IIIB (T3, N2, M0) non-small cell lung cancer, squamous cell carcinoma presented with large left lower lobe lung mass in addition subcarinal and AP window nodal metastases. Diagnosed in September 2023.     PRIOR THERAPY: None  CURRENT THERAPY: Concurrent chemo/radiation with carboplatin for an AUC of 2 and paclitaxel 45 mg/m2 weekly. First dose expected 01/10/22. Status post 5 cycles.    INTERVAL HISTORY: Cameron Roman 71 y.o. male returns to the clinic today for a follow-up visit.  The patient is feeling fair today without any concerning complaints.  He is currently undergoing a course of concurrent chemoradiation and his last day radiation is scheduled for 02/22/22.  The patient has chronic chest pain/burning/back and neck pain which predates his malignancy he sees pain management.  He is prescribed gabapentin and hydrocodone.    Today, he reports continued burning in his chest but states the medication helps. He is prescribed carafate and viscous lidocaine. He denies any fever, chills, drenching night sweats. He reports a few pounds of weight loss. He reports his appetite is fair. He denies worsening shortness of breath or cough.Denies any hemoptysis.  Denies any nausea, vomiting, diarrhea, or constipation. Denies any headache or visual changes. He reports continued fatigue.   Patient is here today for evaluation and repeat blood work before undergoing cycle #6.      MEDICAL HISTORY: Past Medical History:  Diagnosis Date   Anxiety    Arthritis    Atrial fibrillation (HCC)    Chronic back pain    buldging disc   Chronic neck pain    ruptured disc   Cough    hx smoking   Diverticulosis    Esophageal stricture    GERD (gastroesophageal reflux disease)    takes Prilosec daily   H/O hiatal hernia    Hearing loss    Hepatitis-C 2017   treated and cured per  patient   Hiatal hernia    History of colon polyps    Insomnia    takes Elavil nightly   Lipoma of abdominal wall 02/11/2011   Lung cancer ([facility])    Stroke (HCC)    x 2   Type 2 diabetes mellitus ([facility]) 07/22/2020   patient denies DM, per MD note pt is pre-diabetic    ALLERGIES:  is allergic to percocet [oxycodone-acetaminophen].  MEDICATIONS:  Current Outpatient Medications  Medication Sig Dispense Refill   prochlorperazine (COMPAZINE) 10 MG tablet Take 1 tablet (10 mg total) by mouth every 6 (six) hours as needed for nausea or vomiting. 30 tablet 1   sucralfate (CARAFATE) 1 g tablet Take 1 tablet (1 g total) by mouth 4 (four) times daily -  with meals and at bedtime. Crush and dissolve in 10 mL of warm water prior to swallowing 120 tablet 1   tamsulosin (FLOMAX) 0.4 MG CAPS capsule Take 1 capsule (0.4 mg total) by mouth daily. 90 capsule 3   apixaban (ELIQUIS) 5 MG TABS tablet TAKE ONE TABLET BY MOUTH TWICE DAILY 60 tablet 5   fluticasone (FLONASE) 50 MCG/ACT nasal spray Place 1 spray into the nose in the morning.     gabapentin (NEURONTIN) 300 MG capsule Take 1 capsule (300 mg total) by mouth 3 (three) times daily. Take one capsule twice to three times daily (Patient taking differently: Take 300 mg by mouth 2 (two) times daily.) 270 capsule 3  lidocaine (XYLOCAINE) 2 % solution Use as directed 15 mLs in the mouth or throat 3 (three) times daily with meals. Swallow 1/2 hour prior to meals 100 mL 2   Omega-3 Fatty Acids (FISH OIL) 1000 MG CAPS Take 1,000 mg by mouth in the morning and at bedtime.      omeprazole (PRILOSEC) 20 MG capsule Take 1 capsule (20 mg total) by mouth daily. 90 capsule 3   pantoprazole (PROTONIX) 40 MG tablet Take 1 tablet (40 mg total) by mouth daily. 90 tablet 3   pravastatin (PRAVACHOL) 20 MG tablet Take 1 tablet (20 mg total) by mouth daily. Take one tablet once daily (Patient taking differently: Take 20 mg by mouth at bedtime. Take one tablet once daily) 90  tablet 3   Current Facility-Administered Medications  Medication Dose Route Frequency Provider Last Rate Last Admin   methylPREDNISolone acetate (DEPO-MEDROL) injection 80 mg  80 mg Intramuscular Once Dettinger, [name], MD       Facility-Administered Medications Ordered in Other Visits  Medication Dose Route Frequency Provider Last Rate Last Admin   CARBOplatin (PARAPLATIN) 180 mg in sodium chloride 0.9 % 100 mL chemo infusion  180 mg Intravenous Once Curt Bears, MD       dexamethasone (DECADRON) 10 mg in sodium chloride 0.9 % 50 mL IVPB  10 mg Intravenous Once Curt Bears, MD       famotidine (PEPCID) IVPB 20 mg premix  20 mg Intravenous Once Curt Bears, MD 200 mL/hr at 02/07/22 1301 20 mg at 02/07/22 1301   PACLitaxel (TAXOL) 78 mg in sodium chloride 0.9 % 250 mL chemo infusion (</= 80mg /m2)  45 mg/m2 (Treatment Plan Recorded) Intravenous Once Curt Bears, MD        SURGICAL HISTORY:  Past Surgical History:  Procedure Laterality Date   ANTERIOR CERVICAL DECOMP/DISCECTOMY FUSION  04/12/2011   Procedure: ANTERIOR CERVICAL DECOMPRESSION/DISCECTOMY FUSION 2 LEVELS;  Surgeon: Peggyann Shoals, MD;  Location: [facility];  Service: Neurosurgery;  Laterality: N/A;  exploration of Cervical four - seven  fusion with redo Cervical six- seven, cervical three- four anterior cervical decompression with fusion interbody prothesis plating and bonegraft and C34 anterior cervical decompression with inte   APPENDECTOMY     at age 11   BRONCHIAL BIOPSY  11/30/2021   Procedure: BRONCHIAL BIOPSIES;  Surgeon: Garner Nash, DO;  Location: [facility];  Service: Pulmonary;;   BRONCHIAL BRUSHINGS  11/30/2021   Procedure: BRONCHIAL BRUSHINGS;  Surgeon: Garner Nash, DO;  Location: [facility];  Service: Pulmonary;;   CARDIAC CATHETERIZATION  06/03/2004   COLONOSCOPY     EP IMPLANTABLE DEVICE N/A 09/14/2015   Procedure: Loop Recorder Insertion;  Surgeon: Evans Lance, MD;   Location: [facility];  Service: Cardiovascular;  Laterality: N/A;   FINE NEEDLE ASPIRATION  11/30/2021   Procedure: FINE NEEDLE ASPIRATION (FNA) LINEAR;  Surgeon: Garner Nash, DO;  Location: [facility];  Service: Pulmonary;;   HAND SURGERY Right 03/15/2003   right x 3   INNER EAR SURGERY Bilateral    bil;titanium in both ears   lower back surgery  03/15/2007   had plates and screws inserted   NECK SURGERY  03/15/2007   had insertion of  plates and screws   TEE WITHOUT CARDIOVERSION N/A 09/14/2015   Procedure: TRANSESOPHAGEAL ECHOCARDIOGRAM (TEE);  Surgeon: Thayer Headings, MD;  Location: [facility];  Service: Cardiovascular;  Laterality: N/A;   TRANSFORAMINAL LUMBAR INTERBODY FUSION (TLIF) WITH PEDICLE SCREW FIXATION  1 LEVEL Left 09/27/2019   Procedure: Left Lumbar 5 Sacral 1 Transforaminal lumbar interbody fusion with exploration/removal of adjacent level hardware;  Surgeon: Erline Levine, MD;  Location: [facility];  Service: Neurosurgery;  Laterality: Left;  3C/RM 19   VIDEO BRONCHOSCOPY WITH ENDOBRONCHIAL ULTRASOUND N/A 11/30/2021   Procedure: VIDEO BRONCHOSCOPY WITH ENDOBRONCHIAL ULTRASOUND;  Surgeon: Garner Nash, DO;  Location: [facility];  Service: Pulmonary;  Laterality: N/A;    REVIEW OF SYSTEMS:   Review of Systems  Constitutional: Negative for appetite change, chills, fever and unexpected weight change. fatigue  HENT:   Negative for mouth sores, nosebleeds, sore throat and trouble swallowing.   Eyes: Negative for eye problems and icterus.  Respiratory: Negative for cough, hemoptysis, shortness of breath and wheezing.   Cardiovascular: Negative for chest pain and leg swelling. Chest burning  Gastrointestinal: Negative for abdominal pain, constipation, diarrhea, nausea and vomiting.  Genitourinary: Negative for bladder incontinence, difficulty urinating, dysuria, frequency and hematuria.   Musculoskeletal: Negative for back pain, gait problem, neck pain and neck  stiffness.  Skin: Negative for itching and rash.  Neurological: Negative for dizziness, extremity weakness, gait problem, headaches, light-headedness and seizures.  Hematological: Negative for adenopathy. Does not bruise/bleed easily.  Psychiatric/Behavioral: Negative for confusion, depression and sleep disturbance. The patient is not nervous/anxious.     PHYSICAL EXAMINATION:  Blood pressure 116/79, pulse 85, temperature 100.2 F (37.9 C), temperature source Oral, resp. rate 15, weight 144 lb 4.8 oz (65.5 kg), SpO2 99 %.  ECOG PERFORMANCE STATUS: 1  Physical Exam  Constitutional: Oriented to person, place, and time and well-developed, well-nourished, and in no distress.  HENT:  Head: Normocephalic and atraumatic.  Mouth/Throat: Oropharynx is clear and moist. No oropharyngeal exudate.  Eyes: Conjunctivae are normal. Right eye exhibits no discharge. Left eye exhibits no discharge. No scleral icterus.  Neck: Normal range of motion. Neck supple.  Cardiovascular: Normal rate, regular rhythm, normal heart sounds and intact distal pulses.   Pulmonary/Chest: Effort normal. Positive for decreased breath sounds bilaterally. No respiratory distress. No wheezes. No rales.  Abdominal: Soft.  Exhibits no distension and no mass. There is no tenderness.  Musculoskeletal: Normal range of motion. Exhibits no edema.  Lymphadenopathy:    No cervical adenopathy.  Neurological: Alert and oriented to person, place, and time. Exhibits normal muscle tone. Gait normal. Coordination normal.  Skin: Skin is warm and dry. No rash noted. Not diaphoretic. No erythema. No pallor.  Psychiatric: Mood, memory and judgment normal.  Vitals reviewed.  LABORATORY DATA: Lab Results  Component Value Date   WBC 4.0 02/07/2022   HGB 9.9 (L) 02/07/2022   HCT 28.6 (L) 02/07/2022   MCV 92.6 02/07/2022   PLT 204 02/07/2022      Chemistry      Component Value Date/Time   NA 134 (L) 02/07/2022 1159   NA 140 08/30/2021  1659   K 4.0 02/07/2022 1159   CL 104 02/07/2022 1159   CO2 25 02/07/2022 1159   BUN 17 02/07/2022 1159   BUN 17 08/30/2021 1659   CREATININE 0.81 02/07/2022 1159   CREATININE 0.80 08/30/2012 0955      Component Value Date/Time   CALCIUM 9.0 02/07/2022 1159   ALKPHOS 61 02/07/2022 1159   AST 10 (L) 02/07/2022 1159   ALT 5 02/07/2022 1159   BILITOT 0.2 (L) 02/07/2022 1159       RADIOGRAPHIC STUDIES:  No results found.   ASSESSMENT/PLAN:  This is a very pleasant 71 years old Caucasian  male recently diagnosed with at least stage IIIB (T3, N2, M0) non-small cell lung cancer, squamous cell carcinoma presented with large left lower lobe lung mass in addition to subcarinal and AP window lymphadenopathy diagnosed in September 2023.    He had a brain MRI that was negative for metastatic disease of the brain.    He is currently undergoing a course of concurrent chemoradiation with carboplatin for an AUC of 2 and paclitaxel 45 mg per metered squared.  He is status post 5 cycles of treatment.  He is tolerating it well without any concerning adverse side effects.  His last day radiation is tentatively scheduled for 02/22/2022. Therefore, his last chemo will be 12/4.   Labs were reviewed.  Recommend that he proceed with cycle #6  today as scheduled.   I will arrange for a restaging CT scan of the chest to be performed abut 3 weeks after his radiation. I will schedule a follow up visit a few days after the scan to review the results and discuss the next steps.   He will continue to follow with pain management with his gabapentin and hydrocodone for his neck and chest discomfort.  He will continue with his viscous lidocaine and Carafate for his esophagitis.   The patient was advised to call immediately if he has any concerning symptoms in the interval. The patient voices understanding of current disease status and treatment options and is in agreement with the current care plan. All  questions were answered. The patient knows to call the clinic with any problems, questions or concerns. We can certainly see the patient much sooner if necessary       Orders Placed This Encounter  Procedures   CT Chest W Contrast    Standing Status:   Future    Standing Expiration Date:   02/07/2023    Order Specific Question:   If indicated for the ordered procedure, I authorize the administration of contrast media per Radiology protocol    Answer:   Yes    Order Specific Question:   Does the patient have a contrast media/X-ray dye allergy?    Answer:   No    Order Specific Question:   Preferred imaging location?    Answer:   [facility]     The total time spent in the appointment was 20-29 minutes.   Nizhoni Parlow L Balian Schaller, PA-C 02/07/22

## 2022-01-31 ENCOUNTER — Other Ambulatory Visit: Payer: Self-pay

## 2022-01-31 ENCOUNTER — Inpatient Hospital Stay: Payer: Medicare Other

## 2022-01-31 ENCOUNTER — Ambulatory Visit
Admission: RE | Admit: 2022-01-31 | Discharge: 2022-01-31 | Disposition: A | Discharge status: Home / Self Care | Payer: Medicare Other | Source: Ambulatory Visit | Attending: Radiation Oncology | Admitting: Radiation Oncology

## 2022-01-31 VITALS — BP 131/82 | HR 83 | Temp 98.8°F | Resp 18 | Wt 140.8 lb

## 2022-01-31 DIAGNOSIS — C3412 Malignant neoplasm of upper lobe, left bronchus or lung: Secondary | ICD-10-CM

## 2022-01-31 DIAGNOSIS — Z510 Encounter for antineoplastic radiation therapy: Secondary | ICD-10-CM | POA: Diagnosis not present

## 2022-01-31 DIAGNOSIS — Z87891 Personal history of nicotine dependence: Secondary | ICD-10-CM | POA: Diagnosis not present

## 2022-01-31 LAB — CBC WITH DIFFERENTIAL (CANCER CENTER ONLY)
Abs Immature Granulocytes: 0.01 10*3/uL (ref 0.00–0.07)
Basophils Absolute: 0 10*3/uL (ref 0.0–0.1)
Basophils Relative: 1 %
Eosinophils Absolute: 0.1 10*3/uL (ref 0.0–0.5)
Eosinophils Relative: 1 %
HCT: 30.4 % — ABNORMAL LOW (ref 39.0–52.0)
Hemoglobin: 10.2 g/dL — ABNORMAL LOW (ref 13.0–17.0)
Immature Granulocytes: 0 %
Lymphocytes Relative: 27 %
Lymphs Abs: 1.2 10*3/uL (ref 0.7–4.0)
MCH: 31.7 pg (ref 26.0–34.0)
MCHC: 33.6 g/dL (ref 30.0–36.0)
MCV: 94.4 fL (ref 80.0–100.0)
Monocytes Absolute: 0.5 10*3/uL (ref 0.1–1.0)
Monocytes Relative: 11 %
Neutro Abs: 2.6 10*3/uL (ref 1.7–7.7)
Neutrophils Relative %: 60 %
Platelet Count: 266 10*3/uL (ref 150–400)
RBC: 3.22 MIL/uL — ABNORMAL LOW (ref 4.22–5.81)
RDW: 15.2 % (ref 11.5–15.5)
WBC Count: 4.3 10*3/uL (ref 4.0–10.5)
nRBC: 0 % (ref 0.0–0.2)

## 2022-01-31 LAB — RAD ONC ARIA SESSION SUMMARY
Course Elapsed Days: 21
Plan Fractions Treated to Date: 16
Plan Prescribed Dose Per Fraction: 2 Gy
Plan Total Fractions Prescribed: 30
Plan Total Prescribed Dose: 60 Gy
Reference Point Dosage Given to Date: 32 Gy
Reference Point Session Dosage Given: 2 Gy
Session Number: 16

## 2022-01-31 LAB — CMP (CANCER CENTER ONLY)
ALT: 5 U/L (ref 0–44)
AST: 9 U/L — ABNORMAL LOW (ref 15–41)
Albumin: 3.7 g/dL (ref 3.5–5.0)
Alkaline Phosphatase: 66 U/L (ref 38–126)
Anion gap: 7 (ref 5–15)
BUN: 25 mg/dL — ABNORMAL HIGH (ref 8–23)
CO2: 24 mmol/L (ref 22–32)
Calcium: 9.4 mg/dL (ref 8.9–10.3)
Chloride: 105 mmol/L (ref 98–111)
Creatinine: 0.73 mg/dL (ref 0.61–1.24)
GFR, Estimated: >60 mL/min
Glucose, Bld: 101 mg/dL — ABNORMAL HIGH (ref 70–99)
Potassium: 4.2 mmol/L (ref 3.5–5.1)
Sodium: 136 mmol/L (ref 135–145)
Total Bilirubin: 0.2 mg/dL — ABNORMAL LOW (ref 0.3–1.2)
Total Protein: 7.5 g/dL (ref 6.5–8.1)

## 2022-01-31 MED ORDER — PALONOSETRON HCL INJECTION 0.25 MG/5ML
0.2500 mg | Freq: Once | INTRAVENOUS | Status: AC
  Administered 2022-01-31: 0.25 mg via INTRAVENOUS
  Filled 2022-01-31: qty 5

## 2022-01-31 MED ORDER — CETIRIZINE HCL 10 MG/ML IV SOLN
10.0000 mg | Freq: Once | INTRAVENOUS | Status: AC
  Administered 2022-01-31: 10 mg via INTRAVENOUS
  Filled 2022-01-31: qty 1

## 2022-01-31 MED ORDER — SODIUM CHLORIDE 0.9 % IV SOLN
175.4000 mg | Freq: Once | INTRAVENOUS | Status: AC
  Administered 2022-01-31: 180 mg via INTRAVENOUS
  Filled 2022-01-31: qty 18

## 2022-01-31 MED ORDER — SODIUM CHLORIDE 0.9 % IV SOLN
10.0000 mg | Freq: Once | INTRAVENOUS | Status: AC
  Administered 2022-01-31: 10 mg via INTRAVENOUS
  Filled 2022-01-31: qty 10

## 2022-01-31 MED ORDER — SODIUM CHLORIDE 0.9 % IV SOLN
45.0000 mg/m2 | Freq: Once | INTRAVENOUS | Status: AC
  Administered 2022-01-31: 78 mg via INTRAVENOUS
  Filled 2022-01-31: qty 13

## 2022-01-31 MED ORDER — FAMOTIDINE IN NACL 20-0.9 MG/50ML-% IV SOLN
20.0000 mg | Freq: Once | INTRAVENOUS | Status: AC
  Administered 2022-01-31: 20 mg via INTRAVENOUS
  Filled 2022-01-31: qty 50

## 2022-01-31 MED ORDER — SODIUM CHLORIDE 0.9 % IV SOLN: Freq: Once | INTRAVENOUS | Status: AC

## 2022-01-31 NOTE — Patient Instructions (Signed)
[facility]COL[facility]  Discharge Instructions: Thank you for choosing [facility] to provide your oncology and hematology care.   If you have a lab appointment with the [facility], please go directly to the [facility] and check in at the registration area.   Wear comfortable clothing and clothing appropriate for easy access to any Portacath or PICC line.   We strive to give you quality time with your provider. You may need to reschedule your appointment if you arrive late (15 or more minutes).  Arriving late affects you and other patients whose appointments are after yours.  Also, if you miss three or more appointments without notifying the office, you may be dismissed from the clinic at the provider's discretion.      For prescription refill requests, have your pharmacy contact our office and allow 72 hours for refills to be completed.    Today you received the following chemotherapy and/or immunotherapy agents: paclitaxel and carboplatin      To help prevent nausea and vomiting after your treatment, we encourage you to take your nausea medication as directed.  BELOW ARE SYMPTOMS THAT SHOULD BE REPORTED IMMEDIATELY: *FEVER GREATER THAN 100.4 F (38 C) OR HIGHER *CHILLS OR SWEATING *NAUSEA AND VOMITING THAT IS NOT CONTROLLED WITH YOUR NAUSEA MEDICATION *UNUSUAL SHORTNESS OF BREATH *UNUSUAL BRUISING OR BLEEDING *URINARY PROBLEMS (pain or burning when urinating, or frequent urination) *BOWEL PROBLEMS (unusual diarrhea, constipation, pain near the anus) TENDERNESS IN MOUTH AND THROAT WITH OR WITHOUT PRESENCE OF ULCERS (sore throat, sores in mouth, or a toothache) UNUSUAL RASH, SWELLING OR PAIN  UNUSUAL VAGINAL DISCHARGE OR ITCHING   Items with * indicate a potential emergency and should be followed up as soon as possible or go to the Emergency Department if any problems should occur.  Please show the CHEMOTHERAPY ALERT CARD or IMMUNOTHERAPY ALERT  CARD at check-in to the Emergency Department and triage nurse.  Should you have questions after your visit or need to cancel or reschedule your appointment, please contact [facility]  Dept: [phone]  and follow the prompts.  Office hours are 8:00 a.m. to 4:30 p.m. Monday - Friday. Please note that voicemails left after 4:00 p.m. may not be returned until the following business day.  We are closed weekends and major holidays. You have access to a nurse at all times for urgent questions. Please call the main number to the clinic Dept: [phone] and follow the prompts.   For any non-urgent questions, you may also contact your provider using MyChart. We now offer e-Visits for anyone 56 and older to request care online for non-urgent symptoms. For details visit [email or web address].   Also download the MyChart app! Go to the app store, search "MyChart", open the app, select [facility], and log in with your MyChart username and password.  Masks are optional in the cancer centers. If you would like for your care team to wear a mask while they are taking care of you, please let them know. You may have one support person who is at least 70 years old accompany you for your appointments.

## 2022-02-01 ENCOUNTER — Other Ambulatory Visit: Payer: Self-pay

## 2022-02-01 ENCOUNTER — Ambulatory Visit
Admission: RE | Admit: 2022-02-01 | Discharge: 2022-02-01 | Disposition: A | Discharge status: Home / Self Care | Payer: Medicare Other | Source: Ambulatory Visit | Attending: Radiation Oncology | Admitting: Radiation Oncology

## 2022-02-01 ENCOUNTER — Other Ambulatory Visit: Payer: Self-pay | Admitting: Radiation Oncology

## 2022-02-01 DIAGNOSIS — C3412 Malignant neoplasm of upper lobe, left bronchus or lung: Secondary | ICD-10-CM

## 2022-02-01 DIAGNOSIS — Z87891 Personal history of nicotine dependence: Secondary | ICD-10-CM | POA: Diagnosis not present

## 2022-02-01 DIAGNOSIS — Z510 Encounter for antineoplastic radiation therapy: Secondary | ICD-10-CM | POA: Diagnosis not present

## 2022-02-01 LAB — RAD ONC ARIA SESSION SUMMARY
Course Elapsed Days: 22
Plan Fractions Treated to Date: 17
Plan Prescribed Dose Per Fraction: 2 Gy
Plan Total Fractions Prescribed: 30
Plan Total Prescribed Dose: 60 Gy
Reference Point Dosage Given to Date: 34 Gy
Reference Point Session Dosage Given: 2 Gy
Session Number: 17

## 2022-02-01 MED ORDER — SUCRALFATE 1 G PO TABS: 1.0000 g | ORAL_TABLET | Freq: Three times a day (TID) | ORAL | 1 refills | Status: DC

## 2022-02-01 MED ORDER — SONAFINE EX EMUL
1.0000 | Freq: Once | CUTANEOUS | Status: AC
  Administered 2022-02-01: 1 via TOPICAL

## 2022-02-01 MED ORDER — LIDOCAINE VISCOUS HCL 2 % MT SOLN: 15.0000 mL | Freq: Three times a day (TID) | OROMUCOSAL | 2 refills | Status: DC

## 2022-02-02 ENCOUNTER — Other Ambulatory Visit: Payer: Self-pay

## 2022-02-02 ENCOUNTER — Ambulatory Visit
Admission: RE | Admit: 2022-02-02 | Discharge: 2022-02-02 | Disposition: A | Discharge status: Home / Self Care | Payer: Medicare Other | Source: Ambulatory Visit | Attending: Radiation Oncology | Admitting: Radiation Oncology

## 2022-02-02 DIAGNOSIS — Z87891 Personal history of nicotine dependence: Secondary | ICD-10-CM | POA: Diagnosis not present

## 2022-02-02 DIAGNOSIS — C3412 Malignant neoplasm of upper lobe, left bronchus or lung: Secondary | ICD-10-CM | POA: Diagnosis not present

## 2022-02-02 DIAGNOSIS — Z510 Encounter for antineoplastic radiation therapy: Secondary | ICD-10-CM | POA: Diagnosis not present

## 2022-02-02 LAB — RAD ONC ARIA SESSION SUMMARY
Course Elapsed Days: 23
Plan Fractions Treated to Date: 18
Plan Prescribed Dose Per Fraction: 2 Gy
Plan Total Fractions Prescribed: 30
Plan Total Prescribed Dose: 60 Gy
Reference Point Dosage Given to Date: 36 Gy
Reference Point Session Dosage Given: 2 Gy
Session Number: 18

## 2022-02-04 MED FILL — Dexamethasone Sodium Phosphate Inj 100 MG/10ML: INTRAMUSCULAR | Qty: 1 | Status: AC

## 2022-02-07 ENCOUNTER — Inpatient Hospital Stay: Payer: Medicare Other

## 2022-02-07 ENCOUNTER — Other Ambulatory Visit: Payer: Self-pay

## 2022-02-07 ENCOUNTER — Encounter: Payer: Self-pay | Admitting: Physician Assistant

## 2022-02-07 ENCOUNTER — Ambulatory Visit
Admission: RE | Admit: 2022-02-07 | Discharge: 2022-02-07 | Disposition: A | Discharge status: Home / Self Care | Payer: Medicare Other | Source: Ambulatory Visit | Attending: Radiation Oncology | Admitting: Radiation Oncology

## 2022-02-07 ENCOUNTER — Inpatient Hospital Stay (HOSPITAL_BASED_OUTPATIENT_CLINIC_OR_DEPARTMENT_OTHER): Payer: Medicare Other | Admitting: Physician Assistant

## 2022-02-07 VITALS — BP 116/79 | HR 85 | Temp 100.2°F | Resp 15 | Wt 144.3 lb

## 2022-02-07 DIAGNOSIS — Z87891 Personal history of nicotine dependence: Secondary | ICD-10-CM | POA: Diagnosis not present

## 2022-02-07 DIAGNOSIS — C3412 Malignant neoplasm of upper lobe, left bronchus or lung: Secondary | ICD-10-CM

## 2022-02-07 DIAGNOSIS — Z510 Encounter for antineoplastic radiation therapy: Secondary | ICD-10-CM | POA: Diagnosis not present

## 2022-02-07 DIAGNOSIS — Z5111 Encounter for antineoplastic chemotherapy: Secondary | ICD-10-CM | POA: Diagnosis not present

## 2022-02-07 LAB — CBC WITH DIFFERENTIAL (CANCER CENTER ONLY)
Abs Immature Granulocytes: 0.01 10*3/uL (ref 0.00–0.07)
Basophils Absolute: 0 10*3/uL (ref 0.0–0.1)
Basophils Relative: 1 %
Eosinophils Absolute: 0.1 10*3/uL (ref 0.0–0.5)
Eosinophils Relative: 1 %
HCT: 28.6 % — ABNORMAL LOW (ref 39.0–52.0)
Hemoglobin: 9.9 g/dL — ABNORMAL LOW (ref 13.0–17.0)
Immature Granulocytes: 0 %
Lymphocytes Relative: 33 %
Lymphs Abs: 1.3 10*3/uL (ref 0.7–4.0)
MCH: 32 pg (ref 26.0–34.0)
MCHC: 34.6 g/dL (ref 30.0–36.0)
MCV: 92.6 fL (ref 80.0–100.0)
Monocytes Absolute: 0.4 10*3/uL (ref 0.1–1.0)
Monocytes Relative: 11 %
Neutro Abs: 2.2 10*3/uL (ref 1.7–7.7)
Neutrophils Relative %: 54 %
Platelet Count: 204 10*3/uL (ref 150–400)
RBC: 3.09 MIL/uL — ABNORMAL LOW (ref 4.22–5.81)
RDW: 15.6 % — ABNORMAL HIGH (ref 11.5–15.5)
WBC Count: 4 10*3/uL (ref 4.0–10.5)
nRBC: 0 % (ref 0.0–0.2)

## 2022-02-07 LAB — CMP (CANCER CENTER ONLY)
ALT: 5 U/L (ref 0–44)
AST: 10 U/L — ABNORMAL LOW (ref 15–41)
Albumin: 3.5 g/dL (ref 3.5–5.0)
Alkaline Phosphatase: 61 U/L (ref 38–126)
Anion gap: 5 (ref 5–15)
BUN: 17 mg/dL (ref 8–23)
CO2: 25 mmol/L (ref 22–32)
Calcium: 9 mg/dL (ref 8.9–10.3)
Chloride: 104 mmol/L (ref 98–111)
Creatinine: 0.81 mg/dL (ref 0.61–1.24)
GFR, Estimated: >60 mL/min (ref >60)
Glucose, Bld: 98 mg/dL (ref 70–99)
Potassium: 4 mmol/L (ref 3.5–5.1)
Sodium: 134 mmol/L — ABNORMAL LOW (ref 135–145)
Total Bilirubin: 0.2 mg/dL — ABNORMAL LOW (ref 0.3–1.2)
Total Protein: 6.9 g/dL (ref 6.5–8.1)

## 2022-02-07 LAB — RAD ONC ARIA SESSION SUMMARY
Course Elapsed Days: 28
Plan Fractions Treated to Date: 19
Plan Prescribed Dose Per Fraction: 2 Gy
Plan Total Fractions Prescribed: 30
Plan Total Prescribed Dose: 60 Gy
Reference Point Dosage Given to Date: 38 Gy
Reference Point Session Dosage Given: 2 Gy
Session Number: 19

## 2022-02-07 MED ORDER — PALONOSETRON HCL INJECTION 0.25 MG/5ML
0.2500 mg | Freq: Once | INTRAVENOUS | Status: AC
  Administered 2022-02-07: 0.25 mg via INTRAVENOUS
  Filled 2022-02-07: qty 5

## 2022-02-07 MED ORDER — SODIUM CHLORIDE 0.9 % IV SOLN
175.4000 mg | Freq: Once | INTRAVENOUS | Status: AC
  Administered 2022-02-07: 180 mg via INTRAVENOUS
  Filled 2022-02-07: qty 18

## 2022-02-07 MED ORDER — FAMOTIDINE IN NACL 20-0.9 MG/50ML-% IV SOLN
20.0000 mg | Freq: Once | INTRAVENOUS | Status: AC
  Administered 2022-02-07: 20 mg via INTRAVENOUS
  Filled 2022-02-07: qty 50

## 2022-02-07 MED ORDER — SODIUM CHLORIDE 0.9 % IV SOLN: Freq: Once | INTRAVENOUS | Status: AC

## 2022-02-07 MED ORDER — SODIUM CHLORIDE 0.9 % IV SOLN
45.0000 mg/m2 | Freq: Once | INTRAVENOUS | Status: AC
  Administered 2022-02-07: 78 mg via INTRAVENOUS
  Filled 2022-02-07: qty 13

## 2022-02-07 MED ORDER — SODIUM CHLORIDE 0.9 % IV SOLN
10.0000 mg | Freq: Once | INTRAVENOUS | Status: AC
  Administered 2022-02-07: 10 mg via INTRAVENOUS
  Filled 2022-02-07: qty 10

## 2022-02-07 MED ORDER — CETIRIZINE HCL 10 MG/ML IV SOLN
10.0000 mg | Freq: Once | INTRAVENOUS | Status: AC
  Administered 2022-02-07: 10 mg via INTRAVENOUS
  Filled 2022-02-07: qty 1

## 2022-02-07 NOTE — Patient Instructions (Signed)
[facility]  Discharge Instructions: Thank you for choosing [facility] to provide your oncology and hematology care.   If you have a lab appointment with the [facility], please go directly to the [facility] and check in at the registration area.   Wear comfortable clothing and clothing appropriate for easy access to any Portacath or PICC line.   We strive to give you quality time with your provider. You may need to reschedule your appointment if you arrive late (15 or more minutes).  Arriving late affects you and other patients whose appointments are after yours.  Also, if you miss three or more appointments without notifying the office, you may be dismissed from the clinic at the provider's discretion.      For prescription refill requests, have your pharmacy contact our office and allow 72 hours for refills to be completed.    Today you received the following chemotherapy and/or immunotherapy agents: paclitaxel and carboplatin      To help prevent nausea and vomiting after your treatment, we encourage you to take your nausea medication as directed.  BELOW ARE SYMPTOMS THAT SHOULD BE REPORTED IMMEDIATELY: *FEVER GREATER THAN 100.4 F (38 C) OR HIGHER *CHILLS OR SWEATING *NAUSEA AND VOMITING THAT IS NOT CONTROLLED WITH YOUR NAUSEA MEDICATION *UNUSUAL SHORTNESS OF BREATH *UNUSUAL BRUISING OR BLEEDING *URINARY PROBLEMS (pain or burning when urinating, or frequent urination) *BOWEL PROBLEMS (unusual diarrhea, constipation, pain near the anus) TENDERNESS IN MOUTH AND THROAT WITH OR WITHOUT PRESENCE OF ULCERS (sore throat, sores in mouth, or a toothache) UNUSUAL RASH, SWELLING OR PAIN  UNUSUAL VAGINAL DISCHARGE OR ITCHING   Items with * indicate a potential emergency and should be followed up as soon as possible or go to the Emergency Department if any problems should occur.  Please show the CHEMOTHERAPY ALERT CARD or IMMUNOTHERAPY ALERT  CARD at check-in to the Emergency Department and triage nurse.  Should you have questions after your visit or need to cancel or reschedule your appointment, please contact [facility]  Dept: [phone]  and follow the prompts.  Office hours are 8:00 a.m. to 4:30 p.m. Monday - Friday. Please note that voicemails left after 4:00 p.m. may not be returned until the following business day.  We are closed weekends and major holidays. You have access to a nurse at all times for urgent questions. Please call the main number to the clinic Dept: [phone] and follow the prompts.   For any non-urgent questions, you may also contact your provider using MyChart. We now offer e-Visits for anyone 55 and older to request care online for non-urgent symptoms. For details visit [email or web address].   Also download the MyChart app! Go to the app store, search "MyChart", open the app, select [facility], and log in with your MyChart username and password.  Masks are optional in the cancer centers. If you would like for your care team to wear a mask while they are taking care of you, please let them know. You may have one support Cameron Roman who is at least 70 years old accompany you for your appointments.

## 2022-02-08 ENCOUNTER — Telehealth: Payer: Self-pay | Admitting: Physician Assistant

## 2022-02-08 ENCOUNTER — Ambulatory Visit
Admission: RE | Admit: 2022-02-08 | Discharge: 2022-02-08 | Disposition: A | Discharge status: Home / Self Care | Payer: Medicare Other | Source: Ambulatory Visit | Attending: Radiation Oncology | Admitting: Radiation Oncology

## 2022-02-08 ENCOUNTER — Other Ambulatory Visit: Payer: Self-pay

## 2022-02-08 DIAGNOSIS — C3412 Malignant neoplasm of upper lobe, left bronchus or lung: Secondary | ICD-10-CM | POA: Diagnosis not present

## 2022-02-08 DIAGNOSIS — Z510 Encounter for antineoplastic radiation therapy: Secondary | ICD-10-CM | POA: Diagnosis not present

## 2022-02-08 DIAGNOSIS — Z87891 Personal history of nicotine dependence: Secondary | ICD-10-CM | POA: Diagnosis not present

## 2022-02-08 LAB — RAD ONC ARIA SESSION SUMMARY
Course Elapsed Days: 29
Plan Fractions Treated to Date: 20
Plan Prescribed Dose Per Fraction: 2 Gy
Plan Total Fractions Prescribed: 30
Plan Total Prescribed Dose: 60 Gy
Reference Point Dosage Given to Date: 40 Gy
Reference Point Session Dosage Given: 2 Gy
Session Number: 20

## 2022-02-08 NOTE — Telephone Encounter (Signed)
Scheduled appointment per 11/27 los. Left voicemail.

## 2022-02-09 ENCOUNTER — Other Ambulatory Visit: Payer: Self-pay

## 2022-02-09 ENCOUNTER — Ambulatory Visit
Admission: RE | Admit: 2022-02-09 | Discharge: 2022-02-09 | Disposition: A | Discharge status: Home / Self Care | Payer: Medicare Other | Source: Ambulatory Visit | Attending: Radiation Oncology | Admitting: Radiation Oncology

## 2022-02-09 DIAGNOSIS — Z87891 Personal history of nicotine dependence: Secondary | ICD-10-CM | POA: Diagnosis not present

## 2022-02-09 DIAGNOSIS — Z510 Encounter for antineoplastic radiation therapy: Secondary | ICD-10-CM | POA: Diagnosis not present

## 2022-02-09 DIAGNOSIS — C3412 Malignant neoplasm of upper lobe, left bronchus or lung: Secondary | ICD-10-CM | POA: Diagnosis not present

## 2022-02-09 LAB — RAD ONC ARIA SESSION SUMMARY
Course Elapsed Days: 30
Plan Fractions Treated to Date: 21
Plan Prescribed Dose Per Fraction: 2 Gy
Plan Total Fractions Prescribed: 30
Plan Total Prescribed Dose: 60 Gy
Reference Point Dosage Given to Date: 42 Gy
Reference Point Session Dosage Given: 2 Gy
Session Number: 21

## 2022-02-10 ENCOUNTER — Ambulatory Visit
Admission: RE | Admit: 2022-02-10 | Discharge: 2022-02-10 | Disposition: A | Discharge status: Home / Self Care | Payer: Medicare Other | Source: Ambulatory Visit | Attending: Radiation Oncology | Admitting: Radiation Oncology

## 2022-02-10 ENCOUNTER — Other Ambulatory Visit: Payer: Self-pay

## 2022-02-10 DIAGNOSIS — Z510 Encounter for antineoplastic radiation therapy: Secondary | ICD-10-CM | POA: Diagnosis not present

## 2022-02-10 DIAGNOSIS — C3412 Malignant neoplasm of upper lobe, left bronchus or lung: Secondary | ICD-10-CM | POA: Diagnosis not present

## 2022-02-10 DIAGNOSIS — Z87891 Personal history of nicotine dependence: Secondary | ICD-10-CM | POA: Diagnosis not present

## 2022-02-10 LAB — RAD ONC ARIA SESSION SUMMARY
Course Elapsed Days: 31
Plan Fractions Treated to Date: 22
Plan Prescribed Dose Per Fraction: 2 Gy
Plan Total Fractions Prescribed: 30
Plan Total Prescribed Dose: 60 Gy
Reference Point Dosage Given to Date: 44 Gy
Reference Point Session Dosage Given: 2 Gy
Session Number: 22

## 2022-02-11 ENCOUNTER — Other Ambulatory Visit: Payer: Self-pay

## 2022-02-11 ENCOUNTER — Ambulatory Visit
Admission: RE | Admit: 2022-02-11 | Discharge: 2022-02-11 | Disposition: A | Discharge status: Home / Self Care | Payer: Medicare Other | Source: Ambulatory Visit | Attending: Radiation Oncology | Admitting: Radiation Oncology

## 2022-02-11 DIAGNOSIS — C3412 Malignant neoplasm of upper lobe, left bronchus or lung: Secondary | ICD-10-CM | POA: Diagnosis not present

## 2022-02-11 DIAGNOSIS — Z87891 Personal history of nicotine dependence: Secondary | ICD-10-CM | POA: Diagnosis not present

## 2022-02-11 DIAGNOSIS — Z510 Encounter for antineoplastic radiation therapy: Secondary | ICD-10-CM | POA: Diagnosis not present

## 2022-02-11 LAB — RAD ONC ARIA SESSION SUMMARY
Course Elapsed Days: 32
Plan Fractions Treated to Date: 23
Plan Prescribed Dose Per Fraction: 2 Gy
Plan Total Fractions Prescribed: 30
Plan Total Prescribed Dose: 60 Gy
Reference Point Dosage Given to Date: 46 Gy
Reference Point Session Dosage Given: 2 Gy
Session Number: 23

## 2022-02-14 ENCOUNTER — Ambulatory Visit
Admission: RE | Admit: 2022-02-14 | Discharge: 2022-02-14 | Disposition: A | Discharge status: Home / Self Care | Payer: Medicare Other | Source: Ambulatory Visit | Attending: Radiation Oncology | Admitting: Radiation Oncology

## 2022-02-14 ENCOUNTER — Telehealth: Payer: Self-pay | Admitting: Internal Medicine

## 2022-02-14 ENCOUNTER — Other Ambulatory Visit: Payer: Self-pay

## 2022-02-14 DIAGNOSIS — Z510 Encounter for antineoplastic radiation therapy: Secondary | ICD-10-CM | POA: Diagnosis not present

## 2022-02-14 DIAGNOSIS — Z87891 Personal history of nicotine dependence: Secondary | ICD-10-CM | POA: Diagnosis not present

## 2022-02-14 DIAGNOSIS — C3412 Malignant neoplasm of upper lobe, left bronchus or lung: Secondary | ICD-10-CM | POA: Diagnosis not present

## 2022-02-14 LAB — RAD ONC ARIA SESSION SUMMARY
Course Elapsed Days: 35
Plan Fractions Treated to Date: 24
Plan Prescribed Dose Per Fraction: 2 Gy
Plan Total Fractions Prescribed: 30
Plan Total Prescribed Dose: 60 Gy
Reference Point Dosage Given to Date: 48 Gy
Reference Point Session Dosage Given: 2 Gy
Session Number: 24

## 2022-02-14 MED FILL — Dexamethasone Sodium Phosphate Inj 100 MG/10ML: INTRAMUSCULAR | Qty: 1 | Status: AC

## 2022-02-14 NOTE — Telephone Encounter (Signed)
Called patient regarding December and January appointments, patient is notified.

## 2022-02-15 ENCOUNTER — Ambulatory Visit
Admission: RE | Admit: 2022-02-15 | Discharge: 2022-02-15 | Disposition: A | Discharge status: Home / Self Care | Payer: Medicare Other | Source: Ambulatory Visit | Attending: Radiation Oncology | Admitting: Radiation Oncology

## 2022-02-15 ENCOUNTER — Encounter: Payer: Self-pay | Admitting: *Deleted

## 2022-02-15 ENCOUNTER — Inpatient Hospital Stay: Payer: Medicare Other | Attending: Internal Medicine

## 2022-02-15 ENCOUNTER — Other Ambulatory Visit: Payer: Self-pay

## 2022-02-15 ENCOUNTER — Inpatient Hospital Stay: Payer: Medicare Other

## 2022-02-15 VITALS — BP 118/84 | HR 73 | Temp 98.9°F | Resp 18 | Wt 139.0 lb

## 2022-02-15 DIAGNOSIS — Z87891 Personal history of nicotine dependence: Secondary | ICD-10-CM | POA: Diagnosis not present

## 2022-02-15 DIAGNOSIS — Z510 Encounter for antineoplastic radiation therapy: Secondary | ICD-10-CM | POA: Diagnosis not present

## 2022-02-15 DIAGNOSIS — C3412 Malignant neoplasm of upper lobe, left bronchus or lung: Secondary | ICD-10-CM | POA: Diagnosis not present

## 2022-02-15 LAB — CMP (CANCER CENTER ONLY)
ALT: 6 U/L (ref 0–44)
AST: 11 U/L — ABNORMAL LOW (ref 15–41)
Albumin: 3.5 g/dL (ref 3.5–5.0)
Alkaline Phosphatase: 67 U/L (ref 38–126)
Anion gap: 6 (ref 5–15)
BUN: 12 mg/dL (ref 8–23)
CO2: 24 mmol/L (ref 22–32)
Calcium: 9.5 mg/dL (ref 8.9–10.3)
Chloride: 106 mmol/L (ref 98–111)
Creatinine: 0.74 mg/dL (ref 0.61–1.24)
GFR, Estimated: >60 mL/min (ref >60)
Glucose, Bld: 141 mg/dL — ABNORMAL HIGH (ref 70–99)
Potassium: 3.9 mmol/L (ref 3.5–5.1)
Sodium: 136 mmol/L (ref 135–145)
Total Bilirubin: 0.3 mg/dL (ref 0.3–1.2)
Total Protein: 7.2 g/dL (ref 6.5–8.1)

## 2022-02-15 LAB — RAD ONC ARIA SESSION SUMMARY
Course Elapsed Days: 36
Plan Fractions Treated to Date: 25
Plan Prescribed Dose Per Fraction: 2 Gy
Plan Total Fractions Prescribed: 30
Plan Total Prescribed Dose: 60 Gy
Reference Point Dosage Given to Date: 50 Gy
Reference Point Session Dosage Given: 2 Gy
Session Number: 25

## 2022-02-15 LAB — CBC WITH DIFFERENTIAL (CANCER CENTER ONLY)
Abs Immature Granulocytes: 0.01 10*3/uL (ref 0.00–0.07)
Basophils Absolute: 0 10*3/uL (ref 0.0–0.1)
Basophils Relative: 1 %
Eosinophils Absolute: 0 10*3/uL (ref 0.0–0.5)
Eosinophils Relative: 1 %
HCT: 32.8 % — ABNORMAL LOW (ref 39.0–52.0)
Hemoglobin: 11.3 g/dL — ABNORMAL LOW (ref 13.0–17.0)
Immature Granulocytes: 0 %
Lymphocytes Relative: 32 %
Lymphs Abs: 1.2 10*3/uL (ref 0.7–4.0)
MCH: 32 pg (ref 26.0–34.0)
MCHC: 34.5 g/dL (ref 30.0–36.0)
MCV: 92.9 fL (ref 80.0–100.0)
Monocytes Absolute: 0.5 10*3/uL (ref 0.1–1.0)
Monocytes Relative: 12 %
Neutro Abs: 2.1 10*3/uL (ref 1.7–7.7)
Neutrophils Relative %: 54 %
Platelet Count: 177 10*3/uL (ref 150–400)
RBC: 3.53 MIL/uL — ABNORMAL LOW (ref 4.22–5.81)
RDW: 16.5 % — ABNORMAL HIGH (ref 11.5–15.5)
WBC Count: 3.9 10*3/uL — ABNORMAL LOW (ref 4.0–10.5)
nRBC: 0 % (ref 0.0–0.2)

## 2022-02-15 MED ORDER — SODIUM CHLORIDE 0.9 % IV SOLN: Freq: Once | INTRAVENOUS | Status: AC

## 2022-02-15 MED ORDER — SODIUM CHLORIDE 0.9 % IV SOLN
45.0000 mg/m2 | Freq: Once | INTRAVENOUS | Status: AC
  Administered 2022-02-15: 78 mg via INTRAVENOUS
  Filled 2022-02-15: qty 13

## 2022-02-15 MED ORDER — SODIUM CHLORIDE 0.9 % IV SOLN
175.4000 mg | Freq: Once | INTRAVENOUS | Status: AC
  Administered 2022-02-15: 180 mg via INTRAVENOUS
  Filled 2022-02-15: qty 18

## 2022-02-15 MED ORDER — FAMOTIDINE IN NACL 20-0.9 MG/50ML-% IV SOLN
20.0000 mg | Freq: Once | INTRAVENOUS | Status: AC
  Administered 2022-02-15: 20 mg via INTRAVENOUS
  Filled 2022-02-15: qty 50

## 2022-02-15 MED ORDER — SODIUM CHLORIDE 0.9 % IV SOLN
10.0000 mg | Freq: Once | INTRAVENOUS | Status: AC
  Administered 2022-02-15: 10 mg via INTRAVENOUS
  Filled 2022-02-15: qty 10

## 2022-02-15 MED ORDER — PALONOSETRON HCL INJECTION 0.25 MG/5ML
0.2500 mg | Freq: Once | INTRAVENOUS | Status: AC
  Administered 2022-02-15: 0.25 mg via INTRAVENOUS
  Filled 2022-02-15: qty 5

## 2022-02-15 MED ORDER — CETIRIZINE HCL 10 MG/ML IV SOLN
10.0000 mg | Freq: Once | INTRAVENOUS | Status: AC
  Administered 2022-02-15: 10 mg via INTRAVENOUS
  Filled 2022-02-15: qty 1

## 2022-02-15 NOTE — Research (Signed)
Trial:PACIFIC-9 [id number]: A Phase III, double-blind, placebo-controlled, Randomised, Multicentre, International Study of Durvalumab Plus Oleclumab and Durvalumab Plus Monalizumab in Patients With Locally Advanced (Stage III), Unresectable Non-small Cell Lung Cancer (NSCLC) Who Have Not Progressed Following Definitive, Platinum-Based Concurrent Chemoradiation Therapy  Part 1 Screening: Patient Cameron Roman was identified by Dr. Julien Nordmann as a potential candidate for the above listed study.  This Clinical Research Nurse met with Cameron Roman, [id number], on 02/15/22 in a manner and location that ensures patient privacy to discuss participation in the above listed research study.  Patient is Unaccompanied.  A copy of the Part 1 Screening informed consent document and separate HIPAA Authorization was provided to the patient.  Patient reads, speaks, and understands [place].   Patient was provided with the business card of this Nurse and encouraged to contact the research team with any questions.  Approximately 5 minutes were spent with the patient reviewing the informed consent documents.  Patient was provided the option of taking informed consent documents home to review and was encouraged to review at their convenience with their support network, including other care providers. Patient took the consent documents home to review.  Patient agreed to research nurse contacting patient to follow up on Monday 02/21/22. Encouraged patient to call if any questions before Monday.  Foye Spurling, BSN, RN, [facility] Nurse II [phone] 02/15/2022 1:47 PM

## 2022-02-16 ENCOUNTER — Ambulatory Visit
Admission: RE | Admit: 2022-02-16 | Discharge: 2022-02-16 | Disposition: A | Discharge status: Home / Self Care | Payer: Medicare Other | Source: Ambulatory Visit | Attending: Radiation Oncology | Admitting: Radiation Oncology

## 2022-02-16 ENCOUNTER — Other Ambulatory Visit: Payer: Self-pay

## 2022-02-16 DIAGNOSIS — G894 Chronic pain syndrome: Secondary | ICD-10-CM | POA: Diagnosis not present

## 2022-02-16 DIAGNOSIS — C3490 Malignant neoplasm of unspecified part of unspecified bronchus or lung: Secondary | ICD-10-CM | POA: Diagnosis not present

## 2022-02-16 DIAGNOSIS — Z87891 Personal history of nicotine dependence: Secondary | ICD-10-CM | POA: Diagnosis not present

## 2022-02-16 DIAGNOSIS — Z6820 Body mass index (BMI) 20.0-20.9, adult: Secondary | ICD-10-CM | POA: Diagnosis not present

## 2022-02-16 DIAGNOSIS — M25551 Pain in right hip: Secondary | ICD-10-CM | POA: Diagnosis not present

## 2022-02-16 DIAGNOSIS — Z510 Encounter for antineoplastic radiation therapy: Secondary | ICD-10-CM | POA: Diagnosis not present

## 2022-02-16 DIAGNOSIS — M5136 Other intervertebral disc degeneration, lumbar region: Secondary | ICD-10-CM | POA: Diagnosis not present

## 2022-02-16 DIAGNOSIS — C3412 Malignant neoplasm of upper lobe, left bronchus or lung: Secondary | ICD-10-CM | POA: Diagnosis not present

## 2022-02-16 LAB — RAD ONC ARIA SESSION SUMMARY
Course Elapsed Days: 37
Plan Fractions Treated to Date: 26
Plan Prescribed Dose Per Fraction: 2 Gy
Plan Total Fractions Prescribed: 30
Plan Total Prescribed Dose: 60 Gy
Reference Point Dosage Given to Date: 52 Gy
Reference Point Session Dosage Given: 2 Gy
Session Number: 26

## 2022-02-17 ENCOUNTER — Ambulatory Visit
Admission: RE | Admit: 2022-02-17 | Discharge: 2022-02-17 | Disposition: A | Discharge status: Home / Self Care | Payer: Medicare Other | Source: Ambulatory Visit | Attending: Radiation Oncology | Admitting: Radiation Oncology

## 2022-02-17 ENCOUNTER — Other Ambulatory Visit: Payer: Self-pay

## 2022-02-17 DIAGNOSIS — Z87891 Personal history of nicotine dependence: Secondary | ICD-10-CM | POA: Diagnosis not present

## 2022-02-17 DIAGNOSIS — Z510 Encounter for antineoplastic radiation therapy: Secondary | ICD-10-CM | POA: Diagnosis not present

## 2022-02-17 DIAGNOSIS — C3412 Malignant neoplasm of upper lobe, left bronchus or lung: Secondary | ICD-10-CM | POA: Diagnosis not present

## 2022-02-17 LAB — RAD ONC ARIA SESSION SUMMARY
Course Elapsed Days: 38
Plan Fractions Treated to Date: 27
Plan Prescribed Dose Per Fraction: 2 Gy
Plan Total Fractions Prescribed: 30
Plan Total Prescribed Dose: 60 Gy
Reference Point Dosage Given to Date: 54 Gy
Reference Point Session Dosage Given: 2 Gy
Session Number: 27

## 2022-02-18 ENCOUNTER — Other Ambulatory Visit: Payer: Self-pay

## 2022-02-18 ENCOUNTER — Ambulatory Visit
Admission: RE | Admit: 2022-02-18 | Discharge: 2022-02-18 | Disposition: A | Discharge status: Home / Self Care | Payer: Medicare Other | Source: Ambulatory Visit | Attending: Radiation Oncology | Admitting: Radiation Oncology

## 2022-02-18 ENCOUNTER — Encounter: Payer: Self-pay | Admitting: Radiation Oncology

## 2022-02-18 DIAGNOSIS — C3412 Malignant neoplasm of upper lobe, left bronchus or lung: Secondary | ICD-10-CM | POA: Diagnosis not present

## 2022-02-18 DIAGNOSIS — Z87891 Personal history of nicotine dependence: Secondary | ICD-10-CM | POA: Diagnosis not present

## 2022-02-18 DIAGNOSIS — Z510 Encounter for antineoplastic radiation therapy: Secondary | ICD-10-CM | POA: Diagnosis not present

## 2022-02-18 LAB — RAD ONC ARIA SESSION SUMMARY
Course Elapsed Days: 39
Plan Fractions Treated to Date: 28
Plan Prescribed Dose Per Fraction: 2 Gy
Plan Total Fractions Prescribed: 30
Plan Total Prescribed Dose: 60 Gy
Reference Point Dosage Given to Date: 56 Gy
Reference Point Session Dosage Given: 2 Gy
Session Number: 28

## 2022-02-21 ENCOUNTER — Other Ambulatory Visit: Payer: Self-pay

## 2022-02-21 ENCOUNTER — Ambulatory Visit: Payer: Medicare Other

## 2022-02-21 ENCOUNTER — Encounter: Payer: Medicare Other | Admitting: Nutrition

## 2022-02-21 ENCOUNTER — Ambulatory Visit: Payer: Medicare Other | Admitting: Physician Assistant

## 2022-02-21 ENCOUNTER — Other Ambulatory Visit: Payer: Medicare Other

## 2022-02-21 ENCOUNTER — Telehealth: Payer: Self-pay | Admitting: *Deleted

## 2022-02-21 DIAGNOSIS — C3412 Malignant neoplasm of upper lobe, left bronchus or lung: Secondary | ICD-10-CM

## 2022-02-21 NOTE — Progress Notes (Unsigned)
Symptom Management Consult note [place]    Patient Care Team: Dettinger, [name], MD as PCP - General (Family Medicine) Evans Lance, MD as PCP - Electrophysiology (Cardiology) Lavera Guise, [facility] as Pharmacist (Family Medicine)    Name of the patient: Cameron Roman  [id number]  09/04/1950   Date of visit: 02/22/2022   Chief Complaint/Reason for visit: nausea and dizziness   Current Therapy: concurrent chemo/radiation with carboplatin for an AUC of 2 and paclitaxel 45 mg/m2 weekly.   Last treatment:  Day 1   Cycle 7 on 02/15/22   ASSESSMENT & PLAN: Patient is a 71 y.o. 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 Julien Nordmann.  I have viewed most recent oncology note and lab work.    #)Primary squamous cell carcinoma of left lung stage IIIA - Patient does not plan to undergo last 2 radiation appointments secondary to feeling so poorly and let radiation staff know of this change. - Next appointment with medical oncologist is 03/22/22.   #) Decreased p.o. 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 No signs of mucositis. He has viscous lidocaine and Carafate at home. Patient's weight is down 6 pounds in 7 days. -Patient clinically appears dehydrated.  He was tachycardic to 110 on arrival. CBC and CMP are overall unremarkable. Patient given IV pepcid and IVF here in clinic. -Vitals were rechecked after IV fluids and tachycardia resolved.  Patient able to tolerate sips of water. Unable to give patient narcotic pain medication in clinic as he has to drive himself home. -Scheduling message sent for patient to get IVF weekly x 4 for supportive care.  Referral also sent for nutrition services. -He has history of GERD and peptic stricture requiring esophageal dilation so referral sent to GI for follow-up incase this is contributing to his current symptoms.  #) Pain management -Patient  gets PO hydrocodone from PCP for chronic pain. I called and discussed patient with Collene Mares PA-C at [facility] who is agreeable with plan for me to prescribe Hycet now as he is having difficulty swallowing pills. He is agreeable with this plan and will refill PO hydrocodone when needed.  - I have reviewed the PDMP during this encounter.  Strict ED precautions discussed should symptoms worsen.  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 male with oncologic history as above presenting to [facility] today with chief complaint of pain and decreased oral intake.  Patient presents unaccompanied to clinic today.  Patient states he has a history of GERD and esophageal stricture requiring dilation so it is not uncommon for him to have pain when eating or drinking.  He states over the last 10 days his pain has significantly worsened.  He states it feels like he is swallowing glass.  He rates the pain 7 out of 10 in severity.  Patient is having difficulty swallowing his medications including his  pain pill.  He is not planning to undergo his last 2 radiation treatments because the pain is too much to handle.  He has informed radiation staff of this.  Patient states he feels lightheaded with position changes although felt well enough to drive himself here to clinic today.  He does have a dry cough.  He has not tried any over-the-counter medications for his symptoms.  He denies any sick contacts.  He notes that his urine is slightly darker in color  than usual.  He denies any fever, chills, hemoptysis or hematemesis, abdominal pain, nausea or vomiting.   Per chart review from last med onc visit 02/07/22: The patient has chronic chest pain/burning/back and neck pain which predates his malignancy he sees pain management.  He is prescribed gabapentin and hydrocodone. Today, he reports continued burning in his chest but states the medication helps. He is prescribed carafate and viscous lidocaine.  ROS  All other systems are reviewed and are negative for acute change except as noted in the HPI.    Allergies  Allergen Reactions   Percocet [Oxycodone-Acetaminophen] Itching     Past Medical History:  Diagnosis Date   Anxiety    Arthritis    Atrial fibrillation (HCC)    Chronic back pain    buldging disc   Chronic neck pain    ruptured disc   Cough    hx smoking   Diverticulosis    Esophageal stricture    GERD (gastroesophageal reflux disease)    takes Prilosec daily   H/O hiatal hernia    Hearing loss    Hepatitis-C 2017   treated and cured per patient   Hiatal hernia    History of colon polyps    Insomnia    takes Elavil nightly   Lipoma of abdominal wall 02/11/2011   Lung cancer ([place])    Stroke ([place])    x 2   Type 2 diabetes mellitus ([place]) 07/22/2020   patient denies DM, per MD note pt is pre-diabetic     Past Surgical History:  Procedure Laterality Date   ANTERIOR CERVICAL DECOMP/DISCECTOMY FUSION  04/12/2011   Procedure: ANTERIOR CERVICAL DECOMPRESSION/DISCECTOMY FUSION 2 LEVELS;  Surgeon: Peggyann Shoals, MD;  Location: [place] [facility];  Service: Neurosurgery;  Laterality: N/A;  exploration of Cervical four - seven  fusion with redo Cervical six- seven, cervical three- four anterior cervical decompression with fusion interbody prothesis plating and bonegraft and C34 anterior cervical decompression with inte   APPENDECTOMY     at age 20   BRONCHIAL BIOPSY  11/30/2021   Procedure: BRONCHIAL BIOPSIES;  Surgeon:  Garner Nash, DO;  Location: [place] [facility];  Service: Pulmonary;;   BRONCHIAL BRUSHINGS  11/30/2021   Procedure: BRONCHIAL BRUSHINGS;  Surgeon: Garner Nash, DO;  Location: [place] [facility];  Service: Pulmonary;;   CARDIAC CATHETERIZATION  06/03/2004   COLONOSCOPY     EP IMPLANTABLE DEVICE N/A 09/14/2015   Procedure: Loop Recorder Insertion;  Surgeon: Evans Lance, MD;  Location: [place] [facility];  Service: Cardiovascular;  Laterality: N/A;   FINE NEEDLE ASPIRATION  11/30/2021   Procedure: FINE NEEDLE ASPIRATION (FNA) LINEAR;  Surgeon: Garner Nash, DO;  Location: [place] [facility];  Service: Pulmonary;;   HAND SURGERY Right 03/15/2003   right x 3   INNER EAR SURGERY Bilateral    bil;titanium in both ears   lower back surgery  03/15/2007   had plates and screws inserted   NECK SURGERY  03/15/2007  had insertion of  plates and screws   TEE WITHOUT CARDIOVERSION N/A 09/14/2015   Procedure: TRANSESOPHAGEAL ECHOCARDIOGRAM (TEE);  Surgeon: Thayer Headings, MD;  Location: [place];  Service: Cardiovascular;  Laterality: N/A;   TRANSFORAMINAL LUMBAR INTERBODY FUSION (TLIF) WITH PEDICLE SCREW FIXATION 1 LEVEL Left 09/27/2019   Procedure: Left Lumbar 5 Sacral 1 Transforaminal lumbar interbody fusion with exploration/removal of adjacent level hardware;  Surgeon: Erline Levine, MD;  Location: [place];  Service: Neurosurgery;  Laterality: Left;  3C/RM 19   VIDEO BRONCHOSCOPY WITH ENDOBRONCHIAL ULTRASOUND N/A 11/30/2021   Procedure: VIDEO BRONCHOSCOPY WITH ENDOBRONCHIAL ULTRASOUND;  Surgeon: Garner Nash, DO;  Location: [place];  Service: Pulmonary;  Laterality: N/A;    Social History   Socioeconomic History   Marital status: Widowed    Spouse name: Pamala Hurry   Number of children: 2   Years of education: 8th Grade   Highest education level: 8th grade  Occupational History   Occupation: disabled    Employer: DISABLED  Tobacco Use   Smoking status: Former    Packs/day: 1.00     Years: 41.00    Total pack years: 41.00    Types: Cigarettes    Quit date: 06/29/2002    Years since quitting: 19.6   Smokeless tobacco: Never   Tobacco comments:    Pt quit smoking in 2004. 10/21/21  Vaping Use   Vaping Use: Never used  Substance and Sexual Activity   Alcohol use: Not Currently    Alcohol/week: 1.0 standard drink of alcohol    Types: 1 Cans of beer per week   Drug use: No   Sexual activity: Yes    Birth control/protection: None  Other Topics Concern   Not on file  Social History Narrative   Lives alone, his wife passed away 02/08/2019.  Has 2 children.     Currently does not work.  On disability for wrist pain since early 2000s.   Formerly a Administrator.   Social Determinants of Health   Financial Resource Strain: High Risk (11/19/2020)   Overall Financial Resource Strain (CARDIA)    Difficulty of Paying Living Expenses: Hard  Food Insecurity: No Food Insecurity (11/19/2020)   Hunger Vital Sign    Worried About Running Out of Food in the Last Year: Never true    Ran Out of Food in the Last Year: Never true  Transportation Needs: No Transportation Needs (11/19/2020)   PRAPARE - Hydrologist (Medical): No    Lack of Transportation (Non-Medical): No  Physical Activity: Insufficiently Active (11/19/2020)   Exercise Vital Sign    Days of Exercise per Week: 7 days    Minutes of Exercise per Session: 20 min  Stress: No Stress Concern Present (11/19/2020)   [place]    Feeling of Stress : Only a little  Social Connections: Socially Isolated (11/19/2020)   Social Connection and Isolation Panel [NHANES]    Frequency of Communication with Friends and Family: More than three times a week    Frequency of Social Gatherings with Friends and Family: More than three times a week    Attends Religious Services: Never    Marine scientist or Organizations: No    Attends Theatre manager Meetings: Never    Marital Status: Widowed  Intimate Partner Violence: Not At Risk (11/19/2020)   Humiliation, Afraid, Rape, and Kick questionnaire    Fear of Current or Ex-Partner: No  Emotionally Abused: No    Physically Abused: No    Sexually Abused: No    Family History  Problem Relation Age of Onset   Other Mother        Perforated bowel   Cancer Father    Colon cancer Father    Cancer Brother        GI   Colon cancer Maternal Grandmother    Colon cancer Maternal Grandfather    Healthy Daughter    Healthy Son    Heart disease Maternal Uncle    Stroke Maternal Uncle    Anesthesia problems Neg Hx    Hypotension Neg Hx    Malignant hyperthermia Neg Hx    Pseudochol deficiency Neg Hx      Current Outpatient Medications:    HYDROcodone-acetaminophen (HYCET) 7.5-325 mg/15 ml solution, Take 10 mLs by mouth 4 (four) times daily as needed for moderate pain., Disp: 120 mL, Rfl: 0   apixaban (ELIQUIS) 5 MG TABS tablet, TAKE ONE TABLET BY MOUTH TWICE DAILY, Disp: 60 tablet, Rfl: 5   fluticasone (FLONASE) 50 MCG/ACT nasal spray, Place 1 spray into the nose in the morning., Disp: , Rfl:    gabapentin (NEURONTIN) 300 MG capsule, Take 1 capsule (300 mg total) by mouth 3 (three) times daily. Take one capsule twice to three times daily (Patient taking differently: Take 300 mg by mouth 2 (two) times daily.), Disp: 270 capsule, Rfl: 3   lidocaine (XYLOCAINE) 2 % solution, Use as directed 15 mLs in the mouth or throat 3 (three) times daily with meals. Swallow 1/2 hour prior to meals, Disp: 100 mL, Rfl: 2   Omega-3 Fatty Acids (FISH OIL) 1000 MG CAPS, Take 1,000 mg by mouth in the morning and at bedtime. , Disp: , Rfl:    omeprazole (PRILOSEC) 20 MG capsule, Take 1 capsule (20 mg total) by mouth daily., Disp: 90 capsule, Rfl: 3   pantoprazole (PROTONIX) 40 MG tablet, Take 1 tablet (40 mg total) by mouth daily., Disp: 90 tablet, Rfl: 3   pravastatin (PRAVACHOL) 20 MG tablet, Take 1  tablet (20 mg total) by mouth daily. Take one tablet once daily (Patient taking differently: Take 20 mg by mouth at bedtime. Take one tablet once daily), Disp: 90 tablet, Rfl: 3   prochlorperazine (COMPAZINE) 10 MG tablet, Take 1 tablet (10 mg total) by mouth every 6 (six) hours as needed for nausea or vomiting., Disp: 30 tablet, Rfl: 1   sucralfate (CARAFATE) 1 g tablet, Take 1 tablet (1 g total) by mouth 4 (four) times daily -  with meals and at bedtime. Crush and dissolve in 10 mL of warm water prior to swallowing, Disp: 120 tablet, Rfl: 1   tamsulosin (FLOMAX) 0.4 MG CAPS capsule, Take 1 capsule (0.4 mg total) by mouth daily., Disp: 90 capsule, Rfl: 3  Current Facility-Administered Medications:    methylPREDNISolone acetate (DEPO-MEDROL) injection 80 mg, 80 mg, Intramuscular, Once, Dettinger, [name], MD  Facility-Administered Medications Ordered in Other Visits:    famotidine (PEPCID) 20-0.9 MG/50ML-% IVPB, , , ,   PHYSICAL EXAM: ECOG FS:1 - Symptomatic but completely ambulatory    Vitals:   02/22/22 1000 02/22/22 1156  BP: (!) 152/84 130/86  Pulse: (!) 110 97  Resp: 19 18  Temp: 97.7 F (36.5 C)   SpO2: 99%   Weight: 133 lb 11.2 oz (60.6 kg)    Physical Exam Vitals and nursing note reviewed.  Constitutional:      Appearance: He is cachectic. He  is not ill-appearing or toxic-appearing.     Comments: Handling secretions without difficulty  HENT:     Head: Normocephalic.     Nose: Nose normal.     Mouth/Throat:     Mouth: Mucous membranes are dry.     Pharynx: No posterior oropharyngeal erythema.     Comments: No signs of mucositis Eyes:     Conjunctiva/sclera: Conjunctivae normal.  Neck:     Vascular: No JVD.  Cardiovascular:     Rate and Rhythm: Normal rate and regular rhythm.     Pulses: Normal pulses.     Heart sounds: Normal heart sounds.  Pulmonary:     Effort: Pulmonary effort is normal.     Breath sounds: Normal breath sounds.  Abdominal:     General:  Bowel sounds are normal. There is no distension.     Palpations: Abdomen is soft. There is no mass.     Tenderness: There is no abdominal tenderness. There is no guarding or rebound.     Hernia: No hernia is present.  Musculoskeletal:        General: Normal range of motion.     Cervical back: Normal range of motion.  Skin:    General: Skin is warm and dry.     Capillary Refill: Capillary refill takes less than 2 seconds.  Neurological:     Mental Status: He is oriented to person, place, and time.        LABORATORY DATA: I have reviewed the data as listed    Latest Ref Rng & Units 02/22/2022    9:51 AM 02/15/2022   10:51 AM 02/07/2022   11:59 AM  CBC  WBC 4.0 - 10.5 K/uL 3.4  3.9  4.0   Hemoglobin 13.0 - 17.0 g/dL 11.7  11.3  9.9   Hematocrit 39.0 - 52.0 % 33.5  32.8  28.6   Platelets 150 - 400 K/uL 247  177  204         Latest Ref Rng & Units 02/22/2022    9:51 AM 02/15/2022   10:51 AM 02/07/2022   11:59 AM  CMP  Glucose 70 - 99 mg/dL 154  141  98   BUN 8 - 23 mg/dL 20  12  17    Creatinine 0.61 - 1.24 mg/dL 0.83  0.74  0.81   Sodium 135 - 145 mmol/L 135  136  134   Potassium 3.5 - 5.1 mmol/L 4.1  3.9  4.0   Chloride 98 - 111 mmol/L 102  106  104   CO2 22 - 32 mmol/L 26  24  25    Calcium 8.9 - 10.3 mg/dL 9.9  9.5  9.0   Total Protein 6.5 - 8.1 g/dL 7.5  7.2  6.9   Total Bilirubin 0.3 - 1.2 mg/dL 0.5  0.3  0.2   Alkaline Phos 38 - 126 U/L 65  67  61   AST 15 - 41 U/L 11  11  10    ALT 0 - 44 U/L 6  6  5         RADIOGRAPHIC STUDIES (from last 24 hours if applicable) I have personally reviewed the radiological images as listed and agreed with the findings in the report. No results found.      Visit Diagnosis: 1. Primary squamous cell carcinoma of bronchus of left upper lobe ([place])   2. At risk for dehydration due to poor fluid intake   3. Gastroesophageal reflux disease, unspecified whether esophagitis present  Orders Placed This Encounter   Procedures   Ambulatory Referral to [facility] Nutrition    Referral Priority:   Urgent    Referral Type:   Consultation    Referral Reason:   Specialty Services Required    Number of Visits Requested:   1   Ambulatory referral to Gastroenterology    Referral Priority:   Urgent    Referral Type:   Consultation    Referral Reason:   Specialty Services Required    Number of Visits Requested:   1    All questions were answered. The patient knows to call the clinic with any problems, questions or concerns. No barriers to learning was detected.  I have spent a total of 30 minutes minutes of face-to-face and non-face-to-face time, preparing to see the patient, obtaining and/or reviewing separately obtained history, performing a medically appropriate examination, counseling and educating the patient, ordering tests, documenting clinical information in the electronic health record, and care coordination (communications with other health care professionals or caregivers).    Thank you for allowing me to participate in the care of this patient.    Barrie Folk, PA-C Department of Hematology/Oncology [facility] at [facility] Phone: [phone]  [phone]    02/22/2022 2:28 PM

## 2022-02-21 NOTE — Telephone Encounter (Signed)
PACIFIC-9 [id number]: A Phase III, double-blind, placebo-controlled, Randomised, Multicentre, International Study of Durvalumab Plus Oleclumab and Durvalumab Plus Monalizumab in Patients With Locally Advanced (Stage III), Unresectable Non-small Cell Lung Cancer (NSCLC) Who Have Not Progressed Following Definitive, Platinum-Based Concurrent Chemoradiation Therapy  Called patient to follow up on the above study and see if he has any questions.  Patient states he is not interested in participating in a study as he doesn't want any more treatment.  He reports he has been unable to eat or drink for the past few days with nausea and a "burned throat."  He says he feels dizzy when he stands up and he isn't going to his last 2 RT treatments.   Notified [facility] and Dr. Worthy Flank nurse about patient's symptoms. No appointments available today in [facility] but they can see patient tomorrow at 10 am.  I called patient back and he agreed to come in tomorrow at 10 am.  Instructed him to go to the ED today if his condition worsens.  He verbalized understanding.  He reiterated that he "can't take any more treatment" and he is not coming for his last 2 Radiation treatments.  RadOnc notified.  Dr. Julien Nordmann also notified of above and that patient does not want to participate in the above study.  Foye Spurling, BSN, RN, [place] Nurse II [phone] 02/21/2022 11:56 AM

## 2022-02-21 NOTE — Progress Notes (Signed)
Lab orders entered for [facility] visit.

## 2022-02-22 ENCOUNTER — Inpatient Hospital Stay: Payer: Medicare Other | Admitting: Licensed Clinical Social Worker

## 2022-02-22 ENCOUNTER — Telehealth: Payer: Self-pay | Admitting: *Deleted

## 2022-02-22 ENCOUNTER — Other Ambulatory Visit: Payer: Self-pay

## 2022-02-22 ENCOUNTER — Ambulatory Visit: Payer: Medicare Other

## 2022-02-22 ENCOUNTER — Inpatient Hospital Stay: Payer: Medicare Other

## 2022-02-22 ENCOUNTER — Encounter: Payer: Medicare Other | Admitting: Dietician

## 2022-02-22 ENCOUNTER — Inpatient Hospital Stay (HOSPITAL_BASED_OUTPATIENT_CLINIC_OR_DEPARTMENT_OTHER): Payer: Medicare Other | Admitting: Physician Assistant

## 2022-02-22 VITALS — BP 130/86 | HR 97 | Temp 97.7°F | Resp 18 | Wt 133.7 lb

## 2022-02-22 DIAGNOSIS — E860 Dehydration: Secondary | ICD-10-CM | POA: Insufficient documentation

## 2022-02-22 DIAGNOSIS — C3412 Malignant neoplasm of upper lobe, left bronchus or lung: Secondary | ICD-10-CM | POA: Diagnosis not present

## 2022-02-22 DIAGNOSIS — Z9189 Other specified personal risk factors, not elsewhere classified: Secondary | ICD-10-CM

## 2022-02-22 DIAGNOSIS — K219 Gastro-esophageal reflux disease without esophagitis: Secondary | ICD-10-CM | POA: Diagnosis not present

## 2022-02-22 DIAGNOSIS — Z79899 Other long term (current) drug therapy: Secondary | ICD-10-CM | POA: Diagnosis not present

## 2022-02-22 DIAGNOSIS — Z87891 Personal history of nicotine dependence: Secondary | ICD-10-CM | POA: Diagnosis not present

## 2022-02-22 DIAGNOSIS — Z5111 Encounter for antineoplastic chemotherapy: Secondary | ICD-10-CM | POA: Insufficient documentation

## 2022-02-22 LAB — CMP (CANCER CENTER ONLY)
ALT: 6 U/L (ref 0–44)
AST: 11 U/L — ABNORMAL LOW (ref 15–41)
Albumin: 3.8 g/dL (ref 3.5–5.0)
Alkaline Phosphatase: 65 U/L (ref 38–126)
Anion gap: 7 (ref 5–15)
BUN: 20 mg/dL (ref 8–23)
CO2: 26 mmol/L (ref 22–32)
Calcium: 9.9 mg/dL (ref 8.9–10.3)
Chloride: 102 mmol/L (ref 98–111)
Creatinine: 0.83 mg/dL (ref 0.61–1.24)
GFR, Estimated: >60 mL/min (ref >60)
Glucose, Bld: 154 mg/dL — ABNORMAL HIGH (ref 70–99)
Potassium: 4.1 mmol/L (ref 3.5–5.1)
Sodium: 135 mmol/L (ref 135–145)
Total Bilirubin: 0.5 mg/dL (ref 0.3–1.2)
Total Protein: 7.5 g/dL (ref 6.5–8.1)

## 2022-02-22 LAB — CBC WITH DIFFERENTIAL (CANCER CENTER ONLY)
Abs Immature Granulocytes: 0.01 10*3/uL (ref 0.00–0.07)
Basophils Absolute: 0 10*3/uL (ref 0.0–0.1)
Basophils Relative: 1 %
Eosinophils Absolute: 0 10*3/uL (ref 0.0–0.5)
Eosinophils Relative: 0 %
HCT: 33.5 % — ABNORMAL LOW (ref 39.0–52.0)
Hemoglobin: 11.7 g/dL — ABNORMAL LOW (ref 13.0–17.0)
Immature Granulocytes: 0 %
Lymphocytes Relative: 25 %
Lymphs Abs: 0.8 10*3/uL (ref 0.7–4.0)
MCH: 32.8 pg (ref 26.0–34.0)
MCHC: 34.9 g/dL (ref 30.0–36.0)
MCV: 93.8 fL (ref 80.0–100.0)
Monocytes Absolute: 0.4 10*3/uL (ref 0.1–1.0)
Monocytes Relative: 12 %
Neutro Abs: 2.1 10*3/uL (ref 1.7–7.7)
Neutrophils Relative %: 62 %
Platelet Count: 247 10*3/uL (ref 150–400)
RBC: 3.57 MIL/uL — ABNORMAL LOW (ref 4.22–5.81)
RDW: 17 % — ABNORMAL HIGH (ref 11.5–15.5)
WBC Count: 3.4 10*3/uL — ABNORMAL LOW (ref 4.0–10.5)
nRBC: 0 % (ref 0.0–0.2)

## 2022-02-22 MED ORDER — FAMOTIDINE IN NACL 20-0.9 MG/50ML-% IV SOLN
INTRAVENOUS | Status: AC
  Filled 2022-02-22: qty 50

## 2022-02-22 MED ORDER — SODIUM CHLORIDE 0.9 % IV SOLN: Freq: Once | INTRAVENOUS | Status: AC

## 2022-02-22 MED ORDER — HYDROCODONE-ACETAMINOPHEN 7.5-325 MG/15ML PO SOLN: 10.0000 mL | Freq: Four times a day (QID) | ORAL | 0 refills | Status: DC | PRN

## 2022-02-22 MED ORDER — FAMOTIDINE IN NACL 20-0.9 MG/50ML-% IV SOLN
20.0000 mg | Freq: Once | INTRAVENOUS | Status: AC
  Administered 2022-02-22: 20 mg via INTRAVENOUS

## 2022-02-22 NOTE — Progress Notes (Signed)
[place] [facility]  Initial Assessment   Cameron Roman is a 71 y.o. year old male seen in symptom management . Clinical Social Work was referred by medical provider for assessment of psychosocial needs.   SDOH (Social Determinants of Health) assessments performed: Yes SDOH Interventions    Flowsheet Row Clinical Support from 11/19/2020 in [place] Interventions   Food Insecurity Interventions Intervention Not Indicated  Housing Interventions Intervention Not Indicated  Transportation Interventions Intervention Not Indicated  Financial Strain Interventions [id number] Referral  Physical Activity Interventions Intervention Not Indicated  Stress Interventions Intervention Not Indicated  Social Connections Interventions Intervention Not Indicated       SDOH Screenings   Food Insecurity: No Food Insecurity (11/19/2020)  Housing: Low Risk  (11/19/2020)  Transportation Needs: No Transportation Needs (11/19/2020)  Alcohol Screen: Low Risk  (11/19/2020)  Depression (PHQ2-9): Low Risk  (11/22/2021)  Financial Resource Strain: High Risk (11/19/2020)  Physical Activity: Insufficiently Active (11/19/2020)  Social Connections: Socially Isolated (11/19/2020)  Stress: No Stress Concern Present (11/19/2020)  Tobacco Use: Medium Risk (02/07/2022)     Distress Screen completed: No     No data to display            Family/Social Information:  Housing Arrangement: patient lives alone. Pt lost his wife 3 years ago to lung cancer and has resided alone since.  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 Family members/support persons in your life? Pt's daughter resides 30 minutes away and his son resides in [place]. Transportation concerns: no  Employment: Retired .  Income source: Paediatric nurse concerns: Yes, current concerns- pt has had a difficult time paying the co-pay for his medication Type of concern: Medical bills Food  access concerns: no Religious or spiritual practice: Yes-[employer] Currently in place:  none  Coping/ Adjustment to diagnosis: Patient understands treatment plan and what happens next? yes Concerns about diagnosis and/or treatment:  Pt states he has been tolerating treatment well w/ the exception of dehydration which he is hoping will improve once he completes radiation and is not having concurrent treatment. Patient reported stressors: Veterinary surgeon and/or priorities: Pt's priority is to continue treatment w/ the hope of positive results. Patient enjoys being outside Current coping skills/ strengths: Capable of independent living , Motivation for treatment/growth , and Physical Health     SUMMARY: Current SDOH Barriers:  Financial constraints related to co-pays not covered by Medicare  Clinical Social Work Clinical Goal(s):  Explore community resource options for unmet needs related to:  Financial Strain   Interventions: Discussed common feeling and emotions when being diagnosed with cancer, and the importance of support during treatment Informed patient of the support team roles and support services at [facility] Provided [place] contact information and encouraged patient to call with any questions or concerns Referred patient to Arboriculturist  and provided pt w/ a [employer] card to pick up medication from the pharmacy today.   Follow Up Plan: Patient will contact CSW with any support or resource needs Patient verbalizes understanding of plan: Yes    Cameron Combs, LCSW

## 2022-02-22 NOTE — Telephone Encounter (Signed)
CALLED PATIENT TO ASK ABOUT COMING IN FOR FU WITH DR. KINARD ON 03-28-22 @ 3 PM, SPOKE WITH PATIENT AND HE AGREED TO THIS DATE AND TIME

## 2022-02-22 NOTE — Patient Instructions (Signed)

## 2022-02-22 NOTE — Patient Instructions (Addendum)
A prescription for liquid narcotic pain medicine Hycet  is being sent to [facility] because they are the only one I was able to find it in stock at.  I have sent a referral to the nutrition team to contact you and discuss the best way to help you start better tolerating liquids and food.    I also sent a referral to your gastro doctor at [place]. They should be contacting you to set up an appointment.  If your pain improved with the medicine Hycet you can try the viscous lidocaine as well to see if that helps the pain even more. You should also continue to use the Carafate. Your pain is severe right now, however these things should all work together to help.   When you can tolerate drinking again please try protein shakes so you are getting more nutrients than just water.  We are sending a scheduling message to have you come in for weekly IV fluids for the next 4 weeks. The appointments will show up on your MyChart.

## 2022-02-23 ENCOUNTER — Telehealth: Payer: Self-pay | Admitting: Internal Medicine

## 2022-02-23 ENCOUNTER — Ambulatory Visit: Payer: Medicare Other

## 2022-02-23 NOTE — Telephone Encounter (Signed)
Per 12/12 IB, called to set up urgent nut appt, pt said they do not feel like driving back here this week so I scheduled with infusion on 12/19 and pt confirmed

## 2022-02-24 ENCOUNTER — Encounter: Payer: Self-pay | Admitting: Internal Medicine

## 2022-02-24 NOTE — Progress Notes (Signed)
Patient called today after receiving my card to inquire about [employer].  Introduced myself as Arboriculturist and discussed the one-time [employer] and qualifications to assist with personal expenses while going through treatment. Advised what is needed to apply.  He will bring on 12/19 at next appointment to provide to registration to be scanned and emailed to me. He will then be given paperwork to complete and a copy of it as well as expense sheet. Advised to contact me at earliest convenience after appointment to discuss expenses in detail. Briefly went over expenses and how they are covered.  He has my card for any additional financial questions or concerns.

## 2022-03-01 ENCOUNTER — Encounter: Payer: Self-pay | Admitting: Internal Medicine

## 2022-03-01 ENCOUNTER — Inpatient Hospital Stay: Payer: Medicare Other | Admitting: Dietician

## 2022-03-01 ENCOUNTER — Other Ambulatory Visit: Payer: Self-pay

## 2022-03-01 ENCOUNTER — Inpatient Hospital Stay (HOSPITAL_BASED_OUTPATIENT_CLINIC_OR_DEPARTMENT_OTHER): Payer: Medicare Other

## 2022-03-01 VITALS — BP 146/86 | HR 66 | Temp 98.2°F | Resp 18

## 2022-03-01 DIAGNOSIS — C3412 Malignant neoplasm of upper lobe, left bronchus or lung: Secondary | ICD-10-CM

## 2022-03-01 DIAGNOSIS — Z5111 Encounter for antineoplastic chemotherapy: Secondary | ICD-10-CM | POA: Diagnosis not present

## 2022-03-01 DIAGNOSIS — E860 Dehydration: Secondary | ICD-10-CM

## 2022-03-01 DIAGNOSIS — Z87891 Personal history of nicotine dependence: Secondary | ICD-10-CM | POA: Diagnosis not present

## 2022-03-01 DIAGNOSIS — Z79899 Other long term (current) drug therapy: Secondary | ICD-10-CM | POA: Diagnosis not present

## 2022-03-01 MED ORDER — SODIUM CHLORIDE 0.9 % IV SOLN: Freq: Once | INTRAVENOUS | Status: AC

## 2022-03-01 MED ORDER — FAMOTIDINE IN NACL 20-0.9 MG/50ML-% IV SOLN
20.0000 mg | Freq: Once | INTRAVENOUS | Status: AC
  Administered 2022-03-01: 20 mg via INTRAVENOUS
  Filled 2022-03-01: qty 50

## 2022-03-01 MED ORDER — SODIUM CHLORIDE 0.9 % IV SOLN: Freq: Once | INTRAVENOUS | Status: DC

## 2022-03-01 MED ORDER — ONDANSETRON HCL 4 MG/2ML IJ SOLN
8.0000 mg | Freq: Once | INTRAMUSCULAR | Status: AC
  Administered 2022-03-01: 8 mg via INTRAVENOUS
  Filled 2022-03-01: qty 4

## 2022-03-01 NOTE — Progress Notes (Signed)
Nutrition Assessment   Reason for Assessment: Referral (poor po)   ASSESSMENT: 71 year old male with SCC of left lung, stage IIIA. Patient receiving concurrent chemoradiation with weekly carboplatin, paclitaxel. He is under the care of Dr. Earlie Server. Patient completed 28 of 30 planned fractions of radiation therapy under the care of Dr. Sondra Come. Patient stopped treatment due to severe radiation esophagitis.   Past medical history includes HTN, hiatal hernia, esophageal stricture, GERD, diverticulosis, hepatic cirrhosis, BPH, B12 deficiency, CVA, tinnitus, lipoma of abdominal wall, HLD  Met with patient during infusion. He is receiving IV fluids today. Patient attempting to eat a sandwich this morning at visit. It is apparent he is having pain with swallowing. Patient reports most all foods burn going down. He recalls some foods he is unable to tolerate at all including butter, milk, ensure, chocolate protein drinks. Patient reports his daughter has him stocked up with a variety of foods/drinks. He reports eating eggs, chicken pot pie, yogurt, potato soup without significant pain. Patient is not taking Hycet, says this does not work after the first 2 bites. Patient reports all food/drink has to be barely warm/room temp.   Nutrition Focused Physical Exam:   Orbital Region: moderate Buccal Region: moderate Upper Arm Region: Therapist, art and Lumbar Region: uta Temple Region: moderate Clavicle Bone Region: severe Shoulder and Acromion Bone Region: uta Scapular Bone Region: uta Dorsal Hand: mild Patellar Region: uta Anterior Thigh Region: uta Posterior Calf Region: uta Edema (RD assessment): uta Hair: wearing hat Eyes: reviewed  Mouth: poor dentition  Skin: reviewed - dry Nails: reviewed   Medications: eliquis, gabapentin, xylocaine, hycet, methylprednisolone, fish oil, prilosec, protonix, compazine, carafate   Labs: 12/12 - glucose 154   Anthropometrics: Weights have decreased 4.3%  (6%) from 139 lb on 12/5 - this is severe for time frame  Height: 5'10" Weight: 133 lb 11.2 oz (12/12) UBW: 140-145 lb (per pt) BMI: 19.18   NUTRITION DIAGNOSIS: Unintentional weight loss related to radiation induced esophagitis as evidenced by painful swallow and 4% wt loss in 6 days which is severe for time frame   INTERVENTION:  Educated on strategies for altering texture of foods for ease of intake - handouts provided Patient appreciative of room temperature water as well as yogurt provided during visit  Encouraged soft moist high protein foods in small amounts frequently throughout the day - handout with ideas provided Contact information given    MONITORING, EVALUATION, GOAL: Patient will tolerate increased calories and protein to minimize further weight loss   Next Visit: Tuesday January 9 during infusion

## 2022-03-01 NOTE — Progress Notes (Signed)
Patient presented documentation at registration for one-time $1000 [employer]. Patient approved for grant to assist with personal expenses while going through treatment. He has a copy of the approval letter and expense sheet along with Outpatient pharmacy information. He requested and was given a gas card per registrar. He was advised to contact me at his earliest convenience to discuss other grant expenses in detail.  He has my card to do so and for any other financial questions or concerns.

## 2022-03-08 ENCOUNTER — Inpatient Hospital Stay: Payer: Medicare Other

## 2022-03-08 ENCOUNTER — Other Ambulatory Visit: Payer: Self-pay

## 2022-03-08 VITALS — BP 109/67 | HR 78 | Temp 97.9°F | Resp 18 | Wt 138.2 lb

## 2022-03-08 DIAGNOSIS — E860 Dehydration: Secondary | ICD-10-CM | POA: Diagnosis not present

## 2022-03-08 DIAGNOSIS — C3412 Malignant neoplasm of upper lobe, left bronchus or lung: Secondary | ICD-10-CM

## 2022-03-08 DIAGNOSIS — Z87891 Personal history of nicotine dependence: Secondary | ICD-10-CM | POA: Diagnosis not present

## 2022-03-08 DIAGNOSIS — Z5111 Encounter for antineoplastic chemotherapy: Secondary | ICD-10-CM | POA: Diagnosis not present

## 2022-03-08 DIAGNOSIS — Z79899 Other long term (current) drug therapy: Secondary | ICD-10-CM | POA: Diagnosis not present

## 2022-03-08 MED ORDER — SODIUM CHLORIDE 0.9 % IV SOLN: Freq: Once | INTRAVENOUS | Status: AC

## 2022-03-08 MED ORDER — ONDANSETRON HCL 4 MG/2ML IJ SOLN
8.0000 mg | Freq: Once | INTRAMUSCULAR | Status: AC
  Administered 2022-03-08: 8 mg via INTRAVENOUS
  Filled 2022-03-08: qty 4

## 2022-03-08 MED ORDER — FAMOTIDINE IN NACL 20-0.9 MG/50ML-% IV SOLN
20.0000 mg | Freq: Once | INTRAVENOUS | Status: AC
  Administered 2022-03-08: 20 mg via INTRAVENOUS
  Filled 2022-03-08: qty 50

## 2022-03-08 MED ORDER — SODIUM CHLORIDE 0.9 % IV SOLN: Freq: Once | INTRAVENOUS | Status: DC

## 2022-03-08 NOTE — Patient Instructions (Signed)
Dehydration, Adult Dehydration is a condition in which there is not enough water or other fluids in the body. This happens when a person loses more fluids than he or she takes in. Important organs, such as the kidneys, brain, and heart, cannot function without a proper amount of fluids. Any loss of fluids from the body can lead to dehydration. Dehydration can be mild, moderate, or severe. It should be treated right away to prevent it from becoming severe. What are the causes? Dehydration may be caused by: Conditions that cause loss of water or other fluids, such as diarrhea, vomiting, or sweating or urinating a lot. Not drinking enough fluids, especially when you are ill or doing activities that require a lot of energy. Other illnesses and conditions, such as fever or infection. Certain medicines, such as medicines that remove excess fluid from the body (diuretics). Lack of safe drinking water. Not being able to get enough water and food. What increases the risk? The following factors may make you more likely to develop this condition: Having a long-term (chronic) illness that has not been treated properly, such as diabetes, heart disease, or kidney disease. Being 65 years of age or older. Having a disability. Living in a place that is high in altitude, where thinner, drier air causes more fluid loss. Doing exercises that put stress on your body for a long time (endurance sports). What are the signs or symptoms? Symptoms of dehydration depend on how severe it is. Mild or moderate dehydration Thirst. Dry lips or dry mouth. Dizziness or light-headedness, especially when standing up from a seated position. Muscle cramps. Dark urine. Urine may be the color of tea. Less urine or tears produced than usual. Headache. Severe dehydration Changes in skin. Your skin may be cold and clammy, blotchy, or pale. Your skin also may not return to normal after being lightly pinched and released. Little or  no tears, urine, or sweat. Changes in vital signs, such as rapid breathing and low blood pressure. Your pulse may be weak or may be faster than 100 beats a minute when you are sitting still. Other changes, such as: Feeling very thirsty. Sunken eyes. Cold hands and feet. Confusion. Being very tired (lethargic) or having trouble waking from sleep. Short-term weight loss. Loss of consciousness. How is this diagnosed? This condition is diagnosed based on your symptoms and a physical exam. You may have blood and urine tests to help confirm the diagnosis. How is this treated? Treatment for this condition depends on how severe it is. Treatment should be started right away. Do not wait until dehydration becomes severe. Severe dehydration is an emergency and needs to be treated in a hospital. Mild or moderate dehydration can be treated at home. You may be asked to: Drink more fluids. Drink an oral rehydration solution (ORS). This drink helps restore proper amounts of fluids and salts and minerals in the blood (electrolytes). Severe dehydration can be treated: With IV fluids. By correcting abnormal levels of electrolytes. This is often done by giving electrolytes through a tube that is passed through your nose and into your stomach (nasogastric tube, or NG tube). By treating the underlying cause of dehydration. Follow these instructions at home: Oral rehydration solution If told by your health care provider, drink an ORS: Make an ORS by following instructions on the package. Start by drinking small amounts, about  cup (120 mL) every 5-10 minutes. Slowly increase how much you drink until you have taken the amount recommended by your health   care provider. Eating and drinking        Drink enough clear fluid to keep your urine pale yellow. If you were told to drink an ORS, finish the ORS first and then start slowly drinking other clear fluids. Drink fluids such as: Water. Do not drink only  water. Doing that can lead to hyponatremia, which is having too little salt (sodium) in the body. Water from ice chips you suck on. Fruit juice that you have added water to (diluted fruit juice). Low-calorie sports drinks. Eat foods that contain a healthy balance of electrolytes, such as bananas, oranges, potatoes, tomatoes, and spinach. Do not drink alcohol. Avoid the following: Drinks that contain a lot of sugar. These include high-calorie sports drinks, fruit juice that is not diluted, and soda. Caffeine. Foods that are greasy or contain a lot of fat or sugar. General instructions Take over-the-counter and prescription medicines only as told by your health care provider. Do not take sodium tablets. Doing that can lead to having too much sodium in the body (hypernatremia). Return to your normal activities as told by your health care provider. Ask your health care provider what activities are safe for you. Keep all follow-up visits as told by your health care provider. This is important. Contact a health care provider if: You have muscle cramps, pain, or discomfort, such as: Pain in your abdomen and the pain gets worse or stays in one area (localizes). Stiff neck. You have a rash. You are more irritable than usual. You are sleepier or have a harder time waking than usual. You feel weak or dizzy. You feel very thirsty. Get help right away if you have: Any symptoms of severe dehydration. Symptoms of vomiting, such as: You cannot eat or drink without vomiting. Vomiting gets worse or does not go away. Vomit includes blood or green matter (bile). Symptoms that get worse with treatment. A fever. A severe headache. Problems with urination or bowel movements, such as: Diarrhea that gets worse or does not go away. Blood in your stool (feces). This may cause stool to look black and tarry. Not urinating, or urinating only a small amount of very dark urine, within 6-8 hours. Trouble  breathing. These symptoms may represent a serious problem that is an emergency. Do not wait to see if the symptoms will go away. Get medical help right away. Call your local emergency services (911 in the [place] Do not drive yourself to the hospital. Summary Dehydration is a condition in which there is not enough water or other fluids in the body. This happens when a person loses more fluids than he or she takes in. Treatment for this condition depends on how severe it is. Treatment should be started right away. Do not wait until dehydration becomes severe. Drink enough clear fluid to keep your urine pale yellow. If you were told to drink an oral rehydration solution (ORS), finish the ORS first and then start slowly drinking other clear fluids. Take over-the-counter and prescription medicines only as told by your health care provider. Get help right away if you have any symptoms of severe dehydration. This information is not intended to replace advice given to you by your health care provider. Make sure you discuss any questions you have with your health care provider. Document Revised: 07/07/2021 Document Reviewed: 10/11/2018 Elsevier Patient Education  2023 Elsevier Inc.  

## 2022-03-15 ENCOUNTER — Telehealth: Payer: Self-pay | Admitting: Medical Oncology

## 2022-03-15 ENCOUNTER — Ambulatory Visit: Payer: Medicare Other

## 2022-03-15 NOTE — Telephone Encounter (Signed)
Cancelled appt because he feels better and stated he does not need IVF.

## 2022-03-16 ENCOUNTER — Inpatient Hospital Stay: Payer: Medicare Other | Attending: Internal Medicine

## 2022-03-16 DIAGNOSIS — C3432 Malignant neoplasm of lower lobe, left bronchus or lung: Secondary | ICD-10-CM | POA: Insufficient documentation

## 2022-03-16 DIAGNOSIS — Z5112 Encounter for antineoplastic immunotherapy: Secondary | ICD-10-CM | POA: Insufficient documentation

## 2022-03-16 DIAGNOSIS — C771 Secondary and unspecified malignant neoplasm of intrathoracic lymph nodes: Secondary | ICD-10-CM | POA: Insufficient documentation

## 2022-03-16 DIAGNOSIS — Z79899 Other long term (current) drug therapy: Secondary | ICD-10-CM | POA: Insufficient documentation

## 2022-03-17 ENCOUNTER — Ambulatory Visit (HOSPITAL_COMMUNITY)
Admission: RE | Admit: 2022-03-17 | Discharge: 2022-03-17 | Disposition: A | Discharge status: Home / Self Care | Payer: Medicare Other | Source: Ambulatory Visit | Attending: Physician Assistant | Admitting: Physician Assistant

## 2022-03-17 DIAGNOSIS — C3490 Malignant neoplasm of unspecified part of unspecified bronchus or lung: Secondary | ICD-10-CM | POA: Diagnosis not present

## 2022-03-17 DIAGNOSIS — J439 Emphysema, unspecified: Secondary | ICD-10-CM | POA: Diagnosis not present

## 2022-03-17 DIAGNOSIS — C3412 Malignant neoplasm of upper lobe, left bronchus or lung: Secondary | ICD-10-CM | POA: Diagnosis not present

## 2022-03-17 MED ORDER — IOHEXOL 300 MG/ML  SOLN
100.0000 mL | Freq: Once | INTRAMUSCULAR | Status: AC | PRN
  Administered 2022-03-17: 75 mL via INTRAVENOUS

## 2022-03-17 MED ORDER — SODIUM CHLORIDE (PF) 0.9 % IJ SOLN
INTRAMUSCULAR | Status: AC
  Filled 2022-03-17: qty 50

## 2022-03-21 NOTE — Progress Notes (Signed)
Cameron Roman is here today for follow up post radiation to the lung.  Lung Side: left   Does the patient complain of any of the following: Pain:Pain in his back and neck. Shortness of breath w/wo exertion: no Cough: Reports a dry cough. Hemoptysis: no Pain with swallowing: yes ,some pain. Swallowing/choking concerns:  Some  difficulty with swallowing Appetite: good Energy Level: good Post radiation skin Changes: No issues    Additional comments if applicable: Vitals:   03/28/22 1434  BP: 107/65  Pulse: 79  Resp: 20  Temp: 97.7 F (36.5 C)  SpO2: 99%  Weight: 64 kg  Height: 5\' 10"  (1.778 m)

## 2022-03-22 ENCOUNTER — Inpatient Hospital Stay (HOSPITAL_BASED_OUTPATIENT_CLINIC_OR_DEPARTMENT_OTHER): Payer: Medicare Other | Admitting: Internal Medicine

## 2022-03-22 ENCOUNTER — Inpatient Hospital Stay: Payer: Medicare Other

## 2022-03-22 ENCOUNTER — Inpatient Hospital Stay: Payer: Medicare Other | Admitting: Dietician

## 2022-03-22 VITALS — Ht 70.0 in | Wt 138.0 lb

## 2022-03-22 DIAGNOSIS — C3412 Malignant neoplasm of upper lobe, left bronchus or lung: Secondary | ICD-10-CM | POA: Diagnosis not present

## 2022-03-22 NOTE — Progress Notes (Signed)
[facility] [phone]   [phone]  PROGRESS NOTE FOR TELEMEDICINE VISITS  Dettinger, [name], MD [place] [address]  I connected withNAME@ on 03/22/22 at 11:15 AM EST by telephone visit and verified that I am speaking with the correct person using two identifiers.   I discussed the limitations, risks, security and privacy concerns of performing an evaluation and management service by telemedicine and the availability of in-person appointments. I also discussed with the patient that there may be a patient responsible charge related to this service. The patient expressed understanding and agreed to proceed.  Other persons participating in the visit and their role in the encounter: None  Patient's location: Home Provider's location: [place]  DIAGNOSIS:  Stage IIIB (T3, N2, M0) non-small cell lung cancer, squamous cell carcinoma presented with large left lower lobe lung mass in addition subcarinal and AP window nodal metastases. Diagnosed in September 2023.      PRIOR THERAPY: Concurrent chemo/radiation with carboplatin for an AUC of 2 and paclitaxel 45 mg/m2 weekly. First dose expected 01/10/22. Status post 7 cycles.  Last dose was giving February 15, 2022 with partial response.   CURRENT THERAPY: Consolidation treatment with immunotherapy with Imfinzi 1500 Mg IV every 4 weeks.  First dose March 31, 2022.   INTERVAL HISTORY: Cameron Roman 72 y.o. male has a telephone virtual visit with me today for evaluation and discussion of his scan results and treatment options.  The patient is feeling fine today with no concerning complaints except for the mild fatigue and residual dysphagia and odynophagia.  He is getting his weight back.  He denied having any current chest pain, shortness of breath except with exertion with no cough or hemoptysis.  He has no nausea, vomiting, diarrhea or constipation.  He has no headache or visual  changes.  He had repeat CT scan of the chest performed recently and we are having the visit for evaluation of his scan results and treatment options.  MEDICAL HISTORY: Past Medical History:  Diagnosis Date   Anxiety    Arthritis    Atrial fibrillation (HCC)    Chronic back pain    buldging disc   Chronic neck pain    ruptured disc   Cough    hx smoking   Diverticulosis    Esophageal stricture    GERD (gastroesophageal reflux disease)    takes Prilosec daily   H/O hiatal hernia    Hearing loss    Hepatitis-C 2017   treated and cured per patient   Hiatal hernia    History of colon polyps    Insomnia    takes Elavil nightly   Lipoma of abdominal wall 02/11/2011   Lung cancer ([place])    Stroke (HCC)    x 2   Type 2 diabetes mellitus ([facility]) 07/22/2020   patient denies DM, per MD note pt is pre-diabetic    ALLERGIES:  is allergic to percocet [oxycodone-acetaminophen].  MEDICATIONS:  Current Outpatient Medications  Medication Sig Dispense Refill   apixaban (ELIQUIS) 5 MG TABS tablet TAKE ONE TABLET BY MOUTH TWICE DAILY 60 tablet 5   fluticasone (FLONASE) 50 MCG/ACT nasal spray Place 1 spray into the nose in the morning.     gabapentin (NEURONTIN) 300 MG capsule Take 1 capsule (300 mg total) by mouth 3 (three) times daily. Take one capsule twice to three times daily (Patient taking differently: Take 300 mg by mouth 2 (two) times daily.) 270  capsule 3   HYDROcodone-acetaminophen (HYCET) 7.5-325 mg/15 ml solution Take 10 mLs by mouth 4 (four) times daily as needed for moderate pain. 120 mL 0   lidocaine (XYLOCAINE) 2 % solution Use as directed 15 mLs in the mouth or throat 3 (three) times daily with meals. Swallow 1/2 hour prior to meals 100 mL 2   Omega-3 Fatty Acids (FISH OIL) 1000 MG CAPS Take 1,000 mg by mouth in the morning and at bedtime.      omeprazole (PRILOSEC) 20 MG capsule Take 1 capsule (20 mg total) by mouth daily. 90 capsule 3   pantoprazole (PROTONIX) 40 MG tablet  Take 1 tablet (40 mg total) by mouth daily. 90 tablet 3   pravastatin (PRAVACHOL) 20 MG tablet Take 1 tablet (20 mg total) by mouth daily. Take one tablet once daily (Patient taking differently: Take 20 mg by mouth at bedtime. Take one tablet once daily) 90 tablet 3   prochlorperazine (COMPAZINE) 10 MG tablet Take 1 tablet (10 mg total) by mouth every 6 (six) hours as needed for nausea or vomiting. 30 tablet 1   sucralfate (CARAFATE) 1 g tablet Take 1 tablet (1 g total) by mouth 4 (four) times daily -  with meals and at bedtime. Crush and dissolve in 10 mL of warm water prior to swallowing 120 tablet 1   tamsulosin (FLOMAX) 0.4 MG CAPS capsule Take 1 capsule (0.4 mg total) by mouth daily. 90 capsule 3   Current Facility-Administered Medications  Medication Dose Route Frequency Provider Last Rate Last Admin   methylPREDNISolone acetate (DEPO-MEDROL) injection 80 mg  80 mg Intramuscular Once Dettinger, [name], MD        SURGICAL HISTORY:  Past Surgical History:  Procedure Laterality Date   ANTERIOR CERVICAL DECOMP/DISCECTOMY FUSION  04/12/2011   Procedure: ANTERIOR CERVICAL DECOMPRESSION/DISCECTOMY FUSION 2 LEVELS;  Surgeon: Peggyann Shoals, MD;  Location: [facility];  Service: Neurosurgery;  Laterality: N/A;  exploration of Cervical four - seven  fusion with redo Cervical six- seven, cervical three- four anterior cervical decompression with fusion interbody prothesis plating and bonegraft and C34 anterior cervical decompression with inte   APPENDECTOMY     at age 55   BRONCHIAL BIOPSY  11/30/2021   Procedure: BRONCHIAL BIOPSIES;  Surgeon: Garner Nash, DO;  Location: [facility];  Service: Pulmonary;;   BRONCHIAL BRUSHINGS  11/30/2021   Procedure: BRONCHIAL BRUSHINGS;  Surgeon: Garner Nash, DO;  Location: [facility];  Service: Pulmonary;;   CARDIAC CATHETERIZATION  06/03/2004   COLONOSCOPY     EP IMPLANTABLE DEVICE N/A 09/14/2015   Procedure: Loop Recorder Insertion;  Surgeon:  Evans Lance, MD;  Location: [facility];  Service: Cardiovascular;  Laterality: N/A;   FINE NEEDLE ASPIRATION  11/30/2021   Procedure: FINE NEEDLE ASPIRATION (FNA) LINEAR;  Surgeon: Garner Nash, DO;  Location: [facility];  Service: Pulmonary;;   HAND SURGERY Right 03/15/2003   right x 3   INNER EAR SURGERY Bilateral    bil;titanium in both ears   lower back surgery  03/15/2007   had plates and screws inserted   NECK SURGERY  03/15/2007   had insertion of  plates and screws   TEE WITHOUT CARDIOVERSION N/A 09/14/2015   Procedure: TRANSESOPHAGEAL ECHOCARDIOGRAM (TEE);  Surgeon: Thayer Headings, MD;  Location: [facility];  Service: Cardiovascular;  Laterality: N/A;   TRANSFORAMINAL LUMBAR INTERBODY FUSION (TLIF) WITH PEDICLE SCREW FIXATION 1 LEVEL Left 09/27/2019   Procedure: Left Lumbar 5 Sacral  1 Transforaminal lumbar interbody fusion with exploration/removal of adjacent level hardware;  Surgeon: Erline Levine, MD;  Location: [facility];  Service: Neurosurgery;  Laterality: Left;  3C/RM 19   VIDEO BRONCHOSCOPY WITH ENDOBRONCHIAL ULTRASOUND N/A 11/30/2021   Procedure: VIDEO BRONCHOSCOPY WITH ENDOBRONCHIAL ULTRASOUND;  Surgeon: Garner Nash, DO;  Location: [facility];  Service: Pulmonary;  Laterality: N/A;    REVIEW OF SYSTEMS:  Constitutional: positive for fatigue Eyes: negative Ears, nose, mouth, throat, and face: negative Respiratory: negative Cardiovascular: negative Gastrointestinal: positive for dysphagia Genitourinary:negative Integument/breast: negative Hematologic/lymphatic: negative Musculoskeletal:negative Neurological: negative Behavioral/Psych: negative Endocrine: negative Allergic/Immunologic: negative    LABORATORY DATA: Lab Results  Component Value Date   WBC 3.4 (L) 02/22/2022   HGB 11.7 (L) 02/22/2022   HCT 33.5 (L) 02/22/2022   MCV 93.8 02/22/2022   PLT 247 02/22/2022      Chemistry      Component Value Date/Time   NA 135 02/22/2022  0951   NA 140 08/30/2021 1659   K 4.1 02/22/2022 0951   CL 102 02/22/2022 0951   CO2 26 02/22/2022 0951   BUN 20 02/22/2022 0951   BUN 17 08/30/2021 1659   CREATININE 0.83 02/22/2022 0951   CREATININE 0.80 08/30/2012 0955      Component Value Date/Time   CALCIUM 9.9 02/22/2022 0951   ALKPHOS 65 02/22/2022 0951   AST 11 (L) 02/22/2022 0951   ALT 6 02/22/2022 0951   BILITOT 0.5 02/22/2022 0951       RADIOGRAPHIC STUDIES: CT Chest W Contrast  Result Date: 03/19/2022 CLINICAL DATA:  Non-small-cell lung cancer. Restaging. * Tracking Code: BO * EXAM: CT CHEST WITH CONTRAST TECHNIQUE: Multidetector CT imaging of the chest was performed during intravenous contrast administration. RADIATION DOSE REDUCTION: This exam was performed according to the departmental dose-optimization program which includes automated exposure control, adjustment of the mA and/or kV according to patient size and/or use of iterative reconstruction technique. CONTRAST:  56mL OMNIPAQUE IOHEXOL 300 MG/ML  SOLN COMPARISON:  PET-CT 12/30/2021 FINDINGS: Cardiovascular: The heart size is normal. No substantial pericardial effusion. Coronary artery calcification is evident. Mild atherosclerotic calcification is noted in the wall of the thoracic aorta. Mediastinum/Nodes: No mediastinal lymphadenopathy. There is no hilar lymphadenopathy. The esophagus has normal imaging features. There is no axillary lymphadenopathy. Lungs/Pleura: Centrilobular and paraseptal emphysema evident. Biapical pleuroparenchymal scarring is similar to prior. Marked interval decrease in size of the parahilar left lower lobe mass lesion, now incorporated into a plaque-like area of scarring in the retro hilar left lung. No residual measurable mass on the current study. Subpleural reticulation is again noted in the lungs bilaterally with diffuse bronchial wall thickening. No new suspicious pulmonary nodule or mass on the current exam. No focal airspace consolidation.  No pleural effusion. Upper Abdomen: Unremarkable. Musculoskeletal: No worrisome lytic or sclerotic osseous abnormality. IMPRESSION: 1. Marked interval decrease in size of the parahilar left lower lobe mass lesion, now incorporated into a plaque-like area of post treatment scarring in the retro hilar left lung. No residual measurable mass on the current study. 2. No evidence for metastatic disease in the chest. 3. Aortic Atherosclerosis (ICD10-I70.0) and Emphysema (ICD10-J43.9). Electronically Signed   By: Misty Stanley M.D.   On: 03/19/2022 07:57    ASSESSMENT AND PLAN:  This is a very pleasant 72 years old white male diagnosed with a stage IIIb (T3, N2, M0) non-small cell lung cancer, squamous cell carcinoma presented with large left lower lobe lung mass in addition to subcarinal and AP window  lymphadenopathy diagnosed in September 2023.  The patient underwent a course of concurrent chemoradiation with weekly carboplatin and paclitaxel status post 7 cycles of the chemotherapy.  He tolerated this treatment well with no concerning adverse effects except for the radiation-induced esophagitis and odynophagia.  He is recovering well from his treatment. The patient had CT scan of the chest performed recently.  I personally and independently reviewed the scans and discussed the results with the patient today. His scan showed marked decrease in the size of the parahilar left lower lobe mass and residual measurable mass on the current study and no evidence of metastatic disease in the chest. I discussed with the patient the next option for his treatment and recommended for him treatment with consolidation immunotherapy with Imfinzi 1500 Mg IV every 4 weeks for a total of 1 year as long as the patient has no evidence for disease progression or unacceptable toxicity. I discussed with the patient the adverse effect of this treatment including but not limited to immunotherapy mediated pneumonitis, skin rash, diarrhea,  inflammation of the kidney, liver, thyroid or other endocrine dysfunction including type 1 diabetes mellitus. The patient is interested in the treatment and he is expected to start the first cycle of this treatment on March 31, 2022. I will see him back for follow-up visit at that time. He was advised to call immediately if he has any other concerning symptoms in the interval. I discussed the assessment and treatment plan with the patient. The patient was provided an opportunity to ask questions and all were answered. The patient agreed with the plan and demonstrated an understanding of the instructions.   The patient was advised to call back or seek an in-person evaluation if the symptoms worsen or if the condition fails to improve as anticipated.  I provided 25 minutes of non face-to-face telephone visit time during this encounter, and > 50% was spent counseling as documented under my assessment & plan.  Eilleen Kempf, MD 03/22/2022 11:53 AM  Disclaimer: This note was dictated with voice recognition software. Similar sounding words can inadvertently be transcribed and may not be corrected upon review.

## 2022-03-22 NOTE — Progress Notes (Signed)
DISCONTINUE ON PATHWAY REGIMEN - Non-Small Cell Lung     A cycle is every 7 days, concurrent with RT:     Paclitaxel      Carboplatin   **Always confirm dose/schedule in your pharmacy ordering system**  REASON: Continuation Of Treatment PRIOR TREATMENT: ERX540: Carboplatin AUC=2 + Paclitaxel 45 mg/m2 Weekly During Radiation TREATMENT RESPONSE: Partial Response (PR)  START ON PATHWAY REGIMEN - Non-Small Cell Lung     A cycle is every 28 days:     Durvalumab   **Always confirm dose/schedule in your pharmacy ordering system**  Patient Characteristics: Preoperative or Nonsurgical Candidate (Clinical Staging), Stage III - Nonsurgical Candidate (Nonsquamous and Squamous), PS = 0, 1 Therapeutic Status: Preoperative or Nonsurgical Candidate (Clinical Staging) AJCC T Category: cT3 AJCC N Category: cN1 AJCC M Category: cM0 AJCC 8 Stage Grouping: IIIA ECOG Performance Status: 1 Intent of Therapy: Curative Intent, Discussed with Patient

## 2022-03-24 ENCOUNTER — Other Ambulatory Visit: Payer: Self-pay

## 2022-03-24 ENCOUNTER — Encounter: Payer: Self-pay | Admitting: Radiation Oncology

## 2022-03-24 NOTE — Progress Notes (Signed)
Pharmacist Chemotherapy Monitoring - Initial Assessment    Anticipated start date: 03/31/22   The following has been reviewed per standard work regarding the patient's treatment regimen: The patient's diagnosis, treatment plan and drug doses, and organ/hematologic function Lab orders and baseline tests specific to treatment regimen  The treatment plan start date, drug sequencing, and pre-medications Prior authorization status  Patient's documented medication list, including drug-drug interaction screen and prescriptions for anti-emetics and supportive care specific to the treatment regimen The drug concentrations, fluid compatibility, administration routes, and timing of the medications to be used The patient's access for treatment and lifetime cumulative dose history, if applicable  The patient's medication allergies and previous infusion related reactions, if applicable   Changes made to treatment plan:  N/A  Follow up needed:  N/A   Larene Beach, RPH, 03/24/2022  2:34 PM

## 2022-03-26 ENCOUNTER — Other Ambulatory Visit: Payer: Self-pay

## 2022-03-27 NOTE — Progress Notes (Signed)
Radiation Oncology         [phone] ________________________________  Name: Cameron Roman MRN: [id number]  Date: 03/28/2022  DOB: 06/04/1950  Follow-Up Visit Note  CC: Dettinger, Elige Radon, MD  Si Gaul, MD  No diagnosis found.  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   Stage IIIA (T3, N2, M0) non-small cell lung cancer, squamous cell carcinoma presented with large left lower lobe lung mass in addition to left hilar and suspicious subcarinal lymphadenopathy  Interval Since Last Radiation: 1 month and 1 week  Indication for treatment: Curative      Radiation treatment dates: 01/10/22 through 02/18/22 Site/dose: 56 Gy delivered in 28 Fx at 2.00 Gy/Fx (Originally planned for 60 Gy delivered in 30 Fx at 2.00 Gy/Fx, however, the patient opted to forgo his last 2 treatment sessions secondary to feeling so poorly)   Beams/energy:  6X, 10X  Narrative:  The patient returns today for routine follow-up.  The patient tolerated radiation treatment with some difficulty. During his final weekly treatment check on 02/15/22, the patient reported throat pain, chest pain, fatigue, dry cough, dysphagia (managed with lidocaine and carafate) and a 5.6 lbs weight loss since his prior weekly treatment check on 02/01/22.  As noted above, the patient opted to forgo his last 2 treatment sessions due to feeling poorly.     In the interval since his initial consultation, the patient has also completed if 7th and final cycle of concurrent chemotherapy consisting of carboplatin and paclitaxel on 02/15/22 under Dr. Arbutus Ped. During his most recent follow-up visit with Dr. Arbutus Ped on 03/22/21, the patient reported feeling relatively well other than mild fatigue along with some residual dysphagia and odynophagia.  His most recent chest CT with contrast on 03/17/22 demonstrated a marked interval decrease in size of the parahilar LLL  mass/lesion. The mass now appears to be incorporated into a plaque like area of post-treatment scarring in the retro hilar left lung. Overall, CT shows no residual mass, or evidence of metastatic disease in the chest.   ***                               Allergies:  is allergic to percocet [oxycodone-acetaminophen].  Meds: Current Outpatient Medications  Medication Sig Dispense Refill   apixaban (ELIQUIS) 5 MG TABS tablet TAKE ONE TABLET BY MOUTH TWICE DAILY 60 tablet 5   fluticasone (FLONASE) 50 MCG/ACT nasal spray Place 1 spray into the nose in the morning.     gabapentin (NEURONTIN) 300 MG capsule Take 1 capsule (300 mg total) by mouth 3 (three) times daily. Take one capsule twice to three times daily (Patient taking differently: Take 300 mg by mouth 2 (two) times daily.) 270 capsule 3   HYDROcodone-acetaminophen (HYCET) 7.5-325 mg/15 ml solution Take 10 mLs by mouth 4 (four) times daily as needed for moderate pain. 120 mL 0   lidocaine (XYLOCAINE) 2 % solution Use as directed 15 mLs in the mouth or throat 3 (three) times daily with meals. Swallow 1/2 hour prior to meals 100 mL 2   Omega-3 Fatty Acids (FISH OIL) 1000 MG CAPS Take 1,000 mg by mouth in the morning and at bedtime.      omeprazole (PRILOSEC) 20 MG capsule Take 1 capsule (20 mg total) by mouth daily. 90 capsule 3   pantoprazole (PROTONIX) 40 MG tablet Take 1 tablet (40 mg total) by mouth  daily. 90 tablet 3   pravastatin (PRAVACHOL) 20 MG tablet Take 1 tablet (20 mg total) by mouth daily. Take one tablet once daily (Patient taking differently: Take 20 mg by mouth at bedtime. Take one tablet once daily) 90 tablet 3   prochlorperazine (COMPAZINE) 10 MG tablet Take 1 tablet (10 mg total) by mouth every 6 (six) hours as needed for nausea or vomiting. 30 tablet 1   sucralfate (CARAFATE) 1 g tablet Take 1 tablet (1 g total) by mouth 4 (four) times daily -  with meals and at bedtime. Crush and dissolve in 10 mL of warm water prior to  swallowing 120 tablet 1   tamsulosin (FLOMAX) 0.4 MG CAPS capsule Take 1 capsule (0.4 mg total) by mouth daily. 90 capsule 3   Current Facility-Administered Medications  Medication Dose Route Frequency Provider Last Rate Last Admin   methylPREDNISolone acetate (DEPO-MEDROL) injection 80 mg  80 mg Intramuscular Once Dettinger, Elige Radon, MD        Physical Findings: The patient is in no acute distress. Patient is alert and oriented.  vitals were not taken for this visit. .  No significant changes. Lungs are clear to auscultation bilaterally. Heart has regular rate and rhythm. No palpable cervical, supraclavicular, or axillary adenopathy. Abdomen soft, non-tender, normal bowel sounds.   Lab Findings: Lab Results  Component Value Date   WBC 3.4 (L) 02/22/2022   HGB 11.7 (L) 02/22/2022   HCT 33.5 (L) 02/22/2022   MCV 93.8 02/22/2022   PLT 247 02/22/2022    Radiographic Findings: CT Chest W Contrast  Result Date: 03/19/2022 CLINICAL DATA:  Non-small-cell lung cancer. Restaging. * Tracking Code: BO * EXAM: CT CHEST WITH CONTRAST TECHNIQUE: Multidetector CT imaging of the chest was performed during intravenous contrast administration. RADIATION DOSE REDUCTION: This exam was performed according to the departmental dose-optimization program which includes automated exposure control, adjustment of the mA and/or kV according to patient size and/or use of iterative reconstruction technique. CONTRAST:  61mL OMNIPAQUE IOHEXOL 300 MG/ML  SOLN COMPARISON:  PET-CT 12/30/2021 FINDINGS: Cardiovascular: The heart size is normal. No substantial pericardial effusion. Coronary artery calcification is evident. Mild atherosclerotic calcification is noted in the wall of the thoracic aorta. Mediastinum/Nodes: No mediastinal lymphadenopathy. There is no hilar lymphadenopathy. The esophagus has normal imaging features. There is no axillary lymphadenopathy. Lungs/Pleura: Centrilobular and paraseptal emphysema evident.  Biapical pleuroparenchymal scarring is similar to prior. Marked interval decrease in size of the parahilar left lower lobe mass lesion, now incorporated into a plaque-like area of scarring in the retro hilar left lung. No residual measurable mass on the current study. Subpleural reticulation is again noted in the lungs bilaterally with diffuse bronchial wall thickening. No new suspicious pulmonary nodule or mass on the current exam. No focal airspace consolidation. No pleural effusion. Upper Abdomen: Unremarkable. Musculoskeletal: No worrisome lytic or sclerotic osseous abnormality. IMPRESSION: 1. Marked interval decrease in size of the parahilar left lower lobe mass lesion, now incorporated into a plaque-like area of post treatment scarring in the retro hilar left lung. No residual measurable mass on the current study. 2. No evidence for metastatic disease in the chest. 3. Aortic Atherosclerosis (ICD10-I70.0) and Emphysema (ICD10-J43.9). Electronically Signed   By: Kennith Center M.D.   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   Stage IIIA (T3, N2, M0) non-small cell lung cancer, squamous cell carcinoma presented with large left lower lobe  lung mass in addition to left hilar and suspicious subcarinal lymphadenopathy  The patient is recovering from the effects of radiation.  ***  Plan:  ***   *** minutes of total time was spent for this patient encounter, including preparation, face-to-face counseling with the patient and coordination of care, physical exam, and documentation of the encounter. ____________________________________  Billie Lade, PhD, MD  This document serves as a record of services personally performed by Antony Blackbird, MD. It was created on his behalf by Neena Rhymes, a trained medical scribe. The creation of this record is based on the scribe's personal observations and the provider's  statements to them. This document has been checked and approved by the attending provider.

## 2022-03-27 NOTE — Progress Notes (Incomplete)
  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   Stage IIIA (T3, N2, M0) non-small cell lung cancer, squamous cell carcinoma presented with large left lower lobe lung mass in addition to left hilar and suspicious subcarinal lymphadenopathy      Indication for treatment: Curative       Radiation treatment dates: 01/10/22 through 02/18/22  Site/dose: 56 Gy delivered in 28 Fx at 2.00 Gy/Fx  Originally planned for 60 Gy delivered in 30 Fx at 2.00 Gy/Fx, however, the patient opted to forgo his last 2 treatment sessions secondary to feeling so poorly    Beams/energy:  6X, 10X  Narrative: The patient tolerated radiation treatment relatively well. During his final weekly treatment check on 02/15/22, the patient reported throat pain, chest pain, fatigue, dry cough, dysphagia (managed with lidocaine and carafate) and a 5.6 lbs weight loss since his prior weekly treatment check on 02/01/22.  As noted above, the patient opted to forgo his last 2 treatment sessions due to feeling poorly.   Plan: The patient has completed radiation treatment. The patient will return to radiation oncology clinic for routine follow-up in one month. I advised them to call or return sooner if they have any questions or concerns related to their recovery or treatment.  -----------------------------------  Billie Lade, PhD, MD  This document serves as a record of services personally performed by Antony Blackbird, MD. It was created on his behalf by Neena Rhymes, a trained medical scribe. The creation of this record is based on the scribe's personal observations and the provider's statements to them. This document has been checked and approved by the attending provider.

## 2022-03-28 ENCOUNTER — Telehealth: Payer: Self-pay | Admitting: Internal Medicine

## 2022-03-28 ENCOUNTER — Encounter: Payer: Self-pay | Admitting: Radiation Oncology

## 2022-03-28 ENCOUNTER — Ambulatory Visit
Admission: RE | Admit: 2022-03-28 | Discharge: 2022-03-28 | Disposition: A | Discharge status: Home / Self Care | Payer: Medicare Other | Source: Ambulatory Visit | Attending: Radiation Oncology | Admitting: Radiation Oncology

## 2022-03-28 VITALS — BP 107/65 | HR 79 | Temp 97.7°F | Resp 20 | Ht 70.0 in | Wt 141.2 lb

## 2022-03-28 DIAGNOSIS — Z923 Personal history of irradiation: Secondary | ICD-10-CM | POA: Diagnosis not present

## 2022-03-28 DIAGNOSIS — C778 Secondary and unspecified malignant neoplasm of lymph nodes of multiple regions: Secondary | ICD-10-CM | POA: Insufficient documentation

## 2022-03-28 DIAGNOSIS — K209 Esophagitis, unspecified without bleeding: Secondary | ICD-10-CM | POA: Diagnosis not present

## 2022-03-28 DIAGNOSIS — Z7901 Long term (current) use of anticoagulants: Secondary | ICD-10-CM | POA: Diagnosis not present

## 2022-03-28 DIAGNOSIS — Z79899 Other long term (current) drug therapy: Secondary | ICD-10-CM | POA: Diagnosis not present

## 2022-03-28 DIAGNOSIS — C3412 Malignant neoplasm of upper lobe, left bronchus or lung: Secondary | ICD-10-CM | POA: Diagnosis not present

## 2022-03-28 HISTORY — DX: Personal history of irradiation: Z92.3

## 2022-03-28 NOTE — Telephone Encounter (Signed)
Called patient regarding upcoming January-February appointments, left a voicemail.

## 2022-03-29 NOTE — Progress Notes (Signed)
[facility] OFFICE PROGRESS NOTE  Dettinger, Elige Radon, MD [address] [place] [address]  DIAGNOSIS: Stage IIIB (T3, N2, M0) non-small cell lung cancer, squamous cell carcinoma presented with large left lower lobe lung mass in addition subcarinal and AP window nodal metastases. Diagnosed in September 2023.     PRIOR THERAPY: Concurrent chemo/radiation with carboplatin for an AUC of 2 and paclitaxel 45 mg/m2 weekly. First dose expected 01/10/22. Status post 7 cycles.  Last dose was giving February 15, 2022 with partial response.   CURRENT THERAPY: Consolidation treatment with immunotherapy with Imfinzi 1500 Mg IV every 4 weeks. First dose March 31, 2022.   INTERVAL HISTORY: Cameron Roman 72 y.o. male returns to the clinic today for a follow-up visit. The patient completed a course of concurrent chemoradiation last month.  The patient had telephone visit with Dr. Arbutus Ped on 03/22/2022 and had a positive response to treatment.  Therefore Dr. Arbutus Ped recommended that the patient start consolidation immunotherapy Imfinzi.  The patient seemed frustrated today. He was under the impression that he wouldn't need treatment for for several months. He is not clear about the next steps in his care, which we discussed today.    The patient is overall feeling fine today.  The patient is recovering from his odynophagia and dysphagia from his radiation which is improved compared to prior but he is still taking his Carafate.  He had a 1 month follow-up with Dr. Roselind Messier earlier this week.  The patient denies any fever, chills, or night sweats. He is gaining some of the weight that he lost. He denies any chest pain, shortness breath, or hemoptysis. He has a dry cough but does not take anything for this.  Denies any nausea, vomiting, diarrhea, or constipation.  Denies any headache or visual changes.  Denies any rashes but has itching in his lower extremities bilaterally.  He is here today for evaluation  repeat blood work before undergoing cycle #1.   MEDICAL HISTORY: Past Medical History:  Diagnosis Date   Anxiety    Arthritis    Atrial fibrillation (HCC)    Chronic back pain    buldging disc   Chronic neck pain    ruptured disc   Cough    hx smoking   Diverticulosis    Esophageal stricture    GERD (gastroesophageal reflux disease)    takes Prilosec daily   H/O hiatal hernia    Hearing loss    Hepatitis-C 2017   treated and cured per patient   Hiatal hernia    History of colon polyps    History of radiation therapy    Left Lung- 01/10/22-02/18/22- Dr. Antony Blackbird   Insomnia    takes Elavil nightly   Lipoma of abdominal wall 02/11/2011   Lung cancer (HCC)    Stroke (HCC)    x 2   Type 2 diabetes mellitus (HCC) 07/22/2020   patient denies DM, per MD note pt is pre-diabetic    ALLERGIES:  is allergic to percocet [oxycodone-acetaminophen].  MEDICATIONS:  Current Outpatient Medications  Medication Sig Dispense Refill   apixaban (ELIQUIS) 5 MG TABS tablet TAKE ONE TABLET BY MOUTH TWICE DAILY 60 tablet 5   fluticasone (FLONASE) 50 MCG/ACT nasal spray Place 1 spray into the nose in the morning.     gabapentin (NEURONTIN) 300 MG capsule Take 1 capsule (300 mg total) by mouth 3 (three) times daily. Take one capsule twice to three times daily (Patient taking differently: Take 300  mg by mouth 2 (two) times daily.) 270 capsule 3   HYDROcodone-acetaminophen (HYCET) 7.5-325 mg/15 ml solution Take 10 mLs by mouth 4 (four) times daily as needed for moderate pain. 120 mL 0   Omega-3 Fatty Acids (FISH OIL) 1000 MG CAPS Take 1,000 mg by mouth in the morning and at bedtime.     omeprazole (PRILOSEC) 20 MG capsule Take 1 capsule (20 mg total) by mouth daily. 90 capsule 3   pantoprazole (PROTONIX) 40 MG tablet Take 1 tablet (40 mg total) by mouth daily. 90 tablet 3   pravastatin (PRAVACHOL) 20 MG tablet Take 1 tablet (20 mg total) by mouth daily. Take one tablet once daily (Patient taking  differently: Take 20 mg by mouth at bedtime. Take one tablet once daily) 90 tablet 3   prochlorperazine (COMPAZINE) 10 MG tablet Take 1 tablet (10 mg total) by mouth every 6 (six) hours as needed for nausea or vomiting. 30 tablet 1   sucralfate (CARAFATE) 1 g tablet Take 1 tablet (1 g total) by mouth 4 (four) times daily -  with meals and at bedtime. Crush and dissolve in 10 mL of warm water prior to swallowing 120 tablet 1   tamsulosin (FLOMAX) 0.4 MG CAPS capsule Take 1 capsule (0.4 mg total) by mouth daily. 90 capsule 3   Current Facility-Administered Medications  Medication Dose Route Frequency Provider Last Rate Last Admin   methylPREDNISolone acetate (DEPO-MEDROL) injection 80 mg  80 mg Intramuscular Once Dettinger, Elige Radon, MD       Facility-Administered Medications Ordered in Other Visits  Medication Dose Route Frequency Provider Last Rate Last Admin   durvalumab (IMFINZI) 1,500 mg in sodium chloride 0.9 % 100 mL chemo infusion  1,500 mg Intravenous Once Si Gaul, MD        SURGICAL HISTORY:  Past Surgical History:  Procedure Laterality Date   ANTERIOR CERVICAL DECOMP/DISCECTOMY FUSION  04/12/2011   Procedure: ANTERIOR CERVICAL DECOMPRESSION/DISCECTOMY FUSION 2 LEVELS;  Surgeon: Dorian Heckle, MD;  Location: [facility];  Service: Neurosurgery;  Laterality: N/A;  exploration of Cervical four - seven  fusion with redo Cervical six- seven, cervical three- four anterior cervical decompression with fusion interbody prothesis plating and bonegraft and C34 anterior cervical decompression with inte   APPENDECTOMY     at age 72   BRONCHIAL BIOPSY  11/30/2021   Procedure: BRONCHIAL BIOPSIES;  Surgeon: Josephine Igo, DO;  Location: [facility];  Service: Pulmonary;;   BRONCHIAL BRUSHINGS  11/30/2021   Procedure: BRONCHIAL BRUSHINGS;  Surgeon: Josephine Igo, DO;  Location: [facility];  Service: Pulmonary;;   CARDIAC CATHETERIZATION  06/03/2004   COLONOSCOPY     EP  IMPLANTABLE DEVICE N/A 09/14/2015   Procedure: Loop Recorder Insertion;  Surgeon: Marinus Maw, MD;  Location: [facility]AS[facility] CV [facility];  Service: Cardiovascular;  Laterality: N/A;   FINE NEEDLE ASPIRATION  11/30/2021   Procedure: FINE NEEDLE ASPIRATION (FNA) LINEAR;  Surgeon: Josephine Igo, DO;  Location: [facility];  Service: Pulmonary;;   HAND SURGERY Right 03/15/2003   right x 3   INNER EAR SURGERY Bilateral    bil;titanium in both ears   lower back surgery  03/15/2007   had plates and screws inserted   NECK SURGERY  03/15/2007   had insertion of  plates and screws   TEE WITHOUT CARDIOVERSION N/A 09/14/2015   Procedure: TRANSESOPHAGEAL ECHOCARDIOGRAM (TEE);  Surgeon: Vesta Mixer, MD;  Location: [facility];  Service: Cardiovascular;  Laterality: N/A;  TRANSFORAMINAL LUMBAR INTERBODY FUSION (TLIF) WITH PEDICLE SCREW FIXATION 1 LEVEL Left 09/27/2019   Procedure: Left Lumbar 5 Sacral 1 Transforaminal lumbar interbody fusion with exploration/removal of adjacent level hardware;  Surgeon: Maeola Harman, MD;  Location: [facility];  Service: Neurosurgery;  Laterality: Left;  3C/RM 19   VIDEO BRONCHOSCOPY WITH ENDOBRONCHIAL ULTRASOUND N/A 11/30/2021   Procedure: VIDEO BRONCHOSCOPY WITH ENDOBRONCHIAL ULTRASOUND;  Surgeon: Josephine Igo, DO;  Location: [facility];  Service: Pulmonary;  Laterality: N/A;    REVIEW OF SYSTEMS:   Review of Systems  Constitutional: Positive for fatigue. Negative for appetite change, chills, fatigue, fever and unexpected weight change.  HENT:   Negative for mouth sores, nosebleeds, sore throat and trouble swallowing.   Eyes: Negative for eye problems and icterus.  Respiratory: Positive for stable dyspnea on exertion. Positive for stable dry cough. Negative for hemoptysis and wheezing.   Cardiovascular: Negative for chest pain and leg swelling.  Gastrointestinal: Negative for abdominal pain, constipation, diarrhea, nausea and vomiting.  Genitourinary: Negative  for bladder incontinence, difficulty urinating, dysuria, frequency and hematuria.   Musculoskeletal: Negative for back pain, gait problem, neck pain and neck stiffness.  Skin: Positive for itching. Negative for rash.  Neurological: Negative for dizziness, extremity weakness, gait problem, headaches, light-headedness and seizures.  Hematological: Negative for adenopathy. Does not bruise/bleed easily.  Psychiatric/Behavioral: Negative for confusion, depression and sleep disturbance. The patient is not nervous/anxious.     PHYSICAL EXAMINATION:  Blood pressure 110/70, pulse 71, temperature 98.4 F (36.9 C), temperature source Oral, resp. rate 14, weight 143 lb 11.2 oz (65.2 kg), SpO2 98 %.  ECOG PERFORMANCE STATUS: 1  Physical Exam  Constitutional: Oriented to person, place, and time and thin appearing male and in no distress. No distress.  HENT:  Head: Normocephalic and atraumatic.  Mouth/Throat: Oropharynx is clear and moist. No oropharyngeal exudate.  Eyes: Conjunctivae are normal. Right eye exhibits no discharge. Left eye exhibits no discharge. No scleral icterus.  Neck: Normal range of motion. Neck supple.  Cardiovascular: Normal rate, regular rhythm, normal heart sounds and intact distal pulses.   Pulmonary/Chest: Effort normal. Positive for decreased breath sounds bilaterally. No respiratory distress. No wheezes. No rales.  Abdominal: Exhibits no distension Musculoskeletal: Normal range of motion. Exhibits no edema.  Lymphadenopathy:    No cervical adenopathy.  Neurological: Alert and oriented to person, place, and time. Exhibits normal muscle tone. Gait normal. Coordination normal.  Skin: Skin is warm and dry. No rash noted. Not diaphoretic. No erythema. No pallor.  Psychiatric: Mood, memory and judgment normal.  Vitals reviewed.  LABORATORY DATA: Lab Results  Component Value Date   WBC 7.8 03/31/2022   HGB 11.8 (L) 03/31/2022   HCT 33.9 (L) 03/31/2022   MCV 96.6 03/31/2022    PLT 325 03/31/2022      Chemistry      Component Value Date/Time   NA 139 03/31/2022 0855   NA 140 08/30/2021 1659   K 4.0 03/31/2022 0855   CL 105 03/31/2022 0855   CO2 28 03/31/2022 0855   BUN 12 03/31/2022 0855   BUN 17 08/30/2021 1659   CREATININE 0.85 03/31/2022 0855   CREATININE 0.80 08/30/2012 0955      Component Value Date/Time   CALCIUM 9.6 03/31/2022 0855   ALKPHOS 68 03/31/2022 0855   AST 13 (L) 03/31/2022 0855   ALT 7 03/31/2022 0855   BILITOT 0.3 03/31/2022 0855       RADIOGRAPHIC STUDIES:  CT Chest W Contrast  Result Date:  03/19/2022 CLINICAL DATA:  Non-small-cell lung cancer. Restaging. * Tracking Code: BO * EXAM: CT CHEST WITH CONTRAST TECHNIQUE: Multidetector CT imaging of the chest was performed during intravenous contrast administration. RADIATION DOSE REDUCTION: This exam was performed according to the departmental dose-optimization program which includes automated exposure control, adjustment of the mA and/or kV according to patient size and/or use of iterative reconstruction technique. CONTRAST:  67mL OMNIPAQUE IOHEXOL 300 MG/ML  SOLN COMPARISON:  PET-CT 12/30/2021 FINDINGS: Cardiovascular: The heart size is normal. No substantial pericardial effusion. Coronary artery calcification is evident. Mild atherosclerotic calcification is noted in the wall of the thoracic aorta. Mediastinum/Nodes: No mediastinal lymphadenopathy. There is no hilar lymphadenopathy. The esophagus has normal imaging features. There is no axillary lymphadenopathy. Lungs/Pleura: Centrilobular and paraseptal emphysema evident. Biapical pleuroparenchymal scarring is similar to prior. Marked interval decrease in size of the parahilar left lower lobe mass lesion, now incorporated into a plaque-like area of scarring in the retro hilar left lung. No residual measurable mass on the current study. Subpleural reticulation is again noted in the lungs bilaterally with diffuse bronchial wall thickening.  No new suspicious pulmonary nodule or mass on the current exam. No focal airspace consolidation. No pleural effusion. Upper Abdomen: Unremarkable. Musculoskeletal: No worrisome lytic or sclerotic osseous abnormality. IMPRESSION: 1. Marked interval decrease in size of the parahilar left lower lobe mass lesion, now incorporated into a plaque-like area of post treatment scarring in the retro hilar left lung. No residual measurable mass on the current study. 2. No evidence for metastatic disease in the chest. 3. Aortic Atherosclerosis (ICD10-I70.0) and Emphysema (ICD10-J43.9). Electronically Signed   By: Kennith Center M.D.   On: 03/19/2022 07:57     ASSESSMENT/PLAN:  This is a very pleasant 72 years old Caucasian male recently diagnosed with at least stage IIIB (T3, N2, M0) non-small cell lung cancer, squamous cell carcinoma presented with large left lower lobe lung mass in addition to subcarinal and AP window lymphadenopathy diagnosed in September 2023.    He had a brain MRI that was negative for metastatic disease of the brain.    He completed a course of concurrent chemoradiation with carboplatin for an AUC of 2 and paclitaxel 45 mg per metered squared.  He is status post 7 cycles of treatment.    The patient is scheduled to start consolidation immunotherapy with Imfinzi 1500 mg IV every 4 weeks.  The patient seems confused/frustrated about the plan moving forward.  I reviewed the indications for immunotherapy with the patient and the benefits.  Of course, discussed that the patient does not have to take treatment but that would be our recommendation.  I offered to cancel the patient's infusion today to give him more time to decide.  After we reviewed the dosing, side effects, and frequency of patient appointments, the patient decided to proceed with treatment today as scheduled.  I did give him a handout of immunotherapy/Durvalumab on his AVS today.  Labs were reviewed.  Recommend he proceed with  cycle #1 today scheduled.  We will see him back for follow-up visit in 4 weeks for evaluation repeat blood work before starting cycle #2.  He will continue using Carafate for his odynophagia and dysphagia.  For his dry cough discussed that he continues Robitussin or Delsym if needed.  The patient also mentions itching.  I encouraged the patient to use lotion on his legs.  I also encouraged him to use a antihistamine.  I offered to send him steroid cream for localized areas  with persistent itching which he declined.  The patient was advised to call immediately if she has any concerning symptoms in the interval. The patient voices understanding of current disease status and treatment options and is in agreement with the current care plan. All questions were answered. The patient knows to call the clinic with any problems, questions or concerns. We can certainly see the patient much sooner if necessary         No orders of the defined types were placed in this encounter.    he total time spent in the appointment was 20-29 minutes.   Bradie Sangiovanni L Nan Maya, PA-C 03/31/22

## 2022-03-30 ENCOUNTER — Other Ambulatory Visit: Payer: Self-pay

## 2022-03-31 ENCOUNTER — Inpatient Hospital Stay (HOSPITAL_BASED_OUTPATIENT_CLINIC_OR_DEPARTMENT_OTHER): Payer: Medicare Other | Admitting: Physician Assistant

## 2022-03-31 ENCOUNTER — Other Ambulatory Visit: Payer: Self-pay

## 2022-03-31 ENCOUNTER — Inpatient Hospital Stay: Payer: Medicare Other

## 2022-03-31 ENCOUNTER — Inpatient Hospital Stay (HOSPITAL_BASED_OUTPATIENT_CLINIC_OR_DEPARTMENT_OTHER): Payer: Medicare Other

## 2022-03-31 VITALS — BP 127/81 | HR 64 | Resp 16

## 2022-03-31 VITALS — BP 110/70 | HR 71 | Temp 98.4°F | Resp 14 | Wt 143.7 lb

## 2022-03-31 DIAGNOSIS — Z79899 Other long term (current) drug therapy: Secondary | ICD-10-CM | POA: Diagnosis not present

## 2022-03-31 DIAGNOSIS — C3412 Malignant neoplasm of upper lobe, left bronchus or lung: Secondary | ICD-10-CM

## 2022-03-31 DIAGNOSIS — Z5112 Encounter for antineoplastic immunotherapy: Secondary | ICD-10-CM | POA: Diagnosis not present

## 2022-03-31 DIAGNOSIS — C771 Secondary and unspecified malignant neoplasm of intrathoracic lymph nodes: Secondary | ICD-10-CM | POA: Diagnosis not present

## 2022-03-31 DIAGNOSIS — C3432 Malignant neoplasm of lower lobe, left bronchus or lung: Secondary | ICD-10-CM | POA: Diagnosis not present

## 2022-03-31 LAB — CBC WITH DIFFERENTIAL (CANCER CENTER ONLY)
Abs Immature Granulocytes: 0.02 10*3/uL (ref 0.00–0.07)
Basophils Absolute: 0.1 10*3/uL (ref 0.0–0.1)
Basophils Relative: 1 %
Eosinophils Absolute: 0.6 10*3/uL — ABNORMAL HIGH (ref 0.0–0.5)
Eosinophils Relative: 8 %
HCT: 33.9 % — ABNORMAL LOW (ref 39.0–52.0)
Hemoglobin: 11.8 g/dL — ABNORMAL LOW (ref 13.0–17.0)
Immature Granulocytes: 0 %
Lymphocytes Relative: 25 %
Lymphs Abs: 2 10*3/uL (ref 0.7–4.0)
MCH: 33.6 pg (ref 26.0–34.0)
MCHC: 34.8 g/dL (ref 30.0–36.0)
MCV: 96.6 fL (ref 80.0–100.0)
Monocytes Absolute: 1 10*3/uL (ref 0.1–1.0)
Monocytes Relative: 12 %
Neutro Abs: 4.2 10*3/uL (ref 1.7–7.7)
Neutrophils Relative %: 54 %
Platelet Count: 325 10*3/uL (ref 150–400)
RBC: 3.51 MIL/uL — ABNORMAL LOW (ref 4.22–5.81)
RDW: 18.6 % — ABNORMAL HIGH (ref 11.5–15.5)
WBC Count: 7.8 10*3/uL (ref 4.0–10.5)
nRBC: 0 % (ref 0.0–0.2)

## 2022-03-31 LAB — TSH: TSH: 2.25 u[IU]/mL (ref 0.350–4.500)

## 2022-03-31 LAB — CMP (CANCER CENTER ONLY)
ALT: 7 U/L (ref 0–44)
AST: 13 U/L — ABNORMAL LOW (ref 15–41)
Albumin: 3.8 g/dL (ref 3.5–5.0)
Alkaline Phosphatase: 68 U/L (ref 38–126)
Anion gap: 6 (ref 5–15)
BUN: 12 mg/dL (ref 8–23)
CO2: 28 mmol/L (ref 22–32)
Calcium: 9.6 mg/dL (ref 8.9–10.3)
Chloride: 105 mmol/L (ref 98–111)
Creatinine: 0.85 mg/dL (ref 0.61–1.24)
GFR, Estimated: >60 mL/min (ref >60)
Glucose, Bld: 75 mg/dL (ref 70–99)
Potassium: 4 mmol/L (ref 3.5–5.1)
Sodium: 139 mmol/L (ref 135–145)
Total Bilirubin: 0.3 mg/dL (ref 0.3–1.2)
Total Protein: 7.3 g/dL (ref 6.5–8.1)

## 2022-03-31 MED ORDER — SODIUM CHLORIDE 0.9 % IV SOLN: Freq: Once | INTRAVENOUS | Status: AC

## 2022-03-31 MED ORDER — SODIUM CHLORIDE 0.9 % IV SOLN
1500.0000 mg | Freq: Once | INTRAVENOUS | Status: AC
  Administered 2022-03-31: 1500 mg via INTRAVENOUS
  Filled 2022-03-31: qty 30

## 2022-03-31 NOTE — Patient Instructions (Signed)
[facility] MEDICAL ONCOLOGY  Discharge Instructions: Thank you for choosing [facility] to provide your oncology and hematology care.   If you have a lab appointment with the Cancer Center, please go directly to the Cancer Center and check in at the registration area.   Wear comfortable clothing and clothing appropriate for easy access to any Portacath or PICC line.   We strive to give you quality time with your provider. You may need to reschedule your appointment if you arrive late (15 or more minutes).  Arriving late affects you and other patients whose appointments are after yours.  Also, if you miss three or more appointments without notifying the office, you may be dismissed from the clinic at the provider's discretion.      For prescription refill requests, have your pharmacy contact our office and allow 72 hours for refills to be completed.    Today you received the following chemotherapy and/or immunotherapy agents Imfinzi      To help prevent nausea and vomiting after your treatment, we encourage you to take your nausea medication as directed.  BELOW ARE SYMPTOMS THAT SHOULD BE REPORTED IMMEDIATELY: *FEVER GREATER THAN 100.4 F (38 C) OR HIGHER *CHILLS OR SWEATING *NAUSEA AND VOMITING THAT IS NOT CONTROLLED WITH YOUR NAUSEA MEDICATION *UNUSUAL SHORTNESS OF BREATH *UNUSUAL BRUISING OR BLEEDING *URINARY PROBLEMS (pain or burning when urinating, or frequent urination) *BOWEL PROBLEMS (unusual diarrhea, constipation, pain near the anus) TENDERNESS IN MOUTH AND THROAT WITH OR WITHOUT PRESENCE OF ULCERS (sore throat, sores in mouth, or a toothache) UNUSUAL RASH, SWELLING OR PAIN  UNUSUAL VAGINAL DISCHARGE OR ITCHING   Items with * indicate a potential emergency and should be followed up as soon as possible or go to the Emergency Department if any problems should occur.  Please show the CHEMOTHERAPY ALERT CARD or IMMUNOTHERAPY ALERT CARD at check-in to the  Emergency Department and triage nurse.  Should you have questions after your visit or need to cancel or reschedule your appointment, please contact [facility] MEDICAL ONCOLOGY  Dept: [phone]  and follow the prompts.  Office hours are 8:00 a.m. to 4:30 p.m. Monday - Friday. Please note that voicemails left after 4:00 p.m. may not be returned until the following business day.  We are closed weekends and major holidays. You have access to a nurse at all times for urgent questions. Please call the main number to the clinic Dept: [phone] and follow the prompts.   For any non-urgent questions, you may also contact your provider using MyChart. We now offer e-Visits for anyone 56 and older to request care online for non-urgent symptoms. For details visit [email or web address].   Also download the MyChart app! Go to the app store, search "MyChart", open the app, select [place], and log in with your MyChart username and password.  Durvalumab Injection What is this medication? DURVALUMAB (dur VAL ue mab) treats some types of cancer. It works by helping your immune system slow or stop the spread of cancer cells. It is a monoclonal antibody. This medicine may be used for other purposes; ask your health care provider or pharmacist if you have questions. COMMON BRAND NAME(S): IMFINZI What should I tell my care team before I take this medication? They need to know if you have any of these conditions: Allogeneic stem cell transplant (uses someone else's stem cells) Autoimmune diseases, such as Crohn disease, ulcerative colitis, lupus History of chest radiation Nervous system problems, such as Guillain-Barre  syndrome, myasthenia gravis Organ transplant An unusual or allergic reaction to durvalumab, other medications, foods, dyes, or preservatives Pregnant or trying to get pregnant Breast-feeding How should I use this medication? This medication is infused into a vein. It is given  by your care team in a hospital or clinic setting. A special MedGuide will be given to you before each treatment. Be sure to read this information carefully each time. Talk to your care team about the use of this medication in children. Special care may be needed. Overdosage: If you think you have taken too much of this medicine contact a poison control center or emergency room at once. NOTE: This medicine is only for you. Do not share this medicine with others. What if I miss a dose? Keep appointments for follow-up doses. It is important not to miss your dose. Call your care team if you are unable to keep an appointment. What may interact with this medication? Interactions have not been studied. This list may not describe all possible interactions. Give your health care provider a list of all the medicines, herbs, non-prescription drugs, or dietary supplements you use. Also tell them if you smoke, drink alcohol, or use illegal drugs. Some items may interact with your medicine. What should I watch for while using this medication? Your condition will be monitored carefully while you are receiving this medication. You may need blood work while taking this medication. This medication may cause serious skin reactions. They can happen weeks to months after starting the medication. Contact your care team right away if you notice fevers or flu-like symptoms with a rash. The rash may be red or purple and then turn into blisters or peeling of the skin. You may also notice a red rash with swelling of the face, lips, or lymph nodes in your neck or under your arms. Tell your care team right away if you have any change in your eyesight. Talk to your care team if you may be pregnant. Serious birth defects can occur if you take this medication during pregnancy and for 3 months after the last dose. You will need a negative pregnancy test before starting this medication. Contraception is recommended while taking this  medication and for 3 months after the last dose. Your care team can help you find the option that works for you. Do not breastfeed while taking this medication and for 3 months after the last dose. What side effects may I notice from receiving this medication? Side effects that you should report to your care team as soon as possible: Allergic reactions--skin rash, itching, hives, swelling of the face, lips, tongue, or throat Dry cough, shortness of breath or trouble breathing Eye pain, redness, irritation, or discharge with blurry or decreased vision Heart muscle inflammation--unusual weakness or fatigue, shortness of breath, chest pain, fast or irregular heartbeat, dizziness, swelling of the ankles, feet, or hands Hormone gland problems--headache, sensitivity to light, unusual weakness or fatigue, dizziness, fast or irregular heartbeat, increased sensitivity to cold or heat, excessive sweating, constipation, hair loss, increased thirst or amount of urine, tremors or shaking, irritability Infusion reactions--chest pain, shortness of breath or trouble breathing, feeling faint or lightheaded Kidney injury (glomerulonephritis)--decrease in the amount of urine, red or dark brown urine, foamy or bubbly urine, swelling of the ankles, hands, or feet Liver injury--right upper belly pain, loss of appetite, nausea, light-colored stool, dark yellow or brown urine, yellowing skin or eyes, unusual weakness or fatigue Pain, tingling, or numbness in the hands or  feet, muscle weakness, change in vision, confusion or trouble speaking, loss of balance or coordination, trouble walking, seizures Rash, fever, and swollen lymph nodes Redness, blistering, peeling, or loosening of the skin, including inside the mouth Sudden or severe stomach pain, bloody diarrhea, fever, nausea, vomiting Side effects that usually do not require medical attention (report these to your care team if they continue or are bothersome): Bone,  joint, or muscle pain Diarrhea Fatigue Loss of appetite Nausea Skin rash This list may not describe all possible side effects. Call your doctor for medical advice about side effects. You may report side effects to FDA at 1-800-FDA-1088. Where should I keep my medication? This medication is given in a hospital or clinic. It will not be stored at home. NOTE: This sheet is a summary. It may not cover all possible information. If you have questions about this medicine, talk to your doctor, pharmacist, or health care provider.  2023 Elsevier/Gold Standard (2021-06-21 00:00:00)

## 2022-03-31 NOTE — Patient Instructions (Signed)
-  We covered a lot of important information at your appointment today regarding what the treatment plan is moving forward. Here are the the main points that were discussed at your office visit with [place] today:  -The treatment will consist of a new medication. This is not chemotherapy. This new drug is a type of Immunotherapy called Imfinzi (Durvalumab).  -We are planning on starting your treatment next week on _/_/_  -Your treatment will be given once every 4 weeks. You will receive this treatment every four weeks for a total of 1 year (13 total treatments) unless you experience unacceptable toxicity or if there is evidence on your routine CT scans that the cancer is growing  -We will get a CT scan after every 3 treatments to check on the progress of treatment  Side Effects:  -The adverse effect of the immunotherapy including but not limited to immunotherapy mediated skin rash, diarrhea, inflammation of the lung, kidney, liver, thyroid or other endocrine dysfunction  Follow up:  -We will see you back for a follow up visit in __.

## 2022-04-01 LAB — T4: T4, Total: 9 ug/dL (ref 4.5–12.0)

## 2022-04-06 ENCOUNTER — Other Ambulatory Visit: Payer: Self-pay

## 2022-04-06 DIAGNOSIS — K760 Fatty (change of) liver, not elsewhere classified: Secondary | ICD-10-CM | POA: Diagnosis not present

## 2022-04-06 DIAGNOSIS — K7469 Other cirrhosis of liver: Secondary | ICD-10-CM | POA: Diagnosis not present

## 2022-04-07 ENCOUNTER — Other Ambulatory Visit: Payer: Self-pay | Admitting: Nurse Practitioner

## 2022-04-07 DIAGNOSIS — K7469 Other cirrhosis of liver: Secondary | ICD-10-CM

## 2022-04-21 ENCOUNTER — Encounter: Payer: Self-pay | Admitting: Physician Assistant

## 2022-04-21 ENCOUNTER — Encounter: Payer: Self-pay | Admitting: Internal Medicine

## 2022-04-26 ENCOUNTER — Other Ambulatory Visit: Payer: Self-pay

## 2022-04-28 ENCOUNTER — Inpatient Hospital Stay: Payer: Medicare Other

## 2022-04-28 ENCOUNTER — Other Ambulatory Visit: Payer: Self-pay

## 2022-04-28 ENCOUNTER — Inpatient Hospital Stay: Payer: Medicare Other | Attending: Internal Medicine

## 2022-04-28 ENCOUNTER — Encounter: Payer: Self-pay | Admitting: Internal Medicine

## 2022-04-28 ENCOUNTER — Inpatient Hospital Stay (HOSPITAL_BASED_OUTPATIENT_CLINIC_OR_DEPARTMENT_OTHER): Payer: Medicare Other | Admitting: Internal Medicine

## 2022-04-28 VITALS — BP 119/81 | HR 70 | Temp 98.4°F | Resp 16 | Wt 147.1 lb

## 2022-04-28 DIAGNOSIS — C771 Secondary and unspecified malignant neoplasm of intrathoracic lymph nodes: Secondary | ICD-10-CM | POA: Diagnosis not present

## 2022-04-28 DIAGNOSIS — C3412 Malignant neoplasm of upper lobe, left bronchus or lung: Secondary | ICD-10-CM

## 2022-04-28 DIAGNOSIS — C3432 Malignant neoplasm of lower lobe, left bronchus or lung: Secondary | ICD-10-CM | POA: Insufficient documentation

## 2022-04-28 DIAGNOSIS — Z5112 Encounter for antineoplastic immunotherapy: Secondary | ICD-10-CM | POA: Diagnosis not present

## 2022-04-28 DIAGNOSIS — Z79899 Other long term (current) drug therapy: Secondary | ICD-10-CM | POA: Diagnosis not present

## 2022-04-28 LAB — CBC WITH DIFFERENTIAL (CANCER CENTER ONLY)
Abs Immature Granulocytes: 0.03 10*3/uL (ref 0.00–0.07)
Basophils Absolute: 0.1 10*3/uL (ref 0.0–0.1)
Basophils Relative: 1 %
Eosinophils Absolute: 0.4 10*3/uL (ref 0.0–0.5)
Eosinophils Relative: 4 %
HCT: 35.5 % — ABNORMAL LOW (ref 39.0–52.0)
Hemoglobin: 12.2 g/dL — ABNORMAL LOW (ref 13.0–17.0)
Immature Granulocytes: 0 %
Lymphocytes Relative: 18 %
Lymphs Abs: 1.6 10*3/uL (ref 0.7–4.0)
MCH: 33.3 pg (ref 26.0–34.0)
MCHC: 34.4 g/dL (ref 30.0–36.0)
MCV: 97 fL (ref 80.0–100.0)
Monocytes Absolute: 1.1 10*3/uL — ABNORMAL HIGH (ref 0.1–1.0)
Monocytes Relative: 12 %
Neutro Abs: 5.8 10*3/uL (ref 1.7–7.7)
Neutrophils Relative %: 65 %
Platelet Count: 281 10*3/uL (ref 150–400)
RBC: 3.66 MIL/uL — ABNORMAL LOW (ref 4.22–5.81)
RDW: 15.8 % — ABNORMAL HIGH (ref 11.5–15.5)
WBC Count: 9 10*3/uL (ref 4.0–10.5)
nRBC: 0 % (ref 0.0–0.2)

## 2022-04-28 LAB — CMP (CANCER CENTER ONLY)
ALT: 8 U/L (ref 0–44)
AST: 15 U/L (ref 15–41)
Albumin: 3.8 g/dL (ref 3.5–5.0)
Alkaline Phosphatase: 57 U/L (ref 38–126)
Anion gap: 9 (ref 5–15)
BUN: 14 mg/dL (ref 8–23)
CO2: 26 mmol/L (ref 22–32)
Calcium: 9.1 mg/dL (ref 8.9–10.3)
Chloride: 103 mmol/L (ref 98–111)
Creatinine: 0.88 mg/dL (ref 0.61–1.24)
GFR, Estimated: >60 mL/min (ref >60)
Glucose, Bld: 95 mg/dL (ref 70–99)
Potassium: 4 mmol/L (ref 3.5–5.1)
Sodium: 138 mmol/L (ref 135–145)
Total Bilirubin: 0.5 mg/dL (ref 0.3–1.2)
Total Protein: 7.8 g/dL (ref 6.5–8.1)

## 2022-04-28 MED ORDER — SODIUM CHLORIDE 0.9 % IV SOLN
1500.0000 mg | Freq: Once | INTRAVENOUS | Status: AC
  Administered 2022-04-28: 1500 mg via INTRAVENOUS
  Filled 2022-04-28: qty 30

## 2022-04-28 MED ORDER — SODIUM CHLORIDE 0.9 % IV SOLN: Freq: Once | INTRAVENOUS | Status: AC

## 2022-04-28 NOTE — Progress Notes (Signed)
Patient seen by MD today  Vitals are within treatment parameters.  Labs reviewed: and are within treatment parameters.pending CMP  Per physician team, patient is ready for treatment and there are NO modifications to the treatment plan.

## 2022-04-28 NOTE — Progress Notes (Signed)
[facility] [phone]) [phone]   Fax:([phone]  OFFICE PROGRESS NOTE  Dettinger, [name], MD [place] [address]  DIAGNOSIS:  Stage IIIB (T3, N2, M0) non-small cell lung cancer, squamous cell carcinoma presented with large left lower lobe lung mass in addition subcarinal and AP window nodal metastases. Diagnosed in September 2023.      PRIOR THERAPY: Concurrent chemo/radiation with carboplatin for an AUC of 2 and paclitaxel 45 mg/m2 weekly. First dose expected 01/10/22. Status post 7 cycles.  Last dose was giving February 15, 2022 with partial response.   CURRENT THERAPY: Consolidation treatment with immunotherapy with Imfinzi 1500 Mg IV every 4 weeks.  First dose March 31, 2022.  Status post 1 cycle.  INTERVAL HISTORY: Cameron Roman 72 y.o. male returns to the clinic today for follow-up visit.  The patient is feeling fine today with no concerning complaints except for losing some hair.  He denied having any current chest pain, shortness of breath, cough or hemoptysis.  He has no nausea, vomiting, diarrhea or constipation.  He has no headache or visual changes.  He denied having any recent weight loss or night sweats.  He is here today for evaluation before starting cycle #2.  MEDICAL HISTORY: Past Medical History:  Diagnosis Date   Anxiety    Arthritis    Atrial fibrillation (HCC)    Chronic back pain    buldging disc   Chronic neck pain    ruptured disc   Cough    hx smoking   Diverticulosis    Esophageal stricture    GERD (gastroesophageal reflux disease)    takes Prilosec daily   H/O hiatal hernia    Hearing loss    Hepatitis-C 2017   treated and cured per patient   Hiatal hernia    History of colon polyps    History of radiation therapy    Left Lung- 01/10/22-02/18/22- Dr. Gery Pray   Insomnia    takes Elavil nightly   Lipoma of abdominal wall 02/11/2011   Lung cancer ([facility])    Stroke ([facility])    x 2   Type 2 diabetes  mellitus ([facility]) 07/22/2020   patient denies DM, per MD note pt is pre-diabetic    ALLERGIES:  is allergic to percocet [oxycodone-acetaminophen].  MEDICATIONS:  Current Outpatient Medications  Medication Sig Dispense Refill   apixaban (ELIQUIS) 5 MG TABS tablet TAKE ONE TABLET BY MOUTH TWICE DAILY 60 tablet 5   durvalumab 1,500 mg in sodium chloride 0.9 % 100 mL Inject 1,500 mg into the vein every 28 (twenty-eight) days.     fluticasone (FLONASE) 50 MCG/ACT nasal spray Place 1 spray into the nose in the morning.     gabapentin (NEURONTIN) 300 MG capsule Take 1 capsule (300 mg total) by mouth 3 (three) times daily. Take one capsule twice to three times daily (Patient taking differently: Take 300 mg by mouth 2 (two) times daily.) 270 capsule 3   HYDROcodone-acetaminophen (HYCET) 7.5-325 mg/15 ml solution Take 10 mLs by mouth 4 (four) times daily as needed for moderate pain. 120 mL 0   Omega-3 Fatty Acids (FISH OIL) 1000 MG CAPS Take 1,000 mg by mouth in the morning and at bedtime.     omeprazole (PRILOSEC) 20 MG capsule Take 1 capsule (20 mg total) by mouth daily. 90 capsule 3   pantoprazole (PROTONIX) 40 MG tablet Take 1 tablet (40 mg total) by mouth daily. 90 tablet 3  pravastatin (PRAVACHOL) 20 MG tablet Take 1 tablet (20 mg total) by mouth daily. Take one tablet once daily (Patient taking differently: Take 20 mg by mouth at bedtime. Take one tablet once daily) 90 tablet 3   prochlorperazine (COMPAZINE) 10 MG tablet Take 1 tablet (10 mg total) by mouth every 6 (six) hours as needed for nausea or vomiting. 30 tablet 1   tamsulosin (FLOMAX) 0.4 MG CAPS capsule Take 1 capsule (0.4 mg total) by mouth daily. 90 capsule 3   Current Facility-Administered Medications  Medication Dose Route Frequency Provider Last Rate Last Admin   methylPREDNISolone acetate (DEPO-MEDROL) injection 80 mg  80 mg Intramuscular Once Dettinger, [name], MD        SURGICAL HISTORY:  Past Surgical History:  Procedure  Laterality Date   ANTERIOR CERVICAL DECOMP/DISCECTOMY FUSION  04/12/2011   Procedure: ANTERIOR CERVICAL DECOMPRESSION/DISCECTOMY FUSION 2 LEVELS;  Surgeon: Peggyann Shoals, MD;  Location: [facility];  Service: Neurosurgery;  Laterality: N/A;  exploration of Cervical four - seven  fusion with redo Cervical six- seven, cervical three- four anterior cervical decompression with fusion interbody prothesis plating and bonegraft and C34 anterior cervical decompression with inte   APPENDECTOMY     at age 50   BRONCHIAL BIOPSY  11/30/2021   Procedure: BRONCHIAL BIOPSIES;  Surgeon: Garner Nash, DO;  Location: [facility];  Service: Pulmonary;;   BRONCHIAL BRUSHINGS  11/30/2021   Procedure: BRONCHIAL BRUSHINGS;  Surgeon: Garner Nash, DO;  Location: [facility];  Service: Pulmonary;;   CARDIAC CATHETERIZATION  06/03/2004   COLONOSCOPY     EP IMPLANTABLE DEVICE N/A 09/14/2015   Procedure: Loop Recorder Insertion;  Surgeon: Evans Lance, MD;  Location: [facility];  Service: Cardiovascular;  Laterality: N/A;   FINE NEEDLE ASPIRATION  11/30/2021   Procedure: FINE NEEDLE ASPIRATION (FNA) LINEAR;  Surgeon: Garner Nash, DO;  Location: [facility];  Service: Pulmonary;;   HAND SURGERY Right 03/15/2003   right x 3   INNER EAR SURGERY Bilateral    bil;titanium in both ears   lower back surgery  03/15/2007   had plates and screws inserted   NECK SURGERY  03/15/2007   had insertion of  plates and screws   TEE WITHOUT CARDIOVERSION N/A 09/14/2015   Procedure: TRANSESOPHAGEAL ECHOCARDIOGRAM (TEE);  Surgeon: Thayer Headings, MD;  Location: [facility];  Service: Cardiovascular;  Laterality: N/A;   TRANSFORAMINAL LUMBAR INTERBODY FUSION (TLIF) WITH PEDICLE SCREW FIXATION 1 LEVEL Left 09/27/2019   Procedure: Left Lumbar 5 Sacral 1 Transforaminal lumbar interbody fusion with exploration/removal of adjacent level hardware;  Surgeon: Erline Levine, MD;  Location: [facility];  Service:  Neurosurgery;  Laterality: Left;  3C/RM 19   VIDEO BRONCHOSCOPY WITH ENDOBRONCHIAL ULTRASOUND N/A 11/30/2021   Procedure: VIDEO BRONCHOSCOPY WITH ENDOBRONCHIAL ULTRASOUND;  Surgeon: Garner Nash, DO;  Location: [facility];  Service: Pulmonary;  Laterality: N/A;    REVIEW OF SYSTEMS:  A comprehensive review of systems was negative.   PHYSICAL EXAMINATION: General appearance: alert, cooperative, and no distress Head: Normocephalic, without obvious abnormality, atraumatic Neck: no adenopathy, no JVD, supple, symmetrical, trachea midline, and thyroid not enlarged, symmetric, no tenderness/mass/nodules Lymph nodes: Cervical, supraclavicular, and axillary nodes normal. Resp: clear to auscultation bilaterally Back: symmetric, no curvature. ROM normal. No CVA tenderness. Cardio: regular rate and rhythm, S1, S2 normal, no murmur, click, rub or gallop GI: soft, non-tender; bowel sounds normal; no masses,  no organomegaly Extremities: extremities normal, atraumatic, no cyanosis or edema  ECOG PERFORMANCE STATUS: 1 - Symptomatic but completely ambulatory  Blood pressure 119/81, pulse 70, temperature 98.4 F (36.9 C), temperature source Oral, resp. rate 16, weight 147 lb 1.6 oz (66.7 kg).  LABORATORY DATA: Lab Results  Component Value Date   WBC 9.0 04/28/2022   HGB 12.2 (L) 04/28/2022   HCT 35.5 (L) 04/28/2022   MCV 97.0 04/28/2022   PLT 281 04/28/2022      Chemistry      Component Value Date/Time   NA 139 03/31/2022 0855   NA 140 08/30/2021 1659   K 4.0 03/31/2022 0855   CL 105 03/31/2022 0855   CO2 28 03/31/2022 0855   BUN 12 03/31/2022 0855   BUN 17 08/30/2021 1659   CREATININE 0.85 03/31/2022 0855   CREATININE 0.80 08/30/2012 0955      Component Value Date/Time   CALCIUM 9.6 03/31/2022 0855   ALKPHOS 68 03/31/2022 0855   AST 13 (L) 03/31/2022 0855   ALT 7 03/31/2022 0855   BILITOT 0.3 03/31/2022 0855       RADIOGRAPHIC STUDIES: No results found.  ASSESSMENT AND  PLAN:  This is a very pleasant 72 years old white male diagnosed with a stage IIIb (T3, N2, M0) non-small cell lung cancer, squamous cell carcinoma presented with large left lower lobe lung mass in addition to subcarinal and AP window lymphadenopathy diagnosed in September 2023.  The patient underwent a course of concurrent chemoradiation with weekly carboplatin and paclitaxel status post 7 cycles of the chemotherapy.  He tolerated this treatment well with no concerning adverse effects except for the radiation-induced esophagitis and odynophagia.  He is recovering well from his treatment. He is currently undergoing a course of consolidation treatment with immunotherapy with Imfinzi 1500 Mg IV every 4 weeks status post 1 cycle.  He tolerated the first cycle of this treatment fairly well with no concerning adverse effects except for mild hair loss. I recommended for the patient to proceed with cycle #2 today as planned. I will see him back for follow-up visit in 4 weeks for evaluation before starting cycle #3. He was advised to call immediately if he has any other concerning symptoms in the interval. The patient voices understanding of current disease status and treatment options and is in agreement with the current care plan.  All questions were answered. The patient knows to call the clinic with any problems, questions or concerns. We can certainly see the patient much sooner if necessary.  The total time spent in the appointment was 20 minutes.  Disclaimer: This note was dictated with voice recognition software. Similar sounding words can inadvertently be transcribed and may not be corrected upon review.

## 2022-04-28 NOTE — Patient Instructions (Signed)
[facility]  Discharge Instructions: Thank you for choosing [facility] to provide your oncology and hematology care.   If you have a lab appointment with the [facility], please go directly to the [facility] and check in at the registration area.   Wear comfortable clothing and clothing appropriate for easy access to any Portacath or PICC line.   We strive to give you quality time with your provider. You may need to reschedule your appointment if you arrive late (15 or more minutes).  Arriving late affects you and other patients whose appointments are after yours.  Also, if you miss three or more appointments without notifying the office, you may be dismissed from the clinic at the provider's discretion.      For prescription refill requests, have your pharmacy contact our office and allow 72 hours for refills to be completed.    Today you received the following chemotherapy and/or immunotherapy agents Durvalumab      To help prevent nausea and vomiting after your treatment, we encourage you to take your nausea medication as directed.  BELOW ARE SYMPTOMS THAT SHOULD BE REPORTED IMMEDIATELY: *FEVER GREATER THAN 100.4 F (38 C) OR HIGHER *CHILLS OR SWEATING *NAUSEA AND VOMITING THAT IS NOT CONTROLLED WITH YOUR NAUSEA MEDICATION *UNUSUAL SHORTNESS OF BREATH *UNUSUAL BRUISING OR BLEEDING *URINARY PROBLEMS (pain or burning when urinating, or frequent urination) *BOWEL PROBLEMS (unusual diarrhea, constipation, pain near the anus) TENDERNESS IN MOUTH AND THROAT WITH OR WITHOUT PRESENCE OF ULCERS (sore throat, sores in mouth, or a toothache) UNUSUAL RASH, SWELLING OR PAIN  UNUSUAL VAGINAL DISCHARGE OR ITCHING   Items with * indicate a potential emergency and should be followed up as soon as possible or go to the Emergency Department if any problems should occur.  Please show the CHEMOTHERAPY ALERT CARD or IMMUNOTHERAPY ALERT CARD at  check-in to the Emergency Department and triage nurse.  Should you have questions after your visit or need to cancel or reschedule your appointment, please contact [facility]  Dept: [phone]  and follow the prompts.  Office hours are 8:00 a.m. to 4:30 p.m. Monday - Friday. Please note that voicemails left after 4:00 p.m. may not be returned until the following business day.  We are closed weekends and major holidays. You have access to a nurse at all times for urgent questions. Please call the main number to the clinic Dept: [phone] and follow the prompts.   For any non-urgent questions, you may also contact your provider using MyChart. We now offer e-Visits for anyone 71 and older to request care online for non-urgent symptoms. For details visit [email or web address].   Also download the MyChart app! Go to the app store, search "MyChart", open the app, select [facility], and log in with your MyChart username and password.

## 2022-04-29 ENCOUNTER — Ambulatory Visit
Admission: RE | Admit: 2022-04-29 | Discharge: 2022-04-29 | Disposition: A | Discharge status: Home / Self Care | Payer: Medicare Other | Source: Ambulatory Visit | Attending: Nurse Practitioner | Admitting: Nurse Practitioner

## 2022-04-29 DIAGNOSIS — K7469 Other cirrhosis of liver: Secondary | ICD-10-CM

## 2022-04-29 DIAGNOSIS — K7460 Unspecified cirrhosis of liver: Secondary | ICD-10-CM | POA: Diagnosis not present

## 2022-04-29 DIAGNOSIS — K8020 Calculus of gallbladder without cholecystitis without obstruction: Secondary | ICD-10-CM | POA: Diagnosis not present

## 2022-05-01 ENCOUNTER — Other Ambulatory Visit: Payer: Self-pay

## 2022-05-18 ENCOUNTER — Other Ambulatory Visit: Payer: Self-pay

## 2022-05-24 NOTE — Progress Notes (Unsigned)
[facility] OFFICE PROGRESS NOTE  Roman, Cameron Kaufmann, MD [place] [address]  DIAGNOSIS: Stage IIIB (T3, N2, M0) non-small cell lung cancer, squamous cell carcinoma presented with large left lower lobe lung mass in addition subcarinal and AP window nodal metastases. Diagnosed in September 2023.      PRIOR THERAPY: Concurrent chemo/radiation with carboplatin for an AUC of 2 and paclitaxel 45 mg/m2 weekly. First dose expected 01/10/22. Status post 7 cycles.  Last dose was giving February 15, 2022 with partial response.    CURRENT THERAPY: Consolidation treatment with immunotherapy with Imfinzi 1500 Mg IV every 4 weeks. First dose March 31, 2022.  Status post 2 cycles.   INTERVAL HISTORY: Cameron Roman 72 y.o. male returns to the clinic for a follow up visit. The patient is feeling well today without any concerning complaints except for fatigue.  Patient follows with a pain clinic in [place], [place] for arthritis.  The patient follows with Dr. Collene Mares.  According to PMP aware, the patient's last fill date of his Norco was on 04/12/2022.  The patient states that he has run out of this since May 13, 2022.  The patient states that he is been unsuccessful in reaching anyone at the pain clinic.  He states he is driven by several times in the parking lot is empty and the doors are locked.  He is wondering if they close down or moved.    He also mentions in the interval he had 1 episode of having blood in his semen.  States it is not bright red blood and is dark/brown. he is unsure how long this has been going on.  Denies any associated symptoms.  He is certain that there is no hematuria.  Denies any abdominal pain, nausea, vomiting, back pain, dysuria, malodorous urine, or any abnormalities.  He has seen a urologist at [facility] urology that he sees once a year for what sounds like his BPH.  Of note, the patient is on a blood thinner.  He denies any other abnormal bleeding or  bruising.  The patient continues to tolerate treatment with immunotherapy well without any adverse effects. Denies any fever, chills, night sweats, or weight loss. Denies any chest pain, shortness of breath, or hemoptysis. He has a baseline dry cough. Denies any headache or visual changes. Denies any rashes or skin changes. The patient is here today for evaluation prior to starting cycle # 3.      MEDICAL HISTORY: Past Medical History:  Diagnosis Date   Anxiety    Arthritis    Atrial fibrillation (HCC)    Chronic back pain    buldging disc   Chronic neck pain    ruptured disc   Cough    hx smoking   Diverticulosis    Esophageal stricture    GERD (gastroesophageal reflux disease)    takes Prilosec daily   H/O hiatal hernia    Hearing loss    Hepatitis-C 2017   treated and cured per patient   Hiatal hernia    History of colon polyps    History of radiation therapy    Left Lung- 01/10/22-02/18/22- Dr. Gery Roman   Insomnia    takes Elavil nightly   Lipoma of abdominal wall 02/11/2011   Lung cancer ([place])    Stroke ([place])    x 2   Type 2 diabetes mellitus ([place]) 07/22/2020   patient denies DM, per MD note pt is pre-diabetic    ALLERGIES:  is allergic to percocet [oxycodone-acetaminophen].  MEDICATIONS:  Current Outpatient Medications  Medication Sig Dispense Refill   apixaban (ELIQUIS) 5 MG TABS tablet TAKE ONE TABLET BY MOUTH TWICE DAILY 60 tablet 5   durvalumab 1,500 mg in sodium chloride 0.9 % 100 mL Inject 1,500 mg into the vein every 28 (twenty-eight) days.     fluticasone (FLONASE) 50 MCG/ACT nasal spray Place 1 spray into the nose in the morning.     gabapentin (NEURONTIN) 300 MG capsule Take 1 capsule (300 mg total) by mouth 3 (three) times daily. Take one capsule twice to three times daily (Patient taking differently: Take 300 mg by mouth 2 (two) times daily.) 270 capsule 3   HYDROcodone-acetaminophen (HYCET) 7.5-325 mg/15 ml solution Take 10 mLs by mouth 4  (four) times daily as needed for moderate pain. 120 mL 0   Omega-3 Fatty Acids (FISH OIL) 1000 MG CAPS Take 1,000 mg by mouth in the morning and at bedtime.     omeprazole (PRILOSEC) 20 MG capsule Take 1 capsule (20 mg total) by mouth daily. 90 capsule 3   pantoprazole (PROTONIX) 40 MG tablet Take 1 tablet (40 mg total) by mouth daily. 90 tablet 3   pravastatin (PRAVACHOL) 20 MG tablet Take 1 tablet (20 mg total) by mouth daily. Take one tablet once daily (Patient taking differently: Take 20 mg by mouth at bedtime. Take one tablet once daily) 90 tablet 3   tamsulosin (FLOMAX) 0.4 MG CAPS capsule Take 1 capsule (0.4 mg total) by mouth daily. 90 capsule 3   prochlorperazine (COMPAZINE) 10 MG tablet Take 1 tablet (10 mg total) by mouth every 6 (six) hours as needed for nausea or vomiting. (Patient not taking: Reported on 05/26/2022) 30 tablet 1   Current Facility-Administered Medications  Medication Dose Route Frequency Provider Last Rate Last Admin   methylPREDNISolone acetate (DEPO-MEDROL) injection 80 mg  80 mg Intramuscular Once Roman, Cameron Kaufmann, MD        SURGICAL HISTORY:  Past Surgical History:  Procedure Laterality Date   ANTERIOR CERVICAL DECOMP/DISCECTOMY FUSION  04/12/2011   Procedure: ANTERIOR CERVICAL DECOMPRESSION/DISCECTOMY FUSION 2 LEVELS;  Surgeon: Peggyann Shoals, MD;  Location: [facility];  Service: Neurosurgery;  Laterality: N/A;  exploration of Cervical four - seven  fusion with redo Cervical six- seven, cervical three- four anterior cervical decompression with fusion interbody prothesis plating and bonegraft and C34 anterior cervical decompression with inte   APPENDECTOMY     at age 30   BRONCHIAL BIOPSY  11/30/2021   Procedure: BRONCHIAL BIOPSIES;  Surgeon: Garner Nash, DO;  Location: [facility];  Service: Pulmonary;;   BRONCHIAL BRUSHINGS  11/30/2021   Procedure: BRONCHIAL BRUSHINGS;  Surgeon: Garner Nash, DO;  Location: [facility];  Service: Pulmonary;;    CARDIAC CATHETERIZATION  06/03/2004   COLONOSCOPY     EP IMPLANTABLE DEVICE N/A 09/14/2015   Procedure: Loop Recorder Insertion;  Surgeon: Evans Lance, MD;  Location: [facility];  Service: Cardiovascular;  Laterality: N/A;   FINE NEEDLE ASPIRATION  11/30/2021   Procedure: FINE NEEDLE ASPIRATION (FNA) LINEAR;  Surgeon: Garner Nash, DO;  Location: [facility];  Service: Pulmonary;;   HAND SURGERY Right 03/15/2003   right x 3   INNER EAR SURGERY Bilateral    bil;titanium in both ears   lower back surgery  03/15/2007   had plates and screws inserted   NECK SURGERY  03/15/2007   had insertion of  plates and screws  TEE WITHOUT CARDIOVERSION N/A 09/14/2015   Procedure: TRANSESOPHAGEAL ECHOCARDIOGRAM (TEE);  Surgeon: Thayer Headings, MD;  Location: [facility];  Service: Cardiovascular;  Laterality: N/A;   TRANSFORAMINAL LUMBAR INTERBODY FUSION (TLIF) WITH PEDICLE SCREW FIXATION 1 LEVEL Left 09/27/2019   Procedure: Left Lumbar 5 Sacral 1 Transforaminal lumbar interbody fusion with exploration/removal of adjacent level hardware;  Surgeon: Erline Levine, MD;  Location: [facility];  Service: Neurosurgery;  Laterality: Left;  3C/RM 19   VIDEO BRONCHOSCOPY WITH ENDOBRONCHIAL ULTRASOUND N/A 11/30/2021   Procedure: VIDEO BRONCHOSCOPY WITH ENDOBRONCHIAL ULTRASOUND;  Surgeon: Garner Nash, DO;  Location: [facility];  Service: Pulmonary;  Laterality: N/A;    REVIEW OF SYSTEMS:   Review of Systems  Constitutional: Positive for fatigue. Negative for appetite change, chills, fatigue, fever and unexpected weight change.  HENT:   Negative for mouth sores, nosebleeds, sore throat and trouble swallowing.   Eyes: Negative for eye problems and icterus.  Respiratory: Positive for stable dyspnea on exertion. Positive for stable dry cough. Negative for hemoptysis and wheezing.   Cardiovascular: Negative for chest pain and leg swelling.  Gastrointestinal: Negative for abdominal pain,  constipation, diarrhea, nausea and vomiting.  Genitourinary: Reported blood in the semen.  Negative for bladder incontinence, difficulty urinating, dysuria, frequency and hematuria.   Musculoskeletal: Positive for chronic pain.  Negative for back pain, gait problem, neck pain and neck stiffness.  Skin: Negative for itching and rash.  Neurological: Negative for dizziness, extremity weakness, gait problem, headaches, light-headedness and seizures.  Hematological: Negative for adenopathy. Does not bruise/bleed easily.  Psychiatric/Behavioral: Negative for confusion, depression and sleep disturbance. The patient is not nervous/anxious.     PHYSICAL EXAMINATION:  Blood pressure 125/72, pulse 68, temperature 98.6 F (37 C), temperature source Oral, resp. rate 16, weight 147 lb 9.6 oz (67 kg), SpO2 96 %.  ECOG PERFORMANCE STATUS: 1  Physical Exam  Constitutional: Oriented to person, place, and time and thin appearing male and in no distress. No distress.  HENT:  Head: Normocephalic and atraumatic.  Mouth/Throat: Oropharynx is clear and moist. No oropharyngeal exudate.  Eyes: Conjunctivae are normal. Right eye exhibits no discharge. Left eye exhibits no discharge. No scleral icterus.  Neck: Normal range of motion. Neck supple.  Cardiovascular: Normal rate, regular rhythm, normal heart sounds and intact distal pulses.   Pulmonary/Chest: Effort normal. Positive for decreased breath sounds bilaterally. No respiratory distress. No wheezes. No rales.  Abdominal: Exhibits no distension Musculoskeletal: Normal range of motion. Exhibits no edema.  Lymphadenopathy:    No cervical adenopathy.  Neurological: Alert and oriented to person, place, and time. Exhibits normal muscle tone. Gait normal. Coordination normal.  Skin: Skin is warm and dry. No rash noted. Not diaphoretic. No erythema. No pallor.  Psychiatric: Mood, memory and judgment normal.  Vitals reviewed.  LABORATORY DATA: Lab Results   Component Value Date   WBC 6.4 05/26/2022   HGB 11.5 (L) 05/26/2022   HCT 32.8 (L) 05/26/2022   MCV 95.1 05/26/2022   PLT 299 05/26/2022      Chemistry      Component Value Date/Time   NA 138 05/26/2022 1004   NA 140 08/30/2021 1659   K 3.8 05/26/2022 1004   CL 107 05/26/2022 1004   CO2 24 05/26/2022 1004   BUN 13 05/26/2022 1004   BUN 17 08/30/2021 1659   CREATININE 0.89 05/26/2022 1004   CREATININE 0.80 08/30/2012 0955      Component Value Date/Time   CALCIUM 9.2 05/26/2022 1004  ALKPHOS 69 05/26/2022 1004   AST 11 (L) 05/26/2022 1004   ALT 5 05/26/2022 1004   BILITOT 0.4 05/26/2022 1004       RADIOGRAPHIC STUDIES:  US Abdomen Limited RUQ (LIVER/GB)  Result Date: 04/29/2022 CLINICAL DATA:  Cirrhosis of liver. EXAM: ULTRASOUND ABDOMEN LIMITED RIGHT UPPER QUADRANT COMPARISON:  None Available. FINDINGS: Gallbladder: Gallstones in the gallbladder, largest measures 1.25 cm. No wall thickening visualized. No sonographic Murphy sign noted by sonographer. Common bile duct: Diameter: 3.9 mm Liver: No focal lesion identified. Heterogeneous coarsened echotexture of liver with mild lobular contour. Portal vein is patent on color Doppler imaging with normal direction of blood flow towards the liver. Other: None. IMPRESSION: 1. Cholelithiasis without evidence of acute cholecystitis. 2. Heterogeneous coarsened echotexture of liver with mild lobular contour, consistent with history of cirrhosis. Electronically Signed   By: Abelardo Diesel M.D.   On: 04/29/2022 10:07     ASSESSMENT/PLAN:  This is a very pleasant 72 years old Caucasian male recently diagnosed with at least stage IIIB (T3, N2, M0) non-small cell lung cancer, squamous cell carcinoma presented with large left lower lobe lung mass in addition to subcarinal and AP window lymphadenopathy diagnosed in September 2023.    He had a brain MRI that was negative for metastatic disease of the brain.    He completed a course of  concurrent chemoradiation with carboplatin for an AUC of 2 and paclitaxel 45 mg per metered squared.  He is status post 7 cycles of treatment.     The patient is scheduled to start consolidation immunotherapy with Imfinzi 1500 mg IV every 4 weeks. He is status post 2 cycles.   Labs were reviewed. Recommend he proceed with cycle 3 today as scheduled.   We will see him back for a follow up visit in 4 weeks for evaluation and repeat blood work before starting cycle #4.   Regarding his chronic pain, the patient is adamant that his practice through [facility] pain clinic with Dr. Collene Mares appears to have close down.  He states he has called and driven by the practice and it appears to be shut down.  I confirmed on PMP aware that the patient has not had a refill of his Norco since 04/12/2022.  It looks like this is filled at least monthly.  If he is unable to get in touch with them, I am happy to refer him to a different pain clinic.  He was agreeable to a referral to the [facility] pain clinic.  Of course, if he is able to get in touch with his other pain clinic he will continue to follow with them.  Regarding the blood in the semen, the patient is not sure how long this is going on.  He noticed that on 1 occasion it appeared to be old blood.  He denies any urinary or abdominal complaints at this time.  I will arrange for a UA just to ensure he does not have any microscopic blood in his urine which may be a bladder etiology.  Either way, I strongly encouraged the patient to follow-up with urology for further evaluation regarding this concern.  I will arrange for restaging CT scan the chest prior to his next appointment.  The patient was advised to call immediately if he has any concerning symptoms in the interval. The patient voices understanding of current disease status and treatment options and is in agreement with the current care plan. All questions were answered. The patient knows to call  the clinic with any  problems, questions or concerns. We can certainly see the patient much sooner if necessary       Orders Placed This Encounter  Procedures   CT Chest W Contrast    Standing Status:   Future    Standing Expiration Date:   05/26/2023    Order Specific Question:   If indicated for the ordered procedure, I authorize the administration of contrast media per Radiology protocol    Answer:   Yes    Order Specific Question:   Does the patient have a contrast media/X-ray dye allergy?    Answer:   No    Order Specific Question:   Preferred imaging location?    Answer:   [facility]   Urinalysis, Complete w Microscopic    Standing Status:   Future    Standing Expiration Date:   05/26/2023   Ambulatory referral to Pain Clinic    Referral Priority:   Routine    Referral Type:   Consultation    Referral Reason:   Specialty Services Required    Requested Specialty:   Pain Medicine    Number of Visits Requested:   1     The total time spent in the appointment was 20-29 minutes  Travoris Bushey L Flynn Lininger, PA-C 05/26/22

## 2022-05-26 ENCOUNTER — Other Ambulatory Visit: Payer: Self-pay

## 2022-05-26 ENCOUNTER — Inpatient Hospital Stay: Payer: Medicare Other

## 2022-05-26 ENCOUNTER — Inpatient Hospital Stay (HOSPITAL_BASED_OUTPATIENT_CLINIC_OR_DEPARTMENT_OTHER): Payer: Medicare Other | Admitting: Physician Assistant

## 2022-05-26 ENCOUNTER — Inpatient Hospital Stay: Payer: Medicare Other | Attending: Internal Medicine

## 2022-05-26 ENCOUNTER — Telehealth: Payer: Self-pay | Admitting: *Deleted

## 2022-05-26 VITALS — BP 125/72 | HR 68 | Temp 98.6°F | Resp 16 | Wt 147.6 lb

## 2022-05-26 DIAGNOSIS — C3412 Malignant neoplasm of upper lobe, left bronchus or lung: Secondary | ICD-10-CM | POA: Diagnosis not present

## 2022-05-26 DIAGNOSIS — G8929 Other chronic pain: Secondary | ICD-10-CM

## 2022-05-26 DIAGNOSIS — C771 Secondary and unspecified malignant neoplasm of intrathoracic lymph nodes: Secondary | ICD-10-CM | POA: Diagnosis not present

## 2022-05-26 DIAGNOSIS — Z79620 Long term (current) use of immunosuppressive biologic: Secondary | ICD-10-CM | POA: Diagnosis not present

## 2022-05-26 DIAGNOSIS — C3432 Malignant neoplasm of lower lobe, left bronchus or lung: Secondary | ICD-10-CM | POA: Insufficient documentation

## 2022-05-26 DIAGNOSIS — Z87891 Personal history of nicotine dependence: Secondary | ICD-10-CM | POA: Insufficient documentation

## 2022-05-26 DIAGNOSIS — Z5112 Encounter for antineoplastic immunotherapy: Secondary | ICD-10-CM

## 2022-05-26 LAB — URINALYSIS, COMPLETE (UACMP) WITH MICROSCOPIC
Bacteria, UA: NONE SEEN
Bilirubin Urine: NEGATIVE
Glucose, UA: NEGATIVE mg/dL
Ketones, ur: NEGATIVE mg/dL
Leukocytes,Ua: NEGATIVE
Nitrite: NEGATIVE
Protein, ur: NEGATIVE mg/dL
Specific Gravity, Urine: 1.01 (ref 1.005–1.030)
pH: 6 (ref 5.0–8.0)

## 2022-05-26 LAB — CBC WITH DIFFERENTIAL (CANCER CENTER ONLY)
Abs Immature Granulocytes: 0.01 10*3/uL (ref 0.00–0.07)
Basophils Absolute: 0.1 10*3/uL (ref 0.0–0.1)
Basophils Relative: 1 %
Eosinophils Absolute: 0.2 10*3/uL (ref 0.0–0.5)
Eosinophils Relative: 3 %
HCT: 32.8 % — ABNORMAL LOW (ref 39.0–52.0)
Hemoglobin: 11.5 g/dL — ABNORMAL LOW (ref 13.0–17.0)
Immature Granulocytes: 0 %
Lymphocytes Relative: 24 %
Lymphs Abs: 1.6 10*3/uL (ref 0.7–4.0)
MCH: 33.3 pg (ref 26.0–34.0)
MCHC: 35.1 g/dL (ref 30.0–36.0)
MCV: 95.1 fL (ref 80.0–100.0)
Monocytes Absolute: 0.7 10*3/uL (ref 0.1–1.0)
Monocytes Relative: 11 %
Neutro Abs: 3.9 10*3/uL (ref 1.7–7.7)
Neutrophils Relative %: 61 %
Platelet Count: 299 10*3/uL (ref 150–400)
RBC: 3.45 MIL/uL — ABNORMAL LOW (ref 4.22–5.81)
RDW: 13.6 % (ref 11.5–15.5)
WBC Count: 6.4 10*3/uL (ref 4.0–10.5)
nRBC: 0 % (ref 0.0–0.2)

## 2022-05-26 LAB — CMP (CANCER CENTER ONLY)
ALT: 5 U/L (ref 0–44)
AST: 11 U/L — ABNORMAL LOW (ref 15–41)
Albumin: 3.8 g/dL (ref 3.5–5.0)
Alkaline Phosphatase: 69 U/L (ref 38–126)
Anion gap: 7 (ref 5–15)
BUN: 13 mg/dL (ref 8–23)
CO2: 24 mmol/L (ref 22–32)
Calcium: 9.2 mg/dL (ref 8.9–10.3)
Chloride: 107 mmol/L (ref 98–111)
Creatinine: 0.89 mg/dL (ref 0.61–1.24)
GFR, Estimated: >60 mL/min (ref >60)
Glucose, Bld: 129 mg/dL — ABNORMAL HIGH (ref 70–99)
Potassium: 3.8 mmol/L (ref 3.5–5.1)
Sodium: 138 mmol/L (ref 135–145)
Total Bilirubin: 0.4 mg/dL (ref 0.3–1.2)
Total Protein: 7.3 g/dL (ref 6.5–8.1)

## 2022-05-26 LAB — TSH: TSH: 1.112 u[IU]/mL (ref 0.350–4.500)

## 2022-05-26 MED ORDER — SODIUM CHLORIDE 0.9 % IV SOLN
1500.0000 mg | Freq: Once | INTRAVENOUS | Status: AC
  Administered 2022-05-26: 1500 mg via INTRAVENOUS
  Filled 2022-05-26: qty 30

## 2022-05-26 MED ORDER — SODIUM CHLORIDE 0.9 % IV SOLN: Freq: Once | INTRAVENOUS | Status: AC

## 2022-05-26 NOTE — Patient Instructions (Signed)
[facility]   Discharge Instructions: Thank you for choosing [facility] to provide your oncology and hematology care.   If you have a lab appointment with the [facility], please go directly to the [facility] and check in at the registration area.   Wear comfortable clothing and clothing appropriate for easy access to any Portacath or PICC line.   We strive to give you quality time with your provider. You may need to reschedule your appointment if you arrive late (15 or more minutes).  Arriving late affects you and other patients whose appointments are after yours.  Also, if you miss three or more appointments without notifying the office, you may be dismissed from the clinic at the provider's discretion.      For prescription refill requests, have your pharmacy contact our office and allow 72 hours for refills to be completed.    Today you received the following chemotherapy and/or immunotherapy agents: Durvalumab (Imfinzi)      To help prevent nausea and vomiting after your treatment, we encourage you to take your nausea medication as directed.  BELOW ARE SYMPTOMS THAT SHOULD BE REPORTED IMMEDIATELY: *FEVER GREATER THAN 100.4 F (38 C) OR HIGHER *CHILLS OR SWEATING *NAUSEA AND VOMITING THAT IS NOT CONTROLLED WITH YOUR NAUSEA MEDICATION *UNUSUAL SHORTNESS OF BREATH *UNUSUAL BRUISING OR BLEEDING *URINARY PROBLEMS (pain or burning when urinating, or frequent urination) *BOWEL PROBLEMS (unusual diarrhea, constipation, pain near the anus) TENDERNESS IN MOUTH AND THROAT WITH OR WITHOUT PRESENCE OF ULCERS (sore throat, sores in mouth, or a toothache) UNUSUAL RASH, SWELLING OR PAIN  UNUSUAL VAGINAL DISCHARGE OR ITCHING   Items with * indicate a potential emergency and should be followed up as soon as possible or go to the Emergency Department if any problems should occur.  Please show the CHEMOTHERAPY ALERT CARD or IMMUNOTHERAPY ALERT  CARD at check-in to the Emergency Department and triage nurse.  Should you have questions after your visit or need to cancel or reschedule your appointment, please contact [facility]  Dept: [phone]  and follow the prompts.  Office hours are 8:00 a.m. to 4:30 p.m. Monday - Friday. Please note that voicemails left after 4:00 p.m. may not be returned until the following business day.  We are closed weekends and major holidays. You have access to a nurse at all times for urgent questions. Please call the main number to the clinic Dept: [phone] and follow the prompts.   For any non-urgent questions, you may also contact your provider using MyChart. We now offer e-Visits for anyone 36 and older to request care online for non-urgent symptoms. For details visit [email or web address].   Also download the MyChart app! Go to the app store, search "MyChart", open the app, select [facility], and log in with your MyChart username and password.

## 2022-05-26 NOTE — Telephone Encounter (Signed)
Patient demographic sheet & today's office note faxed to [facility] for pain management referral - referral coordinator, Lessie Dings.  Fax confirmation received.

## 2022-05-27 LAB — T4: T4, Total: 6.8 ug/dL (ref 4.5–12.0)

## 2022-06-06 ENCOUNTER — Other Ambulatory Visit: Payer: Self-pay | Admitting: Internal Medicine

## 2022-06-06 NOTE — Telephone Encounter (Signed)
Prescription refill request for Eliquis received. Indication: PAF Last office visit: 06/15/21  Beckie Salts MD Scr: 0.89 on 05/26/22  Epic Age: 72 Weight: 66.6kg  Based on above findings Eliquis 5mg  twice daily is the appropriate dose.  Refill approved.

## 2022-06-07 DIAGNOSIS — Z79899 Other long term (current) drug therapy: Secondary | ICD-10-CM | POA: Diagnosis not present

## 2022-06-07 DIAGNOSIS — M129 Arthropathy, unspecified: Secondary | ICD-10-CM | POA: Diagnosis not present

## 2022-06-07 DIAGNOSIS — G894 Chronic pain syndrome: Secondary | ICD-10-CM | POA: Diagnosis not present

## 2022-06-07 DIAGNOSIS — M549 Dorsalgia, unspecified: Secondary | ICD-10-CM | POA: Diagnosis not present

## 2022-06-07 DIAGNOSIS — Z6822 Body mass index (BMI) 22.0-22.9, adult: Secondary | ICD-10-CM | POA: Diagnosis not present

## 2022-06-07 DIAGNOSIS — G8929 Other chronic pain: Secondary | ICD-10-CM | POA: Diagnosis not present

## 2022-06-07 DIAGNOSIS — E559 Vitamin D deficiency, unspecified: Secondary | ICD-10-CM | POA: Diagnosis not present

## 2022-06-07 DIAGNOSIS — M542 Cervicalgia: Secondary | ICD-10-CM | POA: Diagnosis not present

## 2022-06-14 ENCOUNTER — Other Ambulatory Visit: Payer: Self-pay

## 2022-06-17 ENCOUNTER — Ambulatory Visit (HOSPITAL_COMMUNITY)
Admission: RE | Admit: 2022-06-17 | Discharge: 2022-06-17 | Disposition: A | Discharge status: Home / Self Care | Payer: Medicare Other | Source: Ambulatory Visit | Attending: Physician Assistant | Admitting: Physician Assistant

## 2022-06-17 ENCOUNTER — Other Ambulatory Visit: Payer: Self-pay

## 2022-06-17 DIAGNOSIS — C3412 Malignant neoplasm of upper lobe, left bronchus or lung: Secondary | ICD-10-CM | POA: Insufficient documentation

## 2022-06-17 DIAGNOSIS — J90 Pleural effusion, not elsewhere classified: Secondary | ICD-10-CM | POA: Diagnosis not present

## 2022-06-17 DIAGNOSIS — I700 Atherosclerosis of aorta: Secondary | ICD-10-CM | POA: Diagnosis not present

## 2022-06-17 DIAGNOSIS — J439 Emphysema, unspecified: Secondary | ICD-10-CM | POA: Diagnosis not present

## 2022-06-17 DIAGNOSIS — J479 Bronchiectasis, uncomplicated: Secondary | ICD-10-CM | POA: Diagnosis not present

## 2022-06-17 MED ORDER — IOHEXOL 300 MG/ML  SOLN
75.0000 mL | Freq: Once | INTRAMUSCULAR | Status: AC | PRN
  Administered 2022-06-17: 75 mL via INTRAVENOUS

## 2022-06-17 NOTE — Progress Notes (Signed)
  Evaluation after Normal Saline / Contrast Extravasation  Patient seen and examined immediately after contrast extravasation while in CT.  Exam: There is mild swelling at the left forearm  area.  There is no erythema. There is no discoloration. There are no blisters. There are no signs of decreased perfusion of the skin.  It is warm to touch.  The patient has full ROM in fingers.  Radial pulse is normal.  Per contrast extravasation protocol, I have instructed the patient to keep an ice pack on the area for 20-60 minutes at a time for about 48 hours.   Keep arm elevated as much as possible.   The patient understands to call the radiology department if there is: - increase in pain or swelling - changed or altered sensation - ulceration or blistering - increasing redness - warmth or increasing firmness - decreased tissue perfusion as noted by decreased capillary refill or discoloration of skin - decreased pulses peripheral to site   Alene Mires PA-C 06/17/2022 8:14 AM

## 2022-06-22 ENCOUNTER — Other Ambulatory Visit: Payer: Self-pay

## 2022-06-23 ENCOUNTER — Inpatient Hospital Stay: Payer: Medicare Other

## 2022-06-23 ENCOUNTER — Inpatient Hospital Stay: Payer: Medicare Other | Admitting: Internal Medicine

## 2022-06-23 ENCOUNTER — Telehealth: Payer: Self-pay | Admitting: Medical Oncology

## 2022-06-23 NOTE — Telephone Encounter (Signed)
LVM to return my call about pt missed appts.

## 2022-06-30 DIAGNOSIS — G8929 Other chronic pain: Secondary | ICD-10-CM | POA: Diagnosis not present

## 2022-06-30 DIAGNOSIS — M5136 Other intervertebral disc degeneration, lumbar region: Secondary | ICD-10-CM | POA: Diagnosis not present

## 2022-06-30 DIAGNOSIS — M545 Low back pain, unspecified: Secondary | ICD-10-CM | POA: Diagnosis not present

## 2022-07-04 ENCOUNTER — Other Ambulatory Visit: Payer: Self-pay | Admitting: Physician Assistant

## 2022-07-04 ENCOUNTER — Telehealth: Payer: Self-pay | Admitting: Internal Medicine

## 2022-07-04 NOTE — Telephone Encounter (Signed)
Called patient's regarding 04/25 appointments, left a voicemail.

## 2022-07-05 ENCOUNTER — Other Ambulatory Visit: Payer: Self-pay

## 2022-07-05 ENCOUNTER — Ambulatory Visit: Payer: Medicare Other | Attending: Internal Medicine | Admitting: Internal Medicine

## 2022-07-05 ENCOUNTER — Encounter: Payer: Self-pay | Admitting: Internal Medicine

## 2022-07-05 ENCOUNTER — Other Ambulatory Visit: Payer: Self-pay | Admitting: Physician Assistant

## 2022-07-05 ENCOUNTER — Telehealth: Payer: Self-pay | Admitting: Internal Medicine

## 2022-07-05 VITALS — BP 122/80 | HR 71 | Ht 71.0 in | Wt 147.2 lb

## 2022-07-05 DIAGNOSIS — I480 Paroxysmal atrial fibrillation: Secondary | ICD-10-CM

## 2022-07-05 NOTE — Telephone Encounter (Signed)
Rescheduled April appointments, informed patient regarding upcoming appointments.

## 2022-07-05 NOTE — Progress Notes (Signed)
HPI Mr. Shenberger returns today for followup of atrial fib. He is a pleasant 72 yo man with a cryptogenic stroke, s/p ILR which demonstrated atrial fib. He has been prescribed eliquis but was only taking one tab a day but now is taking twice. His bp is not being checked. He has not had any bleeding. No chest pain or sob. No edema. 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 He was noted to have microscopic hematuria and is pending renal eval. Allergies  Allergen Reactions   Percocet [Oxycodone-Acetaminophen] Itching     Current Outpatient Medications  Medication Sig Dispense Refill   durvalumab 1,500 mg in sodium chloride 0.9 % 100 mL Inject 1,500 mg into the vein every 28 (twenty-eight) days.     ELIQUIS 5 MG TABS tablet TAKE ONE TABLET BY MOUTH TWICE DAILY 60 tablet 5   fluticasone (FLONASE) 50 MCG/ACT nasal spray Place 1 spray into the nose in the morning.     gabapentin (NEURONTIN) 300 MG capsule Take 1 capsule (300 mg total) by mouth 3 (three) times daily. Take one capsule twice to three times daily (Patient taking differently: Take 300 mg by mouth 2 (two) times daily.) 270 capsule 3   HYDROcodone-acetaminophen (HYCET) 7.5-325 mg/15 ml solution Take 10 mLs by mouth 4 (four) times daily as needed for moderate pain. 120 mL 0   Omega-3 Fatty Acids (FISH OIL) 1000 MG CAPS Take 1,000 mg by mouth in the morning and at bedtime.     omeprazole (PRILOSEC) 20 MG capsule Take 1 capsule (20 mg total) by mouth daily. 90 capsule 3   pantoprazole (PROTONIX) 40 MG tablet Take 1 tablet (40 mg total) by mouth daily. 90 tablet 3   pravastatin (PRAVACHOL) 20 MG tablet Take 1 tablet (20 mg total) by mouth daily. Take one tablet once daily (Patient taking differently: Take 20 mg by mouth at bedtime. Take one tablet once daily) 90 tablet 3   prochlorperazine (COMPAZINE) 10 MG tablet Take 1 tablet (10 mg total) by mouth every 6 (six) hours as needed for nausea or vomiting. (Patient not taking: Reported on  05/26/2022) 30 tablet 1   tamsulosin (FLOMAX) 0.4 MG CAPS capsule Take 1 capsule (0.4 mg total) by mouth daily. (Patient not taking: Reported on 07/05/2022) 90 capsule 3   Current Facility-Administered Medications  Medication Dose Route Frequency Provider Last Rate Last Admin   methylPREDNISolone acetate (DEPO-MEDROL) injection 80 mg  80 mg Intramuscular Once Dettinger, Elige Radon, MD         Past Medical History:  Diagnosis Date   Anxiety    Arthritis    Atrial fibrillation    Chronic back pain    buldging disc   Chronic neck pain    ruptured disc   Cough    hx smoking   Diverticulosis    Esophageal stricture    GERD (gastroesophageal reflux disease)    takes Prilosec daily   H/O hiatal hernia    Hearing loss    Hepatitis-C 2017   treated and cured per patient   Hiatal hernia    History of colon polyps    History of radiation therapy    Left Lung- 01/10/22-02/18/22- Dr. Antony Blackbird   Insomnia    takes Elavil nightly   Lipoma of abdominal wall 02/11/2011   Lung cancer    Stroke    x 2   Type 2 diabetes mellitus 07/22/2020   patient denies DM, per MD note pt  is pre-diabetic    ROS:   All systems reviewed and negative except as noted in the HPI.   Past Surgical History:  Procedure Laterality Date   ANTERIOR CERVICAL DECOMP/DISCECTOMY FUSION  04/12/2011   Procedure: ANTERIOR CERVICAL DECOMPRESSION/DISCECTOMY FUSION 2 LEVELS;  Surgeon: Dorian Heckle, MD;  Location: [facility];  Service: Neurosurgery;  Laterality: N/A;  exploration of Cervical four - seven  fusion with redo Cervical six- seven, cervical three- four anterior cervical decompression with fusion interbody prothesis plating and bonegraft and C34 anterior cervical decompression with inte   APPENDECTOMY     at age 70   BRONCHIAL BIOPSY  11/30/2021   Procedure: BRONCHIAL BIOPSIES;  Surgeon: Josephine Igo, DO;  Location: [facility];  Service: Pulmonary;;   BRONCHIAL BRUSHINGS  11/30/2021   Procedure:  BRONCHIAL BRUSHINGS;  Surgeon: Josephine Igo, DO;  Location: [facility];  Service: Pulmonary;;   CARDIAC CATHETERIZATION  06/03/2004   COLONOSCOPY     EP IMPLANTABLE DEVICE N/A 09/14/2015   Procedure: Loop Recorder Insertion;  Surgeon: Marinus Maw, MD;  Location: [facility];  Service: Cardiovascular;  Laterality: N/A;   FINE NEEDLE ASPIRATION  11/30/2021   Procedure: FINE NEEDLE ASPIRATION (FNA) LINEAR;  Surgeon: Josephine Igo, DO;  Location: [facility];  Service: Pulmonary;;   HAND SURGERY Right 03/15/2003   right x 3   INNER EAR SURGERY Bilateral    bil;titanium in both ears   lower back surgery  03/15/2007   had plates and screws inserted   NECK SURGERY  03/15/2007   had insertion of  plates and screws   TEE WITHOUT CARDIOVERSION N/A 09/14/2015   Procedure: TRANSESOPHAGEAL ECHOCARDIOGRAM (TEE);  Surgeon: Vesta Mixer, MD;  Location: [facility] END[facility];  Service: Cardiovascular;  Laterality: N/A;   TRANSFORAMINAL LUMBAR INTERBODY FUSION (TLIF) WITH PEDICLE SCREW FIXATION 1 LEVEL Left 09/27/2019   Procedure: Left Lumbar 5 Sacral 1 Transforaminal lumbar interbody fusion with exploration/removal of adjacent level hardware;  Surgeon: Maeola Harman, MD;  Location[facility];  Service: Neurosurgery;  Laterality: Left;  3C/RM 19   VIDEO BRONCHOSCOPY WITH ENDOBRONCHIAL ULTRASOUND N/A 11/30/2021   Procedure: VIDEO BRONCHOSCOPY WITH ENDOBRONCHIAL ULTRASOUND;  Surgeon: Josephine Igo, DO;  Location: [facility];  Service: Pulmonary;  Laterality: N/A;     Family History  Problem Relation Age of Onset   Other Mother        Perforated bowel   Cancer Father    Colon cancer Father    Cancer Brother        GI   Colon cancer Maternal Grandmother    Colon cancer Maternal Grandfather    Healthy Daughter    Healthy Son    Heart disease Maternal Uncle    Stroke Maternal Uncle    Anesthesia problems Neg Hx    Hypotension Neg Hx    Malignant hyperthermia Neg Hx    Pseudochol  deficiency Neg Hx      Social History   Socioeconomic History   Marital status: Widowed    Spouse name: Britta Mccreedy   Number of children: 2   Years of education: 8th Grade   Highest education level: 8th grade  Occupational History   Occupation: disabled    Employer: DISABLED  Tobacco Use   Smoking status: Former    Packs/day: 1.00    Years: 41.00    Additional pack years: 0.00    Total pack years: 41.00    Types: Cigarettes    Quit date: 06/29/2002  Years since quitting: 20.0   Smokeless tobacco: Never   Tobacco comments:    Pt quit smoking in 2004. 10/21/21  Vaping Use   Vaping Use: Never used  Substance and Sexual Activity   Alcohol use: Not Currently    Alcohol/week: 1.0 standard drink of alcohol    Types: 1 Cans of beer per week   Drug use: No   Sexual activity: Yes    Birth control/protection: None  Other Topics Concern   Not on file  Social History Narrative   Lives alone, his wife passed away 01/27/2019.  Has 2 children.     Currently does not work.  On disability for wrist pain since early 2000s.   Formerly a Naval architect.   Social Determinants of Health   Financial Resource Strain: High Risk (02/22/2022)   Overall Financial Resource Strain (CARDIA)    Difficulty of Paying Living Expenses: Very hard  Food Insecurity: No Food Insecurity (11/19/2020)   Hunger Vital Sign    Worried About Running Out of Food in the Last Year: Never true    Ran Out of Food in the Last Year: Never true  Transportation Needs: No Transportation Needs (11/19/2020)   PRAPARE - Administrator, Civil Service (Medical): No    Lack of Transportation (Non-Medical): No  Physical Activity: Insufficiently Active (11/19/2020)   Exercise Vital Sign    Days of Exercise per Week: 7 days    Minutes of Exercise per Session: 20 min  Stress: No Stress Concern Present (11/19/2020)   [employer] of Occupational Health - Occupational Stress Questionnaire    Feeling of Stress : Only a little   Social Connections: Socially Isolated (11/19/2020)   Social Connection and Isolation Panel [NHANES]    Frequency of Communication with Friends and Family: More than three times a week    Frequency of Social Gatherings with Friends and Family: More than three times a week    Attends Religious Services: Never    Database administrator or Organizations: No    Attends Banker Meetings: Never    Marital Status: Widowed  Intimate Partner Violence: Not At Risk (11/19/2020)   Humiliation, Afraid, Rape, and Kick questionnaire    Fear of Current or Ex-Partner: No    Emotionally Abused: No    Physically Abused: No    Sexually Abused: No     Ht  (1.803 m)   Wt 147 lb 3.2 oz (66.8 kg)   SpO2 97%   BMI 20.53 kg/m   Physical Exam:  Well appearing NAD HEENT: Unremarkable Neck:  No JVD, no thyromegally Lymphatics:  No adenopathy Back:  No CVA tenderness Lungs:  Clear with no with no wheezes HEART:  Regular rate rhythm, no murmurs, no rubs, no clicks Abd:  soft, positive bowel sounds, no organomegally, no rebound, no guarding Ext:  2 plus pulses, no edema, no cyanosis, no clubbing Skin:  No rashes no nodules Neuro:  CN II through XII intact, motor grossly intact  EKG - nsr   Assess/Plan: 1. PAF - he is mostly maintaining nsr. His symptoms are improved.  2. Coags - he has increased his dose of eliquis and is taking it twice daily. 3. orthostasis - I encouraged him to eat more salty foods. He is better. 4. Stroke - he has not had any recurrent symptoms. He is working without limit.   Sharlot Gowda Nalani Andreen,MD

## 2022-07-05 NOTE — Patient Instructions (Signed)
Medication Instructions:  Your physician recommends that you continue on your current medications as directed. Please refer to the Current Medication list given to you today.  *If you need a refill on your cardiac medications before your next appointment, please call your pharmacy*   Lab Work: NONE   If you have labs (blood work) drawn today and your tests are completely normal, you will receive your results only by: MyChart Message (if you have MyChart) OR A paper copy in the mail If you have any lab test that is abnormal or we need to change your treatment, we will call you to review the results.   Testing/Procedures: NONE    Follow-Up: At [facility], you and your health needs are our priority.  As part of our continuing mission to provide you with exceptional heart care, we have created designated Provider Care Teams.  These Care Teams include your primary Cardiologist (physician) and Advanced Practice Providers (APPs -  Physician Assistants and Nurse Practitioners) who all work together to provide you with the care you need, when you need it.  We recommend signing up for the patient portal called "MyChart".  Sign up information is provided on this After Visit Summary.  MyChart is used to connect with patients for Virtual Visits (Telemedicine).  Patients are able to view lab/test results, encounter notes, upcoming appointments, etc.  Non-urgent messages can be sent to your provider as well.   To learn more about what you can do with MyChart, go to [email or web address].    Your next appointment:    As Needed   Provider:   Gregg Taylor, MD    Other Instructions Thank you for choosing [facility]!    

## 2022-07-07 ENCOUNTER — Other Ambulatory Visit: Payer: Medicare Other

## 2022-07-07 ENCOUNTER — Ambulatory Visit: Payer: Medicare Other | Admitting: Internal Medicine

## 2022-07-07 ENCOUNTER — Ambulatory Visit: Payer: Medicare Other

## 2022-07-12 NOTE — Progress Notes (Unsigned)
[facility] OFFICE PROGRESS NOTE  Cameron Roman [address] [place] [address]  DIAGNOSIS: Stage IIIB (T3, N2, M0) non-small cell lung cancer, squamous cell carcinoma presented with large left lower lobe lung mass in addition subcarinal and AP window nodal metastases. Diagnosed in September 2023.      PRIOR THERAPY: Concurrent chemo/radiation with carboplatin for an AUC of 2 and paclitaxel 45 mg/m2 weekly. First dose expected 01/10/22. Status post 7 cycles.  Last dose was giving February 15, 2022 with partial response.    CURRENT THERAPY: Consolidation treatment with immunotherapy with Imfinzi 1500 Mg IV every 4 weeks. First dose March 31, 2022.  Status post 3 cycles.   INTERVAL HISTORY: Cameron Roman 72 y.o. male returns to the clinic today for a follow-up visit.  The patient was last seen on 05/26/2022.  He missed his appointments in April. The scheduling department tried to call him on several occasions.   Otherwise, the patient is currently on treatment with consolidation immunotherapy with Imfinzi every 4 weeks.  He is status post 3 cycles.  He tolerates it well without any concerning adverse side effects.  Today he denies any fever, chills, night sweats, or unexplained weight loss. He has baseline shortness of breath with exertion due to his COPD. Denies any chest pain or hemoptysis.  He reports baseline dry cough which is unchanged.  Denies any headache or visual changes.  Denies any rashes or skin changes.  Denies any nausea, vomiting, diarrhea, or constipation.  At his last appointment, he was endorsing blood in his semen.  He had a UA which showed microscopic blood.  He was advised to follow-up with urology. He states he called them but is waiting for a call back.  He is an established patient of [facility].  Of note the patient is on a blood thinner and recently saw his cardiologist.  The patient recently had a restaging CT scan of his chest.  He is here  today for evaluation and to review his scan results before starting cycle #4.    MEDICAL HISTORY: Past Medical History:  Diagnosis Date   Anxiety    Arthritis    Atrial fibrillation (HCC)    Chronic back pain    buldging disc   Chronic neck pain    ruptured disc   Cough    hx smoking   Diverticulosis    Esophageal stricture    GERD (gastroesophageal reflux disease)    takes Prilosec daily   H/O hiatal hernia    Hearing loss    Hepatitis-C 2017   treated and cured per patient   Hiatal hernia    History of colon polyps    History of radiation therapy    Left Lung- 01/10/22-02/18/22- Dr. Antony Roman   Insomnia    takes Elavil nightly   Lipoma of abdominal wall 02/11/2011   Lung cancer (HCC)    Stroke (HCC)    x 2   Type 2 diabetes mellitus (HCC) 07/22/2020   patient denies DM, per MD note pt is pre-diabetic    ALLERGIES:  is allergic to percocet [oxycodone-acetaminophen].  MEDICATIONS:  Current Outpatient Medications  Medication Sig Dispense Refill   sucralfate (CARAFATE) 1 g tablet Take 1 g by mouth 4 (four) times daily.     durvalumab 1,500 mg in sodium chloride 0.9 % 100 mL Inject 1,500 mg into the vein every 28 (twenty-eight) days.     ELIQUIS 5 MG TABS tablet TAKE ONE  TABLET BY MOUTH TWICE DAILY 60 tablet 5   fluticasone (FLONASE) 50 MCG/ACT nasal spray Place 1 spray into the nose in the morning.     gabapentin (NEURONTIN) 300 MG capsule Take 1 capsule (300 mg total) by mouth 3 (three) times daily. Take one capsule twice to three times daily (Patient taking differently: Take 300 mg by mouth 2 (two) times daily.) 270 capsule 3   HYDROcodone-acetaminophen (HYCET) 7.5-325 mg/15 ml solution Take 10 mLs by mouth 4 (four) times daily as needed for moderate pain. 120 mL 0   Omega-3 Fatty Acids (FISH OIL) 1000 MG CAPS Take 1,000 mg by mouth in the morning and at bedtime.     omeprazole (PRILOSEC) 20 MG capsule Take 1 capsule (20 mg total) by mouth daily. 90 capsule 3    pantoprazole (PROTONIX) 40 MG tablet Take 1 tablet (40 mg total) by mouth daily. 90 tablet 3   pravastatin (PRAVACHOL) 20 MG tablet Take 1 tablet (20 mg total) by mouth daily. Take one tablet once daily (Patient taking differently: Take 20 mg by mouth at bedtime. Take one tablet once daily) 90 tablet 3   prochlorperazine (COMPAZINE) 10 MG tablet Take 1 tablet (10 mg total) by mouth every 6 (six) hours as needed for nausea or vomiting. (Patient not taking: Reported on 05/26/2022) 30 tablet 1   tamsulosin (FLOMAX) 0.4 MG CAPS capsule Take 1 capsule (0.4 mg total) by mouth daily. (Patient not taking: Reported on 07/05/2022) 90 capsule 3   Current Facility-Administered Medications  Medication Dose Route Frequency Provider Last Rate Last Admin   methylPREDNISolone acetate (DEPO-MEDROL) injection 80 mg  80 mg Intramuscular Once Dettinger, Elige Radon, MD        SURGICAL HISTORY:  Past Surgical History:  Procedure Laterality Date   ANTERIOR CERVICAL DECOMP/DISCECTOMY FUSION  04/12/2011   Procedure: ANTERIOR CERVICAL DECOMPRESSION/DISCECTOMY FUSION 2 LEVELS;  Surgeon: Dorian Heckle, MD;  Location: [facility];  Service: Neurosurgery;  Laterality: N/A;  exploration of Cervical four - seven  fusion with redo Cervical six- seven, cervical three- four anterior cervical decompression with fusion interbody prothesis plating and bonegraft and C34 anterior cervical decompression with inte   APPENDECTOMY     at age 28   BRONCHIAL BIOPSY  11/30/2021   Procedure: BRONCHIAL BIOPSIES;  Surgeon: Josephine Igo, DO;  Location: [facility];  Service: Pulmonary;;   BRONCHIAL BRUSHINGS  11/30/2021   Procedure: BRONCHIAL BRUSHINGS;  Surgeon: Josephine Igo, DO;  Location: [facility];  Service: Pulmonary;;   CARDIAC CATHETERIZATION  06/03/2004   COLONOSCOPY     EP IMPLANTABLE DEVICE N/A 09/14/2015   Procedure: Loop Recorder Insertion;  Surgeon: Marinus Maw, MD;  Location: [facility];  Service:  Cardiovascular;  Laterality: N/A;   FINE NEEDLE ASPIRATION  11/30/2021   Procedure: FINE NEEDLE ASPIRATION (FNA) LINEAR;  Surgeon: Josephine Igo, DO;  Location: [facility];  Service: Pulmonary;;   HAND SURGERY Right 03/15/2003   right x 3   INNER EAR SURGERY Bilateral    bil;titanium in both ears   lower back surgery  03/15/2007   had plates and screws inserted   NECK SURGERY  03/15/2007   had insertion of  plates and screws   TEE WITHOUT CARDIOVERSION N/A 09/14/2015   Procedure: TRANSESOPHAGEAL ECHOCARDIOGRAM (TEE);  Surgeon: Vesta Mixer, MD;  Location: [facility] ENDOSCOPY;  Service: Cardiovascular;  Laterality: N/A;   TRANSFORAMINAL LUMBAR INTERBODY FUSION (TLIF) WITH PEDICLE SCREW FIXATION 1 LEVEL Left 09/27/2019  Procedure: Left Lumbar 5 Sacral 1 Transforaminal lumbar interbody fusion with exploration/removal of adjacent level hardware;  Surgeon: Maeola Harman, MD;  Location: [facility];  Service: Neurosurgery;  Laterality: Left;  3C/RM 19   VIDEO BRONCHOSCOPY WITH ENDOBRONCHIAL ULTRASOUND N/A 11/30/2021   Procedure: VIDEO BRONCHOSCOPY WITH ENDOBRONCHIAL ULTRASOUND;  Surgeon: Josephine Igo, DO;  Location: [facility];  Service: Pulmonary;  Laterality: N/A;    REVIEW OF SYSTEMS:   Constitutional: Positive for fatigue. Negative for appetite change, chills, fatigue, fever and unexpected weight change.  HENT:   Negative for mouth sores, nosebleeds, sore throat and trouble swallowing.   Eyes: Negative for eye problems and icterus.  Respiratory: Positive for stable dyspnea on exertion. Positive for stable dry cough. Negative for hemoptysis and wheezing.   Cardiovascular: Negative for chest pain and leg swelling.  Gastrointestinal: Negative for abdominal pain, constipation, diarrhea, nausea and vomiting.  Genitourinary: Reported blood in the semen.  Negative for bladder incontinence, difficulty urinating, dysuria, frequency and hematuria.   Musculoskeletal: Positive for chronic pain.   Negative for back pain, gait problem, neck pain and neck stiffness.  Skin: Negative for itching and rash.  Neurological: Negative for dizziness, extremity weakness, gait problem, headaches, light-headedness and seizures.  Hematological: Negative for adenopathy. Does not bruise/bleed easily.  Psychiatric/Behavioral: Negative for confusion, depression and sleep disturbance. The patient is not nervous/anxious.     PHYSICAL EXAMINATION:  Blood pressure 134/76, pulse 71, temperature 98.4 F (36.9 C), resp. rate 18, weight 147 lb 3.2 oz (66.8 kg), SpO2 97 %.  ECOG PERFORMANCE STATUS: 1  Physical Exam  Constitutional: Oriented to person, place, and time and thin appearing male and in no distress. No distress.  HENT:  Head: Normocephalic and atraumatic.  Mouth/Throat: Oropharynx is clear and moist. No oropharyngeal exudate.  Eyes: Conjunctivae are normal. Right eye exhibits no discharge. Left eye exhibits no discharge. No scleral icterus.  Neck: Normal range of motion. Neck supple.  Cardiovascular: Normal rate, regular rhythm, normal heart sounds and intact distal pulses.   Pulmonary/Chest: Effort normal. Positive for decreased breath sounds bilaterally. No respiratory distress. No wheezes. No rales.  Abdominal: Exhibits no distension Musculoskeletal: Normal range of motion. Exhibits no edema.  Lymphadenopathy:    No cervical adenopathy.  Neurological: Alert and oriented to person, place, and time. Exhibits normal muscle tone. Gait normal. Coordination normal.  Skin: Skin is warm and dry. No rash noted. Not diaphoretic. No erythema. No pallor.  Psychiatric: Mood, memory and judgment normal.  Vitals reviewed.  LABORATORY DATA: Lab Results  Component Value Date   WBC 7.3 07/14/2022   HGB 11.8 (L) 07/14/2022   HCT 33.8 (L) 07/14/2022   MCV 92.6 07/14/2022   PLT 300 07/14/2022      Chemistry      Component Value Date/Time   NA 139 07/14/2022 0830   NA 140 08/30/2021 1659   K 4.1  07/14/2022 0830   CL 110 07/14/2022 0830   CO2 23 07/14/2022 0830   BUN 16 07/14/2022 0830   BUN 17 08/30/2021 1659   CREATININE 0.83 07/14/2022 0830   CREATININE 0.80 08/30/2012 0955      Component Value Date/Time   CALCIUM 8.8 (L) 07/14/2022 0830   ALKPHOS 70 07/14/2022 0830   AST 11 (L) 07/14/2022 0830   ALT <5 07/14/2022 0830   BILITOT 0.3 07/14/2022 0830       RADIOGRAPHIC STUDIES:  CT Chest W Contrast  Result Date: 06/17/2022 CLINICAL DATA:  Non small cell lung cancer, assess treatment  response. * Tracking Code: BO * EXAM: CT CHEST WITH CONTRAST TECHNIQUE: Multidetector CT imaging of the chest was performed during intravenous contrast administration. RADIATION DOSE REDUCTION: This exam was performed according to the departmental dose-optimization program which includes automated exposure control, adjustment of the mA and/or kV according to patient size and/or use of iterative reconstruction technique. CONTRAST:  75mL OMNIPAQUE IOHEXOL 300 MG/ML  SOLN COMPARISON:  03/17/2022. FINDINGS: Cardiovascular: Atherosclerotic calcification of the aorta and coronary arteries. Heart size normal. Small amount of pericardial fluid, likely physiologic. Mediastinum/Nodes: Similar small mediastinal and hilar lymph nodes. Soft tissue thickening in the left hilum, post treatment. No axillary adenopathy. Mid esophageal wall thickening, possibly treatment related. Lungs/Pleura: Bullous paraseptal and centrilobular emphysema. Increasing post radiation parenchymal retraction, patchy consolidation, bronchiectasis and architectural distortion in the medial left hemithorax. A measurable mass is not identified within confluent parenchymal retraction in the medial left lower lobe. Mild basilar predominant coarsened subpleural ground-glass and reticular densities. Trace left pleural fluid. Airway is unremarkable. Upper Abdomen: Visualized portions of the liver, adrenal glands, kidneys, spleen, pancreas, stomach and  bowel are grossly unremarkable. No upper abdominal adenopathy. Musculoskeletal: Degenerative changes in the spine. No worrisome lytic or sclerotic lesions. IMPRESSION: 1. Evolving post radiation changes in the left hemithorax with trace left pleural fluid. No evidence of recurrent or metastatic disease. 2. Mild basilar subpleural fibrosis may be due to usual interstitial pneumonitis or fibrotic nonspecific interstitial pneumonitis. 3. Esophageal wall thickening may be treatment related. 4. Aortic atherosclerosis (ICD10-I70.0). Coronary artery calcification. 5.  Emphysema (ICD10-J43.9). Electronically Signed   By: Leanna Battles M.D.   On: 06/17/2022 10:37     ASSESSMENT/PLAN:  This is a very pleasant 72 years old Caucasian male recently diagnosed with at least stage IIIB (T3, N2, M0) non-small cell lung cancer, squamous cell carcinoma presented with large left lower lobe lung mass in addition to subcarinal and AP window lymphadenopathy diagnosed in September 2023.    He had a brain MRI that was negative for metastatic disease of the brain.    He completed a course of concurrent chemoradiation with carboplatin for an AUC of 2 and paclitaxel 45 mg per metered squared.  He is status post 7 cycles of treatment.     The patient is scheduled to start consolidation immunotherapy with Imfinzi 1500 mg IV every 4 weeks. He is status post 3 cycles.   The patient recently had a restaging CT scan performed.  Dr. Arbutus Ped personally and independently reviewed the scan and discussed results with the patient today.  The scan did not show any evidence of disease progression.  Recommend he continue on the same treatment at the same dose.  He will proceed with cycle #4 today scheduled.  We will see him back for follow-up visit in 4 weeks for evaluation repeat blood work before undergoing cycle #5.  He will call urology to follow up and arrange appointment.   I gave him a copy of his schedule.   The patient was  advised to call immediately if he has any concerning symptoms in the interval. The patient voices understanding of current disease status and treatment options and is in agreement with the current care plan. All questions were answered. The patient knows to call the clinic with any problems, questions or concerns. We can certainly see the patient much sooner if necessary  No orders of the defined types were placed in this encounter.  Cameron Koc L Sohil Timko, PA-C 07/14/22  ADDENDUM: Hematology/Oncology Attending: I had a face-to-face  encounter with the patient today.  I reviewed his record, lab, scan and recommended his care plan.  This is a very pleasant 72 years old white male with a stage IIIb non-small cell lung cancer, squamous cell carcinoma diagnosed in September 2023 status post a course of concurrent chemoradiation with weekly carboplatin and paclitaxel and currently undergoing consolidation treatment with immunotherapy with Imfinzi every 4 weeks status post 3 cycles. The patient has been tolerating this treatment fairly well with no concerning adverse effect except for mild fatigue. He had repeat CT scan of the chest performed recently.  I personally and independently reviewed the scan images and discussed the results with the patient today. His scan showed no concerning findings for disease progression. I recommended for him to continue his current treatment with immunotherapy and he will proceed with cycle #4 today. The patient was advised to call immediately if he has any other concerning symptoms in the interval. The total time spent in the appointment was 30 minutes. Disclaimer: This note was dictated with voice recognition software. Similar sounding words can inadvertently be transcribed and may be missed upon review. Lajuana Matte, MD

## 2022-07-14 ENCOUNTER — Inpatient Hospital Stay: Payer: Medicare Other

## 2022-07-14 ENCOUNTER — Inpatient Hospital Stay (HOSPITAL_BASED_OUTPATIENT_CLINIC_OR_DEPARTMENT_OTHER): Payer: Medicare Other | Admitting: Physician Assistant

## 2022-07-14 ENCOUNTER — Inpatient Hospital Stay: Payer: Medicare Other | Attending: Internal Medicine

## 2022-07-14 VITALS — BP 139/85 | HR 58 | Resp 17

## 2022-07-14 VITALS — BP 134/76 | HR 71 | Temp 98.4°F | Resp 18 | Wt 147.2 lb

## 2022-07-14 DIAGNOSIS — Z5112 Encounter for antineoplastic immunotherapy: Secondary | ICD-10-CM | POA: Insufficient documentation

## 2022-07-14 DIAGNOSIS — C3412 Malignant neoplasm of upper lobe, left bronchus or lung: Secondary | ICD-10-CM

## 2022-07-14 DIAGNOSIS — C7951 Secondary malignant neoplasm of bone: Secondary | ICD-10-CM | POA: Diagnosis not present

## 2022-07-14 DIAGNOSIS — C3432 Malignant neoplasm of lower lobe, left bronchus or lung: Secondary | ICD-10-CM | POA: Diagnosis not present

## 2022-07-14 LAB — CMP (CANCER CENTER ONLY)
ALT: <5 U/L (ref 0–44)
AST: 11 U/L — ABNORMAL LOW (ref 15–41)
Albumin: 3.7 g/dL (ref 3.5–5.0)
Alkaline Phosphatase: 70 U/L (ref 38–126)
Anion gap: 6 (ref 5–15)
BUN: 16 mg/dL (ref 8–23)
CO2: 23 mmol/L (ref 22–32)
Calcium: 8.8 mg/dL — ABNORMAL LOW (ref 8.9–10.3)
Chloride: 110 mmol/L (ref 98–111)
Creatinine: 0.83 mg/dL (ref 0.61–1.24)
GFR, Estimated: >60 mL/min (ref >60)
Glucose, Bld: 125 mg/dL — ABNORMAL HIGH (ref 70–99)
Potassium: 4.1 mmol/L (ref 3.5–5.1)
Sodium: 139 mmol/L (ref 135–145)
Total Bilirubin: 0.3 mg/dL (ref 0.3–1.2)
Total Protein: 7.1 g/dL (ref 6.5–8.1)

## 2022-07-14 LAB — CBC WITH DIFFERENTIAL (CANCER CENTER ONLY)
Abs Immature Granulocytes: 0.02 10*3/uL (ref 0.00–0.07)
Basophils Absolute: 0.1 10*3/uL (ref 0.0–0.1)
Basophils Relative: 1 %
Eosinophils Absolute: 0.4 10*3/uL (ref 0.0–0.5)
Eosinophils Relative: 5 %
HCT: 33.8 % — ABNORMAL LOW (ref 39.0–52.0)
Hemoglobin: 11.8 g/dL — ABNORMAL LOW (ref 13.0–17.0)
Immature Granulocytes: 0 %
Lymphocytes Relative: 21 %
Lymphs Abs: 1.6 10*3/uL (ref 0.7–4.0)
MCH: 32.3 pg (ref 26.0–34.0)
MCHC: 34.9 g/dL (ref 30.0–36.0)
MCV: 92.6 fL (ref 80.0–100.0)
Monocytes Absolute: 0.8 10*3/uL (ref 0.1–1.0)
Monocytes Relative: 11 %
Neutro Abs: 4.5 10*3/uL (ref 1.7–7.7)
Neutrophils Relative %: 62 %
Platelet Count: 300 10*3/uL (ref 150–400)
RBC: 3.65 MIL/uL — ABNORMAL LOW (ref 4.22–5.81)
RDW: 15 % (ref 11.5–15.5)
WBC Count: 7.3 10*3/uL (ref 4.0–10.5)
nRBC: 0 % (ref 0.0–0.2)

## 2022-07-14 MED ORDER — SODIUM CHLORIDE 0.9 % IV SOLN: Freq: Once | INTRAVENOUS | Status: AC

## 2022-07-14 MED ORDER — SODIUM CHLORIDE 0.9 % IV SOLN
1500.0000 mg | Freq: Once | INTRAVENOUS | Status: AC
  Administered 2022-07-14: 1500 mg via INTRAVENOUS
  Filled 2022-07-14: qty 30

## 2022-07-14 NOTE — Patient Instructions (Signed)
[facility]   Discharge Instructions: Thank you for choosing [facility] to provide your oncology and hematology care.   If you have a lab appointment with the Cancer Center, please go directly to the Cancer Center and check in at the registration area.   Wear comfortable clothing and clothing appropriate for easy access to any Portacath or PICC line.   We strive to give you quality time with your provider. You may need to reschedule your appointment if you arrive late (15 or more minutes).  Arriving late affects you and other patients whose appointments are after yours.  Also, if you miss three or more appointments without notifying the office, you may be dismissed from the clinic at the provider's discretion.      For prescription refill requests, have your pharmacy contact our office and allow 72 hours for refills to be completed.    Today you received the following chemotherapy and/or immunotherapy agents: Durvalumab (Imfinzi)      To help prevent nausea and vomiting after your treatment, we encourage you to take your nausea medication as directed.  BELOW ARE SYMPTOMS THAT SHOULD BE REPORTED IMMEDIATELY: *FEVER GREATER THAN 100.4 F (38 C) OR HIGHER *CHILLS OR SWEATING *NAUSEA AND VOMITING THAT IS NOT CONTROLLED WITH YOUR NAUSEA MEDICATION *UNUSUAL SHORTNESS OF BREATH *UNUSUAL BRUISING OR BLEEDING *URINARY PROBLEMS (pain or burning when urinating, or frequent urination) *BOWEL PROBLEMS (unusual diarrhea, constipation, pain near the anus) TENDERNESS IN MOUTH AND THROAT WITH OR WITHOUT PRESENCE OF ULCERS (sore throat, sores in mouth, or a toothache) UNUSUAL RASH, SWELLING OR PAIN  UNUSUAL VAGINAL DISCHARGE OR ITCHING   Items with * indicate a potential emergency and should be followed up as soon as possible or go to the Emergency Department if any problems should occur.  Please show the CHEMOTHERAPY ALERT CARD or IMMUNOTHERAPY ALERT  CARD at check-in to the Emergency Department and triage nurse.  Should you have questions after your visit or need to cancel or reschedule your appointment, please contact [facility]  Dept: [phone]  and follow the prompts.  Office hours are 8:00 a.m. to 4:30 p.m. Monday - Friday. Please note that voicemails left after 4:00 p.m. may not be returned until the following business day.  We are closed weekends and major holidays. You have access to a nurse at all times for urgent questions. Please call the main number to the clinic Dept: [phone] and follow the prompts.   For any non-urgent questions, you may also contact your provider using MyChart. We now offer e-Visits for anyone 18 and older to request care online for non-urgent symptoms. For details visit [email or web address].   Also download the MyChart app! Go to the app store, search "MyChart", open the app, select [facility], and log in with your MyChart username and password.   

## 2022-07-14 NOTE — Progress Notes (Signed)
Patient seen by Cassie Heilingoetter, PA-C  Vitals are within treatment parameters.  Labs reviewed: and are within treatment parameters.  Per physician team, patient is ready for treatment and there are NO modifications to the treatment plan.  

## 2022-07-16 ENCOUNTER — Other Ambulatory Visit: Payer: Self-pay

## 2022-07-20 ENCOUNTER — Ambulatory Visit: Payer: Medicare Other | Admitting: Internal Medicine

## 2022-07-20 ENCOUNTER — Ambulatory Visit: Payer: Medicare Other

## 2022-07-20 ENCOUNTER — Other Ambulatory Visit: Payer: Medicare Other

## 2022-07-21 ENCOUNTER — Other Ambulatory Visit: Payer: Medicare Other

## 2022-07-21 ENCOUNTER — Ambulatory Visit: Payer: Medicare Other

## 2022-07-21 ENCOUNTER — Other Ambulatory Visit: Payer: Self-pay

## 2022-07-21 ENCOUNTER — Ambulatory Visit: Payer: Medicare Other | Admitting: Internal Medicine

## 2022-08-04 ENCOUNTER — Ambulatory Visit: Payer: Medicare Other

## 2022-08-04 ENCOUNTER — Other Ambulatory Visit: Payer: Medicare Other

## 2022-08-04 ENCOUNTER — Ambulatory Visit: Payer: Medicare Other | Admitting: Physician Assistant

## 2022-08-07 ENCOUNTER — Other Ambulatory Visit: Payer: Self-pay

## 2022-08-11 ENCOUNTER — Inpatient Hospital Stay: Payer: Medicare Other

## 2022-08-11 ENCOUNTER — Inpatient Hospital Stay (HOSPITAL_BASED_OUTPATIENT_CLINIC_OR_DEPARTMENT_OTHER): Payer: Medicare Other

## 2022-08-11 ENCOUNTER — Inpatient Hospital Stay (HOSPITAL_BASED_OUTPATIENT_CLINIC_OR_DEPARTMENT_OTHER): Payer: Medicare Other | Admitting: Internal Medicine

## 2022-08-11 ENCOUNTER — Encounter: Payer: Self-pay | Admitting: Medical Oncology

## 2022-08-11 ENCOUNTER — Encounter: Payer: Self-pay | Admitting: Internal Medicine

## 2022-08-11 VITALS — BP 111/75 | HR 69 | Temp 98.3°F | Resp 17 | Ht 71.0 in | Wt 145.0 lb

## 2022-08-11 DIAGNOSIS — C3412 Malignant neoplasm of upper lobe, left bronchus or lung: Secondary | ICD-10-CM

## 2022-08-11 DIAGNOSIS — C3432 Malignant neoplasm of lower lobe, left bronchus or lung: Secondary | ICD-10-CM | POA: Diagnosis not present

## 2022-08-11 DIAGNOSIS — Z5112 Encounter for antineoplastic immunotherapy: Secondary | ICD-10-CM | POA: Diagnosis not present

## 2022-08-11 DIAGNOSIS — C7951 Secondary malignant neoplasm of bone: Secondary | ICD-10-CM | POA: Diagnosis not present

## 2022-08-11 LAB — CMP (CANCER CENTER ONLY)
ALT: <5 U/L (ref 0–44)
AST: 11 U/L — ABNORMAL LOW (ref 15–41)
Albumin: 3.6 g/dL (ref 3.5–5.0)
Alkaline Phosphatase: 77 U/L (ref 38–126)
Anion gap: 5 (ref 5–15)
BUN: 14 mg/dL (ref 8–23)
CO2: 24 mmol/L (ref 22–32)
Calcium: 8.7 mg/dL — ABNORMAL LOW (ref 8.9–10.3)
Chloride: 109 mmol/L (ref 98–111)
Creatinine: 0.9 mg/dL (ref 0.61–1.24)
GFR, Estimated: >60 mL/min (ref >60)
Glucose, Bld: 128 mg/dL — ABNORMAL HIGH (ref 70–99)
Potassium: 4.1 mmol/L (ref 3.5–5.1)
Sodium: 138 mmol/L (ref 135–145)
Total Bilirubin: 0.4 mg/dL (ref 0.3–1.2)
Total Protein: 7.4 g/dL (ref 6.5–8.1)

## 2022-08-11 LAB — CBC WITH DIFFERENTIAL (CANCER CENTER ONLY)
Abs Immature Granulocytes: 0.02 10*3/uL (ref 0.00–0.07)
Basophils Absolute: 0.1 10*3/uL (ref 0.0–0.1)
Basophils Relative: 1 %
Eosinophils Absolute: 0.2 10*3/uL (ref 0.0–0.5)
Eosinophils Relative: 3 %
HCT: 36.6 % — ABNORMAL LOW (ref 39.0–52.0)
Hemoglobin: 12.5 g/dL — ABNORMAL LOW (ref 13.0–17.0)
Immature Granulocytes: 0 %
Lymphocytes Relative: 28 %
Lymphs Abs: 1.8 10*3/uL (ref 0.7–4.0)
MCH: 31.5 pg (ref 26.0–34.0)
MCHC: 34.2 g/dL (ref 30.0–36.0)
MCV: 92.2 fL (ref 80.0–100.0)
Monocytes Absolute: 0.7 10*3/uL (ref 0.1–1.0)
Monocytes Relative: 11 %
Neutro Abs: 3.7 10*3/uL (ref 1.7–7.7)
Neutrophils Relative %: 57 %
Platelet Count: 312 10*3/uL (ref 150–400)
RBC: 3.97 MIL/uL — ABNORMAL LOW (ref 4.22–5.81)
RDW: 15 % (ref 11.5–15.5)
WBC Count: 6.5 10*3/uL (ref 4.0–10.5)
nRBC: 0 % (ref 0.0–0.2)

## 2022-08-11 MED ORDER — SODIUM CHLORIDE 0.9 % IV SOLN
1500.0000 mg | Freq: Once | INTRAVENOUS | Status: AC
  Administered 2022-08-11: 1500 mg via INTRAVENOUS
  Filled 2022-08-11: qty 30

## 2022-08-11 MED ORDER — SODIUM CHLORIDE 0.9 % IV SOLN: Freq: Once | INTRAVENOUS | Status: AC

## 2022-08-11 NOTE — Progress Notes (Signed)
[facility] [phone]   [phone]  OFFICE PROGRESS NOTE  [name], PA-C [address] [place] [address]  DIAGNOSIS:  Stage IIIB (T3, N2, M0) non-small cell lung cancer, squamous cell carcinoma presented with large left lower lobe lung mass in addition subcarinal and AP window nodal metastases. Diagnosed in September 2023.      PRIOR THERAPY: Concurrent chemo/radiation with carboplatin for an AUC of 2 and paclitaxel 45 mg/m2 weekly. First dose expected 01/10/22. Status post 7 cycles.  Last dose was giving February 15, 2022 with partial response.   CURRENT THERAPY: Consolidation treatment with immunotherapy with Imfinzi 1500 Mg IV every 4 weeks.  First dose March 31, 2022.  Status post 4 cycles.  INTERVAL HISTORY: Cameron Roman 72 y.o. male turns to the clinic today for follow-up visit.  The patient is feeling fine today with no concerning complaints except for the shortness of breath with exertion.  He has no current chest pain, cough or hemoptysis.  He has no nausea, vomiting, diarrhea or constipation.  He has no headache or visual changes.  He lost 2 pounds since his last visit.  He is here today for evaluation before starting cycle #5 of his treatment.  MEDICAL HISTORY: Past Medical History:  Diagnosis Date   Anxiety    Arthritis    Atrial fibrillation (HCC)    Chronic back pain    buldging disc   Chronic neck pain    ruptured disc   Cough    hx smoking   Diverticulosis    Esophageal stricture    GERD (gastroesophageal reflux disease)    takes Prilosec daily   H/O hiatal hernia    Hearing loss    Hepatitis-C 2017   treated and cured per patient   Hiatal hernia    History of colon polyps    History of radiation therapy    Left Lung- 01/10/22-02/18/22- Dr. Antony Blackbird   Insomnia    takes Elavil nightly   Lipoma of abdominal wall 02/11/2011   Lung cancer (HCC)    Stroke (HCC)    x 2   Type 2 diabetes mellitus (HCC)  07/22/2020   patient denies DM, per MD note pt is pre-diabetic    ALLERGIES:  is allergic to percocet [oxycodone-acetaminophen].  MEDICATIONS:  Current Outpatient Medications  Medication Sig Dispense Refill   durvalumab 1,500 mg in sodium chloride 0.9 % 100 mL Inject 1,500 mg into the vein every 28 (twenty-eight) days.     ELIQUIS 5 MG TABS tablet TAKE ONE TABLET BY MOUTH TWICE DAILY 60 tablet 5   fluticasone (FLONASE) 50 MCG/ACT nasal spray Place 1 spray into the nose in the morning.     gabapentin (NEURONTIN) 300 MG capsule Take 1 capsule (300 mg total) by mouth 3 (three) times daily. Take one capsule twice to three times daily (Patient taking differently: Take 300 mg by mouth 2 (two) times daily.) 270 capsule 3   HYDROcodone-acetaminophen (HYCET) 7.5-325 mg/15 ml solution Take 10 mLs by mouth 4 (four) times daily as needed for moderate pain. 120 mL 0   Omega-3 Fatty Acids (FISH OIL) 1000 MG CAPS Take 1,000 mg by mouth in the morning and at bedtime.     omeprazole (PRILOSEC) 20 MG capsule Take 1 capsule (20 mg total) by mouth daily. 90 capsule 3   pantoprazole (PROTONIX) 40 MG tablet Take 1 tablet (40 mg total) by mouth daily. 90 tablet 3  pravastatin (PRAVACHOL) 20 MG tablet Take 1 tablet (20 mg total) by mouth daily. Take one tablet once daily (Patient taking differently: Take 20 mg by mouth at bedtime. Take one tablet once daily) 90 tablet 3   prochlorperazine (COMPAZINE) 10 MG tablet Take 1 tablet (10 mg total) by mouth every 6 (six) hours as needed for nausea or vomiting. (Patient not taking: Reported on 05/26/2022) 30 tablet 1   sucralfate (CARAFATE) 1 g tablet Take 1 g by mouth 4 (four) times daily.     tamsulosin (FLOMAX) 0.4 MG CAPS capsule Take 1 capsule (0.4 mg total) by mouth daily. (Patient not taking: Reported on 07/05/2022) 90 capsule 3   Current Facility-Administered Medications  Medication Dose Route Frequency Provider Last Rate Last Admin   methylPREDNISolone acetate  (DEPO-MEDROL) injection 80 mg  80 mg Intramuscular Once Dettinger, Elige Radon, MD        SURGICAL HISTORY:  Past Surgical History:  Procedure Laterality Date   ANTERIOR CERVICAL DECOMP/DISCECTOMY FUSION  04/12/2011   Procedure: ANTERIOR CERVICAL DECOMPRESSION/DISCECTOMY FUSION 2 LEVELS;  Surgeon: Dorian Heckle, MD;  Location: [facility];  Service: Neurosurgery;  Laterality: N/A;  exploration of Cervical four - seven  fusion with redo Cervical six- seven, cervical three- four anterior cervical decompression with fusion interbody prothesis plating and bonegraft and C34 anterior cervical decompression with inte   APPENDECTOMY     at age 4   BRONCHIAL BIOPSY  11/30/2021   Procedure: BRONCHIAL BIOPSIES;  Surgeon: Josephine Igo, DO;  Location: [facility];  Service: Pulmonary;;   BRONCHIAL BRUSHINGS  11/30/2021   Procedure: BRONCHIAL BRUSHINGS;  Surgeon: Josephine Igo, DO;  Location: [facility];  Service: Pulmonary;;   CARDIAC CATHETERIZATION  06/03/2004   COLONOSCOPY     EP IMPLANTABLE DEVICE N/A 09/14/2015   Procedure: Loop Recorder Insertion;  Surgeon: Marinus Maw, MD;  Location: [facility];  Service: Cardiovascular;  Laterality: N/A;   FINE NEEDLE ASPIRATION  11/30/2021   Procedure: FINE NEEDLE ASPIRATION (FNA) LINEAR;  Surgeon: Josephine Igo, DO;  Location: [facility];  Service: Pulmonary;;   HAND SURGERY Right 03/15/2003   right x 3   INNER EAR SURGERY Bilateral    bil;titanium in both ears   lower back surgery  03/15/2007   had plates and screws inserted   NECK SURGERY  03/15/2007   had insertion of  plates and screws   TEE WITHOUT CARDIOVERSION N/A 09/14/2015   Procedure: TRANSESOPHAGEAL ECHOCARDIOGRAM (TEE);  Surgeon: Vesta Mixer, MD;  Location: [facility] ENDOSCOPY;  Service: Cardiovascular;  Laterality: N/A;   TRANSFORAMINAL LUMBAR INTERBODY FUSION (TLIF) WITH PEDICLE SCREW FIXATION 1 LEVEL Left 09/27/2019   Procedure: Left Lumbar 5 Sacral 1 Transforaminal  lumbar interbody fusion with exploration/removal of adjacent level hardware;  Surgeon: Maeola Harman, MD;  Location: [facility];  Service: Neurosurgery;  Laterality: Left;  3C/RM 19   VIDEO BRONCHOSCOPY WITH ENDOBRONCHIAL ULTRASOUND N/A 11/30/2021   Procedure: VIDEO BRONCHOSCOPY WITH ENDOBRONCHIAL ULTRASOUND;  Surgeon: Josephine Igo, DO;  [facility]: [facility];  Service: Pulmonary;  Laterality: N/A;    REVIEW OF SYSTEMS:  A comprehensive review of systems was negative except for: Respiratory: positive for dyspnea on exertion   PHYSICAL EXAMINATION: General appearance: alert, cooperative, and no distress Head: Normocephalic, without obvious abnormality, atraumatic Neck: no adenopathy, no JVD, supple, symmetrical, trachea midline, and thyroid not enlarged, symmetric, no tenderness/mass/nodules Lymph nodes: Cervical, supraclavicular, and axillary nodes normal. Resp: clear to auscultation bilaterally Back: symmetric, no curvature.  ROM normal. No CVA tenderness. Cardio: regular rate and rhythm, S1, S2 normal, no murmur, click, rub or gallop GI: soft, non-tender; bowel sounds normal; no masses,  no organomegaly Extremities: extremities normal, atraumatic, no cyanosis or edema  ECOG PERFORMANCE STATUS: 1 - Symptomatic but completely ambulatory  Blood pressure 111/75, pulse 69, temperature 98.3 F (36.8 C), temperature source Oral, resp. rate 17, height 5\' 11"  (1.803 m), weight 145 lb (65.8 kg), SpO2 99 %.  LABORATORY DATA: Lab Results  Component Value Date   WBC 6.5 08/11/2022   HGB 12.5 (L) 08/11/2022   HCT 36.6 (L) 08/11/2022   MCV 92.2 08/11/2022   PLT 312 08/11/2022      Chemistry      Component Value Date/Time   NA 139 07/14/2022 0830   NA 140 08/30/2021 1659   K 4.1 07/14/2022 0830   CL 110 07/14/2022 0830   CO2 23 07/14/2022 0830   BUN 16 07/14/2022 0830   BUN 17 08/30/2021 1659   CREATININE 0.83 07/14/2022 0830   CREATININE 0.80 08/30/2012 0955      Component Value  Date/Time   CALCIUM 8.8 (L) 07/14/2022 0830   ALKPHOS 70 07/14/2022 0830   AST 11 (L) 07/14/2022 0830   ALT <5 07/14/2022 0830   BILITOT 0.3 07/14/2022 0830       RADIOGRAPHIC STUDIES: No results found.  ASSESSMENT AND PLAN:  This is a very pleasant 72 years old white male diagnosed with a stage IIIb (T3, N2, M0) non-small cell lung cancer, squamous cell carcinoma presented with large left lower lobe lung mass in addition to subcarinal and AP window lymphadenopathy diagnosed in September 2023.  The patient underwent a course of concurrent chemoradiation with weekly carboplatin and paclitaxel status post 7 cycles of the chemotherapy.  He tolerated this treatment well with no concerning adverse effects except for the radiation-induced esophagitis and odynophagia.  He is recovering well from his treatment. He is currently undergoing a course of consolidation treatment with immunotherapy with Imfinzi 1500 Mg IV every 4 weeks status post 4 cycle.  The patient has been tolerating this treatment well with no concerning adverse effects. I recommended for him to proceed with cycle #5 today as planned. I will see him back for follow-up visit in 4 weeks for evaluation before the next cycle of his treatment. The patient was advised to call immediately if he has any other concerning symptoms in the interval. The patient voices understanding of current disease status and treatment options and is in agreement with the current care plan.  All questions were answered. The patient knows to call the clinic with any problems, questions or concerns. We can certainly see the patient much sooner if necessary.  The total time spent in the appointment was 20 minutes.  Disclaimer: This note was dictated with voice recognition software. Similar sounding words can inadvertently be transcribed and may not be corrected upon review.

## 2022-08-11 NOTE — Progress Notes (Signed)
Patient seen by Dr. Mohamed  Vitals are within treatment parameters.   Labs reviewed: and are within treatment parameters.  Per physician team, patient is ready for treatment and there are NO modifications to the treatment plan.  Durvalumab 

## 2022-08-18 ENCOUNTER — Ambulatory Visit: Payer: Medicare Other | Admitting: Physician Assistant

## 2022-08-18 ENCOUNTER — Ambulatory Visit: Payer: Medicare Other

## 2022-08-18 ENCOUNTER — Other Ambulatory Visit: Payer: Medicare Other

## 2022-08-31 ENCOUNTER — Ambulatory Visit: Payer: Medicare Other | Admitting: Internal Medicine

## 2022-08-31 ENCOUNTER — Ambulatory Visit: Payer: Medicare Other

## 2022-08-31 ENCOUNTER — Other Ambulatory Visit: Payer: Medicare Other

## 2022-09-01 ENCOUNTER — Encounter: Payer: Self-pay | Admitting: Internal Medicine

## 2022-09-01 ENCOUNTER — Encounter: Payer: Self-pay | Admitting: Physician Assistant

## 2022-09-02 ENCOUNTER — Telehealth: Payer: Self-pay | Admitting: Internal Medicine

## 2022-09-02 NOTE — Telephone Encounter (Signed)
Called patient regarding June and July appointments, patient is notified. 

## 2022-09-07 ENCOUNTER — Other Ambulatory Visit: Payer: Self-pay

## 2022-09-08 ENCOUNTER — Ambulatory Visit: Payer: Medicare Other

## 2022-09-08 ENCOUNTER — Inpatient Hospital Stay: Payer: Medicare Other

## 2022-09-08 ENCOUNTER — Inpatient Hospital Stay (HOSPITAL_BASED_OUTPATIENT_CLINIC_OR_DEPARTMENT_OTHER): Payer: Medicare Other | Admitting: Internal Medicine

## 2022-09-08 ENCOUNTER — Other Ambulatory Visit: Payer: Self-pay

## 2022-09-08 ENCOUNTER — Other Ambulatory Visit: Payer: Medicare Other

## 2022-09-08 ENCOUNTER — Ambulatory Visit: Payer: Medicare Other | Admitting: Physician Assistant

## 2022-09-08 ENCOUNTER — Inpatient Hospital Stay: Payer: Medicare Other | Attending: Internal Medicine

## 2022-09-08 VITALS — BP 120/74 | HR 72 | Temp 98.2°F | Resp 16 | Ht 71.0 in | Wt 139.0 lb

## 2022-09-08 DIAGNOSIS — Z79620 Long term (current) use of immunosuppressive biologic: Secondary | ICD-10-CM | POA: Insufficient documentation

## 2022-09-08 DIAGNOSIS — C3412 Malignant neoplasm of upper lobe, left bronchus or lung: Secondary | ICD-10-CM

## 2022-09-08 DIAGNOSIS — C3490 Malignant neoplasm of unspecified part of unspecified bronchus or lung: Secondary | ICD-10-CM

## 2022-09-08 DIAGNOSIS — C7951 Secondary malignant neoplasm of bone: Secondary | ICD-10-CM | POA: Diagnosis not present

## 2022-09-08 DIAGNOSIS — C3432 Malignant neoplasm of lower lobe, left bronchus or lung: Secondary | ICD-10-CM | POA: Insufficient documentation

## 2022-09-08 DIAGNOSIS — Z5112 Encounter for antineoplastic immunotherapy: Secondary | ICD-10-CM | POA: Diagnosis not present

## 2022-09-08 LAB — CBC WITH DIFFERENTIAL (CANCER CENTER ONLY)
Abs Immature Granulocytes: 0.02 10*3/uL (ref 0.00–0.07)
Basophils Absolute: 0.1 10*3/uL (ref 0.0–0.1)
Basophils Relative: 1 %
Eosinophils Absolute: 0.2 10*3/uL (ref 0.0–0.5)
Eosinophils Relative: 2 %
HCT: 35.3 % — ABNORMAL LOW (ref 39.0–52.0)
Hemoglobin: 11.9 g/dL — ABNORMAL LOW (ref 13.0–17.0)
Immature Granulocytes: 0 %
Lymphocytes Relative: 21 %
Lymphs Abs: 1.5 10*3/uL (ref 0.7–4.0)
MCH: 31.8 pg (ref 26.0–34.0)
MCHC: 33.7 g/dL (ref 30.0–36.0)
MCV: 94.4 fL (ref 80.0–100.0)
Monocytes Absolute: 0.7 10*3/uL (ref 0.1–1.0)
Monocytes Relative: 9 %
Neutro Abs: 5.1 10*3/uL (ref 1.7–7.7)
Neutrophils Relative %: 67 %
Platelet Count: 333 10*3/uL (ref 150–400)
RBC: 3.74 MIL/uL — ABNORMAL LOW (ref 4.22–5.81)
RDW: 15.2 % (ref 11.5–15.5)
WBC Count: 7.5 10*3/uL (ref 4.0–10.5)
nRBC: 0 % (ref 0.0–0.2)

## 2022-09-08 LAB — CMP (CANCER CENTER ONLY)
ALT: 5 U/L (ref 0–44)
AST: 11 U/L — ABNORMAL LOW (ref 15–41)
Albumin: 3.5 g/dL (ref 3.5–5.0)
Alkaline Phosphatase: 79 U/L (ref 38–126)
Anion gap: 7 (ref 5–15)
BUN: 14 mg/dL (ref 8–23)
CO2: 24 mmol/L (ref 22–32)
Calcium: 9.1 mg/dL (ref 8.9–10.3)
Chloride: 107 mmol/L (ref 98–111)
Creatinine: 0.78 mg/dL (ref 0.61–1.24)
GFR, Estimated: >60 mL/min (ref >60)
Glucose, Bld: 91 mg/dL (ref 70–99)
Potassium: 4.4 mmol/L (ref 3.5–5.1)
Sodium: 138 mmol/L (ref 135–145)
Total Bilirubin: 0.5 mg/dL (ref 0.3–1.2)
Total Protein: 7.4 g/dL (ref 6.5–8.1)

## 2022-09-08 LAB — TSH: TSH: 1.691 u[IU]/mL (ref 0.350–4.500)

## 2022-09-08 MED ORDER — SODIUM CHLORIDE 0.9 % IV SOLN
1500.0000 mg | Freq: Once | INTRAVENOUS | Status: AC
  Administered 2022-09-08: 1500 mg via INTRAVENOUS
  Filled 2022-09-08: qty 30

## 2022-09-08 MED ORDER — SODIUM CHLORIDE 0.9 % IV SOLN: Freq: Once | INTRAVENOUS | Status: AC

## 2022-09-08 NOTE — Patient Instructions (Signed)
[facility]  Discharge Instructions: Thank you for choosing [facility] to provide your oncology and hematology care.   If you have a lab appointment with the Cancer Center, please go directly to the Cancer Center and check in at the registration area.   Wear comfortable clothing and clothing appropriate for easy access to any Portacath or PICC line.   We strive to give you quality time with your provider. You may need to reschedule your appointment if you arrive late (15 or more minutes).  Arriving late affects you and other patients whose appointments are after yours.  Also, if you miss three or more appointments without notifying the office, you may be dismissed from the clinic at the provider's discretion.      For prescription refill requests, have your pharmacy contact our office and allow 72 hours for refills to be completed.    Today you received the following chemotherapy and/or immunotherapy agents: durvalumab        To help prevent nausea and vomiting after your treatment, we encourage you to take your nausea medication as directed.  BELOW ARE SYMPTOMS THAT SHOULD BE REPORTED IMMEDIATELY: *FEVER GREATER THAN 100.4 F (38 C) OR HIGHER *CHILLS OR SWEATING *NAUSEA AND VOMITING THAT IS NOT CONTROLLED WITH YOUR NAUSEA MEDICATION *UNUSUAL SHORTNESS OF BREATH *UNUSUAL BRUISING OR BLEEDING *URINARY PROBLEMS (pain or burning when urinating, or frequent urination) *BOWEL PROBLEMS (unusual diarrhea, constipation, pain near the anus) TENDERNESS IN MOUTH AND THROAT WITH OR WITHOUT PRESENCE OF ULCERS (sore throat, sores in mouth, or a toothache) UNUSUAL RASH, SWELLING OR PAIN  UNUSUAL VAGINAL DISCHARGE OR ITCHING   Items with * indicate a potential emergency and should be followed up as soon as possible or go to the Emergency Department if any problems should occur.  Please show the CHEMOTHERAPY ALERT CARD or IMMUNOTHERAPY ALERT CARD at  check-in to the Emergency Department and triage nurse.  Should you have questions after your visit or need to cancel or reschedule your appointment, please contact [facility]  Dept: [phone]  and follow the prompts.  Office hours are 8:00 a.m. to 4:30 p.m. Monday - Friday. Please note that voicemails left after 4:00 p.m. may not be returned until the following business day.  We are closed weekends and major holidays. You have access to a nurse at all times for urgent questions. Please call the main number to the clinic Dept: [phone] and follow the prompts.   For any non-urgent questions, you may also contact your provider using MyChart. We now offer e-Visits for anyone 18 and older to request care online for non-urgent symptoms. For details visit [email or web address].   Also download the MyChart app! Go to the app store, search "MyChart", open the app, select [place], and log in with your MyChart username and password.   

## 2022-09-08 NOTE — Progress Notes (Signed)
[facility] [phone]   Fax:([phone]) [phone]  OFFICE PROGRESS NOTE  [name], PA-C [address] [place] [address]  DIAGNOSIS:  Stage IIIB (T3, N2, M0) non-small cell lung cancer, squamous cell carcinoma presented with large left lower lobe lung mass in addition subcarinal and AP window nodal metastases. Diagnosed in September 2023.      PRIOR THERAPY: Concurrent chemo/radiation with carboplatin for an AUC of 2 and paclitaxel 45 mg/m2 weekly. First dose expected 01/10/22. Status post 7 cycles.  Last dose was giving February 15, 2022 with partial response.   CURRENT THERAPY: Consolidation treatment with immunotherapy with Imfinzi 1500 Mg IV every 4 weeks.  First dose March 31, 2022.  Status post 5 cycles.  INTERVAL HISTORY: Cameron Roman 72 y.o. male returns to the clinic today for follow-up visit.  The patient is feeling fine today with no concerning complaints except for intermittent substernal chest pain but no significant shortness of breath and has mild cough with no hemoptysis.  He has no nausea, vomiting, diarrhea or constipation.  He has no headache or visual changes.  He denied having any fever or chills.  He has been tolerating his treatment with immunotherapy fairly well.  He is here today for evaluation before starting cycle #6.  MEDICAL HISTORY: Past Medical History:  Diagnosis Date   Anxiety    Arthritis    Atrial fibrillation (HCC)    Chronic back pain    buldging disc   Chronic neck pain    ruptured disc   Cough    hx smoking   Diverticulosis    Esophageal stricture    GERD (gastroesophageal reflux disease)    takes Prilosec daily   H/O hiatal hernia    Hearing loss    Hepatitis-C 2017   treated and cured per patient   Hiatal hernia    History of colon polyps    History of radiation therapy    Left Lung- 01/10/22-02/18/22- Dr. Antony Blackbird   Insomnia    takes Elavil nightly   Lipoma of abdominal wall 02/11/2011    Lung cancer (HCC)    Stroke (HCC)    x 2   Type 2 diabetes mellitus (HCC) 07/22/2020   patient denies DM, per MD note pt is pre-diabetic    ALLERGIES:  is allergic to percocet [oxycodone-acetaminophen].  MEDICATIONS:  Current Outpatient Medications  Medication Sig Dispense Refill   HYDROcodone-acetaminophen (NORCO) 10-325 MG tablet Take 1 tablet by mouth 3 (three) times daily.     durvalumab 1,500 mg in sodium chloride 0.9 % 100 mL Inject 1,500 mg into the vein every 28 (twenty-eight) days.     ELIQUIS 5 MG TABS tablet TAKE ONE TABLET BY MOUTH TWICE DAILY 60 tablet 5   fluticasone (FLONASE) 50 MCG/ACT nasal spray Place 1 spray into the nose in the morning.     gabapentin (NEURONTIN) 300 MG capsule Take 1 capsule (300 mg total) by mouth 3 (three) times daily. Take one capsule twice to three times daily (Patient taking differently: Take 300 mg by mouth 2 (two) times daily.) 270 capsule 3   Omega-3 Fatty Acids (FISH OIL) 1000 MG CAPS Take 1,000 mg by mouth in the morning and at bedtime.     omeprazole (PRILOSEC) 20 MG capsule Take 1 capsule (20 mg total) by mouth daily. 90 capsule 3   pantoprazole (PROTONIX) 40 MG tablet Take 1 tablet (40 mg total) by mouth daily. 90 tablet 3  pravastatin (PRAVACHOL) 20 MG tablet Take 1 tablet (20 mg total) by mouth daily. Take one tablet once daily (Patient taking differently: Take 20 mg by mouth at bedtime. Take one tablet once daily) 90 tablet 3   prochlorperazine (COMPAZINE) 10 MG tablet Take 1 tablet (10 mg total) by mouth every 6 (six) hours as needed for nausea or vomiting. (Patient not taking: Reported on 05/26/2022) 30 tablet 1   sucralfate (CARAFATE) 1 g tablet Take 1 g by mouth 4 (four) times daily.     tamsulosin (FLOMAX) 0.4 MG CAPS capsule Take 1 capsule (0.4 mg total) by mouth daily. (Patient not taking: Reported on 07/05/2022) 90 capsule 3   Current Facility-Administered Medications  Medication Dose Route Frequency Provider Last Rate Last Admin    methylPREDNISolone acetate (DEPO-MEDROL) injection 80 mg  80 mg Intramuscular Once Dettinger, Elige Radon, MD        SURGICAL HISTORY:  Past Surgical History:  Procedure Laterality Date   ANTERIOR CERVICAL DECOMP/DISCECTOMY FUSION  04/12/2011   Procedure: ANTERIOR CERVICAL DECOMPRESSION/DISCECTOMY FUSION 2 LEVELS;  Surgeon: Dorian Heckle, MD;  Location: [facility];  Service: Neurosurgery;  Laterality: N/A;  exploration of Cervical four - seven  fusion with redo Cervical six- seven, cervical three- four anterior cervical decompression with fusion interbody prothesis plating and bonegraft and C34 anterior cervical decompression with inte   APPENDECTOMY     at age 45   BRONCHIAL BIOPSY  11/30/2021   Procedure: BRONCHIAL BIOPSIES;  Surgeon: Josephine Igo, DO;  Location: [facility];  Service: Pulmonary;;   BRONCHIAL BRUSHINGS  11/30/2021   Procedure: BRONCHIAL BRUSHINGS;  Surgeon: Josephine Igo, DO;  Location: [facility];  Service: Pulmonary;;   CARDIAC CATHETERIZATION  06/03/2004   COLONOSCOPY     EP IMPLANTABLE DEVICE N/A 09/14/2015   Procedure: Loop Recorder Insertion;  Surgeon: Marinus Maw, MD;  [facility];  Service: Cardiovascular;  Laterality: N/A;   FINE NEEDLE ASPIRATION  11/30/2021   Procedure: FINE NEEDLE ASPIRATION (FNA) LINEAR;  Surgeon: Josephine Igo, DO;  Location: [facility];  Service: Pulmonary;;   HAND SURGERY Right 03/15/2003   right x 3   INNER EAR SURGERY Bilateral    bil;titanium in both ears   lower back surgery  03/15/2007   had plates and screws inserted   NECK SURGERY  03/15/2007   had insertion of  plates and screws   TEE WITHOUT CARDIOVERSION N/A 09/14/2015   Procedure: TRANSESOPHAGEAL ECHOCARDIOGRAM (TEE);  Surgeon: Vesta Mixer, MD;  Location[facility] END[facility]COPY;  Service: Cardiovascular;  Laterality: N/A;   TRANSFORAMINAL LUMBAR INTERBODY FUSION (TLIF) WITH PEDICLE SCREW FIXATION 1 LEVEL Left 09/27/2019   Procedure: Left  Lumbar 5 Sacral 1 Transforaminal lumbar interbody fusion with exploration/removal of adjacent level hardware;  Surgeon: Maeola Harman, MD;  Location: [facility] OR;  Service: Neurosurgery;  Laterality: Left;  3C/RM 19   VIDEO BRONCHOSCOPY WITH ENDOBRONCHIAL ULTRASOUND N/A 11/30/2021   Procedure: VIDEO BRONCHOSCOPY WITH ENDOBRONCHIAL ULTRASOUND;  Surgeon: Josephine Igo, DO;  Location: [facility];  Service: Pulmonary;  Laterality: N/A;    REVIEW OF SYSTEMS:  A comprehensive review of systems was negative except for: Respiratory: positive for cough and pleurisy/chest pain   PHYSICAL EXAMINATION: General appearance: alert, cooperative, and no distress Head: Normocephalic, without obvious abnormality, atraumatic Neck: no adenopathy, no JVD, supple, symmetrical, trachea midline, and thyroid not enlarged, symmetric, no tenderness/mass/nodules Lymph nodes: Cervical, supraclavicular, and axillary nodes normal. Resp: clear to auscultation bilaterally Back: symmetric, no  curvature. ROM normal. No CVA tenderness. Cardio: regular rate and rhythm, S1, S2 normal, no murmur, click, rub or gallop GI: soft, non-tender; bowel sounds normal; no masses,  no organomegaly Extremities: extremities normal, atraumatic, no cyanosis or edema  ECOG PERFORMANCE STATUS: 1 - Symptomatic but completely ambulatory  Blood pressure 120/74, pulse 72, temperature 98.2 F (36.8 C), temperature source Oral, resp. rate 16, height 5\' 11"  (1.803 m), weight 139 lb (63 kg), SpO2 98 %.  LABORATORY DATA: Lab Results  Component Value Date   WBC 7.5 09/08/2022   HGB 11.9 (L) 09/08/2022   HCT 35.3 (L) 09/08/2022   MCV 94.4 09/08/2022   PLT 333 09/08/2022      Chemistry      Component Value Date/Time   NA 138 09/08/2022 0902   NA 140 08/30/2021 1659   K 4.4 09/08/2022 0902   CL 107 09/08/2022 0902   CO2 24 09/08/2022 0902   BUN 14 09/08/2022 0902   BUN 17 08/30/2021 1659   CREATININE 0.78 09/08/2022 0902   CREATININE 0.80  08/30/2012 0955      Component Value Date/Time   CALCIUM 9.1 09/08/2022 0902   ALKPHOS 79 09/08/2022 0902   AST 11 (L) 09/08/2022 0902   ALT 5 09/08/2022 0902   BILITOT 0.5 09/08/2022 0902       RADIOGRAPHIC STUDIES: No results found.  ASSESSMENT AND PLAN:  This is a very pleasant 72 years old white male diagnosed with a stage IIIb (T3, N2, M0) non-small cell lung cancer, squamous cell carcinoma presented with large left lower lobe lung mass in addition to subcarinal and AP window lymphadenopathy diagnosed in September 2023.  The patient underwent a course of concurrent chemoradiation with weekly carboplatin and paclitaxel status post 7 cycles of the chemotherapy.  He tolerated this treatment well with no concerning adverse effects except for the radiation-induced esophagitis and odynophagia.  He is recovering well from his treatment. He is currently undergoing a course of consolidation treatment with immunotherapy with Imfinzi 1500 Mg IV every 4 weeks status post 5 cycle.  The patient has been tolerating this treatment well with no concerning adverse effects. I recommended for him to proceed with cycle #6 today as planned. I will see him back for follow-up visit in 4 weeks for evaluation with repeat CT scan of the chest for restaging of his disease. He was advised to call immediately if he has any other concerning symptoms in the interval. The patient voices understanding of current disease status and treatment options and is in agreement with the current care plan.  All questions were answered. The patient knows to call the clinic with any problems, questions or concerns. We can certainly see the patient much sooner if necessary.  The total time spent in the appointment was 20 minutes.  Disclaimer: This note was dictated with voice recognition software. Similar sounding words can inadvertently be transcribed and may not be corrected upon review.

## 2022-09-10 LAB — T4: T4, Total: 7.8 ug/dL (ref 4.5–12.0)

## 2022-10-02 ENCOUNTER — Ambulatory Visit (HOSPITAL_BASED_OUTPATIENT_CLINIC_OR_DEPARTMENT_OTHER)
Admission: RE | Admit: 2022-10-02 | Discharge: 2022-10-02 | Disposition: A | Discharge status: Home / Self Care | Payer: Medicare Other | Source: Ambulatory Visit | Attending: Internal Medicine | Admitting: Internal Medicine

## 2022-10-02 DIAGNOSIS — J439 Emphysema, unspecified: Secondary | ICD-10-CM | POA: Diagnosis not present

## 2022-10-02 DIAGNOSIS — C3490 Malignant neoplasm of unspecified part of unspecified bronchus or lung: Secondary | ICD-10-CM | POA: Insufficient documentation

## 2022-10-02 MED ORDER — IOHEXOL 300 MG/ML  SOLN
100.0000 mL | Freq: Once | INTRAMUSCULAR | Status: AC | PRN
  Administered 2022-10-02: 75 mL via INTRAVENOUS

## 2022-10-04 NOTE — Progress Notes (Deleted)
[facility] OFFICE PROGRESS NOTE  Cameron Roman [address] [place] [address]  DIAGNOSIS: Stage IIIB (T3, N2, M0) non-small cell lung cancer, squamous cell carcinoma presented with large left lower lobe lung mass in addition subcarinal and AP window nodal metastases. Diagnosed in September 2023.     PRIOR THERAPY: Concurrent chemo/radiation with carboplatin for an AUC of 2 and paclitaxel 45 mg/m2 weekly. First dose expected 01/10/22. Status post 7 cycles.  Last dose was giving February 15, 2022 with partial response.    CURRENT THERAPY: Consolidation treatment with immunotherapy with Imfinzi 1500 Mg IV every 4 weeks. First dose March 31, 2022.  Status post 6 cycles.     INTERVAL HISTORY: Cameron Roman 72 y.o. male returns to the clinic today for a follow-up visit.  The patient was last seen on 09/08/22 by Dr. Arbutus Ped. the patient is currently on treatment with consolidation immunotherapy with Imfinzi every 4 weeks.  He is status post 3 cycles.  He tolerates it well without any concerning adverse side effects.  Today he denies any fever, chills, night sweats, or unexplained weight loss. He has baseline shortness of breath with exertion due to his COPD. Denies any chest pain or hemoptysis.  He reports baseline dry cough which is unchanged.  Denies any headache or visual changes.  Denies any rashes or skin changes.  Denies any nausea, vomiting, diarrhea, or constipation.  The patient recently had a restaging CT scan of his chest. He is here today for evaluation and to review his scan results before starting cycle #7.   MEDICAL HISTORY: Past Medical History:  Diagnosis Date   Anxiety    Arthritis    Atrial fibrillation (HCC)    Chronic back pain    buldging disc   Chronic neck pain    ruptured disc   Cough    hx smoking   Diverticulosis    Esophageal stricture    GERD (gastroesophageal reflux disease)    takes Prilosec daily   H/O hiatal hernia    Hearing loss     Hepatitis-C 2017   treated and cured per patient   Hiatal hernia    History of colon polyps    History of radiation therapy    Left Lung- 01/10/22-02/18/22- Dr. Antony Blackbird   Insomnia    takes Elavil nightly   Lipoma of abdominal wall 02/11/2011   Lung cancer (HCC)    Stroke (HCC)    x 2   Type 2 diabetes mellitus (HCC) 07/22/2020   patient denies DM, per MD note pt is pre-diabetic    ALLERGIES:  is allergic to percocet [oxycodone-acetaminophen].  MEDICATIONS:  Current Outpatient Medications  Medication Sig Dispense Refill   durvalumab 1,500 mg in sodium chloride 0.9 % 100 mL Inject 1,500 mg into the vein every 28 (twenty-eight) days.     ELIQUIS 5 MG TABS tablet TAKE ONE TABLET BY MOUTH TWICE DAILY 60 tablet 5   fluticasone (FLONASE) 50 MCG/ACT nasal spray Place 1 spray into the nose in the morning.     gabapentin (NEURONTIN) 300 MG capsule Take 1 capsule (300 mg total) by mouth 3 (three) times daily. Take one capsule twice to three times daily (Patient taking differently: Take 300 mg by mouth 2 (two) times daily.) 270 capsule 3   HYDROcodone-acetaminophen (NORCO) 10-325 MG tablet Take 1 tablet by mouth 3 (three) times daily.     Omega-3 Fatty Acids (FISH OIL) 1000 MG CAPS Take 1,000 mg by  mouth in the morning and at bedtime.     omeprazole (PRILOSEC) 20 MG capsule Take 1 capsule (20 mg total) by mouth daily. 90 capsule 3   pantoprazole (PROTONIX) 40 MG tablet Take 1 tablet (40 mg total) by mouth daily. 90 tablet 3   pravastatin (PRAVACHOL) 20 MG tablet Take 1 tablet (20 mg total) by mouth daily. Take one tablet once daily (Patient taking differently: Take 20 mg by mouth at bedtime. Take one tablet once daily) 90 tablet 3   prochlorperazine (COMPAZINE) 10 MG tablet Take 1 tablet (10 mg total) by mouth every 6 (six) hours as needed for nausea or vomiting. (Patient not taking: Reported on 05/26/2022) 30 tablet 1   sucralfate (CARAFATE) 1 g tablet Take 1 g by mouth 4 (four) times  daily.     tamsulosin (FLOMAX) 0.4 MG CAPS capsule Take 1 capsule (0.4 mg total) by mouth daily. (Patient not taking: Reported on 07/05/2022) 90 capsule 3   Current Facility-Administered Medications  Medication Dose Route Frequency Provider Last Rate Last Admin   methylPREDNISolone acetate (DEPO-MEDROL) injection 80 mg  80 mg Intramuscular Once Dettinger, Elige Radon, MD        SURGICAL HISTORY:  Past Surgical History:  Procedure Laterality Date   ANTERIOR CERVICAL DECOMP/DISCECTOMY FUSION  04/12/2011   Procedure: ANTERIOR CERVICAL DECOMPRESSION/DISCECTOMY FUSION 2 LEVELS;  Surgeon: Dorian Heckle, MD;  Location: [facility];  Service: Neurosurgery;  Laterality: N/A;  exploration of Cervical four - seven  fusion with redo Cervical six- seven, cervical three- four anterior cervical decompression with fusion interbody prothesis plating and bonegraft and C34 anterior cervical decompression with inte   APPENDECTOMY     at age 89   BRONCHIAL BIOPSY  11/30/2021   Procedure: BRONCHIAL BIOPSIES;  Surgeon: Josephine Igo, DO;  Location: [facility];  Service: Pulmonary;;   BRONCHIAL BRUSHINGS  11/30/2021   Procedure: BRONCHIAL BRUSHINGS;  Surgeon: Josephine Igo, DO;  Location: [facility];  Service: Pulmonary;;   CARDIAC CATHETERIZATION  06/03/2004   COLONOSCOPY     EP IMPLANTABLE DEVICE N/A 09/14/2015   Procedure: Loop Recorder Insertion;  Surgeon: Marinus Maw, MD;  Location: [facility];  Service: Cardiovascular;  Laterality: N/A;   FINE NEEDLE ASPIRATION  11/30/2021   Procedure: FINE NEEDLE ASPIRATION (FNA) LINEAR;  Surgeon: Josephine Igo, DO;  Location: [facility];  Service: Pulmonary;;   HAND SURGERY Right 03/15/2003   right x 3   INNER EAR SURGERY Bilateral    bil;titanium in both ears   lower back surgery  03/15/2007   had plates and screws inserted   NECK SURGERY  03/15/2007   had insertion of  plates and screws   TEE WITHOUT CARDIOVERSION N/A 09/14/2015    Procedure: TRANSESOPHAGEAL ECHOCARDIOGRAM (TEE);  Surgeon: Vesta Mixer, MD;  Location[facility];  Service: Cardiovascular;  Laterality: N/A;   TRANSFORAMINAL LUMBAR INTERBODY FUSION (TLIF) WITH PEDICLE SCREW FIXATION 1 LEVEL Left 09/27/2019   Procedure: Left Lumbar 5 Sacral 1 Transforaminal lumbar interbody fusion with exploration/removal of adjacent level hardware;  Surgeon: Maeola Harman, MD;  Location: [facility] OR;  Service: Neurosurgery;  Laterality: Left;  3C/RM 19   VIDEO BRONCHOSCOPY WITH ENDOBRONCHIAL ULTRASOUND N/A 11/30/2021   Procedure: VIDEO BRONCHOSCOPY WITH ENDOBRONCHIAL ULTRASOUND;  Surgeon: Josephine Igo, DO;  Location: [facility] END[facility];  Service: Pulmonary;  Laterality: N/A;    REVIEW OF SYSTEMS:   Review of Systems  Constitutional: Negative for appetite change, chills, fatigue, fever and unexpected weight  change.  HENT:   Negative for mouth sores, nosebleeds, sore throat and trouble swallowing.   Eyes: Negative for eye problems and icterus.  Respiratory: Negative for cough, hemoptysis, shortness of breath and wheezing.   Cardiovascular: Negative for chest pain and leg swelling.  Gastrointestinal: Negative for abdominal pain, constipation, diarrhea, nausea and vomiting.  Genitourinary: Negative for bladder incontinence, difficulty urinating, dysuria, frequency and hematuria.   Musculoskeletal: Negative for back pain, gait problem, neck pain and neck stiffness.  Skin: Negative for itching and rash.  Neurological: Negative for dizziness, extremity weakness, gait problem, headaches, light-headedness and seizures.  Hematological: Negative for adenopathy. Does not bruise/bleed easily.  Psychiatric/Behavioral: Negative for confusion, depression and sleep disturbance. The patient is not nervous/anxious.     PHYSICAL EXAMINATION:  There were no vitals taken for this visit.  ECOG PERFORMANCE STATUS: {CHL ONC ECOG [id number]  Physical Exam  Constitutional: Oriented to person,  place, and time and well-developed, well-nourished, and in no distress. No distress.  HENT:  Head: Normocephalic and atraumatic.  Mouth/Throat: Oropharynx is clear and moist. No oropharyngeal exudate.  Eyes: Conjunctivae are normal. Right eye exhibits no discharge. Left eye exhibits no discharge. No scleral icterus.  Neck: Normal range of motion. Neck supple.  Cardiovascular: Normal rate, regular rhythm, normal heart sounds and intact distal pulses.   Pulmonary/Chest: Effort normal and breath sounds normal. No respiratory distress. No wheezes. No rales.  Abdominal: Soft. Bowel sounds are normal. Exhibits no distension and no mass. There is no tenderness.  Musculoskeletal: Normal range of motion. Exhibits no edema.  Lymphadenopathy:    No cervical adenopathy.  Neurological: Alert and oriented to person, place, and time. Exhibits normal muscle tone. Gait normal. Coordination normal.  Skin: Skin is warm and dry. No rash noted. Not diaphoretic. No erythema. No pallor.  Psychiatric: Mood, memory and judgment normal.  Vitals reviewed.  LABORATORY DATA: Lab Results  Component Value Date   WBC 7.5 09/08/2022   HGB 11.9 (L) 09/08/2022   HCT 35.3 (L) 09/08/2022   MCV 94.4 09/08/2022   PLT 333 09/08/2022      Chemistry      Component Value Date/Time   NA 138 09/08/2022 0902   NA 140 08/30/2021 1659   K 4.4 09/08/2022 0902   CL 107 09/08/2022 0902   CO2 24 09/08/2022 0902   BUN 14 09/08/2022 0902   BUN 17 08/30/2021 1659   CREATININE 0.78 09/08/2022 0902   CREATININE 0.80 08/30/2012 0955      Component Value Date/Time   CALCIUM 9.1 09/08/2022 0902   ALKPHOS 79 09/08/2022 0902   AST 11 (L) 09/08/2022 0902   ALT 5 09/08/2022 0902   BILITOT 0.5 09/08/2022 0902       RADIOGRAPHIC STUDIES:  No results found.   ASSESSMENT/PLAN:  This is a very pleasant 72 years old Caucasian male recently diagnosed with at least stage IIIB (T3, N2, M0) non-small cell lung cancer, squamous cell  carcinoma presented with large left lower lobe lung mass in addition to subcarinal and AP window lymphadenopathy diagnosed in September 2023.    He had a brain MRI that was negative for metastatic disease of the brain.    He completed a course of concurrent chemoradiation with carboplatin for an AUC of 2 and paclitaxel 45 mg per metered squared.  He is status post 7 cycles of treatment.     The patient is scheduled to start consolidation immunotherapy with Imfinzi 1500 mg IV every 4 weeks. He is status post  6 cycles.    The patient recently had a restaging CT scan performed.  Dr. Arbutus Ped personally and independently reviewed the scan and discussed results with the patient today.  The scan ***.   Recommend he *** on the same treatment at the same dose.  He will *** with cycle #7 today scheduled.   We will see him back for follow-up visit in 4 weeks for evaluation repeat blood work before undergoing cycle #8.  The patient was advised to call immediately if he has any concerning symptoms in the interval. The patient voices understanding of current disease status and treatment options and is in agreement with the current care plan. All questions were answered. The patient knows to call the clinic with any problems, questions or concerns. We can certainly see the patient much sooner if necessary        No orders of the defined types were placed in this encounter.    I spent {CHL ONC TIME VISIT - NIOEV:[id number]} counseling the patient face to face. The total time spent in the appointment was {CHL ONC TIME VISIT - EXHBZ:[id number]}.   L , PA-C 10/04/22

## 2022-10-05 ENCOUNTER — Other Ambulatory Visit: Payer: Self-pay

## 2022-10-06 ENCOUNTER — Inpatient Hospital Stay: Payer: Medicare Other

## 2022-10-06 ENCOUNTER — Inpatient Hospital Stay: Payer: Medicare Other | Admitting: Physician Assistant

## 2022-10-06 ENCOUNTER — Other Ambulatory Visit: Payer: Self-pay

## 2022-10-06 ENCOUNTER — Inpatient Hospital Stay (HOSPITAL_BASED_OUTPATIENT_CLINIC_OR_DEPARTMENT_OTHER): Payer: Medicare Other | Admitting: Internal Medicine

## 2022-10-06 ENCOUNTER — Inpatient Hospital Stay: Payer: Medicare Other | Attending: Internal Medicine

## 2022-10-06 VITALS — BP 107/72 | HR 78 | Temp 98.5°F | Resp 17 | Ht 71.0 in | Wt 138.6 lb

## 2022-10-06 DIAGNOSIS — C771 Secondary and unspecified malignant neoplasm of intrathoracic lymph nodes: Secondary | ICD-10-CM | POA: Insufficient documentation

## 2022-10-06 DIAGNOSIS — C3412 Malignant neoplasm of upper lobe, left bronchus or lung: Secondary | ICD-10-CM

## 2022-10-06 DIAGNOSIS — R634 Abnormal weight loss: Secondary | ICD-10-CM | POA: Diagnosis not present

## 2022-10-06 DIAGNOSIS — Z79899 Other long term (current) drug therapy: Secondary | ICD-10-CM | POA: Insufficient documentation

## 2022-10-06 DIAGNOSIS — C3490 Malignant neoplasm of unspecified part of unspecified bronchus or lung: Secondary | ICD-10-CM

## 2022-10-06 DIAGNOSIS — C3432 Malignant neoplasm of lower lobe, left bronchus or lung: Secondary | ICD-10-CM | POA: Insufficient documentation

## 2022-10-06 LAB — CBC WITH DIFFERENTIAL (CANCER CENTER ONLY)
Abs Immature Granulocytes: 0.01 10*3/uL (ref 0.00–0.07)
Basophils Absolute: 0.1 10*3/uL (ref 0.0–0.1)
Basophils Relative: 1 %
Eosinophils Absolute: 0.1 10*3/uL (ref 0.0–0.5)
Eosinophils Relative: 1 %
HCT: 38.5 % — ABNORMAL LOW (ref 39.0–52.0)
Hemoglobin: 13.1 g/dL (ref 13.0–17.0)
Immature Granulocytes: 0 %
Lymphocytes Relative: 25 %
Lymphs Abs: 1.6 10*3/uL (ref 0.7–4.0)
MCH: 31.4 pg (ref 26.0–34.0)
MCHC: 34 g/dL (ref 30.0–36.0)
MCV: 92.3 fL (ref 80.0–100.0)
Monocytes Absolute: 0.7 10*3/uL (ref 0.1–1.0)
Monocytes Relative: 10 %
Neutro Abs: 4.1 10*3/uL (ref 1.7–7.7)
Neutrophils Relative %: 63 %
Platelet Count: 294 10*3/uL (ref 150–400)
RBC: 4.17 MIL/uL — ABNORMAL LOW (ref 4.22–5.81)
RDW: 15.9 % — ABNORMAL HIGH (ref 11.5–15.5)
WBC Count: 6.5 10*3/uL (ref 4.0–10.5)
nRBC: 0 % (ref 0.0–0.2)

## 2022-10-06 LAB — CMP (CANCER CENTER ONLY)
ALT: 5 U/L (ref 0–44)
AST: 10 U/L — ABNORMAL LOW (ref 15–41)
Albumin: 3.7 g/dL (ref 3.5–5.0)
Alkaline Phosphatase: 74 U/L (ref 38–126)
Anion gap: 6 (ref 5–15)
BUN: 17 mg/dL (ref 8–23)
CO2: 26 mmol/L (ref 22–32)
Calcium: 9.2 mg/dL (ref 8.9–10.3)
Chloride: 107 mmol/L (ref 98–111)
Creatinine: 0.87 mg/dL (ref 0.61–1.24)
GFR, Estimated: >60 mL/min (ref >60)
Glucose, Bld: 86 mg/dL (ref 70–99)
Potassium: 4.5 mmol/L (ref 3.5–5.1)
Sodium: 139 mmol/L (ref 135–145)
Total Bilirubin: 0.5 mg/dL (ref 0.3–1.2)
Total Protein: 7 g/dL (ref 6.5–8.1)

## 2022-10-06 MED ORDER — PREDNISONE 10 MG PO TABS: ORAL_TABLET | ORAL | 0 refills | Status: DC

## 2022-10-06 NOTE — Progress Notes (Signed)
[facility] [phone]   Fax:([phone]) [phone]  OFFICE PROGRESS NOTE  [name], PA-C [address] [place] [address]  DIAGNOSIS:  Stage IIIB (T3, N2, M0) non-small cell lung cancer, squamous cell carcinoma presented with large left lower lobe lung mass in addition subcarinal and AP window nodal metastases. Diagnosed in September 2023.      PRIOR THERAPY: Concurrent chemo/radiation with carboplatin for an AUC of 2 and paclitaxel 45 mg/m2 weekly. First dose expected 01/10/22. Status post 7 cycles.  Last dose was giving February 15, 2022 with partial response.   CURRENT THERAPY: Consolidation treatment with immunotherapy with Imfinzi 1500 Mg IV every 4 weeks.  First dose March 31, 2022.  Status post 6 cycles.  INTERVAL HISTORY: Cameron Roman 72 y.o. male last visit.  The patient is feeling fine today with no concerning complaints except for fatigue and worsening shortness of breath with minimal exertion.  He also has a lot of wheezing.  He denied having any chest pain or hemoptysis.  He has no nausea, vomiting, diarrhea or constipation.  He has no headache or visual changes.  He lost around 10 pounds in the last few months.  He has no fever or chills.  He has been tolerating his treatment with immunotherapy fairly well.  He had repeat CT scan of the chest performed recently and is here for evaluation and discussion of his scan results.  MEDICAL HISTORY: Past Medical History:  Diagnosis Date   Anxiety    Arthritis    Atrial fibrillation (HCC)    Chronic back pain    buldging disc   Chronic neck pain    ruptured disc   Cough    hx smoking   Diverticulosis    Esophageal stricture    GERD (gastroesophageal reflux disease)    takes Prilosec daily   H/O hiatal hernia    Hearing loss    Hepatitis-C 2017   treated and cured per patient   Hiatal hernia    History of colon polyps    History of radiation therapy    Left Lung- 01/10/22-02/18/22- Dr.  Antony Blackbird   Insomnia    takes Elavil nightly   Lipoma of abdominal wall 02/11/2011   Lung cancer (HCC)    Stroke (HCC)    x 2   Type 2 diabetes mellitus (HCC) 07/22/2020   patient denies DM, per MD note pt is pre-diabetic    ALLERGIES:  is allergic to percocet [oxycodone-acetaminophen].  MEDICATIONS:  Current Outpatient Medications  Medication Sig Dispense Refill   durvalumab 1,500 mg in sodium chloride 0.9 % 100 mL Inject 1,500 mg into the vein every 28 (twenty-eight) days.     ELIQUIS 5 MG TABS tablet TAKE ONE TABLET BY MOUTH TWICE DAILY 60 tablet 5   fluticasone (FLONASE) 50 MCG/ACT nasal spray Place 1 spray into the nose in the morning.     gabapentin (NEURONTIN) 300 MG capsule Take 1 capsule (300 mg total) by mouth 3 (three) times daily. Take one capsule twice to three times daily (Patient taking differently: Take 300 mg by mouth 2 (two) times daily.) 270 capsule 3   HYDROcodone-acetaminophen (NORCO) 10-325 MG tablet Take 1 tablet by mouth 3 (three) times daily.     Omega-3 Fatty Acids (FISH OIL) 1000 MG CAPS Take 1,000 mg by mouth in the morning and at bedtime.     omeprazole (PRILOSEC) 20 MG capsule Take 1 capsule (20 mg total) by mouth  daily. 90 capsule 3   pantoprazole (PROTONIX) 40 MG tablet Take 1 tablet (40 mg total) by mouth daily. 90 tablet 3   pravastatin (PRAVACHOL) 20 MG tablet Take 1 tablet (20 mg total) by mouth daily. Take one tablet once daily (Patient taking differently: Take 20 mg by mouth at bedtime. Take one tablet once daily) 90 tablet 3   prochlorperazine (COMPAZINE) 10 MG tablet Take 1 tablet (10 mg total) by mouth every 6 (six) hours as needed for nausea or vomiting. (Patient not taking: Reported on 05/26/2022) 30 tablet 1   sucralfate (CARAFATE) 1 g tablet Take 1 g by mouth 4 (four) times daily.     tamsulosin (FLOMAX) 0.4 MG CAPS capsule Take 1 capsule (0.4 mg total) by mouth daily. (Patient not taking: Reported on 07/05/2022) 90 capsule 3   Current  Facility-Administered Medications  Medication Dose Route Frequency Provider Last Rate Last Admin   methylPREDNISolone acetate (DEPO-MEDROL) injection 80 mg  80 mg Intramuscular Once Dettinger, Elige Radon, MD        SURGICAL HISTORY:  Past Surgical History:  Procedure Laterality Date   ANTERIOR CERVICAL DECOMP/DISCECTOMY FUSION  04/12/2011   Procedure: ANTERIOR CERVICAL DECOMPRESSION/DISCECTOMY FUSION 2 LEVELS;  Surgeon: Dorian Heckle, MD;  Location: [facility];  Service: Neurosurgery;  Laterality: N/A;  exploration of Cervical four - seven  fusion with redo Cervical six- seven, cervical three- four anterior cervical decompression with fusion interbody prothesis plating and bonegraft and C34 anterior cervical decompression with inte   APPENDECTOMY     at age 36   BRONCHIAL BIOPSY  11/30/2021   Procedure: BRONCHIAL BIOPSIES;  Surgeon: Josephine Igo, DO;  Location: [facility];  Service: Pulmonary;;   BRONCHIAL BRUSHINGS  11/30/2021   Procedure: BRONCHIAL BRUSHINGS;  Surgeon: Josephine Igo, DO;  Location: [facility];  Service: Pulmonary;;   CARDIAC CATHETERIZATION  06/03/2004   COLONOSCOPY     EP IMPLANTABLE DEVICE N/A 09/14/2015   Procedure: Loop Recorder Insertion;  Surgeon: Marinus Maw, MD;  Location: [facility];  Service: Cardiovascular;  Laterality: N/A;   FINE NEEDLE ASPIRATION  11/30/2021   Procedure: FINE NEEDLE ASPIRATION (FNA) LINEAR;  Surgeon: Josephine Igo, DO;  Location: [facility];  Service: Pulmonary;;   HAND SURGERY Right 03/15/2003   right x 3   INNER EAR SURGERY Bilateral    bil;titanium in both ears   lower back surgery  03/15/2007   had plates and screws inserted   NECK SURGERY  03/15/2007   had insertion of  plates and screws   TEE WITHOUT CARDIOVERSION N/A 09/14/2015   Procedure: TRANSESOPHAGEAL ECHOCARDIOGRAM (TEE);  Surgeon: Vesta Mixer, MD;  [facility];  Service: Cardiovascular;  Laterality: N/A;   TRANSFORAMINAL LUMBAR  INTERBODY FUSION (TLIF) WITH PEDICLE SCREW FIXATION 1 LEVEL Left 09/27/2019   Procedure: Left Lumbar 5 Sacral 1 Transforaminal lumbar interbody fusion with exploration/removal of adjacent level hardware;  Surgeon: Maeola Harman, MD;  Location: [facility] OR;  Service: Neurosurgery;  Laterality: Left;  3C/RM 19   VIDEO BRONCHOSCOPY WITH ENDOBRONCHIAL ULTRASOUND N/A 11/30/2021   Procedure: VIDEO BRONCHOSCOPY WITH ENDOBRONCHIAL ULTRASOUND;  Surgeon: Josephine Igo, DO;  Location: [facility];  Service: Pulmonary;  Laterality: N/A;    REVIEW OF SYSTEMS:  Constitutional: positive for fatigue and weight loss Eyes: negative Ears, nose, mouth, throat, and face: negative Respiratory: positive for dyspnea on exertion and wheezing Cardiovascular: negative Gastrointestinal: negative Genitourinary:negative Integument/breast: negative Hematologic/lymphatic: negative Musculoskeletal:negative Neurological: negative Behavioral/Psych: negative Endocrine: negative  Allergic/Immunologic: negative   PHYSICAL EXAMINATION: General appearance: alert, cooperative, fatigued, and no distress Head: Normocephalic, without obvious abnormality, atraumatic Neck: no adenopathy, no JVD, supple, symmetrical, trachea midline, and thyroid not enlarged, symmetric, no tenderness/mass/nodules Lymph nodes: Cervical, supraclavicular, and axillary nodes normal. Resp: clear to auscultation bilaterally Back: symmetric, no curvature. ROM normal. No CVA tenderness. Cardio: regular rate and rhythm, S1, S2 normal, no murmur, click, rub or gallop GI: soft, non-tender; bowel sounds normal; no masses,  no organomegaly Extremities: extremities normal, atraumatic, no cyanosis or edema Neurologic: Alert and oriented X 3, normal strength and tone. Normal symmetric reflexes. Normal coordination and gait  ECOG PERFORMANCE STATUS: 1 - Symptomatic but completely ambulatory  Blood pressure 107/72, pulse 78, temperature 98.5 F (36.9 C),  temperature source Oral, resp. rate 17, height 5\' 11"  (1.803 m), weight 138 lb 9.6 oz (62.9 kg), SpO2 98%.  LABORATORY DATA: Lab Results  Component Value Date   WBC 6.5 10/06/2022   HGB 13.1 10/06/2022   HCT 38.5 (L) 10/06/2022   MCV 92.3 10/06/2022   PLT 294 10/06/2022      Chemistry      Component Value Date/Time   NA 138 09/08/2022 0902   NA 140 08/30/2021 1659   K 4.4 09/08/2022 0902   CL 107 09/08/2022 0902   CO2 24 09/08/2022 0902   BUN 14 09/08/2022 0902   BUN 17 08/30/2021 1659   CREATININE 0.78 09/08/2022 0902   CREATININE 0.80 08/30/2012 0955      Component Value Date/Time   CALCIUM 9.1 09/08/2022 0902   ALKPHOS 79 09/08/2022 0902   AST 11 (L) 09/08/2022 0902   ALT 5 09/08/2022 0902   BILITOT 0.5 09/08/2022 0902       RADIOGRAPHIC STUDIES: CT Chest W Contrast  Result Date: 10/05/2022 CLINICAL DATA:  Non-small cell lung cancer. Status post chemotherapy and radiation. * Tracking Code: BO *. EXAM: CT CHEST WITH CONTRAST TECHNIQUE: Multidetector CT imaging of the chest was performed during intravenous contrast administration. RADIATION DOSE REDUCTION: This exam was performed according to the departmental dose-optimization program which includes automated exposure control, adjustment of the mA and/or kV according to patient size and/or use of iterative reconstruction technique. CONTRAST:  75mL OMNIPAQUE IOHEXOL 300 MG/ML  SOLN COMPARISON:  06/17/2022 FINDINGS: Cardiovascular: Normal heart size. Small volume of pericardial fluid, stable. Aortic atherosclerosis and multi vessel coronary artery calcifications. Mediastinum/Nodes: Thyroid gland, trachea, and esophagus appear within normal limits. No mediastinal adenopathy. No hilar adenopathy. Lungs/Pleura: No pleural effusion. Advanced changes of emphysema. Interval development of extensive, lower lung zone predominant interstitial reticulation with patchy ground-glass attenuation. Paramediastinal and perihilar radiation  changes identified within the right lung. Increased soft tissue within the posterior perihilar region of the left lung which measures 4.3 x 2.9 by 6.1 cm, image 77/2 and image 53/5. On the previous exam this area measured 2.8 x 2.7 by 4.9 cm. Upper Abdomen: No acute abnormality. Morphologic features of the liver compatible with cirrhosis. Within the inferior right lobe of liver there is a small enhancing lesion measuring 1.7 cm, image 183/2. Indeterminate. Musculoskeletal: No acute or suspicious bone. No chest wall mass identified. Lesions. IMPRESSION: 1. Interval increase in size of the posterior perihilar soft tissue mass within the left lung. This is concerning for recurrent disease. 2. Interval development of extensive, lower lung zone predominant interstitial reticulation with patchy ground-glass attenuation. Findings are concerning for interstitial lung disease. Cannot exclude hypersensitivity pneumonitis secondary to chemotherapy. 3. Cirrhosis. 4. Small enhancing lesion within the inferior  right lobe of liver measuring 1.7 cm. Indeterminate. This was not clearly demonstrated on previous imaging. Consider further evaluation with MRI of the abdomen with and without contrast. 5. Aortic Atherosclerosis (ICD10-I70.0) and Emphysema (ICD10-J43.9). Electronically Signed   By: Signa Kell M.D.   On: 10/05/2022 16:06    ASSESSMENT AND PLAN:  This is a very pleasant 72 years old white male diagnosed with a stage IIIb (T3, N2, M0) non-small cell lung cancer, squamous cell carcinoma presented with large left lower lobe lung mass in addition to subcarinal and AP window lymphadenopathy diagnosed in September 2023.  The patient underwent a course of concurrent chemoradiation with weekly carboplatin and paclitaxel status post 7 cycles of the chemotherapy.  He tolerated this treatment well with no concerning adverse effects except for the radiation-induced esophagitis and odynophagia.  He is recovering well from his  treatment. He is currently undergoing a course of consolidation treatment with immunotherapy with Imfinzi 1500 Mg IV every 4 weeks status post 6 cycles.   The patient has been tolerating this treatment well but has been complaining of increasing fatigue and weakness as well as weight loss and worsening dyspnea at baseline increased with exertion.  He had repeat CT scan of the chest performed recently.  I personally and independently reviewed the scan images and discussed the results with the patient today.  His scan showed interval increase in size of posterior perihilar soft tissue mass within the left lung concerning for disease.  Recurrence but he also has a lot of postradiation changes in that area concerning for interstitial lung disease or hypersensitivity pneumonitis. I recommended for the patient to hold his treatment with immunotherapy for now. I will start him on a tapered dose of prednisone for the next 6 weeks.  He was also advised to continue on Protonix 40 mg p.o. daily for GI prophylaxis. I will repeat his CT scan of the chest at that time and if he has evidence for disease progression, I will discuss with him other treatment options. The patient was advised to call immediately if he has any other concerning symptoms in the interval. The patient voices understanding of current disease status and treatment options and is in agreement with the current care plan.  All questions were answered. The patient knows to call the clinic with any problems, questions or concerns. We can certainly see the patient much sooner if necessary.  The total time spent in the appointment was 30 minutes.  Disclaimer: This note was dictated with voice recognition software. Similar sounding words can inadvertently be transcribed and may not be corrected upon review.

## 2022-10-13 ENCOUNTER — Other Ambulatory Visit: Payer: Self-pay | Admitting: Nurse Practitioner

## 2022-10-13 DIAGNOSIS — K7469 Other cirrhosis of liver: Secondary | ICD-10-CM | POA: Diagnosis not present

## 2022-10-13 DIAGNOSIS — D376 Neoplasm of uncertain behavior of liver, gallbladder and bile ducts: Secondary | ICD-10-CM | POA: Diagnosis not present

## 2022-10-19 ENCOUNTER — Other Ambulatory Visit: Payer: Self-pay

## 2022-10-20 ENCOUNTER — Other Ambulatory Visit: Payer: Medicare Other

## 2022-10-27 ENCOUNTER — Ambulatory Visit
Admission: RE | Admit: 2022-10-27 | Discharge: 2022-10-27 | Disposition: A | Discharge status: Home / Self Care | Payer: Medicare Other | Source: Ambulatory Visit | Attending: Nurse Practitioner | Admitting: Nurse Practitioner

## 2022-10-27 DIAGNOSIS — K7469 Other cirrhosis of liver: Secondary | ICD-10-CM

## 2022-10-27 DIAGNOSIS — K7460 Unspecified cirrhosis of liver: Secondary | ICD-10-CM | POA: Diagnosis not present

## 2022-10-27 DIAGNOSIS — K8020 Calculus of gallbladder without cholecystitis without obstruction: Secondary | ICD-10-CM | POA: Diagnosis not present

## 2022-10-29 ENCOUNTER — Other Ambulatory Visit: Payer: Self-pay

## 2022-10-31 ENCOUNTER — Other Ambulatory Visit: Payer: Self-pay | Admitting: Nurse Practitioner

## 2022-10-31 DIAGNOSIS — K7469 Other cirrhosis of liver: Secondary | ICD-10-CM

## 2022-10-31 DIAGNOSIS — D376 Neoplasm of uncertain behavior of liver, gallbladder and bile ducts: Secondary | ICD-10-CM

## 2022-11-03 ENCOUNTER — Other Ambulatory Visit: Payer: Medicare Other

## 2022-11-03 ENCOUNTER — Ambulatory Visit: Payer: Medicare Other

## 2022-11-03 ENCOUNTER — Ambulatory Visit: Payer: Medicare Other | Admitting: Internal Medicine

## 2022-11-09 ENCOUNTER — Other Ambulatory Visit: Payer: Self-pay | Admitting: Internal Medicine

## 2022-11-10 DIAGNOSIS — G8929 Other chronic pain: Secondary | ICD-10-CM | POA: Diagnosis not present

## 2022-11-10 DIAGNOSIS — C3412 Malignant neoplasm of upper lobe, left bronchus or lung: Secondary | ICD-10-CM | POA: Diagnosis not present

## 2022-11-10 DIAGNOSIS — M5136 Other intervertebral disc degeneration, lumbar region: Secondary | ICD-10-CM | POA: Diagnosis not present

## 2022-11-10 DIAGNOSIS — M545 Low back pain, unspecified: Secondary | ICD-10-CM | POA: Diagnosis not present

## 2022-11-10 DIAGNOSIS — E538 Deficiency of other specified B group vitamins: Secondary | ICD-10-CM | POA: Diagnosis not present

## 2022-11-16 ENCOUNTER — Other Ambulatory Visit: Payer: Self-pay

## 2022-11-17 ENCOUNTER — Ambulatory Visit (HOSPITAL_COMMUNITY)
Admission: RE | Admit: 2022-11-17 | Discharge: 2022-11-17 | Disposition: A | Discharge status: Home / Self Care | Payer: Medicare Other | Source: Ambulatory Visit | Attending: Internal Medicine | Admitting: Internal Medicine

## 2022-11-17 ENCOUNTER — Inpatient Hospital Stay: Payer: Medicare Other | Attending: Internal Medicine

## 2022-11-17 DIAGNOSIS — J929 Pleural plaque without asbestos: Secondary | ICD-10-CM | POA: Diagnosis not present

## 2022-11-17 DIAGNOSIS — R918 Other nonspecific abnormal finding of lung field: Secondary | ICD-10-CM | POA: Diagnosis not present

## 2022-11-17 DIAGNOSIS — J984 Other disorders of lung: Secondary | ICD-10-CM | POA: Insufficient documentation

## 2022-11-17 DIAGNOSIS — J439 Emphysema, unspecified: Secondary | ICD-10-CM | POA: Diagnosis not present

## 2022-11-17 DIAGNOSIS — J449 Chronic obstructive pulmonary disease, unspecified: Secondary | ICD-10-CM | POA: Insufficient documentation

## 2022-11-17 DIAGNOSIS — C3490 Malignant neoplasm of unspecified part of unspecified bronchus or lung: Secondary | ICD-10-CM | POA: Insufficient documentation

## 2022-11-17 DIAGNOSIS — C3412 Malignant neoplasm of upper lobe, left bronchus or lung: Secondary | ICD-10-CM

## 2022-11-17 DIAGNOSIS — M791 Myalgia, unspecified site: Secondary | ICD-10-CM | POA: Insufficient documentation

## 2022-11-17 DIAGNOSIS — R5383 Other fatigue: Secondary | ICD-10-CM | POA: Insufficient documentation

## 2022-11-17 DIAGNOSIS — C3432 Malignant neoplasm of lower lobe, left bronchus or lung: Secondary | ICD-10-CM | POA: Insufficient documentation

## 2022-11-17 DIAGNOSIS — C7951 Secondary malignant neoplasm of bone: Secondary | ICD-10-CM | POA: Insufficient documentation

## 2022-11-17 LAB — CMP (CANCER CENTER ONLY)
ALT: 10 U/L (ref 0–44)
AST: 12 U/L — ABNORMAL LOW (ref 15–41)
Albumin: 3.8 g/dL (ref 3.5–5.0)
Alkaline Phosphatase: 64 U/L (ref 38–126)
Anion gap: 5 (ref 5–15)
BUN: 18 mg/dL (ref 8–23)
CO2: 28 mmol/L (ref 22–32)
Calcium: 9.2 mg/dL (ref 8.9–10.3)
Chloride: 104 mmol/L (ref 98–111)
Creatinine: 0.99 mg/dL (ref 0.61–1.24)
GFR, Estimated: >60 mL/min (ref >60)
Glucose, Bld: 135 mg/dL — ABNORMAL HIGH (ref 70–99)
Potassium: 4.4 mmol/L (ref 3.5–5.1)
Sodium: 137 mmol/L (ref 135–145)
Total Bilirubin: 0.4 mg/dL (ref 0.3–1.2)
Total Protein: 7.1 g/dL (ref 6.5–8.1)

## 2022-11-17 LAB — CBC WITH DIFFERENTIAL (CANCER CENTER ONLY)
Abs Immature Granulocytes: 0.03 10*3/uL (ref 0.00–0.07)
Basophils Absolute: 0 10*3/uL (ref 0.0–0.1)
Basophils Relative: 0 %
Eosinophils Absolute: 0 10*3/uL (ref 0.0–0.5)
Eosinophils Relative: 0 %
HCT: 37.2 % — ABNORMAL LOW (ref 39.0–52.0)
Hemoglobin: 12.6 g/dL — ABNORMAL LOW (ref 13.0–17.0)
Immature Granulocytes: 0 %
Lymphocytes Relative: 16 %
Lymphs Abs: 1.3 10*3/uL (ref 0.7–4.0)
MCH: 31.2 pg (ref 26.0–34.0)
MCHC: 33.9 g/dL (ref 30.0–36.0)
MCV: 92.1 fL (ref 80.0–100.0)
Monocytes Absolute: 0.4 10*3/uL (ref 0.1–1.0)
Monocytes Relative: 5 %
Neutro Abs: 6.3 10*3/uL (ref 1.7–7.7)
Neutrophils Relative %: 79 %
Platelet Count: 260 10*3/uL (ref 150–400)
RBC: 4.04 MIL/uL — ABNORMAL LOW (ref 4.22–5.81)
RDW: 17.7 % — ABNORMAL HIGH (ref 11.5–15.5)
WBC Count: 8.1 10*3/uL (ref 4.0–10.5)
nRBC: 0 % (ref 0.0–0.2)

## 2022-11-17 MED ORDER — IOHEXOL 300 MG/ML  SOLN
80.0000 mL | Freq: Once | INTRAMUSCULAR | Status: AC | PRN
  Administered 2022-11-17: 75 mL via INTRAVENOUS

## 2022-11-17 MED ORDER — SODIUM CHLORIDE (PF) 0.9 % IJ SOLN
INTRAMUSCULAR | Status: AC
  Filled 2022-11-17: qty 50

## 2022-11-21 ENCOUNTER — Ambulatory Visit
Admission: RE | Admit: 2022-11-21 | Discharge: 2022-11-21 | Disposition: A | Discharge status: Home / Self Care | Payer: Medicare Other | Source: Ambulatory Visit | Attending: Nurse Practitioner

## 2022-11-21 DIAGNOSIS — D376 Neoplasm of uncertain behavior of liver, gallbladder and bile ducts: Secondary | ICD-10-CM | POA: Diagnosis not present

## 2022-11-21 DIAGNOSIS — K7469 Other cirrhosis of liver: Secondary | ICD-10-CM | POA: Diagnosis not present

## 2022-11-21 MED ORDER — GADOPICLENOL 0.5 MMOL/ML IV SOLN
7.0000 mL | Freq: Once | INTRAVENOUS | Status: AC | PRN
  Administered 2022-11-21: 7 mL via INTRAVENOUS

## 2022-11-24 ENCOUNTER — Ambulatory Visit: Payer: Medicare Other | Admitting: Internal Medicine

## 2022-11-24 ENCOUNTER — Inpatient Hospital Stay: Payer: Medicare Other

## 2022-11-24 ENCOUNTER — Inpatient Hospital Stay: Payer: Medicare Other | Admitting: Internal Medicine

## 2022-11-24 ENCOUNTER — Telehealth: Payer: Self-pay | Admitting: Medical Oncology

## 2022-11-24 NOTE — Telephone Encounter (Signed)
Pt appt today -He said he got a automated message last night about his appt today. He is working today . He needs a week notice for his appt.so he can make arrangements to be off work. Schedule message sent

## 2022-11-25 ENCOUNTER — Telehealth: Payer: Self-pay | Admitting: Internal Medicine

## 2022-11-25 ENCOUNTER — Other Ambulatory Visit: Payer: Self-pay

## 2022-12-01 ENCOUNTER — Other Ambulatory Visit: Payer: Medicare Other

## 2022-12-01 ENCOUNTER — Ambulatory Visit: Payer: Medicare Other

## 2022-12-01 ENCOUNTER — Inpatient Hospital Stay: Payer: Medicare Other | Admitting: Internal Medicine

## 2022-12-01 ENCOUNTER — Ambulatory Visit: Payer: Medicare Other | Admitting: Internal Medicine

## 2022-12-01 ENCOUNTER — Telehealth: Payer: Self-pay | Admitting: Medical Oncology

## 2022-12-01 ENCOUNTER — Inpatient Hospital Stay: Payer: Medicare Other

## 2022-12-01 NOTE — Telephone Encounter (Signed)
LVM on dtrs phone to ask Winfred if she can be added to his Release of information document.

## 2022-12-01 NOTE — Telephone Encounter (Signed)
No show today for appt. LVM to return my call to r/s.

## 2022-12-02 ENCOUNTER — Encounter: Payer: Self-pay | Admitting: Internal Medicine

## 2022-12-02 ENCOUNTER — Other Ambulatory Visit: Payer: Self-pay | Admitting: Physician Assistant

## 2022-12-03 ENCOUNTER — Other Ambulatory Visit: Payer: Self-pay

## 2022-12-05 ENCOUNTER — Other Ambulatory Visit: Payer: Self-pay

## 2022-12-05 ENCOUNTER — Telehealth: Payer: Self-pay | Admitting: Internal Medicine

## 2022-12-05 NOTE — Telephone Encounter (Signed)
Called patient regarding upcoming appointments, patient has been called and notified.

## 2022-12-06 ENCOUNTER — Other Ambulatory Visit: Payer: Self-pay | Admitting: Family Medicine

## 2022-12-06 DIAGNOSIS — E782 Mixed hyperlipidemia: Secondary | ICD-10-CM

## 2022-12-08 ENCOUNTER — Other Ambulatory Visit: Payer: Self-pay | Admitting: Internal Medicine

## 2022-12-08 ENCOUNTER — Inpatient Hospital Stay: Payer: Medicare Other

## 2022-12-08 ENCOUNTER — Ambulatory Visit: Payer: Medicare Other

## 2022-12-08 ENCOUNTER — Ambulatory Visit: Payer: Medicare Other | Admitting: Internal Medicine

## 2022-12-08 ENCOUNTER — Other Ambulatory Visit: Payer: Self-pay

## 2022-12-08 ENCOUNTER — Inpatient Hospital Stay (HOSPITAL_BASED_OUTPATIENT_CLINIC_OR_DEPARTMENT_OTHER): Payer: Medicare Other | Admitting: Internal Medicine

## 2022-12-08 ENCOUNTER — Other Ambulatory Visit: Payer: Medicare Other

## 2022-12-08 VITALS — BP 105/67 | HR 69 | Temp 98.5°F | Resp 16 | Ht 71.0 in | Wt 148.6 lb

## 2022-12-08 DIAGNOSIS — C3412 Malignant neoplasm of upper lobe, left bronchus or lung: Secondary | ICD-10-CM

## 2022-12-08 DIAGNOSIS — J984 Other disorders of lung: Secondary | ICD-10-CM | POA: Diagnosis not present

## 2022-12-08 DIAGNOSIS — M791 Myalgia, unspecified site: Secondary | ICD-10-CM | POA: Diagnosis not present

## 2022-12-08 DIAGNOSIS — R5383 Other fatigue: Secondary | ICD-10-CM | POA: Diagnosis not present

## 2022-12-08 DIAGNOSIS — C3490 Malignant neoplasm of unspecified part of unspecified bronchus or lung: Secondary | ICD-10-CM | POA: Diagnosis not present

## 2022-12-08 DIAGNOSIS — C3432 Malignant neoplasm of lower lobe, left bronchus or lung: Secondary | ICD-10-CM | POA: Diagnosis not present

## 2022-12-08 DIAGNOSIS — C7951 Secondary malignant neoplasm of bone: Secondary | ICD-10-CM | POA: Diagnosis not present

## 2022-12-08 DIAGNOSIS — J449 Chronic obstructive pulmonary disease, unspecified: Secondary | ICD-10-CM | POA: Diagnosis not present

## 2022-12-08 LAB — CBC WITH DIFFERENTIAL (CANCER CENTER ONLY)
Abs Immature Granulocytes: 0.02 10*3/uL (ref 0.00–0.07)
Basophils Absolute: 0 10*3/uL (ref 0.0–0.1)
Basophils Relative: 1 %
Eosinophils Absolute: 0.1 10*3/uL (ref 0.0–0.5)
Eosinophils Relative: 1 %
HCT: 34.2 % — ABNORMAL LOW (ref 39.0–52.0)
Hemoglobin: 11.4 g/dL — ABNORMAL LOW (ref 13.0–17.0)
Immature Granulocytes: 0 %
Lymphocytes Relative: 26 %
Lymphs Abs: 1.6 10*3/uL (ref 0.7–4.0)
MCH: 30.9 pg (ref 26.0–34.0)
MCHC: 33.3 g/dL (ref 30.0–36.0)
MCV: 92.7 fL (ref 80.0–100.0)
Monocytes Absolute: 0.6 10*3/uL (ref 0.1–1.0)
Monocytes Relative: 10 %
Neutro Abs: 4 10*3/uL (ref 1.7–7.7)
Neutrophils Relative %: 62 %
Platelet Count: 352 10*3/uL (ref 150–400)
RBC: 3.69 MIL/uL — ABNORMAL LOW (ref 4.22–5.81)
RDW: 17 % — ABNORMAL HIGH (ref 11.5–15.5)
WBC Count: 6.3 10*3/uL (ref 4.0–10.5)
nRBC: 0.3 % — ABNORMAL HIGH (ref 0.0–0.2)

## 2022-12-08 LAB — CMP (CANCER CENTER ONLY)
ALT: 7 U/L (ref 0–44)
AST: 10 U/L — ABNORMAL LOW (ref 15–41)
Albumin: 3.4 g/dL — ABNORMAL LOW (ref 3.5–5.0)
Alkaline Phosphatase: 72 U/L (ref 38–126)
Anion gap: 7 (ref 5–15)
BUN: 15 mg/dL (ref 8–23)
CO2: 24 mmol/L (ref 22–32)
Calcium: 9.1 mg/dL (ref 8.9–10.3)
Chloride: 108 mmol/L (ref 98–111)
Creatinine: 0.81 mg/dL (ref 0.61–1.24)
GFR, Estimated: >60 mL/min (ref >60)
Glucose, Bld: 113 mg/dL — ABNORMAL HIGH (ref 70–99)
Potassium: 3.7 mmol/L (ref 3.5–5.1)
Sodium: 139 mmol/L (ref 135–145)
Total Bilirubin: 0.5 mg/dL (ref 0.3–1.2)
Total Protein: 6.9 g/dL (ref 6.5–8.1)

## 2022-12-08 MED ORDER — SODIUM CHLORIDE 0.9 % IV SOLN
1500.0000 mg | Freq: Once | INTRAVENOUS | Status: DC
Start: 2022-12-08 — End: 2022-12-08

## 2022-12-08 MED ORDER — SODIUM CHLORIDE 0.9 % IV SOLN: Freq: Once | INTRAVENOUS | Status: AC

## 2022-12-08 NOTE — Progress Notes (Signed)
[facility] [phone]   [phone]  OFFICE PROGRESS NOTE  [name], PA-C [address] [place] [address]  DIAGNOSIS:  Stage IIIB (T3, N2, M0) non-small cell lung cancer, squamous cell carcinoma presented with large left lower lobe lung mass in addition subcarinal and AP window nodal metastases. Diagnosed in September 2023.      PRIOR THERAPY:  1) Concurrent chemo/radiation with carboplatin for an AUC of 2 and paclitaxel 45 mg/m2 weekly. First dose expected 01/10/22. Status post 7 cycles.  Last dose was giving February 15, 2022 with partial response. 2) Consolidation treatment with immunotherapy with Imfinzi 1500 Mg IV every 4 weeks.  First dose March 31, 2022.  Status post 6 cycles.  This treatment was discontinued secondary to suspicious immunotherapy mediated pneumonitis.   CURRENT THERAPY: Observation.   INTERVAL HISTORY: Cameron Roman 72 y.o. male returns to the clinic today for follow-up visit.Discussed the use of AI scribe software for clinical note transcription with the patient, who gave verbal consent to proceed.  History of Present Illness   Cameron Roman, a 72 year-old white male with a history of Stage 3b squamous cell carcinoma of the lung diagnosed in September 2023, presents with persistent dyspnea and fatigue. Despite the use of an adjustable bed, the patient reports nocturnal dyspnea, waking up unable to catch his breath. This symptom has remained unchanged since the last visit and has not shown any improvement.  The patient also reports extreme fatigue, to the point of spending entire days in bed. He describes a feeling of exhaustion and a lack of desire to get up. The patient denies any flu-like symptoms or fever but reports persistent back pain. Over-the-counter Tylenol and prescribed hydrocodone have not provided relief. The patient describes the pain as if he's been beaten.  The patient was previously treated with  chemo and radiation with carboplatin and paclitaxel, showing a good response. He was then put on Imfinzi (1500mg  every 4 weeks) for six cycles. However, the treatment was stopped due to the development of immune-related pneumonitis.  The patient also has a history of emphysema and has not been followed up by a pulmonologist since his cancer treatment. He expresses frustration over the lack of follow-up from the pulmonologist.  In addition to his lung cancer and emphysema, the patient had an MRI of the abdomen to evaluate small enhancing lesion within the inferior right lobe of liver measuring 1.7 cm. Indeterminate by his primary care physician.        MEDICAL HISTORY: Past Medical History:  Diagnosis Date   Anxiety    Arthritis    Atrial fibrillation (HCC)    Chronic back pain    buldging disc   Chronic neck pain    ruptured disc   Cough    hx smoking   Diverticulosis    Esophageal stricture    GERD (gastroesophageal reflux disease)    takes Prilosec daily   H/O hiatal hernia    Hearing loss    Hepatitis-C 2017   treated and cured per patient   Hiatal hernia    History of colon polyps    History of radiation therapy    Left Lung- 01/10/22-02/18/22- Dr. Antony Blackbird   Insomnia    takes Elavil nightly   Lipoma of abdominal wall 02/11/2011   Lung cancer (HCC)    Stroke (HCC)    x 2   Type 2 diabetes mellitus (HCC) 07/22/2020   patient denies  DM, per MD note pt is pre-diabetic    ALLERGIES:  is allergic to percocet [oxycodone-acetaminophen].  MEDICATIONS:  Current Outpatient Medications  Medication Sig Dispense Refill   durvalumab 1,500 mg in sodium chloride 0.9 % 100 mL Inject 1,500 mg into the vein every 28 (twenty-eight) days.     ELIQUIS 5 MG TABS tablet TAKE ONE TABLET BY MOUTH TWICE DAILY 60 tablet 5   fluticasone (FLONASE) 50 MCG/ACT nasal spray Place 1 spray into the nose in the morning.     gabapentin (NEURONTIN) 300 MG capsule Take 1 capsule (300 mg total) by  mouth 3 (three) times daily. Take one capsule twice to three times daily (Patient taking differently: Take 300 mg by mouth 2 (two) times daily.) 270 capsule 3   HYDROcodone-acetaminophen (NORCO) 10-325 MG tablet Take 1 tablet by mouth 3 (three) times daily.     Omega-3 Fatty Acids (FISH OIL) 1000 MG CAPS Take 1,000 mg by mouth in the morning and at bedtime.     omeprazole (PRILOSEC) 20 MG capsule Take 1 capsule (20 mg total) by mouth daily. 90 capsule 3   pantoprazole (PROTONIX) 40 MG tablet TAKE ONE (1) TABLET BY MOUTH EVERY DAY 90 tablet 3   pravastatin (PRAVACHOL) 20 MG tablet TAKE ONE (1) TABLET BY MOUTH EVERY DAY 30 tablet 0   predniSONE (DELTASONE) 10 MG tablet 6 tablet p.o. every day for 1 week followed by 5 tablet p.o. daily for 1 week followed by 4 tablets p.o. daily for 1 week followed by 3 tablet p.o. daily for 1 week followed by 2 tablet p.o. daily for 1 week followed by 1 tablet p.o. daily for 1 week. 150 tablet 0   prochlorperazine (COMPAZINE) 10 MG tablet Take 1 tablet (10 mg total) by mouth every 6 (six) hours as needed for nausea or vomiting. (Patient not taking: Reported on 05/26/2022) 30 tablet 1   sucralfate (CARAFATE) 1 g tablet Take 1 g by mouth 4 (four) times daily.     tamsulosin (FLOMAX) 0.4 MG CAPS capsule Take 1 capsule (0.4 mg total) by mouth daily. (Patient not taking: Reported on 07/05/2022) 90 capsule 3   Current Facility-Administered Medications  Medication Dose Route Frequency Provider Last Rate Last Admin   methylPREDNISolone acetate (DEPO-MEDROL) injection 80 mg  80 mg Intramuscular Once Dettinger, Elige Radon, MD        SURGICAL HISTORY:  Past Surgical History:  Procedure Laterality Date   ANTERIOR CERVICAL DECOMP/DISCECTOMY FUSION  04/12/2011   Procedure: ANTERIOR CERVICAL DECOMPRESSION/DISCECTOMY FUSION 2 LEVELS;  Surgeon: Dorian Heckle, MD;  Location: [facility];  Service: Neurosurgery;  Laterality: N/A;  exploration of Cervical four - seven  fusion with redo  Cervical six- seven, cervical three- four anterior cervical decompression with fusion interbody prothesis plating and bonegraft and C34 anterior cervical decompression with inte   APPENDECTOMY     at age 61   BRONCHIAL BIOPSY  11/30/2021   Procedure: BRONCHIAL BIOPSIES;  Surgeon: Josephine Igo, DO;  Location: [facility] ENDOSCOPY;  Service: Pulmonary;;   BRONCHIAL BRUSHINGS  11/30/2021   Procedure: BRONCHIAL BRUSHINGS;  Surgeon: Josephine Igo, DO;  Location: [facility] ENDOSCOPY;  Service: Pulmonary;;   CARDIAC CATHETERIZATION  06/03/2004   COLONOSCOPY     EP IMPLANTABLE DEVICE N/A 09/14/2015   Procedure: Loop Recorder Insertion;  Surgeon: Marinus Maw, MD;  Location: [facility] IN[facility]AS[facility] CV [facility];  Service: Cardiovascular;  Laterality: N/A;   FINE NEEDLE ASPIRATION  11/30/2021   Procedure: FINE NEEDLE ASPIRATION (FNA)  LINEAR;  Surgeon: Josephine Igo, DO;  Location: [facility] ENDOSCOPY;  Service: Pulmonary;;   HAND SURGERY Right 03/15/2003   right x 3   INNER EAR SURGERY Bilateral    bil;titanium in both ears   lower back surgery  03/15/2007   had plates and screws inserted   NECK SURGERY  03/15/2007   had insertion of  plates and screws   TEE WITHOUT CARDIOVERSION N/A 09/14/2015   Procedure: TRANSESOPHAGEAL ECHOCARDIOGRAM (TEE);  Surgeon: Vesta Mixer, MD;  Location: [facility] ENDOSCOPY;  Service: Cardiovascular;  Laterality: N/A;   TRANSFORAMINAL LUMBAR INTERBODY FUSION (TLIF) WITH PEDICLE SCREW FIXATION 1 LEVEL Left 09/27/2019   Procedure: Left Lumbar 5 Sacral 1 Transforaminal lumbar interbody fusion with exploration/removal of adjacent level hardware;  Surgeon: Maeola Harman, MD;  Location: [facility] OR;  Service: Neurosurgery;  Laterality: Left;  3C/RM 19   VIDEO BRONCHOSCOPY WITH ENDOBRONCHIAL ULTRASOUND N/A 11/30/2021   Procedure: VIDEO BRONCHOSCOPY WITH ENDOBRONCHIAL ULTRASOUND;  Surgeon: Josephine Igo, DO;  Location: [facility] ENDOSCOPY;  Service: Pulmonary;  Laterality: N/A;    REVIEW OF SYSTEMS:  Constitutional:  positive for fatigue Eyes: negative Ears, nose, mouth, throat, and face: negative Respiratory: positive for dyspnea on exertion and wheezing Cardiovascular: negative Gastrointestinal: negative Genitourinary:negative Integument/breast: negative Hematologic/lymphatic: negative Musculoskeletal:positive for arthralgias Neurological: negative Behavioral/Psych: negative Endocrine: negative Allergic/Immunologic: negative   PHYSICAL EXAMINATION: General appearance: alert, cooperative, fatigued, and no distress Head: Normocephalic, without obvious abnormality, atraumatic Neck: no adenopathy, no JVD, supple, symmetrical, trachea midline, and thyroid not enlarged, symmetric, no tenderness/mass/nodules Lymph nodes: Cervical, supraclavicular, and axillary nodes normal. Resp: clear to auscultation bilaterally Back: symmetric, no curvature. ROM normal. No CVA tenderness. Cardio: regular rate and rhythm, S1, S2 normal, no murmur, click, rub or gallop GI: soft, non-tender; bowel sounds normal; no masses,  no organomegaly Extremities: extremities normal, atraumatic, no cyanosis or edema Neurologic: Alert and oriented X 3, normal strength and tone. Normal symmetric reflexes. Normal coordination and gait  ECOG PERFORMANCE STATUS: 1 - Symptomatic but completely ambulatory  Blood pressure 105/67, pulse 69, temperature 98.5 F (36.9 C), temperature source Oral, resp. rate 16, height 5\' 11"  (1.803 m), weight 148 lb 9.6 oz (67.4 kg), SpO2 95%.  LABORATORY DATA: Lab Results  Component Value Date   WBC 6.3 12/08/2022   HGB 11.4 (L) 12/08/2022   HCT 34.2 (L) 12/08/2022   MCV 92.7 12/08/2022   PLT 352 12/08/2022      Chemistry      Component Value Date/Time   NA 137 11/17/2022 0935   NA 140 08/30/2021 1659   K 4.4 11/17/2022 0935   CL 104 11/17/2022 0935   CO2 28 11/17/2022 0935   BUN 18 11/17/2022 0935   BUN 17 08/30/2021 1659   CREATININE 0.99 11/17/2022 0935   CREATININE 0.80 08/30/2012  0955      Component Value Date/Time   CALCIUM 9.2 11/17/2022 0935   ALKPHOS 64 11/17/2022 0935   AST 12 (L) 11/17/2022 0935   ALT 10 11/17/2022 0935   BILITOT 0.4 11/17/2022 0935       RADIOGRAPHIC STUDIES: CT Chest W Contrast  Result Date: 11/22/2022 CLINICAL DATA:  Non-small-cell lung cancer staging. * Tracking Code: BO * EXAM: CT CHEST WITH CONTRAST TECHNIQUE: Multidetector CT imaging of the chest was performed during intravenous contrast administration. RADIATION DOSE REDUCTION: This exam was performed according to the departmental dose-optimization program which includes automated exposure control, adjustment of the mA and/or kV according to patient size and/or use of iterative reconstruction technique.  CONTRAST:  75mL OMNIPAQUE IOHEXOL 300 MG/ML  SOLN COMPARISON:  CT 10/02/2022 and older FINDINGS: Cardiovascular: Heart is nonenlarged. Trace pericardial fluid. Coronary artery calcifications are seen. There is diffuse partially calcified atherosclerotic plaque along the thoracic aorta. Significant plaque along the great vessels with mild-to-moderate stenoses suggested of the brachiocephalic artery in the left common carotid artery. There is also significant stenosis suggested of the origin of the right subclavian artery. Please correlate for any clinical symptoms including subclavian steal. Mediastinum/Nodes: Normal caliber thoracic esophagus which is slightly patulous. Small thyroid gland. No specific abnormal lymph node enlargement identified in the axillary regions, right hilum. There are several small mediastinal nodes identified, nonpathologic by size criteria and similar to previous. Soft tissue thickening in the left hilum. Lungs/Pleura: There is thickening of the tracheobronchial tree particularly towards the left hilum. Once again there is a mass lesion immediately posterior to the left hilum which previously was measured at 4.3 x 2.9 cm on previous exam and today 4.0 by 3.2 cm on series  2, image 82 when measured in the same fashion. There is also abnormal tissue extending more caudal. Cephalocaudal length was measured at proximally 6.1 cm previously and today on sagittal imaging 6.4 cm. Overall lesion is relatively similar when adjusting for technique. There is also opacification along adjacent left lower lobe bronchi extending caudal. Distortion of other bronchi seen left lower lobe centrally. Advanced emphysematous lung changes identified with apical pleural thickening. No pneumothorax or effusion. Extensive interstitial changes. Upper Abdomen: Adrenal glands are preserved in the upper abdomen. There is nodular contours of the liver. Previous liver lesion is not included in the imaging field today. Please see separate MRI of the abdomen. Musculoskeletal: Osteopenia. Scattered degenerative changes of the spine. Fixation hardware along the lower cervical spine at the edge of the imaging field. IMPRESSION: Stable left-sided lower lobe perihilar soft tissue mass with thickening at the left hilum. No new developing lymph node enlargement identified or other new mass lesion. Advanced emphysematous lung changes. Multifocal atherosclerotic changes along the aorta and great vessels with areas of significant stenosis seen particularly of the right subclavian artery origin but also the brachiocephalic artery and the left common carotid artery. Please correlate with particular symptoms and further workup when clinically appropriate Aortic Atherosclerosis (ICD10-I70.0) and Emphysema (ICD10-J43.9). Electronically Signed   By: Karen Kays M.D.   On: 11/22/2022 17:13   MR ABDOMEN WWO CONTRAST  Result Date: 11/22/2022 CLINICAL DATA:  History of non-small-cell lung cancer. Chemotherapy and radiation. History of chronic liver disease. Liver lesion. EXAM: MRI ABDOMEN WITHOUT AND WITH CONTRAST TECHNIQUE: Multiplanar multisequence MR imaging of the abdomen was performed both before and after the administration of  intravenous contrast. CONTRAST:  7 cc from a single use bottle, 0.5 cc discarded,[employer] COMPARISON:  Ultrasound 10/27/2018. Chest CT 10/02/2022. Multiple prior ultrasound. No prior abdominal MRI FINDINGS: Lower chest: No pleural effusion seen at the lung bases. Please see separate chest CT from same day Hepatobiliary: No signal dropout on out of phase imaging in the liver parenchyma. In the area of abnormality by prior CT scan is a heterogeneous area increased enhancement in the arterial phase which persists into the portal venous phase. Less apparent on longer delays but no washout relative to adjacent hepatic parenchyma. As seen on the prior CT this measures proximally 13 mm on series 14, image 60. There is no abnormal diffusion in this location. No abnormal T2 signal. Question tiny focus of precontrast low T1 signal on series 11, image 37.  No other aggressive space-occupying liver lesion. Patent portal vein. Gallbladder is mildly distended. Slight ectasia of the biliary tree the common duct measures 7 mm. Slight ectasia of the intrahepatic ducts. There are stones in the gallbladder. There is a question of some small filling defects along the distal common duct such as coronal series 3, image 17 and axial series 4, image 25. Choledocholithiasis is not excluded. Dedicated MRCP may be of some benefit and correlation with clinical presentation. Pancreas: No mass, inflammatory changes, or other parenchymal abnormality identified. No pancreatic ductal dilatation. Spleen:  Spleen is quite small.  Preserved enhancement. Adrenals/Urinary Tract: Adrenal glands are unremarkable. No enhancing renal mass or collecting system dilatation. Mild ectasia of calices of the left kidney, nonspecific. Stomach/Bowel: Visualized portions within the abdomen are unremarkable. Vascular/Lymphatic: No pathologically enlarged lymph nodes identified. No abdominal aortic aneurysm demonstrated. Other:  None. Musculoskeletal: Degenerative changes  along the spine. There is susceptibility artifact related to fixation hardware at L5-S1. IMPRESSION: Slight nodular contours of the liver. No ascites, splenomegaly, varices. Patent portal vein. In the area of concern of the prior contrast CT is a subtle area of nodular enhancement without aggressive features seen in segment 6 of the liver. Favor more a benign type lesion. No capsule, washout. No clear signs of growth. Liver rads category 3. Attention on follow-up surveillance. Slight ectasia of the biliary tree. There are some stones in the gallbladder. Is also suggestion of some subtle filling defects along distal common duct. Choledocholithiasis possible. Please correlate with clinical presentation. A true dedicated MRCP may be of some benefit for higher sensitivity and resolution. Findings will be called to the ordering service by the Radiology physician assistant team Electronically Signed   By: Karen Kays M.D.   On: 11/22/2022 17:05    ASSESSMENT AND PLAN:  This is a very pleasant 72 years old white male diagnosed with a stage IIIb (T3, N2, M0) non-small cell lung cancer, squamous cell carcinoma presented with large left lower lobe lung mass in addition to subcarinal and AP window lymphadenopathy diagnosed in September 2023.  The patient underwent a course of concurrent chemoradiation with weekly carboplatin and paclitaxel status post 7 cycles of the chemotherapy.  He tolerated this treatment well with no concerning adverse effects except for the radiation-induced esophagitis and odynophagia.  He is recovering well from his treatment. He is currently undergoing a course of consolidation treatment with immunotherapy with Imfinzi 1500 Mg IV every 4 weeks status post 6 cycles.   The patient has been tolerating this treatment well but has been complaining of increasing fatigue and weakness as well as weight loss and worsening dyspnea at baseline increased with exertion.  He had repeat CT scan of the chest  performed recently.  I personally and independently reviewed the scan images and discussed the results with the patient today.  His scan showed interval increase in size of posterior perihilar soft tissue mass within the left lung concerning for disease.  Recurrence but he also has a lot of postradiation changes in that area concerning for interstitial lung disease or hypersensitivity pneumonitis.  His treatment was discontinued. He was treated with a tapered dose of prednisone for the next 6 weeks.      Stage 3b Squamous Cell Carcinoma of the Lung Stable disease on recent imaging. Immune therapy (Imfinzi 1500mg  every 4 weeks) was stopped due to immune-mediated pneumonitis. -Plan to continue surveillance with repeat imaging in 3 months.  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 -No further action  required at this time.  Chronic Obstructive Pulmonary Disease (COPD) Persistent dyspnea despite use of adjustable bed. -Recommend follow-up with pulmonologist for potential adjustment of COPD management.  Generalized Body Pain Persistent despite use of Tylenol and Hydrocodone. -No change in management discussed.  Fatigue Severe, leading to decreased activity and increased time in bed. -Encouraged to stay active to combat fatigue.  Abdominal Imaging MRI performed by primary care physician due to concern for hepatic lesion. Results reportedly normal. -No further action required at this time.   The patient was advised to call immediately if he has any concerning symptoms in the interval.  The patient voices understanding of current disease status and treatment options and is in agreement with the current care plan.  All questions were answered. The patient knows to call the clinic with any problems, questions or concerns. We can certainly see the patient much sooner if necessary.  The total time spent in the appointment was 30 minutes.  Disclaimer: This note was  dictated with voice recognition software. Similar sounding words can inadvertently be transcribed and may not be corrected upon review.

## 2022-12-12 ENCOUNTER — Other Ambulatory Visit: Payer: Self-pay | Admitting: Internal Medicine

## 2022-12-12 NOTE — Telephone Encounter (Signed)
Prescription refill request for Eliquis received. Indication: PAF Last office visit: 07/05/22  Rosette Reveal MD Scr: 0.81 on 12/08/22  Epic Age: 72 Weight: 66.8kg  Based on above findings Eliquis 5mg  twice daily is the appropriate dose.  Refill approved.

## 2022-12-19 DIAGNOSIS — R3915 Urgency of urination: Secondary | ICD-10-CM | POA: Diagnosis not present

## 2022-12-20 NOTE — Progress Notes (Deleted)
Cardiology Office Note:  .   Date:  12/20/2022  ID:  Cameron Roman, DOB 04-06-1950, MRN [id number] PCP: [name]  [facility] Providers Cardiologist:  None Electrophysiologist:  Lewayne Bunting, MD {  History of Present Illness: .   Cameron Roman is a 72 y.o. male w/PMHx of cryptogenic stroke > loop > AFib, DM, lung Ca, epmhysema  Diagnosed with Stage IIIB (T3, N2, M0) non-small cell lung cancer, squamous cell carcinoma presented with large left lower lobe lung mass in addition subcarinal and AP window nodal metastases.  1) Concurrent chemo/radiation with carboplatin for an AUC of 2 and paclitaxel 45 mg/m2 weekly. First dose expected 01/10/22. Status post 7 cycles.  Last dose was giving February 15, 2022 with partial response. 2) Consolidation treatment with immunotherapy with Imfinzi 1500 Mg IV every 4 weeks.  First dose March 31, 2022.  Status post 6 cycles.  This treatment was discontinued secondary to suspicious immunotherapy mediated pneumonitis.   Saw Dr. 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 Gwenyth Bouillon, 12/08/22, reported extreme fatigue, spending days in bed, no symptoms of febrile illness, c//o back pain currently undergoing a course of consolidation treatment with immunotherapy with Imfinzi 1500 Mg IV every 4 weeks   CT scan of the chest performed recently.  I personally and independently reviewed the scan images and discussed the results with the patient today.  His scan showed interval increase in size of posterior perihilar soft tissue mass within the left lung concerning for disease.  Recurrence but he also has a lot of postradiation changes in that area concerning for interstitial lung disease or hypersensitivity pneumonitis.  His treatment was discontinued. He was treated with a tapered dose of prednisone for the next 6 weeks.   Advised to  stay active to try and combat fatigue  Labs this day  K+ 3.7 BUN/Creat 15/0.81 WBC 6.3 H/H 11/34 Plts 352  Today's visit is scheduled to evaluate c/o fatigue, sleepy, heart feels funny  ROS: ***  *** symptoms *** AF burden *** loop out? *** 2/2 Ca tx?? *** eliquis, dose, bleeding, labs ok   Device information MDT LINQ II implanted 09/14/2015 >> EOS  Arrhythmia/AAD hx AFib found 2018 No AAD to date that I find  Studies Reviewed: Marland Kitchen    EKG done today and reviewed by myself:  ***   09/14/2015: TEE Study Conclusions  - Procedure narrative: Transesophageal echocardiography. An adult    multiplane transesophageal probe was inserted by the attending    cardiologistwithout difficulty. Image quality was poor. This was    a technically difficult study.  - Left ventricle: Systolic function was normal. The estimated    ejection fraction was in the range of 60% to 65%.  - Aortic valve: No evidence of vegetation.  - Left atrium: No evidence of thrombus in the atrial cavity or    appendage.  - Atrial septum: No defect or patent foramen ovale was identified.    Risk Assessment/Calculations:    Physical Exam:   VS:  There were no vitals taken for this visit.   Wt Readings from Last 3 Encounters:  12/08/22 148 lb 9.6 oz (67.4 kg)  10/06/22 138 lb 9.6 oz (62.9 kg)  09/08/22 139 lb (63 kg)    GEN: Well nourished, well developed in no acute distress NECK: No JVD; No carotid bruits CARDIAC: ***RRR, no murmurs, rubs, gallops RESPIRATORY:  ***  CTA b/l without rales, wheezing or rhonchi  ABDOMEN: Soft, non-tender, non-distended EXTREMITIES:  No edema; No deformity   PPM/ICD/ILR site: *** is stable, no thinning, fluctuation, tethering  ASSESSMENT AND PLAN: .    paroxysmal AFib CHA2DS2Vasc is 4, on Eliquis, *** appropriately dosed *** burden by symptoms  *** ***   Secondary hypercoagulable state 2/2 AFib     {Are you ordering a CV Procedure (e.g. stress test, cath,  DCCV, TEE, etc)?   Press F2        :[id number]}     Dispo: ***  Signed, Sheilah Pigeon, PA-C

## 2022-12-21 ENCOUNTER — Ambulatory Visit: Payer: Medicare Other | Admitting: Pulmonary Disease

## 2022-12-21 ENCOUNTER — Ambulatory Visit: Payer: Medicare Other | Admitting: Physician Assistant

## 2022-12-21 ENCOUNTER — Encounter: Payer: Self-pay | Admitting: Pulmonary Disease

## 2022-12-21 VITALS — BP 108/64 | HR 90 | Temp 97.7°F | Ht 70.0 in | Wt 146.6 lb

## 2022-12-21 DIAGNOSIS — G4719 Other hypersomnia: Secondary | ICD-10-CM

## 2022-12-21 DIAGNOSIS — R0609 Other forms of dyspnea: Secondary | ICD-10-CM

## 2022-12-21 MED ORDER — TRELEGY ELLIPTA 100-62.5-25 MCG/ACT IN AEPB: 1.0000 | INHALATION_SPRAY | Freq: Every day | RESPIRATORY_TRACT | 0 refills | Status: DC

## 2022-12-21 MED ORDER — TRELEGY ELLIPTA 100-62.5-25 MCG/ACT IN AEPB: 1.0000 | INHALATION_SPRAY | Freq: Every day | RESPIRATORY_TRACT | 11 refills | Status: DC

## 2022-12-21 NOTE — Progress Notes (Unsigned)
@Patient  ID: Cameron Roman, male    DOB: [birth year], 72 y.o.   MRN: [id number]  Chief Complaint  Patient presents with  . Acute Visit    Referring provider: [name]  HPI:   72 y.o. man initial consultation for lung mass status post biopsy and SBRT and chemotherapy and few weeks of immunotherapy stopped after concern for pneumonitis here with chief complaint of dyspnea on exertion and excessive daytime sleepiness.  Multiple oncology notes reviewed.  Patient seen over a year ago.  Referred to me for lung mass.  I sent in for biopsy.  Squamous cell.  Stage IIIb.  Underwent chemoradiation completed December 2023.  Consolidation treatment with immunotherapy January 2024 completed 6 cycles, subsequently stopped for concern for pneumonitis.  Serial scans demonstrate stability.  He reports dyspnea on exertion.  Present for many months now.  Really since completing radiation.  No time of day when things are better or worse maybe a little bit worse position lying down but overall not really sure.  No seasonal or environmental factors are made things better or worse.  Worse with carrying objects etc.  Not on any inhalers.  Reviewed CT scan which shows some radiation changes and fibrotic changes around the mass, possible restriction related to this driving dyspnea.  Possible smoking-related lung disease.  Also has a productive cough on review of systems.  Also endorses excessive daytime sleepiness.  Has fallen asleep driving, sitting in chair etc.  Fall asleep a lot during the day.  HPI initial visit: Unclear reason why a chest x-ray is obtained 09/02/2021.  Recent shortness of breath.  He denies any respiratory symptoms at that time.  Denies any respiratory symptoms ever.  No dyspnea etc.  No cough etc.  Regardless chest x-ray is obtained.  Per report that showed left-sided spiculated nodule versus mass.  This prompted CT scan 4 days later with contrast of the chest.  On my review of results  this reports up to 5 cm spiculated left lower lobe near the superior segment mass with extension towards the hilum.  No lymphadenopathy was noted.  This prompted referral to the [place] system.  He met with oncology 09/2021.  Recommended biopsy referral to pulmonary as well as PET scan.  PET scan has not been performed.  He has not followed up.  He is just now seeing [place] for consideration of biopsy.  He shares his wife died from lung cancer 3 years ago.  Stage IV small cell.  He was everywhere.  At time of diagnosis.  He understands lung cancer.  We discussed at length the role and rationale for biopsy.  He expressed understanding is amenable to this.  Case discussed with Dr. Tonia Brooms today.  We established a timeframe for repeat CT scan as well as biopsy in the coming week or 2.  Being scheduled today for later in August.  PMH: Tobacco abuse in remission, atrial fibrillation on Eliquis, cirrhosis Surgical history: Cervical spine surgery, appendectomy, hand surgery, Family history: Father with colon cancer, mother with perforated bowel Social history: Former smoker, 41-pack-year, quit 2002    Questionaires / Pulmonary Flowsheets:   ACT:      No data to display          MMRC:     No data to display          Epworth:      No data to display          Tests:   FENO:  No results found for: "NITRICOXIDE"  PFT:     No data to display          WALK:      No data to display          Imaging: Personally reviewed No results found.  Lab Results: Personally reviewed CBC    Component Value Date/Time   WBC 6.3 12/08/2022 1314   WBC 12.6 (H) 11/30/2021 1150   RBC 3.69 (L) 12/08/2022 1314   HGB 11.4 (L) 12/08/2022 1314   HGB 11.1 (L) 08/30/2021 1659   HCT 34.2 (L) 12/08/2022 1314   HCT 32.5 (L) 08/30/2021 1659   PLT 352 12/08/2022 1314   PLT 361 08/30/2021 1659   MCV 92.7 12/08/2022 1314   MCV 92 08/30/2021 1659   MCH 30.9 12/08/2022 1314   MCHC 33.3 12/08/2022  1314   RDW 17.0 (H) 12/08/2022 1314   RDW 13.8 08/30/2021 1659   LYMPHSABS 1.6 12/08/2022 1314   LYMPHSABS 5.3 (H) 08/30/2021 1659   MONOABS 0.6 12/08/2022 1314   EOSABS 0.1 12/08/2022 1314   EOSABS 0.2 08/30/2021 1659   BASOSABS 0.0 12/08/2022 1314   BASOSABS 0.1 08/30/2021 1659    BMET    Component Value Date/Time   NA 139 12/08/2022 1314   NA 140 08/30/2021 1659   K 3.7 12/08/2022 1314   CL 108 12/08/2022 1314   CO2 24 12/08/2022 1314   GLUCOSE 113 (H) 12/08/2022 1314   BUN 15 12/08/2022 1314   BUN 17 08/30/2021 1659   CREATININE 0.81 12/08/2022 1314   CREATININE 0.80 08/30/2012 0955   CALCIUM 9.1 12/08/2022 1314   GFRNONAA >60 12/08/2022 1314   GFRNONAA >89 08/30/2012 0955   GFRAA 106 11/21/2019 0847   GFRAA >89 08/30/2012 0955    BNP No results found for: "BNP"  ProBNP No results found for: "PROBNP"  Specialty Problems       Pulmonary Problems   HIATAL HERNIA    Qualifier: Diagnosis of  By: Dorian Pod, Pam   Replacing diagnoses that were inactivated after the 06/13/22 regulatory import      Lung mass   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 Percocet [Oxycodone-Acetaminophen] Itching    Immunization History  Administered Date(s) Administered  . Fluad Quad(high Dose 65+) 01/18/2019, 02/01/2021  . Influenza, High Dose Seasonal PF 01/10/2018  . Influenza,inj,Quad PF,6+ Mos 12/11/2012, 01/21/2014, 02/17/2015, 12/23/2015, 01/06/2017  . Moderna Sars-Covid-2 Vaccination 07/12/2019, 08/15/2019  . Zoster, Live 03/12/2013    Past Medical History:  Diagnosis Date  . Anxiety   . Arthritis   . Atrial fibrillation (HCC)   . Chronic back pain    buldging disc  . Chronic neck pain    ruptured disc  . Cough    hx smoking  . Diverticulosis   . Esophageal stricture   . GERD (gastroesophageal reflux disease)    takes Prilosec daily  . H/O hiatal hernia   . Hearing loss   .  Hepatitis-C 2017   treated and cured per patient  . Hiatal hernia   . History of colon polyps   . History of radiation therapy    Left Lung- 01/10/22-02/18/22- Dr. Antony Blackbird  . Insomnia    takes Elavil nightly  . 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 Lung cancer (HCC)   . Stroke (HCC)    x 2  . Type 2 diabetes mellitus (HCC) 07/22/2020   patient  denies DM, per MD note pt is pre-diabetic    Tobacco History: Social History   Tobacco Use  Smoking Status Former  . Current packs/day: 0.00  . Average packs/day: 1 pack/day for 41.0 years (41.0 ttl pk-yrs)  . Types: Cigarettes  . Start date: 06/28/1961  . Quit date: 06/29/2002  . Years since quitting: 20.4  Smokeless Tobacco Never  Tobacco Comments   Pt quit smoking in 2004. 10/21/21   Counseling given: Not Answered Tobacco comments: Pt quit smoking in 2004. 10/21/21   Continue to not smoke  Outpatient Encounter Medications as of 12/21/2022  Medication Sig  . durvalumab 1,500 mg in sodium chloride 0.9 % 100 mL Inject 1,500 mg into the vein every 28 (twenty-eight) days.  Marland Kitchen ELIQUIS 5 MG TABS tablet TAKE ONE TABLET BY MOUTH TWICE DAILY  . fluticasone (FLONASE) 50 MCG/ACT nasal spray Place 1 spray into the nose in the morning.  . Fluticasone-Umeclidin-Vilant (TRELEGY ELLIPTA) 100-62.5-25 MCG/ACT AEPB Inhale 1 puff into the lungs daily.  Marland Kitchen gabapentin (NEURONTIN) 300 MG capsule Take 1 capsule (300 mg total) by mouth 3 (three) times daily. Take one capsule twice to three times daily (Patient taking differently: Take 300 mg by mouth 2 (two) times daily.)  . HYDROcodone-acetaminophen (NORCO) 10-325 MG tablet Take 1 tablet by mouth 3 (three) times daily.  . Omega-3 Fatty Acids (FISH OIL) 1000 MG CAPS Take 1,000 mg by mouth in the morning and at bedtime.  Marland Kitchen omeprazole (PRILOSEC) 20 MG capsule Take 1 capsule (20 mg total) by mouth daily.  . pantoprazole (PROTONIX) 40 MG tablet TAKE ONE (1) TABLET BY MOUTH EVERY DAY  . pravastatin  (PRAVACHOL) 20 MG tablet TAKE ONE (1) TABLET BY MOUTH EVERY DAY  . prochlorperazine (COMPAZINE) 10 MG tablet Take 1 tablet (10 mg total) by mouth every 6 (six) hours as needed for nausea or vomiting.  . sucralfate (CARAFATE) 1 g tablet Take 1 g by mouth 4 (four) times daily.  . tamsulosin (FLOMAX) 0.4 MG CAPS capsule Take 1 capsule (0.4 mg total) by mouth daily.   Facility-Administered Encounter Medications as of 12/21/2022  Medication  . methylPREDNISolone acetate (DEPO-MEDROL) injection 80 mg     Review of Systems  Review of Systems  No chest pain with exertion.  No orthopnea or PND.  Cough as discussed above, mildly productive.  Comprehensive review of systems otherwise negative. Physical Exam  BP 108/64 (BP Location: Right Arm, Patient Position: Sitting, Cuff Size: Normal)   Pulse 90   Temp 97.7 F (36.5 C) (Oral)   Ht 5\' 10"  (1.778 m)   Wt 146 lb 9.6 oz (66.5 kg)   SpO2 95%   BMI 21.03 kg/m   Wt Readings from Last 5 Encounters:  12/21/22 146 lb 9.6 oz (66.5 kg)  12/08/22 148 lb 9.6 oz (67.4 kg)  10/06/22 138 lb 9.6 oz (62.9 kg)  09/08/22 139 lb (63 kg)  08/11/22 145 lb (65.8 kg)    BMI Readings from Last 5 Encounters:  12/21/22 21.03 kg/m  12/08/22 20.73 kg/m  10/06/22 19.33 kg/m  09/08/22 19.39 kg/m  08/11/22 20.22 kg/m     Physical Exam General: Well-appearing, no acute distress Eyes: EOMI, no icterus Neck: Supple, no JVP Pulmonary: Clear, normal work of breathing, distant Cardiovascular: Warm, no edema Abdomen: Nondistended, bowel sounds present MSK: No synovitis, no joint effusion Neuro: Normal gait, no weakness Psych: Normal mood, full affect   Assessment & Plan:   Squamous cell carcinoma: 5 cm mass left lower lobe.  Biopsied.  Status post chemoradiation.  Consolidation therapy with immunotherapy stopped due to pneumonitis.  Stable on scans.  Continue oncology follow-up.  Dyspnea exertion: Since radiation therapy.  Possible abdominal  restriction after radiation.  Also possible lung related smoking disease.  With cough this is favored.  Trial low-dose Trelegy.  Excessive daytime sleepiness: Falling asleep driving sitting in chair etc.  Home sleep test ordered to evaluate for OSA.  HCM: Flu shot today.   Return in about 3 months (around 03/23/2023) for f/u Dr. Judeth Horn.   Karren Burly, MD 12/21/2022  I spent 40 minutes in the care of the patient including review of records, face-to-face visit, coordination of care.

## 2022-12-21 NOTE — Patient Instructions (Signed)
Nice to see you again  Use Trelegy 1 puff once a day, rinse your mouth out with water thoroughly after every use  See if this helps with the sputum production and cough as well as shortness of breath  Please discuss your shortness of breath with your cardiologist at your appointment tomorrow  For the excessive sleepiness during the day, I ordered a home sleep test to evaluate for the presence of sleep apnea.  If sleep apnea were present on the test we will recommend a CPAP machine.  We can discuss this in the future once we get the results.  Return to clinic in 3 months or sooner as needed with Dr. Judeth Horn

## 2022-12-22 ENCOUNTER — Encounter: Payer: Self-pay | Admitting: *Deleted

## 2022-12-22 ENCOUNTER — Other Ambulatory Visit: Payer: Medicare Other

## 2022-12-22 ENCOUNTER — Ambulatory Visit: Payer: Medicare Other | Admitting: Internal Medicine

## 2022-12-22 ENCOUNTER — Encounter: Payer: Self-pay | Admitting: Internal Medicine

## 2022-12-22 ENCOUNTER — Ambulatory Visit: Payer: Medicare Other | Attending: Physician Assistant | Admitting: Internal Medicine

## 2022-12-22 ENCOUNTER — Ambulatory Visit: Payer: Medicare Other

## 2022-12-22 VITALS — BP 116/68 | HR 138 | Ht 69.0 in | Wt 149.0 lb

## 2022-12-22 DIAGNOSIS — I480 Paroxysmal atrial fibrillation: Secondary | ICD-10-CM | POA: Diagnosis not present

## 2022-12-22 NOTE — Progress Notes (Signed)
Patient seen in the office today and instructed on use of Stiolto.  Patient expressed understanding and demonstrated technique. 

## 2022-12-22 NOTE — Patient Instructions (Addendum)
Medication Instructions:  Your physician recommends that you continue on your current medications as directed. Please refer to the Current Medication list given to you today.  *If you need a refill on your cardiac medications before your next appointment, please call your pharmacy*   Lab Work: None  If you have labs (blood work) drawn today and your tests are completely normal, you will receive your results only by: MyChart Message (if you have MyChart) OR A paper copy in the mail If you have any lab test that is abnormal or we need to change your treatment, we will call you to review the results.   Testing/Procedures: None   Follow-Up: At [facility], you and your health needs are our priority.  As part of our continuing mission to provide you with exceptional heart care, we have created designated Provider Care Teams.  These Care Teams include your primary Cardiologist (physician) and Advanced Practice Providers (APPs -  Physician Assistants and Nurse Practitioners) who all work together to provide you with the care you need, when you need it.  We recommend signing up for the patient portal called "MyChart".  Sign up information is provided on this After Visit Summary.  MyChart is used to connect with patients for Virtual Visits (Telemedicine).  Patients are able to view lab/test results, encounter notes, upcoming appointments, etc.  Non-urgent messages can be sent to your provider as well.   To learn more about what you can do with MyChart, go to [email or web address].    Your next appointment:    3 months  Provider:   Lewayne Bunting, MD    Other Instructions   .

## 2022-12-22 NOTE — Progress Notes (Signed)
HPI [name] returns today for followup of atrial fib. He is a pleasant 72 yo man with a cryptogenic stroke, s/p ILR which demonstrated atrial fib. He has been prescribed eliquis but was only taking one tab a day when I saw him last but now is taking twice daily. He notes that he has felt very poorly for the last 3 months which coincides with him receiving XRT. He does not have palpitations but has developed class 3 dyspnea. No chest pain.  Allergies  Allergen Reactions   Percocet [Oxycodone-Acetaminophen] Itching     Current Outpatient Medications  Medication Sig Dispense Refill   cyanocobalamin (VITAMIN B12) 1000 MCG/ML injection Inject into the muscle.     durvalumab 1,500 mg in sodium chloride 0.9 % 100 mL Inject 1,500 mg into the vein every 28 (twenty-eight) days.     ELIQUIS 5 MG TABS tablet TAKE ONE TABLET BY MOUTH TWICE DAILY 60 tablet 5   fluticasone (FLONASE) 50 MCG/ACT nasal spray Place 1 spray into the nose in the morning.     Fluticasone-Umeclidin-Vilant (TRELEGY ELLIPTA) 100-62.5-25 MCG/ACT AEPB Inhale 1 puff into the lungs daily. 60 each 11   Fluticasone-Umeclidin-Vilant (TRELEGY ELLIPTA) 100-62.5-25 MCG/ACT AEPB Inhale 1 puff into the lungs daily. 14 each 0   gabapentin (NEURONTIN) 300 MG capsule Take 1 capsule (300 mg total) by mouth 3 (three) times daily. Take one capsule twice to three times daily (Patient taking differently: Take 300 mg by mouth 2 (two) times daily.) 270 capsule 3   HYDROcodone-acetaminophen (NORCO) 10-325 MG tablet Take 1 tablet by mouth 3 (three) times daily.     Omega-3 Fatty Acids (FISH OIL) 1000 MG CAPS Take 1,000 mg by mouth in the morning and at bedtime.     omeprazole (PRILOSEC) 20 MG capsule Take 1 capsule (20 mg total) by mouth daily. 90 capsule 3   pantoprazole (PROTONIX) 40 MG tablet TAKE ONE (1) TABLET BY MOUTH EVERY DAY 90 tablet 3   pravastatin (PRAVACHOL) 20 MG tablet TAKE ONE (1) TABLET BY MOUTH EVERY DAY 30 tablet 0    prochlorperazine (COMPAZINE) 10 MG tablet Take 1 tablet (10 mg total) by mouth every 6 (six) hours as needed for nausea or vomiting. 30 tablet 1   tamsulosin (FLOMAX) 0.4 MG CAPS capsule Take 1 capsule (0.4 mg total) by mouth daily. 90 capsule 3   sucralfate (CARAFATE) 1 g tablet Take 1 g by mouth 4 (four) times daily. (Patient not taking: Reported on 12/22/2022)     Current Facility-Administered Medications  Medication Dose Route Frequency Provider Last Rate Last Admin   methylPREDNISolone acetate (DEPO-MEDROL) injection 80 mg  80 mg Intramuscular Once Dettinger, Elige Radon, MD         Past Medical History:  Diagnosis Date   Anxiety    Arthritis    Atrial fibrillation (HCC)    Chronic back pain    buldging disc   Chronic neck pain    ruptured disc   Cough    hx smoking   Diverticulosis    Esophageal stricture    GERD (gastroesophageal reflux disease)    takes Prilosec daily   H/O hiatal hernia    Hearing loss    Hepatitis-C 2017   treated and cured per patient   Hiatal hernia    History of colon polyps    History of radiation therapy    Left Lung- 01/10/22-02/18/22- Dr. Antony Blackbird   Insomnia    takes Elavil nightly  Lipoma of abdominal wall 02/11/2011   Lung cancer (HCC)    Stroke (HCC)    x 2   Type 2 diabetes mellitus (HCC) 07/22/2020   patient denies DM, per MD note pt is pre-diabetic    ROS:   All systems reviewed and negative except as noted in the HPI.   Past Surgical History:  Procedure Laterality Date   ANTERIOR CERVICAL DECOMP/DISCECTOMY FUSION  04/12/2011   Procedure: ANTERIOR CERVICAL DECOMPRESSION/DISCECTOMY FUSION 2 LEVELS;  Surgeon: Dorian Heckle, MD;  Location: [facility];  Service: Neurosurgery;  Laterality: N/A;  exploration of Cervical four - seven  fusion with redo Cervical six- seven, cervical three- four anterior cervical decompression with fusion interbody prothesis plating and bonegraft and C34 anterior cervical decompression with inte    APPENDECTOMY     at age 19   BRONCHIAL BIOPSY  11/30/2021   Procedure: BRONCHIAL BIOPSIES;  Surgeon: Josephine Igo, DO;  Location: [facility] ENDOSCOPY;  Service: Pulmonary;;   BRONCHIAL BRUSHINGS  11/30/2021   Procedure: BRONCHIAL BRUSHINGS;  Surgeon: Josephine Igo, DO;  Location: [facility] END[facility];  Service: Pulmonary;;   CARDIAC CATHETERIZATION  06/03/2004   COLONOSCOPY     EP IMPLANTABLE DEVICE N/A 09/14/2015   Procedure: Loop Recorder Insertion;  Surgeon: Marinus Maw, MD;  Location: [facility] INVAS[facility];  Service: Cardiovascular;  Laterality: N/A;   FINE NEEDLE ASPIRATION  11/30/2021   Procedure: FINE NEEDLE ASPIRATION (FNA) LINEAR;  Surgeon: Josephine Igo, DO;  Location: [facility] ENDOSCOPY;  Service: Pulmonary;;   HAND SURGERY Right 03/15/2003   right x 3   INNER EAR SURGERY Bilateral    bil;titanium in both ears   lower back surgery  03/15/2007   had plates and screws inserted   NECK SURGERY  03/15/2007   had insertion of  plates and screws   TEE WITHOUT CARDIOVERSION N/A 09/14/2015   Procedure: TRANSESOPHAGEAL ECHOCARDIOGRAM (TEE);  Surgeon: Vesta Mixer, MD;  Location: [facility] ENDOSCOPY;  Service: Cardiovascular;  Laterality: N/A;   TRANSFORAMINAL LUMBAR INTERBODY FUSION (TLIF) WITH PEDICLE SCREW FIXATION 1 LEVEL Left 09/27/2019   Procedure: Left Lumbar 5 Sacral 1 Transforaminal lumbar interbody fusion with exploration/removal of adjacent level hardware;  Surgeon: Maeola Harman, MD;  Location: [facility] OR;  Service: Neurosurgery;  Laterality: Left;  3C/RM 19   VIDEO BRONCHOSCOPY WITH ENDOBRONCHIAL ULTRASOUND N/A 11/30/2021   Procedure: VIDEO BRONCHOSCOPY WITH ENDOBRONCHIAL ULTRASOUND;  Surgeon: Josephine Igo, DO;  Location: [facility] ENDOSCOPY;  Service: Pulmonary;  Laterality: N/A;     Family History  Problem Relation Age of Onset   Other Mother        Perforated bowel   Cancer Father    Colon cancer Father    Cancer Brother        GI   Colon cancer Maternal Grandmother    Colon cancer  Maternal Grandfather    Healthy Daughter    Healthy Son    Heart disease Maternal Uncle    Stroke Maternal Uncle    Anesthesia problems Neg Hx    Hypotension Neg Hx    Malignant hyperthermia Neg Hx    Pseudochol deficiency Neg Hx      Social History   Socioeconomic History   Marital status: Widowed    Spouse name: Britta Mccreedy   Number of children: 2   Years of education: 8th Grade   Highest education level: 8th grade  Occupational History   Occupation: disabled    Employer: DISABLED  Tobacco Use   Smoking  status: Former    Current packs/day: 0.00    Average packs/day: 1 pack/day for 41.0 years (41.0 ttl pk-yrs)    Types: Cigarettes    Start date: 06/28/1961    Quit date: 06/29/2002    Years since quitting: 20.4   Smokeless tobacco: Never   Tobacco comments:    Pt quit smoking in 2004. 10/21/21  Vaping Use   Vaping status: Never Used  Substance and Sexual Activity   Alcohol use: Not Currently    Alcohol/week: 1.0 standard drink of alcohol    Types: 1 Cans of beer per week   Drug use: No   Sexual activity: Yes    Birth control/protection: None  Other Topics Concern   Not on file  Social History Narrative   Lives alone, his wife passed away 02/24/2019.  Has 2 children.     Currently does not work.  On disability for wrist pain since early 2000s.   Formerly a Naval architect.   Social Determinants of Health   Financial Resource Strain: Low Risk  (06/30/2022)   Received from [facility], [facility]   Overall Financial Resource Strain (CARDIA)    Difficulty of Paying Living Expenses: Not hard at all  Food Insecurity: No Food Insecurity (06/30/2022)   Received from [facility], [facility]   Hunger Vital Sign    Worried About Running Out of Food in the Last Year: Never true    Ran Out of Food in the Last Year: Never true  Transportation Needs: No Transportation Needs (06/30/2022)   Received from [facility], [facility]   PRAPARE - Transportation    Lack of  Transportation (Medical): No    Lack of Transportation (Non-Medical): No  Physical Activity: Insufficiently Active (11/19/2020)   Exercise Vital Sign    Days of Exercise per Week: 7 days    Minutes of Exercise per Session: 20 min  Stress: No Stress Concern Present (11/19/2020)   [employer] of Occupational Health - Occupational Stress Questionnaire    Feeling of Stress : Only a little  Social Connections: Unknown (09/02/2021)   Received from [facility], [facility]   Social Network    Social Network: Not on file  Intimate Partner Violence: Unknown (09/02/2021)   Received from [facility], [facility]   HITS    Physically Hurt: Not on file    Insult or Talk Down To: Not on file    Threaten Physical Harm: Not on file    Scream or Curse: Not on file     BP 116/68   Pulse (!) 138   Ht 5\' 9"  (1.753 m)   Wt 149 lb (67.6 kg)   SpO2 95%   BMI 22.00 kg/m   Physical Exam:  Well appearing NAD HEENT: Unremarkable Neck:  No JVD, no thyromegally Lymphatics:  No adenopathy Back:  No CVA tenderness Lungs:  Clear HEART:  Regular rate rhythm, no murmurs, no rubs, no clicks Abd:  soft, positive bowel sounds, no organomegally, no rebound, no guarding Ext:  2 plus pulses, no edema, no cyanosis, no clubbing Skin:  No rashes no nodules Neuro:  CN II through XII intact, motor grossly intact  EKG - atrial fib with a RVR  Assess/Plan: 1. PAF - he is now persistent and his VR is increased. I discussed the treatment options. As his afib is persistent now and not paroxysmal, I think DCCV is unlikely to maintain him in NSR. I have discussed amio vs dofetilide and recommend initiation of  dofetilide. He denies missing any of his blood thinner. 2. Coags - he has not missed a dose and is not having any bleeding. 3. orthostasis - mostly resolved 4. Stroke - he has not had any recurrent symptoms. He is now limited by dyspnea.   Sharlot Gowda Cameron Gasner,MD

## 2022-12-23 ENCOUNTER — Telehealth (HOSPITAL_COMMUNITY): Payer: Self-pay | Admitting: *Deleted

## 2022-12-23 ENCOUNTER — Telehealth: Payer: Self-pay | Admitting: Pharmacist

## 2022-12-23 ENCOUNTER — Encounter (HOSPITAL_COMMUNITY): Payer: Self-pay

## 2022-12-23 NOTE — Telephone Encounter (Signed)
Medication list reviewed in anticipation of upcoming Tikosyn initiation. Patient is  taking one contraindicated medication and 1 QTc prolonging medications.   Patient is on prochlorperazine (notes says he takes 3 times a week for n/v). This medication is contraindicated with Tikosyn use due to its ability to increase Tikosyn concentration. Although he does not use it frequently, risk would still exist. Alternatives like zofran, increase QTC risk and the use of this should be minimized as well.  Patient inhaler Trelegy can incase QTc. Would recommend close monitoring. No change in therapy required.  Patient is anticoagulated on Eliquis on the appropriate dose. Please ensure that patient has not missed any anticoagulation doses in the 3 weeks prior to Tikosyn initiation.   Patient will need to be counseled to avoid use of Benadryl while on Tikosyn and in the 2-3 days prior to Tikosyn initiation.

## 2022-12-23 NOTE — Telephone Encounter (Signed)
Patient does not take Prochlorperazine. Removed from his medication list.

## 2022-12-23 NOTE — Addendum Note (Signed)
Addended by: Shona Simpson on: 12/23/2022 04:10 PM   Modules accepted: Orders

## 2022-12-23 NOTE — Telephone Encounter (Signed)
Left message to schedule tikosyn admission per Dr Ladona Ridgel. Will await call back to schedule.

## 2022-12-26 ENCOUNTER — Inpatient Hospital Stay (HOSPITAL_COMMUNITY)
Admission: AD | Admit: 2022-12-26 | Discharge: 2022-12-31 | DRG: 309 | Disposition: A | Discharge status: Home / Self Care | Payer: Medicare Other | Source: Ambulatory Visit | Attending: Cardiology | Admitting: Cardiology

## 2022-12-26 ENCOUNTER — Encounter (HOSPITAL_COMMUNITY): Payer: Self-pay | Admitting: Physician Assistant

## 2022-12-26 ENCOUNTER — Ambulatory Visit (HOSPITAL_BASED_OUTPATIENT_CLINIC_OR_DEPARTMENT_OTHER)
Admission: RE | Admit: 2022-12-26 | Discharge: 2022-12-26 | Disposition: A | Discharge status: Home / Self Care | Payer: Medicare Other | Source: Ambulatory Visit | Attending: Physician Assistant | Admitting: Physician Assistant

## 2022-12-26 ENCOUNTER — Other Ambulatory Visit: Payer: Self-pay

## 2022-12-26 VITALS — BP 122/70 | HR 155 | Ht 69.0 in | Wt 148.2 lb

## 2022-12-26 DIAGNOSIS — E119 Type 2 diabetes mellitus without complications: Secondary | ICD-10-CM | POA: Diagnosis not present

## 2022-12-26 DIAGNOSIS — H9190 Unspecified hearing loss, unspecified ear: Secondary | ICD-10-CM | POA: Diagnosis not present

## 2022-12-26 DIAGNOSIS — Z7901 Long term (current) use of anticoagulants: Secondary | ICD-10-CM

## 2022-12-26 DIAGNOSIS — C3490 Malignant neoplasm of unspecified part of unspecified bronchus or lung: Secondary | ICD-10-CM | POA: Diagnosis not present

## 2022-12-26 DIAGNOSIS — Z7951 Long term (current) use of inhaled steroids: Secondary | ICD-10-CM

## 2022-12-26 DIAGNOSIS — I420 Dilated cardiomyopathy: Secondary | ICD-10-CM | POA: Insufficient documentation

## 2022-12-26 DIAGNOSIS — Z860100 Personal history of colon polyps, unspecified: Secondary | ICD-10-CM

## 2022-12-26 DIAGNOSIS — R9431 Abnormal electrocardiogram [ECG] [EKG]: Secondary | ICD-10-CM | POA: Diagnosis not present

## 2022-12-26 DIAGNOSIS — D6869 Other thrombophilia: Secondary | ICD-10-CM | POA: Insufficient documentation

## 2022-12-26 DIAGNOSIS — Z923 Personal history of irradiation: Secondary | ICD-10-CM | POA: Diagnosis not present

## 2022-12-26 DIAGNOSIS — R072 Precordial pain: Secondary | ICD-10-CM | POA: Insufficient documentation

## 2022-12-26 DIAGNOSIS — I480 Paroxysmal atrial fibrillation: Secondary | ICD-10-CM | POA: Insufficient documentation

## 2022-12-26 DIAGNOSIS — R112 Nausea with vomiting, unspecified: Secondary | ICD-10-CM | POA: Diagnosis not present

## 2022-12-26 DIAGNOSIS — K219 Gastro-esophageal reflux disease without esophagitis: Secondary | ICD-10-CM | POA: Diagnosis not present

## 2022-12-26 DIAGNOSIS — I4819 Other persistent atrial fibrillation: Principal | ICD-10-CM | POA: Diagnosis present

## 2022-12-26 DIAGNOSIS — J449 Chronic obstructive pulmonary disease, unspecified: Secondary | ICD-10-CM | POA: Insufficient documentation

## 2022-12-26 DIAGNOSIS — Z23 Encounter for immunization: Secondary | ICD-10-CM

## 2022-12-26 DIAGNOSIS — Z885 Allergy status to narcotic agent status: Secondary | ICD-10-CM | POA: Diagnosis not present

## 2022-12-26 DIAGNOSIS — G8929 Other chronic pain: Secondary | ICD-10-CM | POA: Diagnosis not present

## 2022-12-26 DIAGNOSIS — Z8673 Personal history of transient ischemic attack (TIA), and cerebral infarction without residual deficits: Secondary | ICD-10-CM | POA: Insufficient documentation

## 2022-12-26 DIAGNOSIS — Z85118 Personal history of other malignant neoplasm of bronchus and lung: Secondary | ICD-10-CM | POA: Insufficient documentation

## 2022-12-26 DIAGNOSIS — I4891 Unspecified atrial fibrillation: Secondary | ICD-10-CM | POA: Diagnosis not present

## 2022-12-26 DIAGNOSIS — Z79899 Other long term (current) drug therapy: Secondary | ICD-10-CM

## 2022-12-26 DIAGNOSIS — T462X5A Adverse effect of other antidysrhythmic drugs, initial encounter: Secondary | ICD-10-CM | POA: Diagnosis not present

## 2022-12-26 DIAGNOSIS — Z0000 Encounter for general adult medical examination without abnormal findings: Secondary | ICD-10-CM | POA: Diagnosis not present

## 2022-12-26 DIAGNOSIS — R7303 Prediabetes: Secondary | ICD-10-CM

## 2022-12-26 LAB — BASIC METABOLIC PANEL
Anion gap: 9 (ref 5–15)
BUN: 16 mg/dL (ref 8–23)
CO2: 19 mmol/L — ABNORMAL LOW (ref 22–32)
Calcium: 8.6 mg/dL — ABNORMAL LOW (ref 8.9–10.3)
Chloride: 107 mmol/L (ref 98–111)
Creatinine, Ser: 1.12 mg/dL (ref 0.61–1.24)
GFR, Estimated: >60 mL/min (ref >60)
Glucose, Bld: 118 mg/dL — ABNORMAL HIGH (ref 70–99)
Potassium: 3.9 mmol/L (ref 3.5–5.1)
Sodium: 135 mmol/L (ref 135–145)

## 2022-12-26 LAB — MAGNESIUM: Magnesium: 2 mg/dL (ref 1.7–2.4)

## 2022-12-26 MED ORDER — FLUTICASONE FUROATE-VILANTEROL 100-25 MCG/ACT IN AEPB
1.0000 | INHALATION_SPRAY | Freq: Every day | RESPIRATORY_TRACT | Status: DC
  Administered 2022-12-27 – 2022-12-31 (×5): 1 via RESPIRATORY_TRACT
  Filled 2022-12-26: qty 28

## 2022-12-26 MED ORDER — SODIUM CHLORIDE 0.9% FLUSH: 3.0000 mL | INTRAVENOUS | Status: DC | PRN

## 2022-12-26 MED ORDER — HYDROCODONE-ACETAMINOPHEN 10-325 MG PO TABS
1.0000 | ORAL_TABLET | Freq: Three times a day (TID) | ORAL | Status: DC | PRN
  Administered 2022-12-26 – 2022-12-27 (×2): 1 via ORAL
  Filled 2022-12-26 (×2): qty 1

## 2022-12-26 MED ORDER — MAGNESIUM SULFATE 2 GM/50ML IV SOLN
2.0000 g | Freq: Once | INTRAVENOUS | Status: AC
  Administered 2022-12-26: 2 g via INTRAVENOUS
  Filled 2022-12-26: qty 50

## 2022-12-26 MED ORDER — DOFETILIDE 250 MCG PO CAPS
250.0000 ug | ORAL_CAPSULE | Freq: Two times a day (BID) | ORAL | Status: DC
  Administered 2022-12-26 – 2022-12-27 (×3): 250 ug via ORAL
  Filled 2022-12-26 (×3): qty 1

## 2022-12-26 MED ORDER — POTASSIUM CHLORIDE CRYS ER 20 MEQ PO TBCR
40.0000 meq | EXTENDED_RELEASE_TABLET | ORAL | Status: AC
  Administered 2022-12-26: 40 meq via ORAL
  Filled 2022-12-26: qty 2

## 2022-12-26 MED ORDER — GABAPENTIN 300 MG PO CAPS
300.0000 mg | ORAL_CAPSULE | Freq: Two times a day (BID) | ORAL | Status: DC
  Administered 2022-12-26 – 2022-12-27 (×2): 300 mg via ORAL
  Filled 2022-12-26 (×2): qty 1

## 2022-12-26 MED ORDER — SODIUM CHLORIDE 0.9% FLUSH
3.0000 mL | Freq: Two times a day (BID) | INTRAVENOUS | Status: DC
  Administered 2022-12-27 – 2022-12-31 (×8): 3 mL via INTRAVENOUS

## 2022-12-26 MED ORDER — INFLUENZA VAC A&B SURF ANT ADJ 0.5 ML IM SUSY
0.5000 mL | PREFILLED_SYRINGE | INTRAMUSCULAR | Status: AC
  Administered 2022-12-29: 0.5 mL via INTRAMUSCULAR
  Filled 2022-12-26 (×2): qty 0.5

## 2022-12-26 MED ORDER — TAMSULOSIN HCL 0.4 MG PO CAPS
0.4000 mg | ORAL_CAPSULE | Freq: Every day | ORAL | Status: DC
  Administered 2022-12-27 – 2022-12-31 (×5): 0.4 mg via ORAL
  Filled 2022-12-26 (×5): qty 1

## 2022-12-26 MED ORDER — UMECLIDINIUM BROMIDE 62.5 MCG/ACT IN AEPB
1.0000 | INHALATION_SPRAY | Freq: Every day | RESPIRATORY_TRACT | Status: DC
  Administered 2022-12-27 – 2022-12-31 (×5): 1 via RESPIRATORY_TRACT
  Filled 2022-12-26: qty 7

## 2022-12-26 MED ORDER — APIXABAN 5 MG PO TABS
5.0000 mg | ORAL_TABLET | Freq: Two times a day (BID) | ORAL | Status: DC
  Administered 2022-12-26 – 2022-12-31 (×10): 5 mg via ORAL
  Filled 2022-12-26 (×10): qty 1

## 2022-12-26 MED ORDER — METOPROLOL SUCCINATE ER 25 MG PO TB24: 25.0000 mg | ORAL_TABLET | Freq: Every day | ORAL | Status: DC

## 2022-12-26 MED ORDER — PANTOPRAZOLE SODIUM 40 MG PO TBEC
40.0000 mg | DELAYED_RELEASE_TABLET | Freq: Every day | ORAL | Status: DC
  Administered 2022-12-26 – 2022-12-30 (×5): 40 mg via ORAL
  Filled 2022-12-26 (×6): qty 1

## 2022-12-26 MED ORDER — SODIUM CHLORIDE 0.9 % IV SOLN: 250.0000 mL | INTRAVENOUS | Status: DC | PRN

## 2022-12-26 MED ORDER — FLUTICASONE PROPIONATE 50 MCG/ACT NA SUSP
1.0000 | Freq: Every morning | NASAL | Status: DC
  Administered 2022-12-27 – 2022-12-31 (×5): 1 via NASAL
  Filled 2022-12-26: qty 16

## 2022-12-26 MED ORDER — METOPROLOL SUCCINATE ER 25 MG PO TB24
25.0000 mg | ORAL_TABLET | Freq: Two times a day (BID) | ORAL | Status: DC
  Administered 2022-12-26: 25 mg via ORAL
  Filled 2022-12-26: qty 1

## 2022-12-26 MED ORDER — PRAVASTATIN SODIUM 10 MG PO TABS
20.0000 mg | ORAL_TABLET | Freq: Every day | ORAL | Status: DC
  Administered 2022-12-27 – 2022-12-31 (×5): 20 mg via ORAL
  Filled 2022-12-26 (×5): qty 2

## 2022-12-26 NOTE — H&P (Signed)
Primary Care Physician: [name], PA-C Primary Cardiologist: None Electrophysiologist: Lewayne Bunting, MD  Referring Physician: Dr Ladona Ridgel   Loni Abdon is a 72 y.o. male with a history of non-small cell lung cancer, COPD, CVA, atrial fibrillation who presents for follow up in the [facility].  The patient was initially diagnosed with atrial fibrillation on an ILR which has been placed following a cryptogenic stroke. Patient is on Eliquis for a CHADS2VASC score of 3. He was seen by Dr Ladona Ridgel and found to be persistently in afib, dofetilide admission recommended.   On follow up today, patient remains in rapid afib today. He has symptoms of fatigue and SOB. No missed doses of anticoagulation in the past 3 weeks.   Today, he denies symptoms of palpitations, chest pain, orthopnea, PND, lower extremity edema, dizziness, presyncope, syncope, snoring, daytime somnolence, bleeding, or neurologic sequela. The patient is tolerating medications without difficulties and is otherwise without complaint today.    Atrial Fibrillation Risk Factors:  he does not have symptoms or diagnosis of sleep apnea. he does not have a history of rheumatic fever.   Atrial Fibrillation Management history:  Previous antiarrhythmic drugs: none Previous cardioversions: none Previous ablations: none Anticoagulation history: Eliquis  ROS- All systems are reviewed and negative except as per the HPI above.  Past Medical History:  Diagnosis Date   Anxiety    Arthritis    Atrial fibrillation (HCC)    Chronic back pain    buldging disc   Chronic neck pain    ruptured disc   Cough    hx smoking   Diverticulosis    Esophageal stricture    GERD (gastroesophageal reflux disease)    takes Prilosec daily   H/O hiatal hernia    Hearing loss    Hepatitis-C 2017   treated and cured per patient   Hiatal hernia    History of colon polyps    History of radiation therapy    Left  Lung- 01/10/22-02/18/22- Dr. Antony Blackbird   Insomnia    takes Elavil nightly   Lipoma of abdominal wall 02/11/2011   Lung cancer (HCC)    Stroke (HCC)    x 2   Type 2 diabetes mellitus (HCC) 07/22/2020   patient denies DM, per MD note pt is pre-diabetic    Current Facility-Administered Medications  Medication Dose Route Frequency Provider Last Rate Last Admin   apixaban (ELIQUIS) tablet 5 mg  5 mg Oral BID Marinus Maw, MD       dofetilide (TIKOSYN) capsule 250 mcg  250 mcg Oral BID Fenton, Clint R, PA       [START ON 12/27/2022] fluticasone (FLONASE) 50 MCG/ACT nasal spray 1 spray  1 spray Each Nare q AM Fenton, Clint R, PA       [START ON 12/27/2022] fluticasone furoate-vilanterol (BREO ELLIPTA) 100-25 MCG/ACT 1 puff  1 puff Inhalation Daily Fenton, Clint R, PA       And   [START ON 12/27/2022] umeclidinium bromide (INCRUSE ELLIPTA) 62.5 MCG/ACT 1 puff  1 puff Inhalation Daily Fenton, Clint R, PA       magnesium sulfate IVPB 2 g 50 mL  2 g Intravenous Once Rollene Fare, RPH       [START ON 12/27/2022] pantoprazole (PROTONIX) EC tablet 40 mg  40 mg Oral Daily Fenton, Clint R, PA       potassium chloride SA (KLOR-CON M) CR tablet 40 mEq  40 mEq Oral NOW Clinton Sawyer,  Rae Roam, RPH       [START ON 12/27/2022] pravastatin (PRAVACHOL) tablet 20 mg  20 mg Oral Daily Fenton, Clint R, PA       sodium chloride flush (NS) 0.9 % injection 3 mL  3 mL Intravenous Q12H Marinus Maw, MD       sodium chloride flush (NS) 0.9 % injection 3 mL  3 mL Intravenous PRN Marinus Maw, MD       [START ON 12/27/2022] tamsulosin (FLOMAX) capsule 0.4 mg  0.4 mg Oral Daily Fenton, Clint R, PA        Physical Exam: BP 129/87 (BP Location: Right Arm)   Pulse 75   Temp (!) 97.4 F (36.3 C) (Oral)   Resp 16   Ht 5\' 10"  (1.778 m)   Wt 67.5 kg   SpO2 96%   BMI 21.35 kg/m   GEN: Well nourished, well developed in no acute distress NECK: No JVD; No carotid bruits CARDIAC: Irregularly irregular rate  and rhythm, no murmurs, rubs, gallops RESPIRATORY:  Clear to auscultation without rales, wheezing or rhonchi  ABDOMEN: Soft, non-tender, non-distended EXTREMITIES:  No edema; No deformity   Wt Readings from Last 3 Encounters:  12/26/22 67.5 kg  12/26/22 67.2 kg  12/22/22 67.6 kg     EKG today demonstrates  Afib with RVR Vent. rate 155 BPM PR interval * ms QRS duration 82 ms QT/QTcB 304/488 ms   Echo 09/13/15 demonstrated  - Left ventricle: The cavity size was normal. Wall thickness was    normal. Systolic function was normal. The estimated ejection    fraction was in the range of 55% to 60%. Wall motion was normal;    there were no regional wall motion abnormalities. Doppler    parameters are consistent with abnormal left ventricular    relaxation (grade 1 diastolic dysfunction).  - Aortic root: The aortic root was mildly dilated.  - Atrial septum: There was increased thickness of the septum,    consistent with lipomatous hypertrophy.   Impressions:   - Normal LV systolic function; grade 1 diastolic dysfunction;    mildly dilated aortic root.    CHA2DS2-VASc Score = 3  The patient's score is based upon: CHF History: 0 HTN History: 0 Diabetes History: 0 Stroke History: 2 Vascular Disease History: 0 Age Score: 1 Gender Score: 0       ASSESSMENT AND PLAN: Persistent Atrial Fibrillation (ICD10:  I48.19) The patient's CHA2DS2-VASc score is 3, indicating a 3.2% annual risk of stroke.   Patient remains in rapid afib. He presents for dofetilide admission. Continue Eliquis 5 mg BID, states no missed doses in the last 3 weeks. No recent benadryl use PharmD has screened medications QTc in SR 432 ms Labs today show creatinine at 1.12, K+ 3.9 and mag 2.0, CrCl calculated at 57 mL/min  Secondary Hypercoagulable State (ICD10:  D68.69) The patient is at significant risk for stroke/thromboembolism based upon his CHA2DS2-VASc Score of 3.  Continue Apixaban (Eliquis).   Lung  Cancer Stage IIIB non-small cell lung cancer Followed by Dr Arbutus Ped   ABOVE AFib clinic note to serve as H&P Pt arrived for tikosyn admission Calc CrCl is 57, planned for dose. Pharmacy per protocol for electrolyte replacement  HR 120's-130s here, will give Toprol 25mg   BID to start tonight He confirms no missed eliquis doses taking BID He reports chronic constant heaviness in his chest for many months since XRT, will updat e his echo  Francis Dowse, PA-C

## 2022-12-26 NOTE — Progress Notes (Signed)
Primary Care Physician: [name], PA-C Primary Cardiologist: None Electrophysiologist: Lewayne Bunting, MD  Referring Physician: Dr Ladona Ridgel   Cameron Roman is a 72 y.o. male with a history of non-small cell lung cancer, COPD, CVA, atrial fibrillation who presents for follow up in the [facility] Fibr[facility].  The patient was initially diagnosed with atrial fibrillation on an ILR which has been placed following a cryptogenic stroke. Patient is on Eliquis for a CHADS2VASC score of 3. He was seen by Dr Ladona Ridgel and found to be persistently in afib, dofetilide admission recommended.   On follow up today, patient remains in rapid afib today. He has symptoms of fatigue and SOB. No missed doses of anticoagulation in the past 3 weeks.   Today, he denies symptoms of palpitations, chest pain, orthopnea, PND, lower extremity edema, dizziness, presyncope, syncope, snoring, daytime somnolence, bleeding, or neurologic sequela. The patient is tolerating medications without difficulties and is otherwise without complaint today.    Atrial Fibrillation Risk Factors:  he does not have symptoms or diagnosis of sleep apnea. he does not have a history of rheumatic fever.   Atrial Fibrillation Management history:  Previous antiarrhythmic drugs: none Previous cardioversions: none Previous ablations: none Anticoagulation history: Eliquis  ROS- All systems are reviewed and negative except as per the HPI above.  Past Medical History:  Diagnosis Date   Anxiety    Arthritis    Atrial fibrillation (HCC)    Chronic back pain    buldging disc   Chronic neck pain    ruptured disc   Cough    hx smoking   Diverticulosis    Esophageal stricture    GERD (gastroesophageal reflux disease)    takes Prilosec daily   H/O hiatal hernia    Hearing loss    Hepatitis-C 2017   treated and cured per patient   Hiatal hernia    History of colon polyps    History of radiation therapy    Left  Lung- 01/10/22-02/18/22- Dr. Antony Blackbird   Insomnia    takes Elavil nightly   Lipoma of abdominal wall 02/11/2011   Lung cancer (HCC)    Stroke (HCC)    x 2   Type 2 diabetes mellitus (HCC) 07/22/2020   patient denies DM, per MD note pt is pre-diabetic    Current Outpatient Medications  Medication Sig Dispense Refill   cyanocobalamin (VITAMIN B12) 1000 MCG/ML injection Inject into the muscle.     ELIQUIS 5 MG TABS tablet TAKE ONE TABLET BY MOUTH TWICE DAILY 60 tablet 5   fluticasone (FLONASE) 50 MCG/ACT nasal spray Place 1 spray into the nose in the morning.     Fluticasone-Umeclidin-Vilant (TRELEGY ELLIPTA) 100-62.5-25 MCG/ACT AEPB Inhale 1 puff into the lungs daily. 60 each 11   Fluticasone-Umeclidin-Vilant (TRELEGY ELLIPTA) 100-62.5-25 MCG/ACT AEPB Inhale 1 puff into the lungs daily. 14 each 0   gabapentin (NEURONTIN) 300 MG capsule Take 1 capsule (300 mg total) by mouth 3 (three) times daily. Take one capsule twice to three times daily (Patient taking differently: Take 300 mg by mouth 2 (two) times daily.) 270 capsule 3   HYDROcodone-acetaminophen (NORCO) 10-325 MG tablet Take 1 tablet by mouth 3 (three) times daily.     Omega-3 Fatty Acids (FISH OIL) 1000 MG CAPS Take 1,000 mg by mouth in the morning and at bedtime.     omeprazole (PRILOSEC) 20 MG capsule Take 1 capsule (20 mg total) by mouth daily. 90 capsule 3   pantoprazole (  PROTONIX) 40 MG tablet TAKE ONE (1) TABLET BY MOUTH EVERY DAY 90 tablet 3   pravastatin (PRAVACHOL) 20 MG tablet TAKE ONE (1) TABLET BY MOUTH EVERY DAY 30 tablet 0   tamsulosin (FLOMAX) 0.4 MG CAPS capsule Take 1 capsule (0.4 mg total) by mouth daily. 90 capsule 3   Current Facility-Administered Medications  Medication Dose Route Frequency Provider Last Rate Last Admin   methylPREDNISolone acetate (DEPO-MEDROL) injection 80 mg  80 mg Intramuscular Once Dettinger, Elige Radon, MD        Physical Exam: BP 122/70   Pulse (!) 155   Ht 5\' 9"  (1.753 m)   Wt  67.2 kg   BMI 21.89 kg/m   GEN: Well nourished, well developed in no acute distress NECK: No JVD; No carotid bruits CARDIAC: Irregularly irregular rate and rhythm, no murmurs, rubs, gallops RESPIRATORY:  Clear to auscultation without rales, wheezing or rhonchi  ABDOMEN: Soft, non-tender, non-distended EXTREMITIES:  No edema; No deformity   Wt Readings from Last 3 Encounters:  12/26/22 67.2 kg  12/22/22 67.6 kg  12/21/22 66.5 kg     EKG today demonstrates  Afib with RVR Vent. rate 155 BPM PR interval * ms QRS duration 82 ms QT/QTcB 304/488 ms   Echo 09/13/15 demonstrated  - Left ventricle: The cavity size was normal. Wall thickness was    normal. Systolic function was normal. The estimated ejection    fraction was in the range of 55% to 60%. Wall motion was normal;    there were no regional wall motion abnormalities. Doppler    parameters are consistent with abnormal left ventricular    relaxation (grade 1 diastolic dysfunction).  - Aortic root: The aortic root was mildly dilated.  - Atrial septum: There was increased thickness of the septum,    consistent with lipomatous hypertrophy.   Impressions:   - Normal LV systolic function; grade 1 diastolic dysfunction;    mildly dilated aortic root.    CHA2DS2-VASc Score = 3  The patient's score is based upon: CHF History: 0 HTN History: 0 Diabetes History: 0 Stroke History: 2 Vascular Disease History: 0 Age Score: 1 Gender Score: 0       ASSESSMENT AND PLAN: Persistent Atrial Fibrillation (ICD10:  I48.19) The patient's CHA2DS2-VASc score is 3, indicating a 3.2% annual risk of stroke.   Patient remains in rapid afib. He presents for dofetilide admission. Continue Eliquis 5 mg BID, states no missed doses in the last 3 weeks. No recent benadryl use PharmD has screened medications QTc in SR 432 ms Labs today show creatinine at 1.12, K+ 3.9 and mag 2.0, CrCl calculated at 57 mL/min  Secondary Hypercoagulable State  (ICD10:  D68.69) The patient is at significant risk for stroke/thromboembolism based upon his CHA2DS2-VASc Score of 3.  Continue Apixaban (Eliquis).   Lung Cancer Stage IIIB non-small cell lung cancer Followed by Dr Arbutus Ped   To be admitted later today once a bed becomes available.       Jorja Loa PA-C Afib [facility] [address] [place], [place] [address] [phone]

## 2022-12-26 NOTE — Progress Notes (Signed)
Pharmacy: Dofetilide (Tikosyn) - Initial Consult Assessment and Electrolyte Replacement  Pharmacy consulted to assist in monitoring and replacing electrolytes in this 72 y.o. male admitted on 12/26/2022 undergoing dofetilide initiation. First dofetilide dose: 250 mcg  Assessment:  Patient Exclusion Criteria: If any screening criteria checked as "Yes", then  patient  should NOT receive dofetilide until criteria item is corrected.  If "Yes" please indicate correction plan.  YES  NO Patient  Exclusion Criteria Correction Plan   []   [x]   Baseline QTc interval is greater than or equal to 440 msec. IF above YES box checked dofetilide contraindicated unless patient has ICD; then may proceed if QTc 500-550 msec or with known ventricular conduction abnormalities may proceed with QTc 550-600 msec. QTc =      []   [x]   Patient is known or suspected to have a digoxin level greater than 2 ng/ml: No results found for: "DIGOXIN"     []   [x]   Creatinine clearance less than 20 ml/min (calculated using Cockcroft-Gault, actual body weight and serum creatinine): Estimated Creatinine Clearance: 57.8 mL/min (by C-G formula based on SCr of 1.12 mg/dL).     [x]   []  Patient has received drugs known to prolong the QT intervals within the last 48 hours (phenothiazines, tricyclics or tetracyclic antidepressants, erythromycin, H-1 antihistamines, cisapride, fluoroquinolones, azithromycin, ondansetron).   Updated information on QT prolonging agents is available to be searched on the following database:QT prolonging agents  Trelegy can increase QTc - recommendation per PharmD is to monitor closely   []   [x]   Patient received a dose of hydrochlorothiazide (Oretic) alone or in any combination including triamterene (Dyazide, Maxzide) in the last 48 hours.    []   [x]  Patient received a medication known to increase dofetilide plasma concentrations prior to initial dofetilide dose:  Trimethoprim (Primsol,  Proloprim) in the last 36 hours Verapamil (Calan, Verelan) in the last 36 hours or a sustained release dose in the last 72 hours Megestrol (Megace) in the last 5 days  Cimetidine (Tagamet) in the last 6 hours Ketoconazole (Nizoral) in the last 24 hours Itraconazole (Sporanox) in the last 48 hours  Prochlorperazine (Compazine) in the last 36 hours     []   [x]   Patient is known to have a history of torsades de pointes; congenital or acquired long QT syndromes.    []   [x]   Patient has received a Class 1 antiarrhythmic with less than 2 half-lives since last dose. (Disopyramide, Quinidine, Procainamide, Lidocaine, Mexiletine, Flecainide, Propafenone)    []   [x]   Patient has received amiodarone therapy in the past 3 months or amiodarone level is greater than 0.3 ng/ml.    Labs:    Component Value Date/Time   K 3.9 12/26/2022 1112   MG 2.0 12/26/2022 1112     Plan: Select One Calculated CrCl  Dose q12h  []  > 60 ml/min 500 mcg  [x]  40-60 ml/min 250 mcg  []  20-40 ml/min 125 mcg   [x]   Physician selected initial dose within range recommended for patients level of renal function - will monitor for response.  []   Physician selected initial dose outside of range recommended for patients level of renal function - will discuss if the dose should be altered at this time.   Patient has been appropriately anticoagulated with Eliquis.  Potassium: K 3.8-3.9:  Hold Tikosyn initiation and give KCl 40 mEq po x1 then begin Tikosyn at least 2hr after KCl dose - do not need to recheck K   Magnesium: Mg 1.8-2:  Give Mg 2 gm IV x1 to prevent Mg from dropping below 1.8 - do not need to recheck Mg. Appropriate to initiate Tikosyn   Thank you for allowing pharmacy to participate in this patient's care   Loralee Pacas, PharmD, BCPS 12/26/2022  4:49 PM  Please check AMION for all [facility] Pharmacy phone numbers After 10:00 PM, call Main Pharmacy [phone]

## 2022-12-27 ENCOUNTER — Other Ambulatory Visit (HOSPITAL_COMMUNITY): Payer: Self-pay

## 2022-12-27 ENCOUNTER — Encounter (HOSPITAL_COMMUNITY): Payer: Self-pay | Admitting: Cardiology

## 2022-12-27 ENCOUNTER — Inpatient Hospital Stay (HOSPITAL_COMMUNITY): Payer: Medicare Other

## 2022-12-27 ENCOUNTER — Encounter: Payer: Self-pay | Admitting: Physician Assistant

## 2022-12-27 ENCOUNTER — Other Ambulatory Visit: Payer: Self-pay

## 2022-12-27 DIAGNOSIS — Z0000 Encounter for general adult medical examination without abnormal findings: Secondary | ICD-10-CM | POA: Diagnosis not present

## 2022-12-27 DIAGNOSIS — I4819 Other persistent atrial fibrillation: Secondary | ICD-10-CM | POA: Diagnosis not present

## 2022-12-27 DIAGNOSIS — R9431 Abnormal electrocardiogram [ECG] [EKG]: Secondary | ICD-10-CM

## 2022-12-27 LAB — BASIC METABOLIC PANEL
Anion gap: 9 (ref 5–15)
BUN: 12 mg/dL (ref 8–23)
CO2: 22 mmol/L (ref 22–32)
Calcium: 8.8 mg/dL — ABNORMAL LOW (ref 8.9–10.3)
Chloride: 106 mmol/L (ref 98–111)
Creatinine, Ser: 0.92 mg/dL (ref 0.61–1.24)
GFR, Estimated: >60 mL/min (ref >60)
Glucose, Bld: 94 mg/dL (ref 70–99)
Potassium: 4.6 mmol/L (ref 3.5–5.1)
Sodium: 137 mmol/L (ref 135–145)

## 2022-12-27 LAB — ECHOCARDIOGRAM COMPLETE
Area-P 1/2: 4.1 cm2
Calc EF: 36.5 %
Height: 70 in
MV M vel: 5.04 m/s
MV Peak grad: 101.6 mm[Hg]
Radius: 0.3 cm
S' Lateral: 4.2 cm
Single Plane A2C EF: 34.1 %
Single Plane A4C EF: 40.1 %
Weight: 2380.8 [oz_av]

## 2022-12-27 LAB — MAGNESIUM: Magnesium: 2.2 mg/dL (ref 1.7–2.4)

## 2022-12-27 MED ORDER — HYDROCODONE-ACETAMINOPHEN 10-325 MG PO TABS
1.0000 | ORAL_TABLET | Freq: Three times a day (TID) | ORAL | Status: DC | PRN
  Administered 2022-12-27 – 2022-12-31 (×10): 1 via ORAL
  Filled 2022-12-27 (×10): qty 1

## 2022-12-27 MED ORDER — GABAPENTIN 300 MG PO CAPS
300.0000 mg | ORAL_CAPSULE | Freq: Three times a day (TID) | ORAL | Status: DC
  Administered 2022-12-27 – 2022-12-31 (×12): 300 mg via ORAL
  Filled 2022-12-27 (×12): qty 1

## 2022-12-27 NOTE — TOC CM/SW Note (Signed)
Transition of Care [facility]) - Inpatient Brief Assessment   Patient Details  Name: Cameron Roman MRN: [id number] Date of Birth: 1950/03/18  Transition of [facility]) CM/SW Contact:    Gala Lewandowsky, RN Phone Number: 12/27/2022, 3:09 PM   Clinical Narrative: Transition of Care Department [facility]) has reviewed the patient. Patient presented for Tikosyn Load. Benefits check submitted for cost. Case Manager will discuss cost and pharmacy of choice as the patient progresses.  Transition of Care Asessment: Insurance and Status: Insurance coverage has been reviewed Patient has primary care physician: Yes  Prior/Current Home Services: No current home services Social Determinants of Health Reivew: SDOH reviewed no interventions necessary Readmission risk has been reviewed: Yes Transition of care needs: no transition of care needs at this time

## 2022-12-27 NOTE — Progress Notes (Signed)
Pt's QTC post first dose of tikosyn is 422.  Pt  has also converted to SR 70s.

## 2022-12-27 NOTE — Progress Notes (Cosign Needed Addendum)
Post dose EKG is reviewed with Dr. Ladona Ridgel, QT felt to be shorter then machine read, borderline QTc Recommends continue dose tonight with continued close monitoring of his QT/QTc  Francis Dowse, PA-C  Agree  Lewayne Bunting, MD

## 2022-12-27 NOTE — TOC Benefit Eligibility Note (Signed)
Patient Product/process development scientist completed.    The patient is insured through [facility]. Patient has Medicare and is not eligible for a copay card, but may be able to apply for patient assistance, if available.    Ran test claim for dofetilide (Tikosyn) 250 mcg and the current 30 day co-pay is $4.50.   This test claim was processed through [facility]- copay amounts may vary at other pharmacies due to pharmacy/plan contracts, or as the patient moves through the different stages of their insurance plan.     Roland Earl, CPHT Pharmacy Technician III Certified Patient Advocate [facility] Pharm[facility] Patient Advocate Team Direct Number: [phone]  Fax: [phone]

## 2022-12-27 NOTE — Progress Notes (Signed)
Pharmacy: Dofetilide (Tikosyn) - Follow Up Assessment and Electrolyte Replacement  Pharmacy consulted to assist in monitoring and replacing electrolytes in this 72 y.o. male admitted on 12/26/2022 undergoing dofetilide initiation. First dofetilide dose: 10/14 PM  Labs:    Component Value Date/Time   K 4.6 12/27/2022 0600   MG 2.2 12/27/2022 0600     Plan: Potassium: K >/= 4: No additional supplementation needed  Magnesium: Mg > 2: No additional supplementation needed   Thank you for allowing pharmacy to participate in this patient's care   Carron Brazen 12/27/2022  7:13 AM

## 2022-12-27 NOTE — Progress Notes (Cosign Needed)
Rounding Note    Patient Name: Cameron Roman Date of Encounter: 12/27/2022  [facility] Cardiologist: None   Subjective   He thinks he feels more comfortable in SR, hard to tell  Inpatient Medications    Scheduled Meds:  apixaban  5 mg Oral BID   dofetilide  250 mcg Oral BID   fluticasone  1 spray Each Nare q AM   fluticasone furoate-vilanterol  1 puff Inhalation Daily   And   umeclidinium bromide  1 puff Inhalation Daily   gabapentin  300 mg Oral BID   influenza vaccine adjuvanted  0.5 mL Intramuscular Tomorrow-1000   metoprolol succinate  25 mg Oral BID   pantoprazole  40 mg Oral Daily   pravastatin  20 mg Oral Daily   sodium chloride flush  3 mL Intravenous Q12H   tamsulosin  0.4 mg Oral Daily   Continuous Infusions:  PRN Meds: HYDROcodone-acetaminophen, sodium chloride flush   Vital Signs    Vitals:   12/26/22 1610 12/26/22 1950 12/27/22 0030 12/27/22 0535  BP: 129/87 (!) 143/95 125/86 122/77  Pulse: 75 (!) 123 (!) 120 80  Resp: 16 18 19 16   Temp: (!) 97.4 F (36.3 C) 97.6 F (36.4 C) 97.6 F (36.4 C) 97.6 F (36.4 C)  TempSrc: Oral Oral Oral Oral  SpO2: 96% 92% 93% 90%  Weight:      Height:        Intake/Output Summary (Last 24 hours) at 12/27/2022 0856 Last data filed at 12/26/2022 2000 Gross per 24 hour  Intake 240 ml  Output --  Net 240 ml      12/26/2022    4:00 PM 12/26/2022   10:53 AM 12/22/2022   12:58 PM  Last 3 Weights  Weight (lbs) 148 lb 12.8 oz 148 lb 3.2 oz 149 lb  Weight (kg) 67.495 kg 67.223 kg 67.586 kg      Telemetry    AFib w/RVR > SR 70's - Personally Reviewed  ECG    AFib 124bpm, QTc 422 - Personally Reviewed  Physical Exam   GEN: No acute distress.   Neck: No JVD Cardiac: RRR, no murmurs, rubs, or gallops.  Respiratory: Clear to auscultation bilaterally. GI: Soft, nontender, non-distended  MS: No edema; No deformity. Neuro:  Nonfocal  Psych: Normal affect   Labs    High Sensitivity  Troponin:  No results for input(s): "TROPONINIHS" in the last 720 hours.   Chemistry Recent Labs  Lab 12/26/22 1112 12/27/22 0600  NA 135 137  K 3.9 4.6  CL 107 106  CO2 19* 22  GLUCOSE 118* 94  BUN 16 12  CREATININE 1.12 0.92  CALCIUM 8.6* 8.8*  MG 2.0 2.2  GFRNONAA >60 >60  ANIONGAP 9 9    Lipids No results for input(s): "CHOL", "TRIG", "HDL", "LABVLDL", "LDLCALC", "CHOLHDL" in the last 168 hours.  HematologyNo results for input(s): "WBC", "RBC", "HGB", "HCT", "MCV", "MCH", "MCHC", "RDW", "PLT" in the last 168 hours. Thyroid No results for input(s): "TSH", "FREET4" in the last 168 hours.  BNPNo results for input(s): "BNP", "PROBNP" in the last 168 hours.  DDimer No results for input(s): "DDIMER" in the last 168 hours.   Radiology    No results found.  Cardiac Studies   Echo 09/13/15 demonstrated  - Left ventricle: The cavity size was normal. Wall thickness was    normal. Systolic function was normal. The estimated ejection    fraction was in the range of 55% to 60%. Wall motion  was normal;    there were no regional wall motion abnormalities. Doppler    parameters are consistent with abnormal left ventricular    relaxation (grade 1 diastolic dysfunction).  - Aortic root: The aortic root was mildly dilated.  - Atrial septum: There was increased thickness of the septum,    consistent with lipomatous hypertrophy.   Impressions:   - Normal LV systolic function; grade 1 diastolic dysfunction;    mildly dilated aortic root.   Patient Profile     72 y.o. male stroke, COPD, lung ca, AFib admitted for Tikosyn   Assessment & Plan    Persistent AFib CHA2DS2Vasc is 3, on Eliquis Tikosyn load is in progress K+ 4.6 Mag 2.2 Creat 0.92 (stable QTc stable  Converted with drug last night Continue load    COPD Home regime  3. Chronic pain Pt requests his gabapentin be TID as written for (not BID) Home Norco reordered for Q 8 PRN   For questions or updates, please  contact [facility] Please consult [email or web address] for contact info under        Signed, Sheilah Pigeon, PA-C  12/27/2022, 8:56 AM

## 2022-12-28 ENCOUNTER — Inpatient Hospital Stay (HOSPITAL_COMMUNITY): Payer: Medicare Other | Admitting: Anesthesiology

## 2022-12-28 ENCOUNTER — Encounter (HOSPITAL_COMMUNITY)
Admission: AD | Disposition: A | Discharge status: Home / Self Care | Payer: Self-pay | Source: Ambulatory Visit | Attending: Internal Medicine

## 2022-12-28 ENCOUNTER — Other Ambulatory Visit: Payer: Self-pay

## 2022-12-28 DIAGNOSIS — I4819 Other persistent atrial fibrillation: Secondary | ICD-10-CM | POA: Diagnosis not present

## 2022-12-28 DIAGNOSIS — I4891 Unspecified atrial fibrillation: Secondary | ICD-10-CM

## 2022-12-28 HISTORY — PX: CARDIOVERSION: SHX1299

## 2022-12-28 LAB — BASIC METABOLIC PANEL
Anion gap: 12 (ref 5–15)
BUN: 12 mg/dL (ref 8–23)
CO2: 21 mmol/L — ABNORMAL LOW (ref 22–32)
Calcium: 9.1 mg/dL (ref 8.9–10.3)
Chloride: 104 mmol/L (ref 98–111)
Creatinine, Ser: 0.8 mg/dL (ref 0.61–1.24)
GFR, Estimated: >60 mL/min (ref >60)
Glucose, Bld: 101 mg/dL — ABNORMAL HIGH (ref 70–99)
Potassium: 4.1 mmol/L (ref 3.5–5.1)
Sodium: 137 mmol/L (ref 135–145)

## 2022-12-28 LAB — MAGNESIUM: Magnesium: 2 mg/dL (ref 1.7–2.4)

## 2022-12-28 LAB — GLUCOSE, CAPILLARY: Glucose-Capillary: 96 mg/dL (ref 70–99)

## 2022-12-28 SURGERY — CARDIOVERSION
Anesthesia: General

## 2022-12-28 MED ORDER — LIDOCAINE 2% (20 MG/ML) 5 ML SYRINGE
INTRAMUSCULAR | Status: DC | PRN
  Administered 2022-12-28: 60 mg via INTRAVENOUS

## 2022-12-28 MED ORDER — METOPROLOL TARTRATE 5 MG/5ML IV SOLN
5.0000 mg | Freq: Once | INTRAVENOUS | Status: AC
  Administered 2022-12-28: 5 mg via INTRAVENOUS
  Filled 2022-12-28: qty 5

## 2022-12-28 MED ORDER — CALCIUM CARBONATE ANTACID 500 MG PO CHEW
400.0000 mg | CHEWABLE_TABLET | Freq: Three times a day (TID) | ORAL | Status: DC | PRN
  Administered 2022-12-28: 400 mg via ORAL
  Filled 2022-12-28: qty 2

## 2022-12-28 MED ORDER — PROPOFOL 10 MG/ML IV BOLUS
INTRAVENOUS | Status: DC | PRN
  Administered 2022-12-28: 70 mg via INTRAVENOUS

## 2022-12-28 MED ORDER — DILTIAZEM HCL-DEXTROSE 125-5 MG/125ML-% IV SOLN (PREMIX)
5.0000 mg/h | INTRAVENOUS | Status: DC
  Administered 2022-12-28: 5 mg/h via INTRAVENOUS
  Administered 2022-12-29: 7.5 mg/h via INTRAVENOUS
  Filled 2022-12-28 (×2): qty 125

## 2022-12-28 MED ORDER — DOFETILIDE 125 MCG PO CAPS
125.0000 ug | ORAL_CAPSULE | Freq: Two times a day (BID) | ORAL | Status: DC
  Administered 2022-12-28: 125 ug via ORAL
  Filled 2022-12-28 (×3): qty 1

## 2022-12-28 SURGICAL SUPPLY — 1 items: PAD DEFIB RADIO PHYSIO CONN (PAD) ×1 IMPLANT

## 2022-12-28 NOTE — Interval H&P Note (Signed)
History and Physical Interval Note:  12/28/2022 11:06 AM  Cameron Roman  has presented today for surgery, with the diagnosis of afib.  The various methods of treatment have been discussed with the patient and family. After consideration of risks, benefits and other options for treatment, the patient has consented to  Procedure(s): CARDIOVERSION (N/A) as a surgical intervention.  The patient's history has been reviewed, patient examined, no change in status, stable for surgery.  I have reviewed the patient's chart and labs.  Questions were answered to the patient's satisfaction.     Jashun Puertas

## 2022-12-28 NOTE — Progress Notes (Signed)
Pharmacy: Dofetilide (Tikosyn) - Follow Up Assessment and Electrolyte Replacement  Pharmacy consulted to assist in monitoring and replacing electrolytes in this 72 y.o. male admitted on 12/26/2022 undergoing dofetilide initiation. First dofetilide dose: 10/14 PM  Labs:    Component Value Date/Time   K 4.1 12/28/2022 0504   MG 2.0 12/28/2022 0504     Plan: Potassium: K >/= 4: No additional supplementation needed  Magnesium: Mg > 2: No additional supplementation needed   Thank you for allowing pharmacy to participate in this patient's care   Kirk Ruths Paytes 12/28/2022  7:03 AM

## 2022-12-28 NOTE — Care Management Important Message (Signed)
Important Message  Patient Details  Name: Cameron Roman MRN: [id number] Date of Birth: [birth year]   Important Message Given:  Yes - Medicare IM     Sherilyn Banker 12/28/2022, 4:35 PM

## 2022-12-28 NOTE — Anesthesia Preprocedure Evaluation (Addendum)
Anesthesia Evaluation  Patient identified by MRN, date of birth, ID band Patient awake    Reviewed: Allergy & Precautions, NPO status , Patient's Chart, lab work & pertinent test results  Airway Mallampati: I  TM Distance: >3 FB Neck ROM: Full    Dental  (+) Edentulous Upper, Edentulous Lower   Pulmonary former smoker   Pulmonary exam normal        Cardiovascular hypertension, + dysrhythmias Atrial Fibrillation  Rhythm:Irregular Rate:Normal     Neuro/Psych    GI/Hepatic hiatal hernia,GERD  Medicated,,  Endo/Other  diabetes    Renal/GU      Musculoskeletal   Abdominal Normal abdominal exam  (+)   Peds  Hematology   Anesthesia Other Findings   Reproductive/Obstetrics                             Anesthesia Physical Anesthesia Plan  ASA: 3  Anesthesia Plan: General   Post-op Pain Management:    Induction: Intravenous  PONV Risk Score and Plan: 2 and Treatment may vary due to age or medical condition  Airway Management Planned: Mask  Additional Equipment: None  Intra-op Plan:   Post-operative Plan:   Informed Consent: I have reviewed the patients History and Physical, chart, labs and discussed the procedure including the risks, benefits and alternatives for the proposed anesthesia with the patient or authorized representative who has indicated his/her understanding and acceptance.     Dental advisory given  Plan Discussed with: CRNA  Anesthesia Plan Comments:        Anesthesia Quick Evaluation

## 2022-12-28 NOTE — CV Procedure (Signed)
Electrical Cardioversion Procedure Note Cameron Roman [id number] [birth year]  Procedure: Electrical Cardioversion Indications:  Atrial Fibrillation  Time Out: Verified patient identification, verified procedure,medications/allergies/relevent history reviewed, required imaging and test results available.  Performed  Procedure Details  The patient signed informed consent.   The patient was NPO past midnight. Has had therapeutic anticoagulation with ELiquis greater than 3 weeks. The patient denies any interruption of anticoagulation.  Anesthesia was administered by Dr. Nance Pew.  Adequate airway was maintained throughout and vital followed per protocol.  He was cardioverted x 1 with 200J of biphasic synchronized energy.  He converted to NSR.  There were no apparent complications.  The patient tolerated the procedure well and had normal neuro status and respiratory status post procedure with vitals stable as recorded elsewhere.     IMPRESSION:  Successful cardioversion of atrial fibrillation   Follow up:  He will be transferred to the medical floor.  He will continue on current medical therapy.  The patient advised to continue anticoagulation.  Cameron Roman 12/28/2022, 11:11 AM

## 2022-12-28 NOTE — Progress Notes (Addendum)
Patient reportedly converted back to a RVR earlier this morning and had been sustaining heart rates in the 120s.  Notified now that he has been in the 160s-190 and asx.  Will obtain EKG, give IV lopressor 5mg , and make n.p.o. in case he requires DCCV. EP team notified.

## 2022-12-28 NOTE — Progress Notes (Addendum)
Patient converted back to afib RVR. Hr sustaining in 120's. Notified MD  No new orders at this time.

## 2022-12-28 NOTE — Progress Notes (Signed)
   12/28/22 1921  Assess: MEWS Score  Temp 97.9 F (36.6 C)  BP 114/78  MAP (mmHg) 90  Pulse Rate (!) 144  ECG Heart Rate (!) 144  Resp 18  SpO2 97 %  O2 Device Room Air  Assess: MEWS Score  MEWS Temp 0  MEWS Systolic 0  MEWS Pulse 3  MEWS RR 0  MEWS LOC 0  MEWS Score 3  MEWS Score Color Yellow  Assess: if the MEWS score is Yellow or Red  Were vital signs accurate and taken at a resting state? Yes  Does the patient meet 2 or more of the SIRS criteria? No  MEWS guidelines implemented  Yes, yellow  Treat  MEWS Interventions Considered administering scheduled or prn medications/treatments as ordered  Take Vital Signs  Increase Vital Sign Frequency  Yellow: Q2hr x1, continue Q4hrs until patient remains green for 12hrs  Escalate  MEWS: Escalate Yellow: Discuss with charge nurse and consider notifying provider and/or RRT  Notify: Charge Nurse/RN  Name of Charge Nurse/RN Notified Deborah, RN  Assess: SIRS CRITERIA  SIRS Temperature  0  SIRS Pulse 1  SIRS Respirations  0  SIRS WBC 0  SIRS Score Sum  1

## 2022-12-28 NOTE — Progress Notes (Signed)
Rounding Note    Patient Name: Cameron Roman Date of Encounter: 12/28/2022  [facility] Cardiologist: None   Subjective   Disappointed to be back in AF, family bedside  Inpatient Medications    Scheduled Meds:  apixaban  5 mg Oral BID   dofetilide  125 mcg Oral BID   fluticasone  1 spray Each Nare q AM   fluticasone furoate-vilanterol  1 puff Inhalation Daily   And   umeclidinium bromide  1 puff Inhalation Daily   gabapentin  300 mg Oral TID   influenza vaccine adjuvanted  0.5 mL Intramuscular Tomorrow-1000   pantoprazole  40 mg Oral Daily   pravastatin  20 mg Oral Daily   sodium chloride flush  3 mL Intravenous Q12H   tamsulosin  0.4 mg Oral Daily   Continuous Infusions:  PRN Meds: HYDROcodone-acetaminophen, sodium chloride flush   Vital Signs    Vitals:   12/27/22 2221 12/28/22 0607 12/28/22 0717 12/28/22 0741  BP: 125/78 123/83 110/66 115/68  Pulse: 70 64  75  Resp: 18   18  Temp: 98.2 F (36.8 C)   98 F (36.7 C)  TempSrc: Oral   Oral  SpO2: 92% 95%  91%  Weight:      Height:        Intake/Output Summary (Last 24 hours) at 12/28/2022 0746 Last data filed at 12/27/2022 2000 Gross per 24 hour  Intake 240 ml  Output --  Net 240 ml      12/26/2022    4:00 PM 12/26/2022   10:53 AM 12/22/2022   12:58 PM  Last 3 Weights  Weight (lbs) 148 lb 12.8 oz 148 lb 3.2 oz 149 lb  Weight (kg) 67.495 kg 67.223 kg 67.586 kg      Telemetry    SR >> AFib last night 130's-150's - Personally Reviewed  ECG    SR 75bpm, QTc (post dose) This AM is AFib 160, QTc 476  - Personally Reviewed w/Dr. Jimmey Ralph  Physical Exam   GEN: No acute distress.   Neck: No JVD Cardiac: irreg-irreg, tachycardic, no murmurs, rubs, or gallops.  Respiratory: CTA b/l. GI: Soft, nontender, non-distended  MS: No edema; No deformity. Neuro:  Nonfocal  Psych: Normal affect   Labs    High Sensitivity Troponin:  No results for input(s): "TROPONINIHS" in the last  720 hours.   Chemistry Recent Labs  Lab 12/26/22 1112 12/27/22 0600 12/28/22 0504  NA 135 137 137  K 3.9 4.6 4.1  CL 107 106 104  CO2 19* 22 21*  GLUCOSE 118* 94 101*  BUN 16 12 12   CREATININE 1.12 0.92 0.80  CALCIUM 8.6* 8.8* 9.1  MG 2.0 2.2 2.0  GFRNONAA >60 >60 >60  ANIONGAP 9 9 12     Lipids No results for input(s): "CHOL", "TRIG", "HDL", "LABVLDL", "LDLCALC", "CHOLHDL" in the last 168 hours.  HematologyNo results for input(s): "WBC", "RBC", "HGB", "HCT", "MCV", "MCH", "MCHC", "RDW", "PLT" in the last 168 hours. Thyroid No results for input(s): "TSH", "FREET4" in the last 168 hours.  BNPNo results for input(s): "BNP", "PROBNP" in the last 168 hours.  DDimer No results for input(s): "DDIMER" in the last 168 hours.   Radiology      Cardiac Studies   12/27/22: TTE 1. Left ventricular ejection fraction, by estimation, is 40 to 45%. The  left ventricle has mildly decreased function. The left ventricle  demonstrates global hypokinesis. Left ventricular diastolic parameters are  indeterminate. Elevated left atrial  pressure.   2. Right ventricular systolic function is normal. The right ventricular  size is normal. There is mildly elevated pulmonary artery systolic  pressure. The estimated right ventricular systolic pressure is 42.4 mmHg.   3. Left atrial size was mildly dilated.   4. The mitral valve is normal in structure. At least moderate mitral  valve regurgitation, moderate to severe in some R-R intervals with TVI  ratio 1.36. Regurgitation appears worst in the three chamber view.   5. The aortic valve is tricuspid. There is mild thickening of the aortic  valve. Aortic valve regurgitation is not visualized. No aortic stenosis is  present.   Comparison(s): LVEF worse from historic reporting.   Echo 09/13/15 demonstrated  - Left ventricle: The cavity size was normal. Wall thickness was    normal. Systolic function was normal. The estimated ejection    fraction was in  the range of 55% to 60%. Wall motion was normal;    there were no regional wall motion abnormalities. Doppler    parameters are consistent with abnormal left ventricular    relaxation (grade 1 diastolic dysfunction).  - Aortic root: The aortic root was mildly dilated.  - Atrial septum: There was increased thickness of the septum,    consistent with lipomatous hypertrophy.   Impressions:   - Normal LV systolic function; grade 1 diastolic dysfunction;    mildly dilated aortic root.   Patient Profile     72 y.o. male stroke, COPD, lung ca, AFib admitted for Tikosyn   Assessment & Plan    Persistent AFib CHA2DS2Vasc is 3, on Eliquis Tikosyn load is in progress K+ 4.1 Mag 2.0 Creat 0.80 (stable) QTc is long, EKG reviewed with Dr. Jimmey Ralph  Back in rapid AFib  Will reduce dose to Plan for DCCV today, Dr. Jimmey Ralph d/w the patient, he is agreeable  Informed Consent   Shared Decision Making/Informed Consent{ The risks (stroke, cardiac arrhythmias rarely resulting in the need for a temporary or permanent pacemaker, skin irritation or burns and complications associated with conscious sedation including aspiration, arrhythmia, respiratory failure and death), benefits (restoration of normal sinus rhythm) and alternatives of a direct current cardioversion were explained in detail to Cameron Roman and he agrees to proceed.    COPD Home regime  3. Chronic pain Pt requests his gabapentin be TID as prescribed for (not BID as reported) Home Norco reordered for Q 8 PRN  4. CM Likely 2/2 AFib and RVR Suspect will improve if we are able to maintain SR Not volume OL   For questions or updates, please contact [facility] Please consult [email or web address] for contact info under        Signed, Sheilah Pigeon, PA-C  12/28/2022, 7:46 AM

## 2022-12-28 NOTE — Anesthesia Postprocedure Evaluation (Signed)
Anesthesia Post Note  Patient: Cameron Roman  Procedure(s) Performed: CARDIOVERSION     Patient location during evaluation: PACU Anesthesia Type: General Level of consciousness: awake and alert Pain management: pain level controlled Vital Signs Assessment: post-procedure vital signs reviewed and stable Respiratory status: spontaneous breathing, nonlabored ventilation, respiratory function stable and patient connected to nasal cannula oxygen Cardiovascular status: blood pressure returned to baseline and stable Postop Assessment: no apparent nausea or vomiting Anesthetic complications: no   No notable events documented.  Last Vitals:  Vitals:   12/28/22 1142 12/28/22 1213  BP: 112/78 118/76  Pulse: 67 69  Resp: 14 18  Temp: 36.6 C 36.9 C  SpO2: 95% 96%    Last Pain:  Vitals:   12/28/22 1346  TempSrc:   PainSc: 5                  [name] P Yves Fodor

## 2022-12-28 NOTE — Transfer of Care (Signed)
Immediate Anesthesia Transfer of Care Note  Patient: [name]  Procedure(s) Performed: CARDIOVERSION  Patient Location: Cath Lab  Anesthesia Type:General  Level of Consciousness: drowsy  Airway & Oxygen Therapy: Patient Spontanous Breathing and Patient connected to nasal cannula oxygen  Post-op Assessment: Report given to RN and Post -op Vital signs reviewed and stable  Post vital signs: Reviewed and stable  Last Vitals:  Vitals Value Taken Time  BP 90/67 12/28/22 1109  Temp    Pulse 67 12/28/22 1111  Resp 21 12/28/22 1111  SpO2 96 % 12/28/22 1111  Vitals shown include unfiled device data.  Last Pain:  Vitals:   12/28/22 0810  TempSrc:   PainSc: Asleep      Patients Stated Pain Goal: 0 (12/27/22 0800)  Complications: No notable events documented.

## 2022-12-28 NOTE — Care Management (Signed)
12-28-22 1652 Patient presented for Tikosyn Load. Case Manager spoke with the patient regarding co pay cost. Patient is agreeable to cost and would like to have the initial Rx filled via [facility] and the Rx refills 90 day supply escribed to [facility] in [place], [place]. No further needs identified at this time

## 2022-12-28 NOTE — Progress Notes (Signed)
Pt went back in to A-fib, 12-lead EKG done; attempted to notify Brion Aliment, NP.   Bari Edward, RN

## 2022-12-28 NOTE — Progress Notes (Signed)
Pt's Qtc 519 on 12-lead EKG done at 1618, evening Tikosyn dose held per verbal order from [place], MD. Cardizem gtt initiated.  Bari Edward, RN

## 2022-12-28 NOTE — Progress Notes (Addendum)
Asked to see for recurrent PAF No distress had DCC on low dose Tikosyn today Rates 140's BP 118 systolic  He is on no AV nodal drugs.  EF is 40-45% by TTE reviewed   He is on Eliquis for CHADVASC 3  He has COPD and history of non small cell lung cancer No wheezing or respiratory distress  Also prior cryptogenic stroke with ILR placed  Exam with thin chronically ill male Normal lung sounds no wheezing No murmur JVP normal  Abdomen benign  No edema  ECG rapid afib no acute ST changes today  K 4.1 Cr 0.8 Mg 2.0  Plan :  start cardizem drip iv continue tikosyn dose limited by QT Continue eliquis  Time:  reviewing chart TTE, meds patient exam and orders 30 minutes   Charlton Haws MD [facility]

## 2022-12-29 ENCOUNTER — Encounter (HOSPITAL_COMMUNITY): Payer: Self-pay | Admitting: Cardiology

## 2022-12-29 ENCOUNTER — Other Ambulatory Visit: Payer: Self-pay

## 2022-12-29 DIAGNOSIS — I4819 Other persistent atrial fibrillation: Secondary | ICD-10-CM | POA: Diagnosis not present

## 2022-12-29 LAB — TROPONIN I (HIGH SENSITIVITY)
Troponin I (High Sensitivity): 20 ng/L — ABNORMAL HIGH (ref <18)
Troponin I (High Sensitivity): 17 ng/L (ref <18)

## 2022-12-29 LAB — BASIC METABOLIC PANEL
Anion gap: 14 (ref 5–15)
BUN: 15 mg/dL (ref 8–23)
CO2: 24 mmol/L (ref 22–32)
Calcium: 10.3 mg/dL (ref 8.9–10.3)
Chloride: 100 mmol/L (ref 98–111)
Creatinine, Ser: 0.85 mg/dL (ref 0.61–1.24)
GFR, Estimated: >60 mL/min (ref >60)
Glucose, Bld: 111 mg/dL — ABNORMAL HIGH (ref 70–99)
Potassium: 4.5 mmol/L (ref 3.5–5.1)
Sodium: 138 mmol/L (ref 135–145)

## 2022-12-29 LAB — MAGNESIUM: Magnesium: 2.1 mg/dL (ref 1.7–2.4)

## 2022-12-29 MED ORDER — AMIODARONE HCL 200 MG PO TABS
400.0000 mg | ORAL_TABLET | Freq: Two times a day (BID) | ORAL | Status: DC
  Administered 2022-12-29 (×2): 400 mg via ORAL
  Filled 2022-12-29 (×2): qty 2

## 2022-12-29 MED ORDER — DILTIAZEM HCL ER COATED BEADS 180 MG PO CP24
180.0000 mg | ORAL_CAPSULE | Freq: Every day | ORAL | Status: DC
  Administered 2022-12-29: 180 mg via ORAL
  Filled 2022-12-29 (×2): qty 1

## 2022-12-29 NOTE — Progress Notes (Addendum)
Rounding Note    Patient Name: Cameron Roman Date of Encounter: 12/29/2022  [facility] Cardiologist: None   Subjective   Eating breakfast, comfortable  Inpatient Medications    Scheduled Meds:  amiodarone  400 mg Oral BID   apixaban  5 mg Oral BID   fluticasone  1 spray Each Nare q AM   fluticasone furoate-vilanterol  1 puff Inhalation Daily   And   umeclidinium bromide  1 puff Inhalation Daily   gabapentin  300 mg Oral TID   influenza vaccine adjuvanted  0.5 mL Intramuscular Tomorrow-1000   pantoprazole  40 mg Oral Daily   pravastatin  20 mg Oral Daily   sodium chloride flush  3 mL Intravenous Q12H   tamsulosin  0.4 mg Oral Daily   Continuous Infusions:  diltiazem (CARDIZEM) infusion 7.5 mg/hr (12/29/22 0430)   PRN Meds: calcium carbonate, HYDROcodone-acetaminophen, sodium chloride flush   Vital Signs    Vitals:   12/28/22 2325 12/29/22 0207 12/29/22 0406 12/29/22 0722  BP: 104/73 103/66 101/63   Pulse: (!) 103 (!) 40 (!) 47   Resp: 18  18   Temp: 98.4 F (36.9 C)  98 F (36.7 C)   TempSrc: Oral  Oral   SpO2: 92% 93% 92% 94%  Weight:      Height:        Intake/Output Summary (Last 24 hours) at 12/29/2022 0910 Last data filed at 12/29/2022 0328 Gross per 24 hour  Intake 401.6 ml  Output --  Net 401.6 ml      12/26/2022    4:00 PM 12/26/2022   10:53 AM 12/22/2022   12:58 PM  Last 3 Weights  Weight (lbs) 148 lb 12.8 oz 148 lb 3.2 oz 149 lb  Weight (kg) 67.495 kg 67.223 kg 67.586 kg      Telemetry    SR >> AFib again last night 130's-150's - Personally Reviewed  ECG    AFib 108bpm  - Personally Reviewed   Physical Exam   GEN: No acute distress.   Neck: No JVD Cardiac: irreg-irreg, tachycardic, no murmurs, rubs, or gallops.  Respiratory: CTA b/l. GI: Soft, nontender, non-distended  MS: No edema; No deformity. Neuro:  Nonfocal  Psych: Normal affect   Labs    High Sensitivity Troponin:  No results for input(s):  "TROPONINIHS" in the last 720 hours.   Chemistry Recent Labs  Lab 12/27/22 0600 12/28/22 0504 12/29/22 0408  NA 137 137 138  K 4.6 4.1 4.5  CL 106 104 100  CO2 22 21* 24  GLUCOSE 94 101* 111*  BUN 12 12 15   CREATININE 0.92 0.80 0.85  CALCIUM 8.8* 9.1 10.3  MG 2.2 2.0 2.1  GFRNONAA >60 >60 >60  ANIONGAP 9 12 14     Lipids No results for input(s): "CHOL", "TRIG", "HDL", "LABVLDL", "LDLCALC", "CHOLHDL" in the last 168 hours.  HematologyNo results for input(s): "WBC", "RBC", "HGB", "HCT", "MCV", "MCH", "MCHC", "RDW", "PLT" in the last 168 hours. Thyroid No results for input(s): "TSH", "FREET4" in the last 168 hours.  BNPNo results for input(s): "BNP", "PROBNP" in the last 168 hours.  DDimer No results for input(s): "DDIMER" in the last 168 hours.   Radiology      Cardiac Studies   12/27/22: TTE 1. Left ventricular ejection fraction, by estimation, is 40 to 45%. The  left ventricle has mildly decreased function. The left ventricle  demonstrates global hypokinesis. Left ventricular diastolic parameters are  indeterminate. Elevated left atrial  pressure.  2. Right ventricular systolic function is normal. The right ventricular  size is normal. There is mildly elevated pulmonary artery systolic  pressure. The estimated right ventricular systolic pressure is 42.4 mmHg.   3. Left atrial size was mildly dilated.   4. The mitral valve is normal in structure. At least moderate mitral  valve regurgitation, moderate to severe in some R-R intervals with TVI  ratio 1.36. Regurgitation appears worst in the three chamber view.   5. The aortic valve is tricuspid. There is mild thickening of the aortic  valve. Aortic valve regurgitation is not visualized. No aortic stenosis is  present.   Comparison(s): LVEF worse from historic reporting.   Echo 09/13/15 demonstrated  - Left ventricle: The cavity size was normal. Wall thickness was    normal. Systolic function was normal. The estimated  ejection    fraction was in the range of 55% to 60%. Wall motion was normal;    there were no regional wall motion abnormalities. Doppler    parameters are consistent with abnormal left ventricular    relaxation (grade 1 diastolic dysfunction).  - Aortic root: The aortic root was mildly dilated.  - Atrial septum: There was increased thickness of the septum,    consistent with lipomatous hypertrophy.   Impressions:   - Normal LV systolic function; grade 1 diastolic dysfunction;    mildly dilated aortic root.   Patient Profile     72 y.o. male stroke, COPD, lung ca, AFib admitted for Tikosyn   Assessment & Plan    Persistent AFib CHA2DS2Vasc is 3, on Eliquis Tikosyn load failed QT post DCCV yesterday on dose was borderline though planned to proeed Unfortunately he had recurrent Afib w/RVR and felt to have failed drug at this juncture  D/w Dr. Ladona Ridgel, he will see him later this morning Will plan to start amiodarone as a bridge to Afib ablation Discussed with patient, sone at bedside    COPD Home regime  3. Chronic pain Pt requests his gabapentin be TID as prescribed for (not BID as reported) Home Norco reordered for Q 8 PRN  4. CM Likely 2/2 AFib and RVR Suspect will improve if we are able to maintain SR Not volume OL   For questions or updates, please contact [facility] Please consult [email or web address] for contact info under     Signed, Sheilah Pigeon, PA-C  12/29/2022, 9:10 AM     EP Attending  Patient seen and examined. Agree with the findings as noted above. He has returned to atrial fib with a controlled VR on IV dilt. He was started on amiodarone. He will be transitioned to oral cardizem and amio and plan for DC home tomorrow if his rates are controlled. Follow up with Dr. Jimmey Ralph to consider afib ablation.   Lewayne Bunting, MD

## 2022-12-29 NOTE — Progress Notes (Signed)
Pharmacy: Dofetilide (Tikosyn) - Follow Up Assessment and Electrolyte Replacement  Pharmacy consulted to assist in monitoring and replacing electrolytes in this 72 y.o. male admitted on 12/26/2022 undergoing dofetilide initiation. First dofetilide dose: 10/14 PM  Labs:    Component Value Date/Time   K 4.5 12/29/2022 0408   MG 2.1 12/29/2022 0408     Plan: Potassium: K >/= 4: No additional supplementation needed  Magnesium: Mg > 2: No additional supplementation needed   Thank you for allowing pharmacy to participate in this patient's care   Carron Brazen 12/29/2022  7:13 AM

## 2022-12-29 NOTE — Plan of Care (Signed)

## 2022-12-29 NOTE — Progress Notes (Signed)
Pt complained of 4/10 CP, pt stated that the pain started yesterday after DCCV when he went back into A-fib, HR is now sustaining in the 80's; notified Orson Aloe, MD. 12-lead EKG obtained, see new orders. Will continue to monitor.  Bari Edward, RN

## 2022-12-30 DIAGNOSIS — I4819 Other persistent atrial fibrillation: Secondary | ICD-10-CM | POA: Diagnosis not present

## 2022-12-30 LAB — BASIC METABOLIC PANEL
Anion gap: 11 (ref 5–15)
BUN: 14 mg/dL (ref 8–23)
CO2: 24 mmol/L (ref 22–32)
Calcium: 9.7 mg/dL (ref 8.9–10.3)
Chloride: 99 mmol/L (ref 98–111)
Creatinine, Ser: 0.94 mg/dL (ref 0.61–1.24)
GFR, Estimated: >60 mL/min (ref >60)
Glucose, Bld: 158 mg/dL — ABNORMAL HIGH (ref 70–99)
Potassium: 4.2 mmol/L (ref 3.5–5.1)
Sodium: 134 mmol/L — ABNORMAL LOW (ref 135–145)

## 2022-12-30 LAB — MAGNESIUM: Magnesium: 2 mg/dL (ref 1.7–2.4)

## 2022-12-30 MED ORDER — AMIODARONE HCL IN DEXTROSE 360-4.14 MG/200ML-% IV SOLN
60.0000 mg/h | INTRAVENOUS | Status: DC
  Filled 2022-12-30: qty 200

## 2022-12-30 MED ORDER — AMIODARONE HCL 200 MG PO TABS
400.0000 mg | ORAL_TABLET | Freq: Three times a day (TID) | ORAL | Status: DC
  Administered 2022-12-30 – 2022-12-31 (×3): 400 mg via ORAL
  Filled 2022-12-30 (×3): qty 2

## 2022-12-30 MED ORDER — DILTIAZEM HCL ER COATED BEADS 240 MG PO CP24: 240.0000 mg | ORAL_CAPSULE | Freq: Every day | ORAL | Status: DC

## 2022-12-30 MED ORDER — DILTIAZEM HCL ER COATED BEADS 240 MG PO CP24
240.0000 mg | ORAL_CAPSULE | Freq: Every day | ORAL | Status: DC
  Administered 2022-12-30: 240 mg via ORAL
  Filled 2022-12-30: qty 1

## 2022-12-30 MED ORDER — AMIODARONE HCL IN DEXTROSE 360-4.14 MG/200ML-% IV SOLN: 30.0000 mg/h | INTRAVENOUS | Status: DC

## 2022-12-30 MED ORDER — AMIODARONE LOAD VIA INFUSION
150.0000 mg | Freq: Once | INTRAVENOUS | Status: DC
  Filled 2022-12-30: qty 83.34

## 2022-12-30 NOTE — Progress Notes (Addendum)
Rounding Note    Patient Name: Cameron Roman Date of Encounter: 12/30/2022  [facility] Cardiologist: None   Subjective   Eating breakfast, reports chest is heavy, whenever he is in AFib, started right up as soon as he went back into AF post DCCV.  Reports several months of the same  Inpatient Medications    Scheduled Meds:  amiodarone  150 mg Intravenous Once   apixaban  5 mg Oral BID   diltiazem  180 mg Oral Daily   fluticasone  1 spray Each Nare q AM   fluticasone furoate-vilanterol  1 puff Inhalation Daily   And   umeclidinium bromide  1 puff Inhalation Daily   gabapentin  300 mg Oral TID   pantoprazole  40 mg Oral Daily   pravastatin  20 mg Oral Daily   sodium chloride flush  3 mL Intravenous Q12H   tamsulosin  0.4 mg Oral Daily   Continuous Infusions:  amiodarone     Followed by   amiodarone     PRN Meds: calcium carbonate, HYDROcodone-acetaminophen, sodium chloride flush   Vital Signs    Vitals:   12/29/22 1216 12/29/22 2202 12/30/22 0055 12/30/22 0514  BP: 103/74 118/75 107/75 111/71  Pulse: 94 81 (!) 101 (!) 59  Resp: 16 16 19 18   Temp: (!) 97.5 F (36.4 C) 98.4 F (36.9 C) 98.4 F (36.9 C) 98.1 F (36.7 C)  TempSrc: Oral Oral Oral Oral  SpO2: 95% 95% 96% 91%  Weight:      Height:        Intake/Output Summary (Last 24 hours) at 12/30/2022 0845 Last data filed at 12/29/2022 1216 Gross per 24 hour  Intake 31.99 ml  Output --  Net 31.99 ml      12/26/2022    4:00 PM 12/26/2022   10:53 AM 12/22/2022   12:58 PM  Last 3 Weights  Weight (lbs) 148 lb 12.8 oz 148 lb 3.2 oz 149 lb  Weight (kg) 67.495 kg 67.223 kg 67.586 kg      Telemetry    AFib 80's-130's- Personally Reviewed  ECG    AGib 93bpm, no acute or ischemic changes  - Personally Reviewed   Physical Exam   Unchanged exam today GEN: No acute distress.   Neck: No JVD Cardiac: irreg-irreg, tachycardic, no murmurs, rubs, or gallops.  Respiratory: CTA b/l. GI:  Soft, nontender, non-distended  MS: No edema; No deformity. Neuro:  Nonfocal  Psych: Normal affect   Labs    High Sensitivity Troponin:   Recent Labs  Lab 12/29/22 2113 12/29/22 2240  TROPONINIHS 20* 17     Chemistry Recent Labs  Lab 12/27/22 0600 12/28/22 0504 12/29/22 0408  NA 137 137 138  K 4.6 4.1 4.5  CL 106 104 100  CO2 22 21* 24  GLUCOSE 94 101* 111*  BUN 12 12 15   CREATININE 0.92 0.80 0.85  CALCIUM 8.8* 9.1 10.3  MG 2.2 2.0 2.1  GFRNONAA >60 >60 >60  ANIONGAP 9 12 14     Lipids No results for input(s): "CHOL", "TRIG", "HDL", "LABVLDL", "LDLCALC", "CHOLHDL" in the last 168 hours.  HematologyNo results for input(s): "WBC", "RBC", "HGB", "HCT", "MCV", "MCH", "MCHC", "RDW", "PLT" in the last 168 hours. Thyroid No results for input(s): "TSH", "FREET4" in the last 168 hours.  BNPNo results for input(s): "BNP", "PROBNP" in the last 168 hours.  DDimer No results for input(s): "DDIMER" in the last 168 hours.   Radiology      Cardiac  Studies   12/27/22: TTE 1. Left ventricular ejection fraction, by estimation, is 40 to 45%. The  left ventricle has mildly decreased function. The left ventricle  demonstrates global hypokinesis. Left ventricular diastolic parameters are  indeterminate. Elevated left atrial  pressure.   2. Right ventricular systolic function is normal. The right ventricular  size is normal. There is mildly elevated pulmonary artery systolic  pressure. The estimated right ventricular systolic pressure is 42.4 mmHg.   3. Left atrial size was mildly dilated.   4. The mitral valve is normal in structure. At least moderate mitral  valve regurgitation, moderate to severe in some R-R intervals with TVI  ratio 1.36. Regurgitation appears worst in the three chamber view.   5. The aortic valve is tricuspid. There is mild thickening of the aortic  valve. Aortic valve regurgitation is not visualized. No aortic stenosis is  present.   Comparison(s): LVEF  worse from historic reporting.   Echo 09/13/15 demonstrated  - Left ventricle: The cavity size was normal. Wall thickness was    normal. Systolic function was normal. The estimated ejection    fraction was in the range of 55% to 60%. Wall motion was normal;    there were no regional wall motion abnormalities. Doppler    parameters are consistent with abnormal left ventricular    relaxation (grade 1 diastolic dysfunction).  - Aortic root: The aortic root was mildly dilated.  - Atrial septum: There was increased thickness of the septum,    consistent with lipomatous hypertrophy.   Impressions:   - Normal LV systolic function; grade 1 diastolic dysfunction;    mildly dilated aortic root.   Patient Profile     72 y.o. male stroke, COPD, lung ca, AFib (has a ILR in place), admitted for Tikosyn   Assessment & Plan    Persistent AFib CHA2DS2Vasc is 3, on Eliquis Tikosyn load failed QT post DCCV 10/16 on dose was borderline though planned to proeed Unfortunately he had recurrent Afib w/RVR on the dose and felt to have failed drug at this juncture  Planned for amiodarone bridge to PVI ablation. He feels poorly even in rate controlled AFib Heavy in his chest more SOB HS Trop low and flat, no known CAD, I don't find a prior ischemic eval  Capri Raben start amio gtt this AM EP MD to see, if patient willing to stay consider getting him loaded on amio and try DCCV in a couple days     COPD Home regime  3. Chronic pain Pt requests his gabapentin be TID as prescribed for (not BID as reported) Home Norco reordered for Q 8 PRN  4. CM Likely 2/2 AFib and RVR Suspect Zarina Pe improve if we are able to maintain SR Not volume OL In d/w Dr. Ladona Ridgel yesterday, OK for dilt   For questions or updates, please contact [facility] Please consult [email or web address] for contact info under     Signed, Sheilah Pigeon, PA-C  12/30/2022, 8:45 AM     I have seen and examined this  patient with Francis Dowse.  Agree with above, note added to reflect my findings.  Patient remains in atrial fibrillation.  Complaining of shortness of breath and chest pain.  Hide troponins and EKG yesterday which were unrevealing.  He felt well in sinus rhythm.  GEN: Well nourished, well developed, in no acute distress  HEENT: normal  Neck: no JVD, carotid bruits, or masses Cardiac: irregualr; no murmurs, rubs, or gallops,no edema  Respiratory:  clear to auscultation bilaterally, normal work of breathing GI: soft, nontender, nondistended, + BS MS: no deformity or atrophy  Skin: warm and dryNeuro:  Strength and sensation are intact Psych: euthymic mood, full affect   Persistent atrial fibrillation: Failed dofetilide load.  He feels poorly in atrial fibrillation with chest pain.  Felt well in sinus rhythm.  Angelina Venard load amiodarone p.o. as there is an IV bag shortage.  Maeli Spacek likely plan for cardioversion next week. 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  Plan for rhythm control.    Yousra Ivens M. Ethan Clayburn MD 12/30/2022 10:20 AM

## 2022-12-30 NOTE — Plan of Care (Signed)
CH[facility] Tonsillectomy/Adenoidectomy, Postoperative PEDS care plan entered in error.

## 2022-12-30 NOTE — Progress Notes (Signed)
Patient requests his home  Prilosec, though we do not carry it here Advised the pt that they can bring his home supply for home  I have asked  pharmacy consult to assist with that process, RN is aware  Pt asked at time of admission about his omeprazole, that he takes both at home, I told him that we had prescribed his protonix.  He and his son wonder if his CP could be acid reflux because he had not been getting his omeprazole, though he definitely connects his discomfort to AFib and goes back 5 months or so by the pt's report.   Cameron Dowse, PA-C

## 2022-12-31 ENCOUNTER — Encounter (HOSPITAL_COMMUNITY): Payer: Self-pay | Admitting: Cardiology

## 2022-12-31 ENCOUNTER — Other Ambulatory Visit (HOSPITAL_COMMUNITY): Payer: Self-pay

## 2022-12-31 DIAGNOSIS — I420 Dilated cardiomyopathy: Secondary | ICD-10-CM

## 2022-12-31 DIAGNOSIS — R072 Precordial pain: Secondary | ICD-10-CM | POA: Insufficient documentation

## 2022-12-31 DIAGNOSIS — I4819 Other persistent atrial fibrillation: Secondary | ICD-10-CM | POA: Diagnosis not present

## 2022-12-31 HISTORY — DX: Dilated cardiomyopathy: I42.0

## 2022-12-31 LAB — BASIC METABOLIC PANEL
Anion gap: 11 (ref 5–15)
BUN: 12 mg/dL (ref 8–23)
CO2: 26 mmol/L (ref 22–32)
Calcium: 10.1 mg/dL (ref 8.9–10.3)
Chloride: 97 mmol/L — ABNORMAL LOW (ref 98–111)
Creatinine, Ser: 0.86 mg/dL (ref 0.61–1.24)
GFR, Estimated: >60 mL/min (ref >60)
Glucose, Bld: 121 mg/dL — ABNORMAL HIGH (ref 70–99)
Potassium: 4.3 mmol/L (ref 3.5–5.1)
Sodium: 134 mmol/L — ABNORMAL LOW (ref 135–145)

## 2022-12-31 LAB — MAGNESIUM: Magnesium: 2 mg/dL (ref 1.7–2.4)

## 2022-12-31 MED ORDER — AMIODARONE HCL 200 MG PO TABS
200.0000 mg | ORAL_TABLET | Freq: Two times a day (BID) | ORAL | 3 refills | Status: DC
  Filled 2022-12-31: qty 180, 90d supply, fill #0

## 2022-12-31 MED ORDER — AMIODARONE HCL 200 MG PO TABS: 200.0000 mg | ORAL_TABLET | Freq: Two times a day (BID) | ORAL | Status: DC

## 2022-12-31 MED ORDER — DILTIAZEM HCL ER COATED BEADS 180 MG PO CP24
300.0000 mg | ORAL_CAPSULE | Freq: Every day | ORAL | Status: DC
  Administered 2022-12-31: 300 mg via ORAL
  Filled 2022-12-31: qty 1

## 2022-12-31 MED ORDER — DILTIAZEM HCL ER COATED BEADS 300 MG PO CP24
300.0000 mg | ORAL_CAPSULE | Freq: Every day | ORAL | 3 refills | Status: DC
  Filled 2022-12-31: qty 90, 90d supply, fill #0

## 2022-12-31 MED ORDER — GABAPENTIN 300 MG PO CAPS: 300.0000 mg | ORAL_CAPSULE | Freq: Two times a day (BID) | ORAL | Status: DC

## 2022-12-31 NOTE — Discharge Instructions (Addendum)
If you need your medications refilled from your regular pharmacy in the future, contact your pharmacy and ask them to transfer your medications from the [facility] Transitions of Care/Outpatient Pharmacy.  Our office will arrange an appointment with Dr. Nobie Putnam to discuss arranging an ablation for atrial fibrillation in the future.

## 2022-12-31 NOTE — Progress Notes (Signed)
   He had nausea and vomiting today after his morning medications, had some nausea yesterday but no vomiting.  Feels that the vomiting was caused by taking the amiodarone on an empty stomach.  Spoke with Dr. Elberta Fortis, decrease the amiodarone to 200 mg twice daily and okay for discharge.  Theodore Demark, PA-C 12/31/2022 10:05 AM

## 2022-12-31 NOTE — Discharge Summary (Addendum)
Discharge Summary    Patient ID: Cameron Roman MRN: [id number]; DOB: [birth year]  Admit date: 12/26/2022 Discharge date: 12/31/2022  PCP:  [name]   [facility] Providers Cardiologist:  None  Electrophysiologist:  Lewayne Bunting, MD       Discharge Diagnoses    Principal Problem:   Persistent atrial fibrillation [facility]) Active Problems:   Dilated cardiomyopathy (HCC)   Precordial chest pain   Diagnostic Studies/Procedures    ECHO COMPLETE WO IMAGING ENHANCING AGENT 12/27/2022 IMPRESSIONS 1. Left ventricular ejection fraction, by estimation, is 40 to 45%. The left ventricle has mildly decreased function. The left ventricle demonstrates global hypokinesis. Left ventricular diastolic parameters are indeterminate. Elevated left atrial pressure. 2. Right ventricular systolic function is normal. The right ventricular size is normal. There is mildly elevated pulmonary artery systolic pressure. The estimated right ventricular systolic pressure is 42.4 mmHg. 3. Left atrial size was mildly dilated. 4. The mitral valve is normal in structure. At least moderate mitral valve regurgitation, moderate to severe in some R-R intervals with TVI ratio 1.36. Regurgitation appears worst in the three chamber view. 5. The aortic valve is tricuspid. There is mild thickening of the aortic valve. Aortic valve regurgitation is not visualized. No aortic stenosis is present.    _____________   History of Present Illness     Cameron Roman is a 72 y.o. male with a history of cryptogenic stroke status post ILR implantation which demonstrated atrial fibrillation, non-small cell lung cancer, COPD, diabetes mellitus.  Cameron was placed on apixaban for anticoagulation.  Cameron was seen by Dr. Ladona Ridgel 12/22/2022 with persistent atrial fibrillation with rapid rate.  Dofetilide load was recommended.  Hospital Course     Consultants: None  Atrial fibrillation with rapid ventricular rate Cameron was  admitted 12/26/2022 and started on dofetilide 250 mcg twice daily.  His dose was adjusted secondary to prolonged QT.  Cameron converted to sinus rhythm after his first dose of dofetilide.  However, Cameron reverted back to atrial fibrillation with rapid rate.  Of note, Cameron is symptomatic with atrial fibrillation with chest heaviness.  Cameron underwent cardioversion for rapid atrial fibrillation and post cardioversion EKG demonstrated QTc of 500 ms.  Cameron was continued on dofetilide 125 mcg twice daily.  Cameron went back into atrial fibrillation on 12/28/2022 and was started on IV diltiazem.  It was felt that Cameron failed dofetilide and this was discontinued.  Cameron was placed on amiodarone as a bridge to atrial fibrillation ablation.  This morning, Cameron was evaluated by Dr. Elberta Roman.  Cameron remained in atrial fibrillation with heart rates in the 70s and 90s. Recommendation was to go home on amiodarone 400 mg twice daily and pursue close follow-up with the atrial fibrillation clinic to arrange repeat cardioversion.  Cameron Roman also need follow-up with the EP to arrange PVI ablation.  Of note, the patient had nausea and vomiting after taking amiodarone.  Therefore, the dose was reduced to 200 mg twice daily at discharge.  Chest pain The patient complained of chest pain while in atrial fibrillation with rapid rate.  Cameron has a history of this.  Troponins were obtained and were minimally elevated and flat, not consistent with ACS (20 >>17).  Dilated cardiomyopathy Follow-up echocardiogram this admission with reduced LV function with EF 40-45.  Reduced EF was felt to be likely due to tachycardia mediated cardiomyopathy.  Therefore, rhythm control was planned with amiodarone and diltiazem.  Cameron Roman eventually need follow-up assessment of his LV function  once Cameron is maintaining sinus rhythm.     Did the patient have an acute coronary syndrome (MI, NSTEMI, STEMI, etc) this admission?:  No                               Did the patient have a  percutaneous coronary intervention (stent / angioplasty)?:  No.        _____________  Discharge Vitals Blood pressure (!) 141/91, pulse 69, temperature 97.6 F (36.4 C), temperature source Oral, resp. rate 18, height 5\' 10"  (1.778 m), weight 67.5 kg, SpO2 94%.  Filed Weights   12/26/22 1600  Weight: 67.5 kg    Labs & Radiologic Studies     Basic Metabolic Panel Recent Labs    40/98/11 0954 12/31/22 0530  NA 134* 134*  K 4.2 4.3  CL 99 97*  CO2 24 26  GLUCOSE 158* 121*  BUN 14 12  CREATININE 0.94 0.86  CALCIUM 9.7 10.1  MG 2.0 2.0   High Sensitivity Troponin:   Recent Labs  Lab 12/29/22 2113 12/29/22 2240  TROPONINIHS 20* 17     _____________   Disposition   Pt is being discharged home today in good condition.  Follow-up Plans & Appointments     Follow-up Information     Fenton, Levonne Spiller R, PA Follow up on 01/10/2023.   Specialty: Cardiology Why: 3 pm - post hospital followup Contact information: [address] [place] [address] [phone]                Discharge Instructions     Diet - low sodium heart healthy   Complete by: As directed    Increase activity slowly   Complete by: As directed         Discharge Medications   Allergies as of 12/31/2022       Reactions   Percocet [oxycodone-acetaminophen] Itching        Medication List     TAKE these medications    amiodarone 200 MG tablet Commonly known as: PACERONE Take 1 tablet (200 mg total) by mouth 2 (two) times daily.   cyanocobalamin 1000 MCG/ML injection Commonly known as: VITAMIN B12 Inject into the muscle.   diltiazem 300 MG 24 hr capsule Commonly known as: CARDIZEM CD Take 1 capsule (300 mg total) by mouth daily. Start taking on: January 01, 2023   Eliquis 5 MG Tabs tablet Generic drug: apixaban TAKE ONE TABLET BY MOUTH TWICE DAILY   Fish Oil 1000 MG Caps Take 1,000 mg by mouth in the morning and at bedtime.   fluticasone 50 MCG/ACT nasal  spray Commonly known as: FLONASE Place 1 spray into the nose in the morning.   gabapentin 300 MG capsule Commonly known as: NEURONTIN Take 1 capsule (300 mg total) by mouth 2 (two) times daily.   HYDROcodone-acetaminophen 10-325 MG tablet Commonly known as: NORCO Take 1 tablet by mouth 3 (three) times daily.   omeprazole 20 MG capsule Commonly known as: PRILOSEC Take 1 capsule (20 mg total) by mouth daily.   pantoprazole 40 MG tablet Commonly known as: PROTONIX TAKE ONE (1) TABLET BY MOUTH EVERY DAY   pravastatin 20 MG tablet Commonly known as: PRAVACHOL TAKE ONE (1) TABLET BY MOUTH EVERY DAY   tamsulosin 0.4 MG Caps capsule Commonly known as: FLOMAX Take 1 capsule (0.4 mg total) by mouth daily.   Trelegy Ellipta 100-62.5-25 MCG/ACT Aepb Generic drug: Fluticasone-Umeclidin-Vilant Inhale 1 puff  into the lungs daily.   Trelegy Ellipta 100-62.5-25 MCG/ACT Aepb Generic drug: Fluticasone-Umeclidin-Vilant Inhale 1 puff into the lungs daily.           Outstanding Labs/Studies     Duration of Discharge Encounter   Greater than 30 minutes including physician time.  Signed, Cameron Newcomer, Cameron Roman 12/31/2022, 11:08 AM    I have seen and examined this patient with Cameron Roman.  Agree with above, note added to reflect my findings.  Patient admitted to the hospital for dofetilide load.  Unfortunately failed dofetilide.  Cameron Roman plan for amiodarone load as an outpatient and cardioversion.  Cameron Cameron Roman follow-up in clinic as Cameron to discuss possible ablation.  Cameron Roman, Cameron Roman, in no acute distress  HEENT: normal  Neck: no JVD, carotid bruits, or masses Cardiac: Irregular RRR; no murmurs, rubs, or gallops,no edema  Respiratory:  clear to auscultation bilaterally, normal work of breathing GI: soft, nontender, nondistended, + BS MS: no deformity or atrophy  Skin: warm and dry Neuro:  Strength and sensation are intact Psych: euthymic mood, full affect      Arianah Torgeson M. Dimetrius Montfort MD 12/31/2022 11:26 AM

## 2022-12-31 NOTE — Progress Notes (Signed)
Rounding Note    Patient Name: Cameron Roman Date of Encounter: 12/31/2022  [facility] Cardiologist: None   Subjective   Remains in atrial fibrillation.  Feeling improved with better rate control.  Inpatient Medications    Scheduled Meds:  amiodarone  400 mg Oral Q8H   apixaban  5 mg Oral BID   diltiazem  300 mg Oral Daily   fluticasone  1 spray Each Nare q AM   fluticasone furoate-vilanterol  1 puff Inhalation Daily   And   umeclidinium bromide  1 puff Inhalation Daily   gabapentin  300 mg Oral TID   pantoprazole  40 mg Oral Daily   pravastatin  20 mg Oral Daily   sodium chloride flush  3 mL Intravenous Q12H   tamsulosin  0.4 mg Oral Daily   Continuous Infusions:   PRN Meds: calcium carbonate, HYDROcodone-acetaminophen, sodium chloride flush   Vital Signs    Vitals:   12/30/22 1958 12/31/22 0029 12/31/22 0411 12/31/22 0809  BP: 114/60 103/71 108/68 (!) 128/98  Pulse: 63 97 92   Resp:      Temp: 98.4 F (36.9 C) 98.4 F (36.9 C) 98.4 F (36.9 C) 97.6 F (36.4 C)  TempSrc: Oral Oral Oral Oral  SpO2: 93% 94% 94%   Weight:      Height:       No intake or output data in the 24 hours ending 12/31/22 0911     12/26/2022    4:00 PM 12/26/2022   10:53 AM 12/22/2022   12:58 PM  Last 3 Weights  Weight (lbs) 148 lb 12.8 oz 148 lb 3.2 oz 149 lb  Weight (kg) 67.495 kg 67.223 kg 67.586 kg      Telemetry    Atrial fibrillation, heart rate 70s to 90s-personally reviewed  ECG    None new  Physical Exam   GEN: Well nourished, well developed, in no acute distress  HEENT: normal  Neck: no JVD, carotid bruits, or masses Cardiac: irregular; no murmurs, rubs, or gallops,no edema  Respiratory:  clear to auscultation bilaterally, normal work of breathing GI: soft, nontender, nondistended, + BS MS: no deformity or atrophy  Skin: warm and dry Neuro:  Strength and sensation are intact Psych: euthymic mood, full affect   Labs    High Sensitivity  Troponin:   Recent Labs  Lab 12/29/22 2113 12/29/22 2240  TROPONINIHS 20* 17     Chemistry Recent Labs  Lab 12/29/22 0408 12/30/22 0954 12/31/22 0530  NA 138 134* 134*  K 4.5 4.2 4.3  CL 100 99 97*  CO2 24 24 26   GLUCOSE 111* 158* 121*  BUN 15 14 12   CREATININE 0.85 0.94 0.86  CALCIUM 10.3 9.7 10.1  MG 2.1 2.0 2.0  GFRNONAA >60 >60 >60  ANIONGAP 14 11 11     Lipids No results for input(s): "CHOL", "TRIG", "HDL", "LABVLDL", "LDLCALC", "CHOLHDL" in the last 168 hours.  HematologyNo results for input(s): "WBC", "RBC", "HGB", "HCT", "MCV", "MCH", "MCHC", "RDW", "PLT" in the last 168 hours. Thyroid No results for input(s): "TSH", "FREET4" in the last 168 hours.  BNPNo results for input(s): "BNP", "PROBNP" in the last 168 hours.  DDimer No results for input(s): "DDIMER" in the last 168 hours.   Radiology      Cardiac Studies   12/27/22: TTE 1. Left ventricular ejection fraction, by estimation, is 40 to 45%. The  left ventricle has mildly decreased function. The left ventricle  demonstrates global hypokinesis. Left ventricular diastolic  parameters are  indeterminate. Elevated left atrial  pressure.   2. Right ventricular systolic function is normal. The right ventricular  size is normal. There is mildly elevated pulmonary artery systolic  pressure. The estimated right ventricular systolic pressure is 42.4 mmHg.   3. Left atrial size was mildly dilated.   4. The mitral valve is normal in structure. At least moderate mitral  valve regurgitation, moderate to severe in some R-R intervals with TVI  ratio 1.36. Regurgitation appears worst in the three chamber view.   5. The aortic valve is tricuspid. There is mild thickening of the aortic  valve. Aortic valve regurgitation is not visualized. No aortic stenosis is  present.   Comparison(s): LVEF worse from historic reporting.   Echo 09/13/15 demonstrated  - Left ventricle: The cavity size was normal. Wall thickness was     normal. Systolic function was normal. The estimated ejection    fraction was in the range of 55% to 60%. Wall motion was normal;    there were no regional wall motion abnormalities. Doppler    parameters are consistent with abnormal left ventricular    relaxation (grade 1 diastolic dysfunction).  - Aortic root: The aortic root was mildly dilated.  - Atrial septum: There was increased thickness of the septum,    consistent with lipomatous hypertrophy.   Impressions:   - Normal LV systolic function; grade 1 diastolic dysfunction;    mildly dilated aortic root.   Patient Profile     72 y.o. male stroke, COPD, lung ca, AFib (has a ILR in place), admitted for Tikosyn   Assessment & Plan    1.  Persistent atrial fibrillation: Currently on Eliquis.  Has failed dofetilide load.  Plan for amiodarone load.  400 mg twice daily until he sees A-fib clinic.  Lidia Clavijo need to see A-fib clinic next week.  2.  COPD: Continue home medicines  3.  Chronic pain: Continue home medicines  4.  Cardiomyopathy: Likely due to rapid atrial fibrillation.  Regie Bunner plan for rhythm control with amiodarone and rate control with current dose of diltiazem.   For questions or updates, please contact [facility] Please consult [email or web address] for contact info under     Signed, Mahdi Frye Jorja Loa, MD  12/31/2022, 9:11 AM

## 2023-01-02 ENCOUNTER — Telehealth: Payer: Self-pay

## 2023-01-02 NOTE — Transitions of Care (Post Inpatient/ED Visit) (Signed)
01/02/2023  Name: Cameron Roman MRN: [id number] DOB: Dec 16, 1950  Today's TOC FU Call Status: Today's TOC FU Call Status:: Unsuccessful Call (1st Attempt) Unsuccessful Call (1st Attempt) Date: 01/02/23  Attempted to reach the patient regarding the most recent Inpatient/ED visit.  Follow Up Plan: Additional outreach attempts will be made to reach the patient to complete the Transitions of Care (Post Inpatient/ED visit) call.   RNCM left a message for call back  Deidre Ala, RN RN Care Manager [facility] [phone]

## 2023-01-03 ENCOUNTER — Telehealth: Payer: Self-pay

## 2023-01-03 NOTE — Telephone Encounter (Signed)
Talked with patient he is taking the amiodarone with food. He is not happy on amiodarone. HRs are in the 70s with rest but will increase over 100 with activity. He will try to stay on amiodarone BID but if nausea worsens he will reduce amiodarone to 200mg  once a day. Pt verbalized agreement.

## 2023-01-03 NOTE — Transitions of Care (Post Inpatient/ED Visit) (Signed)
01/03/2023  Name: Cameron Roman MRN: [id number] DOB: 1950/11/18  Today's TOC FU Call Status: Today's TOC FU Call Status:: Successful TOC FU Call Completed Patient's Name and Date of Birth confirmed.  Transition Care Management Follow-up Telephone Call Date of Discharge: 12/31/22 Discharge Facility: [facility]) Type of Discharge: Inpatient Admission Primary Inpatient Discharge Diagnosis:: A-fib How have you been since you were released from the hospital?: Same Any questions or concerns?: Yes Patient Questions/Concerns:: Increase Nausea with taking the new medications Patient Questions/Concerns Addressed: Notified Provider of Patient Questions/Concerns  Items Reviewed: Did you receive and understand the discharge instructions provided?: Yes Medications obtained,verified, and reconciled?: Yes (Medications Reviewed) Any new allergies since your discharge?: No Dietary orders reviewed?: No Do you have support at home?: No  Medications Reviewed Today: Medications Reviewed Today     Reviewed by Redge Gainer, RN (Case Manager) on 01/03/23 at 1436  Med List Status: <None>   Medication Order Taking? Sig Documenting Provider Last Dose Status Informant  amiodarone (PACERONE) 200 MG tablet [id number]  Take 1 tablet (200 mg total) by mouth 2 (two) times daily. Tereso Newcomer T, PA-C  Active   cyanocobalamin (VITAMIN B12) 1000 MCG/ML injection [id number]  Inject into the muscle. [provider]  Active Self, Pharmacy Records  diltiazem (CARDIZEM CD) 300 MG 24 hr capsule [id number]  Take 1 capsule (300 mg total) by mouth daily. Beatrice Lecher, PA-C  Active   ELIQUIS 5 MG TABS tablet [id number]  TAKE ONE TABLET BY MOUTH TWICE DAILY Marinus Maw, MD  Active Self, Pharmacy Records  fluticasone [facility]) 50 MCG/ACT nasal spray [id number]  Place 1 spray into the nose in the morning. [provider]  Active Self, Pharmacy Records  Fluticasone-Umeclidin-Vilant (TRELEGY ELLIPTA)  100-62.5-25 MCG/ACT AEPB [id number]  Inhale 1 puff into the lungs daily. Hunsucker, Lesia Sago, MD  Active Self, Pharmacy Records  Fluticasone-Umeclidin-Vilant [facility] ELLIPTA) 100-62.5-25 MCG/ACT AEPB [id number]  Inhale 1 puff into the lungs daily. Hunsucker, Lesia Sago, MD  Consider Medication Status and Discontinue (No longer needed (for PRN medications)) Self, Pharmacy Records  gabapentin (NEURONTIN) 300 MG capsule [id number]  Take 1 capsule (300 mg total) by mouth 2 (two) times daily. Tereso Newcomer T, PA-C  Active   HYDROcodone-acetaminophen [facility]) 10-325 MG tablet [id number]  Take 1 tablet by mouth 3 (three) times daily. [provider]  Active Self, Pharmacy Records  Omega-3 Fatty Acids (FISH OIL) 1000 MG CAPS [id number]  Take 1,000 mg by mouth in the morning and at bedtime. [provider]  Active Self, Pharmacy Records  omeprazole (PRILOSEC) 20 MG capsule [id number]  Take 1 capsule (20 mg total) by mouth daily. Dettinger, Elige Radon, MD  Active Self, Pharmacy Records  pantoprazole (PROTONIX) 40 MG tablet [id number]  TAKE ONE (1) TABLET BY MOUTH EVERY DAY Hilarie Fredrickson, MD  Active Self, Pharmacy Records  pravastatin (PRAVACHOL) 20 MG tablet [id number]  TAKE ONE (1) TABLET BY MOUTH EVERY DAY  Patient not taking: Reported on 12/27/2022   Dettinger, Elige Radon, MD  Active Self, Pharmacy Records  tamsulosin [facility]) 0.4 MG CAPS capsule [id number]  Take 1 capsule (0.4 mg total) by mouth daily. Dettinger, Elige Radon, MD  Active Self, Pharmacy Records            Home Care and Equipment/Supplies: Were Home Health Services Ordered?: NA Any new equipment or medical supplies ordered?: NA  Functional Questionnaire: Do you need assistance with bathing/showering or dressing?: No Do you need assistance with meal preparation?: No Do  you need assistance with eating?: No Do you have difficulty maintaining continence: No Do you need assistance with getting out of bed/getting out of a  chair/moving?: No Do you have difficulty managing or taking your medications?: No  Follow up appointments reviewed: PCP Follow-up appointment confirmed?: No MD Provider Line [phone] Given: No Specialist Hospital Follow-up appointment confirmed?: Yes Date of Specialist follow-up appointment?: 01/10/23 Follow-Up Specialty Provider:: Clint Fenton Do you need transportation to your follow-up appointment?: No Do you understand care options if your condition(s) worsen?: Yes-patient verbalized understanding  SDOH Interventions Today    Flowsheet Row Most Recent Value  SDOH Interventions   Food Insecurity Interventions Intervention Not Indicated  Transportation Interventions Intervention Not Indicated      The patient does not need to enroll in the 30 day Outreach program but one follow up phone will be made to check on nausea due to medications.   Deidre Ala, RN Medical illustrator [facility] [phone]

## 2023-01-05 ENCOUNTER — Ambulatory Visit: Payer: Medicare Other | Admitting: Physician Assistant

## 2023-01-05 ENCOUNTER — Other Ambulatory Visit: Payer: Medicare Other

## 2023-01-05 ENCOUNTER — Ambulatory Visit: Payer: Medicare Other

## 2023-01-06 ENCOUNTER — Other Ambulatory Visit: Payer: Self-pay

## 2023-01-06 NOTE — Transitions of Care (Post Inpatient/ED Visit) (Signed)
01/06/2023  Name: Cameron Roman MRN: [id number] DOB: [birth year]  RNCM provided follow up call to check on medication amiodarone and his nausea. The patient states he has improved and he has been out of the house the last two days which have made him feel better overall. The patient has a follow up appointment with Dr. Charlean Merl on 01/10/23 for hospital discharge and then an appointment with Dr. Jimmey Ralph to discuss an ablation. The patient is would like to proceed with the ablation if he is a candidate.  The patient has declined the 30 day enrollment.  Deidre Ala, RN Medical illustrator [facility] [phone]

## 2023-01-10 ENCOUNTER — Ambulatory Visit (HOSPITAL_COMMUNITY)
Admit: 2023-01-10 | Discharge: 2023-01-10 | Disposition: A | Discharge status: Home / Self Care | Payer: Medicare Other | Source: Ambulatory Visit | Attending: Physician Assistant | Admitting: Physician Assistant

## 2023-01-10 VITALS — BP 114/60 | HR 52 | Ht 70.0 in | Wt 148.6 lb

## 2023-01-10 DIAGNOSIS — Z85118 Personal history of other malignant neoplasm of bronchus and lung: Secondary | ICD-10-CM | POA: Diagnosis not present

## 2023-01-10 DIAGNOSIS — D6869 Other thrombophilia: Secondary | ICD-10-CM | POA: Insufficient documentation

## 2023-01-10 DIAGNOSIS — J449 Chronic obstructive pulmonary disease, unspecified: Secondary | ICD-10-CM | POA: Diagnosis not present

## 2023-01-10 DIAGNOSIS — R9431 Abnormal electrocardiogram [ECG] [EKG]: Secondary | ICD-10-CM | POA: Diagnosis not present

## 2023-01-10 DIAGNOSIS — I519 Heart disease, unspecified: Secondary | ICD-10-CM | POA: Insufficient documentation

## 2023-01-10 DIAGNOSIS — Z8673 Personal history of transient ischemic attack (TIA), and cerebral infarction without residual deficits: Secondary | ICD-10-CM | POA: Diagnosis present

## 2023-01-10 DIAGNOSIS — Z7901 Long term (current) use of anticoagulants: Secondary | ICD-10-CM | POA: Diagnosis not present

## 2023-01-10 DIAGNOSIS — I4819 Other persistent atrial fibrillation: Secondary | ICD-10-CM | POA: Diagnosis not present

## 2023-01-10 DIAGNOSIS — I4891 Unspecified atrial fibrillation: Secondary | ICD-10-CM | POA: Diagnosis present

## 2023-01-10 MED ORDER — AMIODARONE HCL 200 MG PO TABS: 200.0000 mg | ORAL_TABLET | Freq: Every day | ORAL | 2 refills | Status: DC

## 2023-01-10 NOTE — Progress Notes (Signed)
Primary Care Physician: [name], PA-C Primary Cardiologist: None Electrophysiologist: Lewayne Bunting, MD  Referring Physician: Dr Ladona Ridgel   Cameron Roman is a 72 y.o. male with a history of non-small cell lung cancer, COPD, CVA, atrial fibrillation who presents for follow up in the [facility].  The patient was initially diagnosed with atrial fibrillation on an ILR which has been placed following a cryptogenic stroke. Patient is on Eliquis for a CHADS2VASC score of 3. He was seen by Dr Ladona Ridgel and found to be persistently in afib, dofetilide admission recommended.   On follow up today, patient reports that overall he feels improved. He is back in SR. He did have some nausea after leaving the hospital but this has resolved. He continued taking 400 mg BID of amiodarone since discharge. No bleeding issues on anticoagulation.    Today, he denies symptoms of palpitations, chest pain, orthopnea, PND, lower extremity edema, dizziness, presyncope, syncope, snoring, daytime somnolence, bleeding, or neurologic sequela. The patient is tolerating medications without difficulties and is otherwise without complaint today.    Atrial Fibrillation Risk Factors:  he does not have symptoms or diagnosis of sleep apnea. he does not have a history of rheumatic fever.   Atrial Fibrillation Management history:  Previous antiarrhythmic drugs: dofetilide (failed), amiodarone  Previous cardioversions: none Previous ablations: none Anticoagulation history: Eliquis  ROS- All systems are reviewed and negative except as per the HPI above.  Past Medical History:  Diagnosis Date   Anxiety    Arthritis    Atrial fibrillation (HCC)    Chronic back pain    buldging disc   Chronic neck pain    ruptured disc   Cough    hx smoking   Dilated cardiomyopathy (HCC) 12/31/2022   TTE 12/27/2022: EF 40-45, global HK, normal RVSF, mildly elevated PASP, RVSP 42.4, mild LAE, moderate MR     Diverticulosis    Esophageal stricture    GERD (gastroesophageal reflux disease)    takes Prilosec daily   H/O hiatal hernia    Hearing loss    Hepatitis-C 2017   treated and cured per patient   Hiatal hernia    History of colon polyps    History of radiation therapy    Left Lung- 01/10/22-02/18/22- Dr. Antony Blackbird   Insomnia    takes Elavil nightly   Lipoma of abdominal wall 02/11/2011   Lung cancer (HCC)    Stroke (HCC)    x 2   Type 2 diabetes mellitus (HCC) 07/22/2020   patient denies DM, per MD note pt is pre-diabetic    Current Outpatient Medications  Medication Sig Dispense Refill   cyanocobalamin (VITAMIN B12) 1000 MCG/ML injection Inject into the muscle every 30 (thirty) days.     diltiazem (CARDIZEM CD) 300 MG 24 hr capsule Take 1 capsule (300 mg total) by mouth daily. 90 capsule 3   ELIQUIS 5 MG TABS tablet TAKE ONE TABLET BY MOUTH TWICE DAILY 60 tablet 5   fluticasone (FLONASE) 50 MCG/ACT nasal spray Place 1 spray into the nose in the morning.     Fluticasone-Umeclidin-Vilant (TRELEGY ELLIPTA) 100-62.5-25 MCG/ACT AEPB Inhale 1 puff into the lungs daily. 60 each 11   gabapentin (NEURONTIN) 300 MG capsule Take 1 capsule (300 mg total) by mouth 2 (two) times daily.     HYDROcodone-acetaminophen (NORCO) 10-325 MG tablet Take 1 tablet by mouth 3 (three) times daily.     Omega-3 Fatty Acids (FISH OIL) 1000 MG CAPS Take  1,000 mg by mouth in the morning and at bedtime.     omeprazole (PRILOSEC) 20 MG capsule Take 1 capsule (20 mg total) by mouth daily. 90 capsule 3   pantoprazole (PROTONIX) 40 MG tablet TAKE ONE (1) TABLET BY MOUTH EVERY DAY (Patient taking differently: Take 40 mg by mouth at bedtime.) 90 tablet 3   pravastatin (PRAVACHOL) 20 MG tablet TAKE ONE (1) TABLET BY MOUTH EVERY DAY 30 tablet 0   tamsulosin (FLOMAX) 0.4 MG CAPS capsule Take 1 capsule (0.4 mg total) by mouth daily. 90 capsule 3   amiodarone (PACERONE) 200 MG tablet Take 1 tablet (200 mg total) by  mouth daily. 90 tablet 2   No current facility-administered medications for this encounter.    Physical Exam: BP 114/60   Pulse (!) 52   Ht 5\' 10"  (1.778 m)   Wt 67.4 kg   BMI 21.32 kg/m   GEN: Well nourished, well developed in no acute distress NECK: No JVD; No carotid bruits CARDIAC: Regular rate and rhythm, no murmurs, rubs, gallops RESPIRATORY:  Clear to auscultation without rales, wheezing or rhonchi  ABDOMEN: Soft, non-tender, non-distended EXTREMITIES:  No edema; No deformity    Wt Readings from Last 3 Encounters:  01/10/23 67.4 kg  12/26/22 67.5 kg  12/26/22 67.2 kg     EKG today demonstrates  SB, prolonged QT Vent. rate 52 BPM PR interval 194 ms QRS duration 96 ms QT/QTcB 560/520 ms   Echo 12/27/22 demonstrated   1. Left ventricular ejection fraction, by estimation, is 40 to 45%. The  left ventricle has mildly decreased function. The left ventricle  demonstrates global hypokinesis. Left ventricular diastolic parameters are  indeterminate. Elevated left atrial pressure.   2. Right ventricular systolic function is normal. The right ventricular  size is normal. There is mildly elevated pulmonary artery systolic  pressure. The estimated right ventricular systolic pressure is 42.4 mmHg.   3. Left atrial size was mildly dilated.   4. The mitral valve is normal in structure. At least moderate mitral  valve regurgitation, moderate to severe in some R-R intervals with TVI  ratio 1.36. Regurgitation appears worst in the three chamber view.   5. The aortic valve is tricuspid. There is mild thickening of the aortic  valve. Aortic valve regurgitation is not visualized. No aortic stenosis is  present.   Comparison(s): LVEF worse from historic reporting.    CHA2DS2-VASc Score = 4  The patient's score is based upon: CHF History: 1 HTN History: 0 Diabetes History: 0 Stroke History: 2 Vascular Disease History: 0 Age Score: 1 Gender Score: 0          ASSESSMENT AND PLAN: Persistent Atrial Fibrillation (ICD10:  I48.19) The patient's CHA2DS2-VASc score is 4, indicating a 4.8% annual risk of stroke.   Failed dofetilide, loaded on amiodarone He is back in SR, feeling much better Decrease amiodarone to 200 mg once daily He has a visit with Dr Jimmey Ralph to discuss possible ablation.  Continue Eliquis 5 mg BID Continue diltiazem 300 mg daily  Secondary Hypercoagulable State (ICD10:  D68.69) The patient is at significant risk for stroke/thromboembolism based upon his CHA2DS2-VASc Score of 4.  Continue Apixaban (Eliquis).   Lung Cancer Stage IIIB non-small cell lung cancer Followed by Dr Arbutus Ped  Systolic dysfunction  EF 40-45% Suspected tachycardia mediated Fluid status appears stable    Follow up with Dr Jimmey Ralph as scheduled.       Jorja Loa PA-C Af[facility] [address]  [address] [place], [place] [address] [phone]

## 2023-01-10 NOTE — Patient Instructions (Addendum)
Decrease amiodarone to 200mg once a day 

## 2023-01-12 IMAGING — US US ABDOMEN LIMITED
1 series · 14 of 25 positions shown · non-contrast
Comparison: 03/10/2020 and earlier

CLINICAL DATA: HCC screening

EXAM:
ULTRASOUND ABDOMEN LIMITED RIGHT UPPER QUADRANT

[Series 1: us abdomen limited · 0.12mm/px · 14 of 51 slices shown]
[im 1/51]
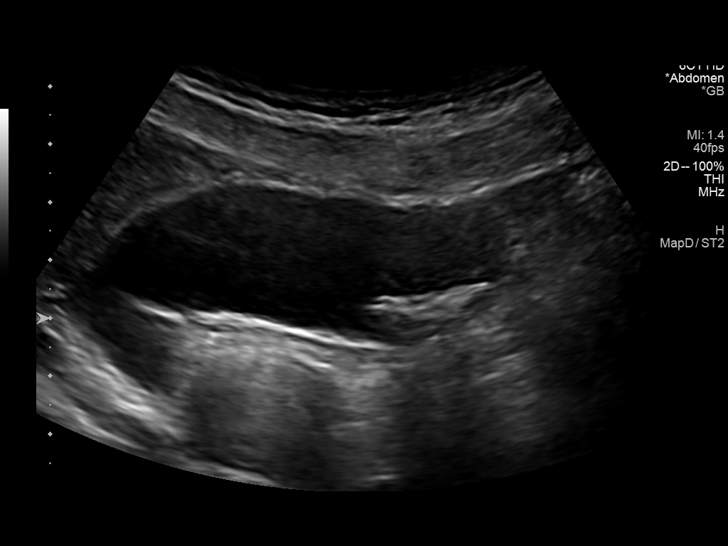
[im 5/51]
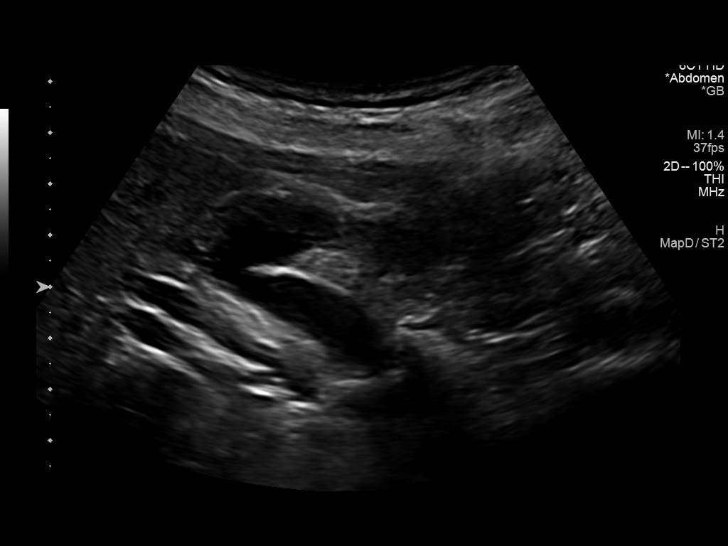
[im 9/51]
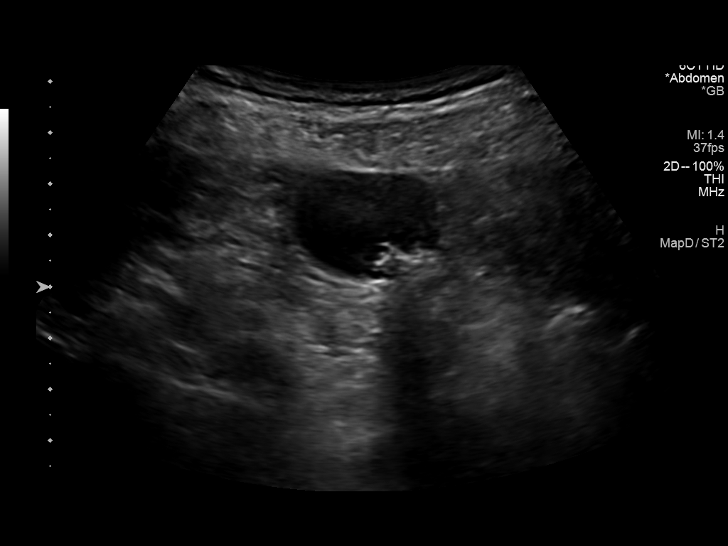
[im 13/51]
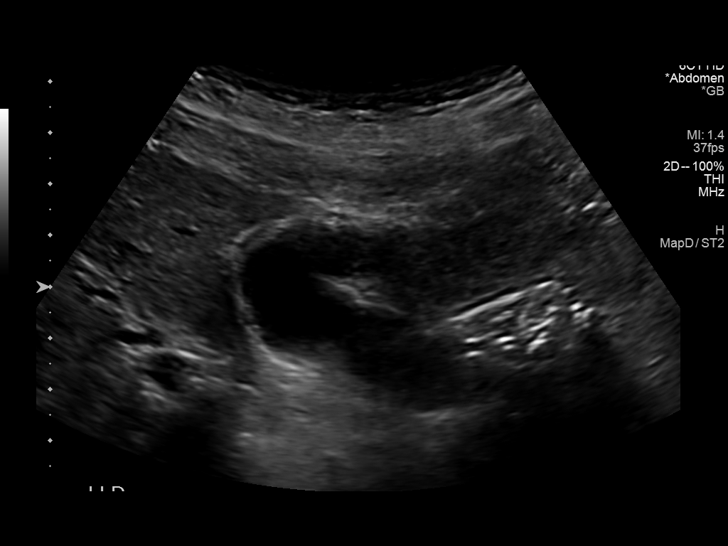
[im 17/51]
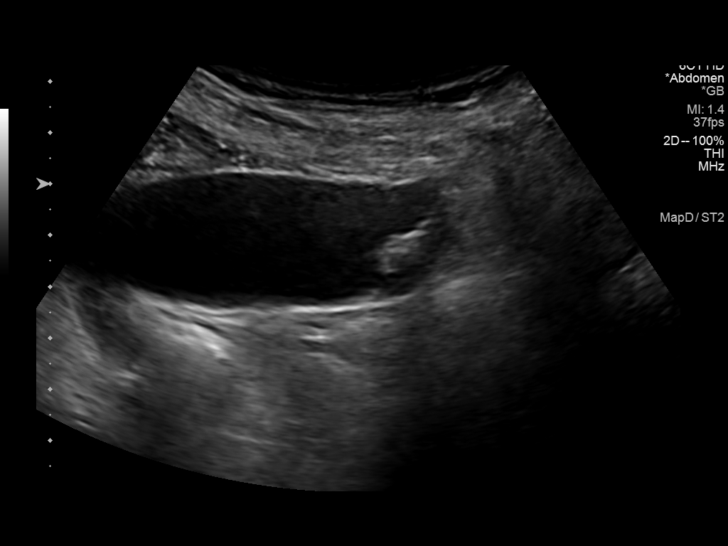
[im 19/51]
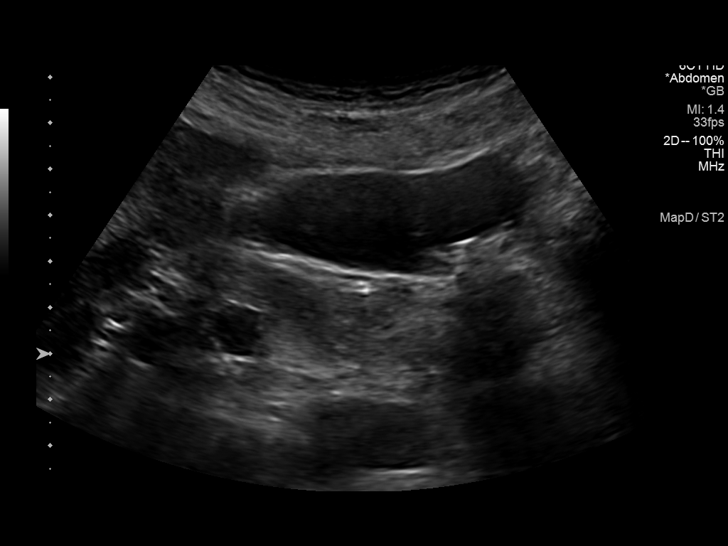
[im 23/51]
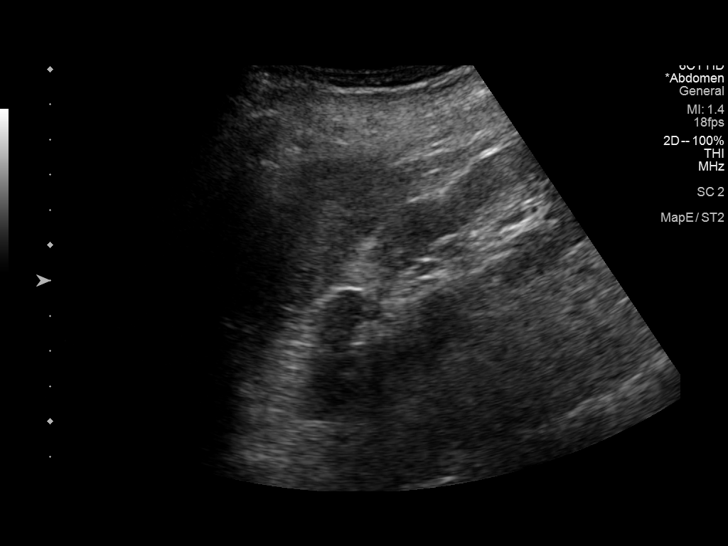
[im 28/51]
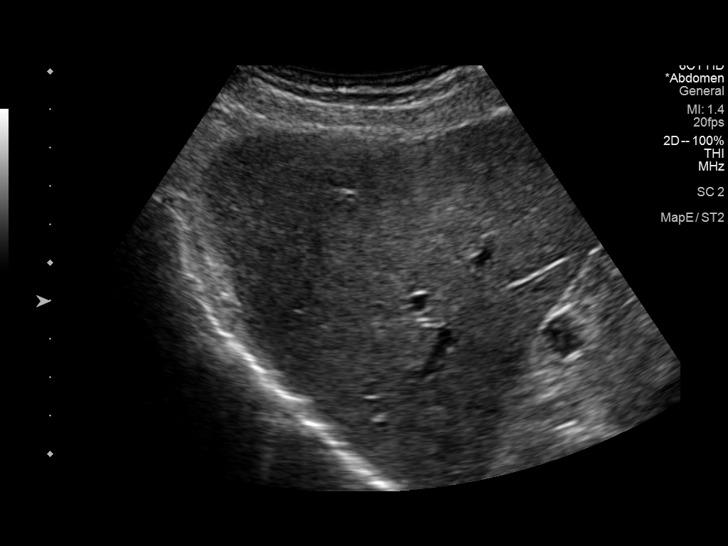
[im 32/51]
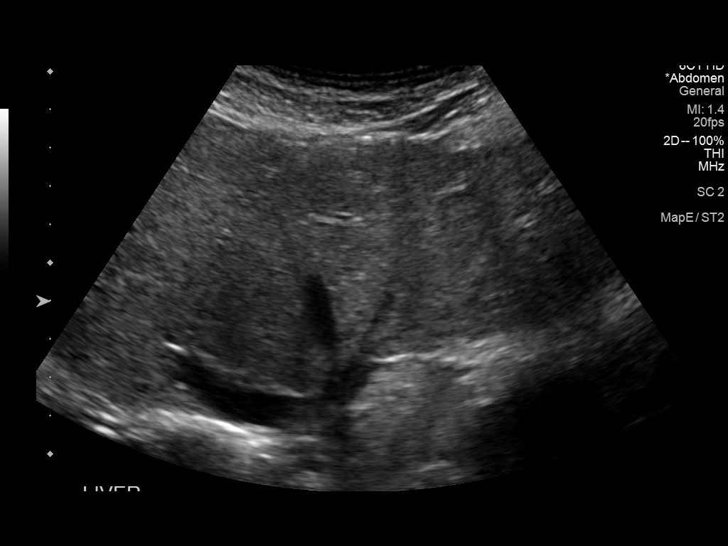
[im 34/51]
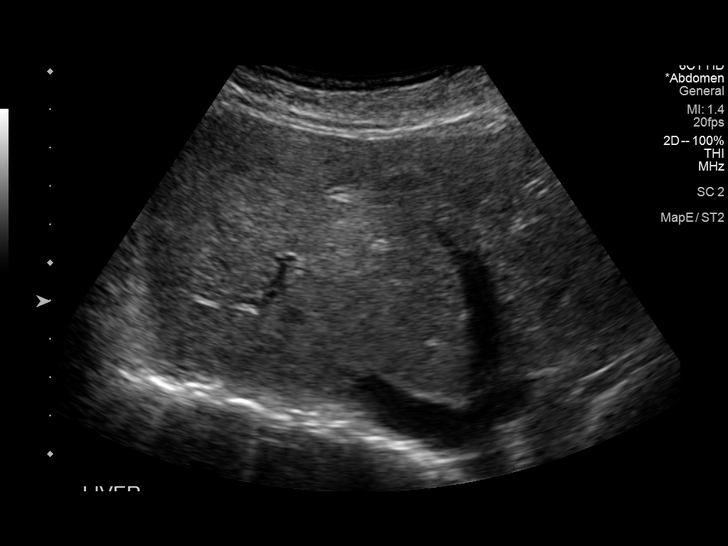
[im 38/51]
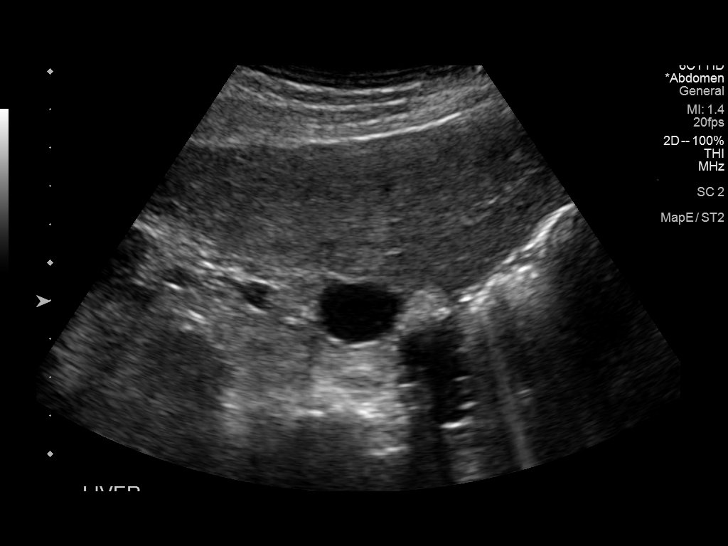
[im 42/51]
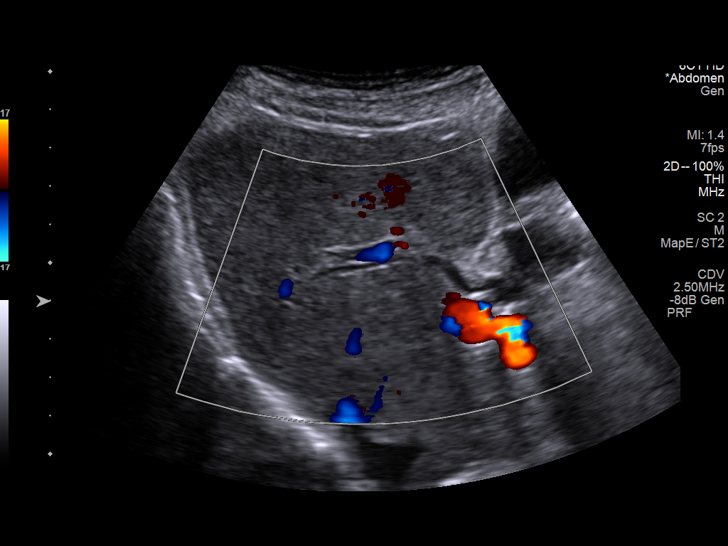
[im 46/51]
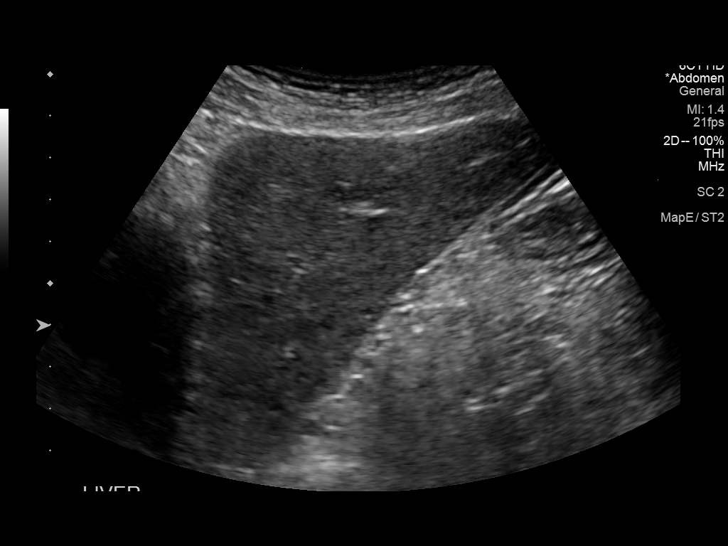
[im 51/51]
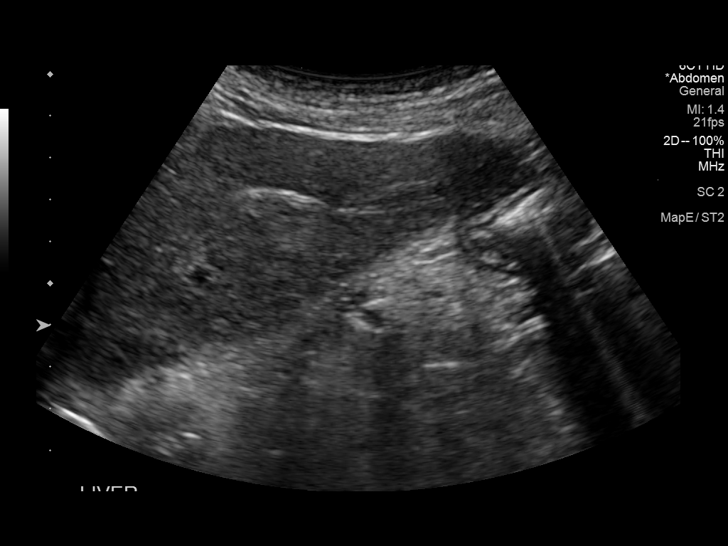

[14 of 25 positions shown; findings below may reference images not displayed]

FINDINGS: Gallbladder:

Shadowing gallstones measuring up to 13 mm. No evidence of acute
cholecystitis

Common bile duct:

Diameter: 4 mm. Prominent intrahepatic ducts that is similar to
prior scans.

Liver:

No focal lesion identified. Within normal limits in parenchymal
echogenicity. Portal vein is patent on color Doppler imaging with
normal direction of blood flow towards the liver.
IMPRESSION: 1. Negative for liver mass or ascites.
2. Cholelithiasis.

## 2023-01-19 ENCOUNTER — Other Ambulatory Visit: Payer: Medicare Other

## 2023-01-19 ENCOUNTER — Ambulatory Visit: Payer: Medicare Other | Admitting: Internal Medicine

## 2023-01-19 ENCOUNTER — Ambulatory Visit: Payer: Medicare Other

## 2023-01-21 ENCOUNTER — Other Ambulatory Visit: Payer: Self-pay | Admitting: Family Medicine

## 2023-01-21 DIAGNOSIS — E782 Mixed hyperlipidemia: Secondary | ICD-10-CM

## 2023-01-23 NOTE — Progress Notes (Unsigned)
Electrophysiology Office Note:   Date:  01/24/2023  ID:  Cameron Roman, DOB Aug 11, 1950, MRN [id number]  Primary Cardiologist: None Electrophysiologist: Lewayne Bunting, MD      History of Present Illness:   Cameron Roman is a 72 y.o. male with h/o squamous cell carcinoma of the lung, COPD, stroke, diabetes and atrial fibrillation seen today for evaluation of atrial fibrillation at the request of Dr. Ladona Ridgel.  Patient had a cryptogenic stroke and underwent ILR implantation, which ultimately identified atrial fibrillation.  Initially his atrial fibrillation was paroxysmal, but he was seen by Dr. Ladona Ridgel in 12/22/2022 with persistent atrial fibrillation with a rapid rates.  Dofetilide load was then recommended.  He was then admitted on 12/26/2022 for dofetilide loading.  He was started on 250 mcg twice daily.  Unfortunately he had recurrence of atrial fibrillation after cardioversion while on dofetilide 125 mcg twice daily in the hospital.  He is very symptomatic during his episodes of atrial fibrillation.  He states that he has very uncomfortable palpitations as well as shortness of breath and fatigue.  He was considered to have failed Tikosyn and was transition to amiodarone.  He was discharged with plans to follow-up in atrial fibrillation clinic on 12/31/2022.He visited with atrial fibrillation clinic on 01/10/2023 and was found to be in normal sinus rhythm at that time.  He has had no known recurrences of atrial fibrillation since that visit.  But does endorse worsening fatigue since being on amiodarone and diltiazem.  He has still been taking amiodarone twice daily.    Review of systems complete and found to be negative unless listed in HPI.   EP Information / Studies Reviewed:    EKG is ordered today. Personal review as below.  EKG Interpretation Date/Time:  Tuesday January 24 2023 08:44:25 EST Ventricular Rate:  67 PR Interval:  180 QRS Duration:  98 QT Interval:  440 QTC Calculation: 464 R  Axis:   86  Text Interpretation: Normal sinus rhythm When compared with ECG of 10-Jan-2023 15:09, QT has shortened Confirmed by Nobie Putnam [phone]) on 01/24/2023 8:57:31 AM EKG 12/29/22:     Echo 12/27/2022: Mildly decreased LV systolic function.  LVEF is 40 to 45%.  There is global hypokinesis. Normal RV size and function.  RVSP 42 mmHg. Mildly dilated left atrium. Moderate to severe mitral regurgitation.   Risk Assessment/Calculations:    CHA2DS2-VASc Score = 4   This indicates a 4.8% annual risk of stroke. The patient's score is based upon: CHF History: 1 HTN History: 0 Diabetes History: 0 Stroke History: 2 Vascular Disease History: 0 Age Score: 1 Gender Score: 0             Physical Exam:   VS:  BP 104/62   Pulse 67   Ht 5\' 10"  (1.778 m)   Wt 149 lb (67.6 kg)   SpO2 90%   BMI 21.38 kg/m    Wt Readings from Last 3 Encounters:  01/24/23 149 lb (67.6 kg)  01/10/23 148 lb 9.6 oz (67.4 kg)  12/26/22 148 lb 12.8 oz (67.5 kg)     GEN: Well nourished, well developed in no acute distress NECK: No JVD; No carotid bruits CARDIAC: Normal rate and regular rhythm. RESPIRATORY:  Clear to auscultation without rales, wheezing or rhonchi  ABDOMEN: Soft, non-tender, non-distended EXTREMITIES:  No edema; No deformity   ASSESSMENT AND PLAN:   Cameron Roman is a 72 y.o. male with h/o squamous cell carcinoma of the lung, COPD, stroke, diabetes and atrial fibrillation  seen today for evaluation of atrial fibrillation at the request of Dr. Ladona Ridgel.  #Persistent Atrial Fibrillation: Symptomatic.  Failed Tikosyn. Continue amiodarone 200 mg once daily Continue Eliquis 5 mg BID Stop diltiazem given reduced LVEF and start metoprolol 25 XL once daily at night. Repeat echocardiogram.   #Secondary Hypercoagulable State: The patient's CHA2DS2-VASc score is 4, indicating a 4.8% annual risk of stroke.   Continue Eliquis.   #Lung Cancer Stage IIIB non-small cell lung cancer Followed  by Dr. Arbutus Ped. I have reached out to Dr. Arbutus Ped to discuss his overall prognosis as well as any plans for future procedure or biopsies as he will need uninterrupted oral anti-coagulation for ablation (1 month pre and 3 months post).   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 Last EF 40-45%. Well compensated. Start metoprolol XL 25mg  once daily. Stop diltiazem given h/o reduced EF.  Repeat echocardiogram after having been in normal sinus rhythm for 3 to 4 weeks.   Signed, Nobie Putnam, MD

## 2023-01-24 ENCOUNTER — Encounter: Payer: Self-pay | Admitting: Cardiology

## 2023-01-24 ENCOUNTER — Ambulatory Visit: Payer: Medicare Other | Attending: Cardiology | Admitting: Cardiology

## 2023-01-24 ENCOUNTER — Other Ambulatory Visit: Payer: Self-pay

## 2023-01-24 VITALS — BP 104/62 | HR 67 | Ht 70.0 in | Wt 149.0 lb

## 2023-01-24 DIAGNOSIS — I5022 Chronic systolic (congestive) heart failure: Secondary | ICD-10-CM | POA: Diagnosis not present

## 2023-01-24 DIAGNOSIS — I4819 Other persistent atrial fibrillation: Secondary | ICD-10-CM

## 2023-01-24 DIAGNOSIS — D6869 Other thrombophilia: Secondary | ICD-10-CM | POA: Diagnosis not present

## 2023-01-24 DIAGNOSIS — I639 Cerebral infarction, unspecified: Secondary | ICD-10-CM | POA: Diagnosis not present

## 2023-01-24 MED ORDER — METOPROLOL SUCCINATE ER 25 MG PO TB24: 25.0000 mg | ORAL_TABLET | Freq: Every evening | ORAL | 3 refills | Status: DC

## 2023-01-24 NOTE — Patient Instructions (Signed)
Medication Instructions:  Your physician has recommended you make the following change in your medication: 1) STOP taking diltiazem  2) DECREASE amiodarone to 200 mg once daily  3) START taking Toprol XL (metoprolol succinate) 25 mg once every evening  *If you need a refill on your cardiac medications before your next appointment, please call your pharmacy*   Lab Work: BMET and CBC prior to CT scan and ablation  Testing/Procedures: IN 2-3 WEEKS: Your physician has requested that you have an echocardiogram. Echocardiography is a painless test that uses sound waves to create images of your heart. It provides your doctor with information about the size and shape of your heart and how well your heart's chambers and valves are working. This procedure takes approximately one hour. There are no restrictions for this procedure. Please do NOT wear cologne, perfume, aftershave, or lotions (deodorant is allowed). Please arrive 15 minutes prior to your appointment time.  Please note: We ask at that you not bring children with you during ultrasound (echo/ vascular) testing. Due to room size and safety concerns, children are not allowed in the ultrasound rooms during exams. Our front office staff cannot provide observation of children in our lobby area while testing is being conducted. An adult accompanying a patient to their appointment will only be allowed in the ultrasound room at the discretion of the ultrasound technician under special circumstances. We apologize for any inconvenience.  Your physician has requested that you have cardiac CT. Cardiac computed tomography (CT) is a painless test that uses an x-ray machine to take clear, detailed pictures of your heart. For further information please visit [email or web address] We will call you to schedule your CT scan. It will be done about 3 weeks prior to your ablation.  Your physician has recommended that you have an ablation. Catheter ablation is a  medical procedure used to treat some cardiac arrhythmias (irregular heartbeats). During catheter ablation, a long, thin, flexible tube is put into a blood vessel in your groin (upper thigh), or neck. This tube is called an ablation catheter. It is then guided to your heart through the blood vessel. Radio frequency waves destroy small areas of heart tissue where abnormal heartbeats may cause an arrhythmia to start. Please see the instruction sheet given to you today.  Follow-Up: At [facility], you and your health needs are our priority.  As part of our continuing mission to provide you with exceptional heart care, we have created designated Provider Care Teams.  These Care Teams include your primary Cardiologist (physician) and Advanced Practice Providers (APPs -  Physician Assistants and Nurse Practitioners) who all work together to provide you with the care you need, when you need it.  Your next appointment:   We will call you to schedule your follow up appointments

## 2023-01-25 LAB — CBC
Hematocrit: 36.1 % — ABNORMAL LOW (ref 37.5–51.0)
Hemoglobin: 11.9 g/dL — ABNORMAL LOW (ref 13.0–17.7)
MCH: 31.1 pg (ref 26.6–33.0)
MCHC: 33 g/dL (ref 31.5–35.7)
MCV: 94 fL (ref 79–97)
Platelets: 412 10*3/uL (ref 150–450)
RBC: 3.83 x10E6/uL — ABNORMAL LOW (ref 4.14–5.80)
RDW: 15 % (ref 11.6–15.4)
WBC: 10.6 10*3/uL (ref 3.4–10.8)

## 2023-01-25 LAB — BASIC METABOLIC PANEL
BUN/Creatinine Ratio: 21 (ref 10–24)
BUN: 18 mg/dL (ref 8–27)
CO2: 26 mmol/L (ref 20–29)
Calcium: 9.6 mg/dL (ref 8.6–10.2)
Chloride: 99 mmol/L (ref 96–106)
Creatinine, Ser: 0.87 mg/dL (ref 0.76–1.27)
Glucose: 93 mg/dL (ref 70–99)
Potassium: 4.5 mmol/L (ref 3.5–5.2)
Sodium: 136 mmol/L (ref 134–144)
eGFR: 92 mL/min/{1.73_m2} (ref >59)

## 2023-02-02 ENCOUNTER — Other Ambulatory Visit: Payer: Medicare Other

## 2023-02-02 ENCOUNTER — Ambulatory Visit: Payer: Medicare Other | Admitting: Internal Medicine

## 2023-02-02 ENCOUNTER — Ambulatory Visit: Payer: Medicare Other

## 2023-02-06 ENCOUNTER — Ambulatory Visit (HOSPITAL_COMMUNITY)
Admission: RE | Admit: 2023-02-06 | Discharge: 2023-02-06 | Disposition: A | Discharge status: Home / Self Care | Payer: Medicare Other | Source: Ambulatory Visit | Attending: Cardiology | Admitting: Cardiology

## 2023-02-06 DIAGNOSIS — I4819 Other persistent atrial fibrillation: Secondary | ICD-10-CM | POA: Diagnosis not present

## 2023-02-06 MED ORDER — IOHEXOL 350 MG/ML SOLN
95.0000 mL | Freq: Once | INTRAVENOUS | Status: AC | PRN
  Administered 2023-02-06: 95 mL via INTRAVENOUS

## 2023-02-13 ENCOUNTER — Ambulatory Visit (HOSPITAL_COMMUNITY)
Admission: RE | Admit: 2023-02-13 | Discharge: 2023-02-13 | Disposition: A | Discharge status: Home / Self Care | Payer: Medicare Other | Source: Ambulatory Visit | Attending: Cardiology | Admitting: Cardiology

## 2023-02-13 ENCOUNTER — Other Ambulatory Visit: Payer: Self-pay | Admitting: Nurse Practitioner

## 2023-02-13 DIAGNOSIS — I5022 Chronic systolic (congestive) heart failure: Secondary | ICD-10-CM

## 2023-02-13 DIAGNOSIS — K760 Fatty (change of) liver, not elsewhere classified: Secondary | ICD-10-CM

## 2023-02-13 DIAGNOSIS — I4819 Other persistent atrial fibrillation: Secondary | ICD-10-CM | POA: Insufficient documentation

## 2023-02-13 DIAGNOSIS — D376 Neoplasm of uncertain behavior of liver, gallbladder and bile ducts: Secondary | ICD-10-CM

## 2023-02-13 DIAGNOSIS — K7469 Other cirrhosis of liver: Secondary | ICD-10-CM

## 2023-02-13 LAB — ECHOCARDIOGRAM COMPLETE
Area-P 1/2: 2.66 cm2
Calc EF: 59.1 %
S' Lateral: 3.3 cm
Single Plane A2C EF: 55.1 %
Single Plane A4C EF: 59.5 %

## 2023-02-13 NOTE — Progress Notes (Signed)
*  PRELIMINARY RESULTS* Echocardiogram 2D Echocardiogram has been performed.  Cameron Roman 02/13/2023, 9:27 AM

## 2023-02-15 NOTE — Pre-Procedure Instructions (Signed)
Instructed patient on the following items: Arrival time 0800 Nothing to eat or drink after midnight No meds AM of procedure Responsible person to drive you home and stay with you for 24 hrs  Have you missed any doses of anti-coagulant Eliquis- takes twice a day, hasn't missed any doses.  Don't take dose on Friday morning.

## 2023-02-17 ENCOUNTER — Ambulatory Visit (HOSPITAL_BASED_OUTPATIENT_CLINIC_OR_DEPARTMENT_OTHER): Payer: Medicare Other | Admitting: Anesthesiology

## 2023-02-17 ENCOUNTER — Ambulatory Visit (HOSPITAL_COMMUNITY)
Admission: RE | Disposition: A | Discharge status: Home / Self Care | Payer: Self-pay | Source: Home / Self Care | Attending: Cardiology

## 2023-02-17 ENCOUNTER — Ambulatory Visit (HOSPITAL_COMMUNITY)
Admission: RE | Admit: 2023-02-17 | Discharge: 2023-02-17 | Disposition: A | Discharge status: Home / Self Care | Payer: Medicare Other | Attending: Cardiology | Admitting: Cardiology

## 2023-02-17 ENCOUNTER — Ambulatory Visit (HOSPITAL_COMMUNITY): Payer: Medicare Other | Admitting: Anesthesiology

## 2023-02-17 ENCOUNTER — Other Ambulatory Visit: Payer: Self-pay

## 2023-02-17 DIAGNOSIS — C3490 Malignant neoplasm of unspecified part of unspecified bronchus or lung: Secondary | ICD-10-CM | POA: Diagnosis not present

## 2023-02-17 DIAGNOSIS — I110 Hypertensive heart disease with heart failure: Secondary | ICD-10-CM | POA: Diagnosis not present

## 2023-02-17 DIAGNOSIS — D6869 Other thrombophilia: Secondary | ICD-10-CM | POA: Diagnosis not present

## 2023-02-17 DIAGNOSIS — Z87891 Personal history of nicotine dependence: Secondary | ICD-10-CM | POA: Diagnosis not present

## 2023-02-17 DIAGNOSIS — Z7901 Long term (current) use of anticoagulants: Secondary | ICD-10-CM | POA: Insufficient documentation

## 2023-02-17 DIAGNOSIS — I4819 Other persistent atrial fibrillation: Secondary | ICD-10-CM | POA: Insufficient documentation

## 2023-02-17 DIAGNOSIS — Z8673 Personal history of transient ischemic attack (TIA), and cerebral infarction without residual deficits: Secondary | ICD-10-CM | POA: Insufficient documentation

## 2023-02-17 DIAGNOSIS — I5022 Chronic systolic (congestive) heart failure: Secondary | ICD-10-CM | POA: Diagnosis not present

## 2023-02-17 DIAGNOSIS — E119 Type 2 diabetes mellitus without complications: Secondary | ICD-10-CM | POA: Diagnosis not present

## 2023-02-17 HISTORY — PX: ATRIAL FIBRILLATION ABLATION: EP1191

## 2023-02-17 LAB — GLUCOSE, CAPILLARY: Glucose-Capillary: 137 mg/dL — ABNORMAL HIGH (ref 70–99)

## 2023-02-17 SURGERY — ATRIAL FIBRILLATION ABLATION
Anesthesia: General

## 2023-02-17 MED ORDER — EPHEDRINE SULFATE-NACL 50-0.9 MG/10ML-% IV SOSY
PREFILLED_SYRINGE | INTRAVENOUS | Status: DC | PRN
  Administered 2023-02-17: 15 mg via INTRAVENOUS
  Administered 2023-02-17: 10 mg via INTRAVENOUS

## 2023-02-17 MED ORDER — SODIUM CHLORIDE 0.9% FLUSH: 3.0000 mL | Freq: Two times a day (BID) | INTRAVENOUS | Status: DC

## 2023-02-17 MED ORDER — HEPARIN SODIUM (PORCINE) 1000 UNIT/ML IJ SOLN
INTRAMUSCULAR | Status: DC | PRN
  Administered 2023-02-17: 11000 [IU] via INTRAVENOUS
  Administered 2023-02-17: 4000 [IU] via INTRAVENOUS

## 2023-02-17 MED ORDER — ONDANSETRON HCL 4 MG/2ML IJ SOLN: 4.0000 mg | Freq: Four times a day (QID) | INTRAMUSCULAR | Status: DC | PRN

## 2023-02-17 MED ORDER — FENTANYL CITRATE (PF) 100 MCG/2ML IJ SOLN
INTRAMUSCULAR | Status: AC
  Filled 2023-02-17: qty 2

## 2023-02-17 MED ORDER — LIDOCAINE 2% (20 MG/ML) 5 ML SYRINGE
INTRAMUSCULAR | Status: DC | PRN
  Administered 2023-02-17: 60 mg via INTRAVENOUS

## 2023-02-17 MED ORDER — APIXABAN 5 MG PO TABS
5.0000 mg | ORAL_TABLET | Freq: Two times a day (BID) | ORAL | Status: DC
  Administered 2023-02-17: 5 mg via ORAL
  Filled 2023-02-17: qty 1

## 2023-02-17 MED ORDER — SODIUM CHLORIDE 0.9 % IV SOLN: INTRAVENOUS | Status: DC

## 2023-02-17 MED ORDER — PROTAMINE SULFATE 10 MG/ML IV SOLN
INTRAVENOUS | Status: DC | PRN
  Administered 2023-02-17: 35 mg via INTRAVENOUS

## 2023-02-17 MED ORDER — HEPARIN (PORCINE) IN NACL 1000-0.9 UT/500ML-% IV SOLN
INTRAVENOUS | Status: DC | PRN
  Administered 2023-02-17 (×3): 500 mL

## 2023-02-17 MED ORDER — ACETAMINOPHEN 500 MG PO TABS
1000.0000 mg | ORAL_TABLET | Freq: Once | ORAL | Status: AC
  Administered 2023-02-17: 1000 mg via ORAL
  Filled 2023-02-17: qty 2

## 2023-02-17 MED ORDER — CEFAZOLIN SODIUM-DEXTROSE 2-4 GM/100ML-% IV SOLN
INTRAVENOUS | Status: AC
  Filled 2023-02-17: qty 100

## 2023-02-17 MED ORDER — SODIUM CHLORIDE 0.9 % IV SOLN: 250.0000 mL | INTRAVENOUS | Status: DC | PRN

## 2023-02-17 MED ORDER — ATROPINE SULFATE 1 MG/ML IV SOLN
INTRAVENOUS | Status: DC | PRN
  Administered 2023-02-17: 1 mg via INTRAVENOUS

## 2023-02-17 MED ORDER — CEFAZOLIN SODIUM-DEXTROSE 2-3 GM-%(50ML) IV SOLR
INTRAVENOUS | Status: DC | PRN
  Administered 2023-02-17: 2 g via INTRAVENOUS

## 2023-02-17 MED ORDER — HYDROCODONE-ACETAMINOPHEN 10-325 MG PO TABS
1.0000 | ORAL_TABLET | Freq: Three times a day (TID) | ORAL | Status: DC
  Administered 2023-02-17: 1 via ORAL
  Filled 2023-02-17: qty 1

## 2023-02-17 MED ORDER — DEXAMETHASONE SODIUM PHOSPHATE 10 MG/ML IJ SOLN
INTRAMUSCULAR | Status: DC | PRN
  Administered 2023-02-17: 5 mg via INTRAVENOUS

## 2023-02-17 MED ORDER — SUGAMMADEX SODIUM 200 MG/2ML IV SOLN
INTRAVENOUS | Status: DC | PRN
  Administered 2023-02-17: 200 mg via INTRAVENOUS

## 2023-02-17 MED ORDER — PROPOFOL 10 MG/ML IV BOLUS
INTRAVENOUS | Status: DC | PRN
  Administered 2023-02-17: 120 mg via INTRAVENOUS

## 2023-02-17 MED ORDER — ONDANSETRON HCL 4 MG/2ML IJ SOLN
INTRAMUSCULAR | Status: DC | PRN
  Administered 2023-02-17: 4 mg via INTRAVENOUS

## 2023-02-17 MED ORDER — ATROPINE SULFATE 1 MG/10ML IJ SOSY
PREFILLED_SYRINGE | INTRAMUSCULAR | Status: AC
  Filled 2023-02-17: qty 10

## 2023-02-17 MED ORDER — PHENYLEPHRINE HCL-NACL 20-0.9 MG/250ML-% IV SOLN
INTRAVENOUS | Status: DC | PRN
  Administered 2023-02-17: 30 ug/min via INTRAVENOUS

## 2023-02-17 MED ORDER — PHENYLEPHRINE 80 MCG/ML (10ML) SYRINGE FOR IV PUSH (FOR BLOOD PRESSURE SUPPORT)
PREFILLED_SYRINGE | INTRAVENOUS | Status: DC | PRN
  Administered 2023-02-17: 80 ug via INTRAVENOUS

## 2023-02-17 MED ORDER — HEPARIN SODIUM (PORCINE) 1000 UNIT/ML IJ SOLN
INTRAMUSCULAR | Status: AC
  Filled 2023-02-17: qty 10

## 2023-02-17 MED ORDER — SODIUM CHLORIDE 0.9% FLUSH: 3.0000 mL | INTRAVENOUS | Status: DC | PRN

## 2023-02-17 MED ORDER — FENTANYL CITRATE (PF) 250 MCG/5ML IJ SOLN
INTRAMUSCULAR | Status: DC | PRN
  Administered 2023-02-17: 100 ug via INTRAVENOUS

## 2023-02-17 MED ORDER — ROCURONIUM BROMIDE 10 MG/ML (PF) SYRINGE
PREFILLED_SYRINGE | INTRAVENOUS | Status: DC | PRN
  Administered 2023-02-17: 50 mg via INTRAVENOUS

## 2023-02-17 SURGICAL SUPPLY — 20 items
BAG SNAP BAND KOVER 36X36 (MISCELLANEOUS) IMPLANT
CABLE PFA RX CATH CONN (CABLE) IMPLANT
CATH FARAWAVE ABLATION 31 (CATHETERS) IMPLANT
CATH OCTARAY 2.0 F 3-3-3-3-3 (CATHETERS) IMPLANT
CATH SOUNDSTAR ECO 8FR (CATHETERS) IMPLANT
CATH WEBSTER BI DIR CS D-F CRV (CATHETERS) IMPLANT
CLOSURE PERCLOSE PROSTYLE (VASCULAR PRODUCTS) IMPLANT
COVER SWIFTLINK CONNECTOR (BAG) ×1 IMPLANT
DEVICE CLOSURE MYNXGRIP 6/7F (Vascular Products) IMPLANT
DILATOR VESSEL 38 20CM 16FR (INTRODUCER) IMPLANT
GUIDEWIRE INQWIRE 1.5J.035X260 (WIRE) IMPLANT
INQWIRE 1.5J .035X260CM (WIRE) ×1
KIT VERSACROSS CNCT FARADRIVE (KITS) IMPLANT
PACK EP LF (CUSTOM PROCEDURE TRAY) ×1 IMPLANT
PAD DEFIB RADIO PHYSIO CONN (PAD) ×1 IMPLANT
PATCH CARTO3 (PAD) IMPLANT
SHEATH FARADRIVE STEERABLE (SHEATH) IMPLANT
SHEATH PINNACLE 8F 10CM (SHEATH) IMPLANT
SHEATH PINNACLE 9F 10CM (SHEATH) IMPLANT
SHEATH PROBE COVER 6X72 (BAG) IMPLANT

## 2023-02-17 NOTE — Discharge Instructions (Addendum)

## 2023-02-17 NOTE — Transfer of Care (Signed)
Immediate Anesthesia Transfer of Care Note  Patient: Cameron Roman  Procedure(s) Performed: ATRIAL FIBRILLATION ABLATION  Patient Location: PACU and Cath Lab  Anesthesia Type:General  Level of Consciousness: drowsy and patient cooperative  Airway & Oxygen Therapy: Patient Spontanous Breathing  Post-op Assessment: Report given to RN and Post -op Vital signs reviewed and stable  Post vital signs: Reviewed and stable  Last Vitals:  Vitals Value Taken Time  BP    Temp    Pulse    Resp    SpO2      Last Pain:  Vitals:   02/17/23 0831  TempSrc: Oral  PainSc:          Complications: There were no known notable events for this encounter.

## 2023-02-17 NOTE — Anesthesia Procedure Notes (Signed)
Procedure Name: Intubation Date/Time: 02/17/2023 11:13 AM  Performed by: Orlin Hilding, CRNAPre-anesthesia Checklist: Patient identified, Emergency Drugs available, Suction available, Patient being monitored and Timeout performed Patient Re-evaluated:Patient Re-evaluated prior to induction Oxygen Delivery Method: Circle system utilized Preoxygenation: Pre-oxygenation with 100% oxygen Induction Type: IV induction Ventilation: Mask ventilation without difficulty and Oral airway inserted - appropriate to patient size Laryngoscope Size: Mac and 4 Grade View: Grade I Tube type: Oral Tube size: 7.5 mm Number of attempts: 1 Placement Confirmation: ETT inserted through vocal cords under direct vision, positive ETCO2 and breath sounds checked- equal and bilateral Secured at: 23 cm Tube secured with: Tape Dental Injury: Teeth and Oropharynx as per pre-operative assessment

## 2023-02-17 NOTE — Anesthesia Postprocedure Evaluation (Signed)
Anesthesia Post Note  Patient: Cameron Roman  Procedure(s) Performed: ATRIAL FIBRILLATION ABLATION     Patient location during evaluation: Cath Lab Anesthesia Type: General Level of consciousness: awake and alert Pain management: pain level controlled Vital Signs Assessment: post-procedure vital signs reviewed and stable Respiratory status: spontaneous breathing, nonlabored ventilation and respiratory function stable Cardiovascular status: blood pressure returned to baseline and stable Postop Assessment: no apparent nausea or vomiting Anesthetic complications: no  There were no known notable events for this encounter.  Last Vitals:  Vitals:   02/17/23 1345 02/17/23 1348  BP: (!) 153/84 (!) 154/84  Pulse: 77   Resp: 11   Temp:  (!) 36.1 C  SpO2: 100%     Last Pain:  Vitals:   02/17/23 1348  TempSrc: Temporal  PainSc:                  Oshae Simmering,W. EDMOND

## 2023-02-17 NOTE — Anesthesia Preprocedure Evaluation (Addendum)
Anesthesia Evaluation  Patient identified by MRN, date of birth, ID band Patient awake    Reviewed: Allergy & Precautions, H&P , NPO status , Patient's Chart, lab work & pertinent test results  Airway Mallampati: II  TM Distance: >3 FB Neck ROM: Full    Dental no notable dental hx. (+) Edentulous Upper, Edentulous Lower, Dental Advisory Given   Pulmonary former smoker   Pulmonary exam normal breath sounds clear to auscultation       Cardiovascular hypertension, Pt. on medications and Pt. on home beta blockers + dysrhythmias Atrial Fibrillation  Rhythm:Regular Rate:Normal     Neuro/Psych  Headaches  Anxiety     CVA    GI/Hepatic Neg liver ROS, hiatal hernia,GERD  Medicated,,  Endo/Other  diabetes    Renal/GU negative Renal ROS  negative genitourinary   Musculoskeletal  (+) Arthritis , Osteoarthritis,    Abdominal   Peds  Hematology negative hematology ROS (+)   Anesthesia Other Findings   Reproductive/Obstetrics negative OB ROS                             Anesthesia Physical Anesthesia Plan  ASA: 3  Anesthesia Plan: General   Post-op Pain Management: Tylenol PO (pre-op)*   Induction: Intravenous  PONV Risk Score and Plan: 3 and Ondansetron, Dexamethasone and Treatment may vary due to age or medical condition  Airway Management Planned: Oral ETT  Additional Equipment:   Intra-op Plan:   Post-operative Plan: Extubation in OR  Informed Consent: I have reviewed the patients History and Physical, chart, labs and discussed the procedure including the risks, benefits and alternatives for the proposed anesthesia with the patient or authorized representative who has indicated his/her understanding and acceptance.     Dental advisory given  Plan Discussed with: CRNA  Anesthesia Plan Comments:        Anesthesia Quick Evaluation

## 2023-02-17 NOTE — H&P (Signed)
Electrophysiology Note:   Date:  02/17/2023  ID:  Cameron Roman, DOB 1950/11/20, MRN [id number]   Primary Cardiologist: None Electrophysiologist: Lewayne Bunting, MD       History of Present Illness:   Cameron Roman is a 72 y.o. male with h/o squamous cell carcinoma of the lung, COPD, stroke, diabetes and atrial fibrillation seen today for evaluation of atrial fibrillation at the request of Dr. Ladona Ridgel.   Patient had a cryptogenic stroke and underwent ILR implantation, which ultimately identified atrial fibrillation.  Initially his atrial fibrillation was paroxysmal, but he was seen by Dr. Ladona Ridgel in 12/22/2022 with persistent atrial fibrillation with a rapid rates.  Dofetilide load was then recommended.  He was then admitted on 12/26/2022 for dofetilide loading.  He was started on 250 mcg twice daily.  Unfortunately he had recurrence of atrial fibrillation after cardioversion while on dofetilide 125 mcg twice daily in the hospital.  He is very symptomatic during his episodes of atrial fibrillation.  He states that he has very uncomfortable palpitations as well as shortness of breath and fatigue.  He was considered to have failed Tikosyn and was transition to amiodarone.  He was discharged with plans to follow-up in atrial fibrillation clinic on 12/31/2022.He visited with atrial fibrillation clinic on 01/10/2023 and was found to be in normal sinus rhythm at that time.  He has had no known recurrences of atrial fibrillation since that visit.  But does endorse worsening fatigue since being on amiodarone and diltiazem.  He has still been taking amiodarone twice daily.     INTERVAL HISTORY: Echo done with improvement in LVEF. Doing relatively well. Presents today for ablation. No new or acute complaints.   Review of systems complete and found to be negative unless listed in HPI.    EP Information / Studies Reviewed:    EKG Interpretation Date/Time:                  Tuesday January 24 2023 08:44:25  EST Ventricular Rate:         67 PR Interval:                 180 QRS Duration:             98 QT Interval:                 440 QTC Calculation:464 R Axis:                         86   Text Interpretation:Normal sinus rhythm When compared with ECG of 10-Jan-2023 15:09, QT has shortened Confirmed by Nobie Putnam [phone]) on 01/24/2023 8:57:31 AM EKG 12/29/22:     Echo 12/27/2022: Mildly decreased LV systolic function.  LVEF is 40 to 45%.  There is global hypokinesis. Normal RV size and function.  RVSP 42 mmHg. Mildly dilated left atrium. Moderate to severe mitral regurgitation.     Risk Assessment/Calculations:     CHA2DS2-VASc Score = 4   This indicates a 4.8% annual risk of stroke. The patient's score is based upon: CHF History: 1 HTN History: 0 Diabetes History: 0 Stroke History: 2 Vascular Disease History: 0 Age Score: 1 Gender Score: 0   Physical Exam:    Vitals:   02/17/23 0831  BP: (!) 143/70  Pulse: 62  Resp: 18  Temp: (!) 97.5 F (36.4 C)  SpO2: 97%     GEN: Well nourished, well developed in no acute distress NECK: No  JVD; No carotid bruits CARDIAC: Normal rate and regular rhythm. RESPIRATORY:  Clear to auscultation without rales, wheezing or rhonchi  ABDOMEN: Soft, non-tender, non-distended EXTREMITIES:  No edema; No deformity    ASSESSMENT AND PLAN:   Cameron Roman is a 72 y.o. male with h/o squamous cell carcinoma of the lung, COPD, stroke, diabetes and atrial fibrillation seen today for evaluation of atrial fibrillation at the request of Dr. Ladona Ridgel.   #Persistent Atrial Fibrillation: Symptomatic.  Failed Tikosyn. He is currently on amiodarone but has lung cancer for which he is being treated, would like to avoid long-term. Spoke with his oncologist, Dr. Arbutus Ped, who encouraged proceeding with catheter ablation.  -Discussed treatment options today for AF including antiarrhythmic drug therapy and ablation. Discussed risks, recovery and likelihood of  success with each treatment strategy. Risk, benefits, and alternatives to EP study and ablation for afib were discussed. These risks include but are not limited to stroke, bleeding, vascular damage, tamponade, perforation, damage to the esophagus, lungs, phrenic nerve and other structures, pulmonary vein stenosis, worsening renal function, coronary vasospasm and death.  Discussed potential need for repeat ablation procedures and antiarrhythmic drugs after an initial ablation. The patient understands these risk and wishes to proceed.  We will therefore proceed with catheter ablation today. -Stop amiodarone after the ablation.  -Continue Eliquis.   #Secondary Hypercoagulable State: The patient's CHA2DS2-VASc score is 4, indicating a 4.8% annual risk of stroke.   Continue Eliquis.   #Lung Cancer Stage IIIB non-small cell lung cancer Followed by Dr. Arbutus Ped. I have communicated with Dr. Arbutus Ped on multiple occasions. There are no plans for surgery or procedures in near future.    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 EF recovered in normal rhythm.    Signed, Nobie Putnam, MD

## 2023-02-20 ENCOUNTER — Encounter (HOSPITAL_COMMUNITY): Payer: Self-pay | Admitting: Cardiology

## 2023-02-20 LAB — POCT ACTIVATED CLOTTING TIME
Activated Clotting Time: 268 s
Activated Clotting Time: 325 s

## 2023-02-20 MED FILL — Cefazolin Sodium-Dextrose IV Solution 2 GM/100ML-4%: INTRAVENOUS | Qty: 100 | Status: AC

## 2023-02-23 ENCOUNTER — Other Ambulatory Visit (HOSPITAL_COMMUNITY): Payer: Medicare Other

## 2023-02-27 ENCOUNTER — Inpatient Hospital Stay: Payer: Medicare Other | Attending: Internal Medicine

## 2023-02-27 ENCOUNTER — Ambulatory Visit (HOSPITAL_COMMUNITY)
Admission: RE | Admit: 2023-02-27 | Discharge: 2023-02-27 | Disposition: A | Discharge status: Home / Self Care | Payer: Medicare Other | Source: Ambulatory Visit | Attending: Internal Medicine | Admitting: Internal Medicine

## 2023-02-27 ENCOUNTER — Encounter (HOSPITAL_COMMUNITY): Payer: Self-pay

## 2023-02-27 DIAGNOSIS — K219 Gastro-esophageal reflux disease without esophagitis: Secondary | ICD-10-CM | POA: Insufficient documentation

## 2023-02-27 DIAGNOSIS — R079 Chest pain, unspecified: Secondary | ICD-10-CM | POA: Diagnosis not present

## 2023-02-27 DIAGNOSIS — R42 Dizziness and giddiness: Secondary | ICD-10-CM | POA: Diagnosis not present

## 2023-02-27 DIAGNOSIS — C7951 Secondary malignant neoplasm of bone: Secondary | ICD-10-CM | POA: Diagnosis not present

## 2023-02-27 DIAGNOSIS — C3432 Malignant neoplasm of lower lobe, left bronchus or lung: Secondary | ICD-10-CM | POA: Diagnosis present

## 2023-02-27 DIAGNOSIS — R531 Weakness: Secondary | ICD-10-CM | POA: Diagnosis not present

## 2023-02-27 DIAGNOSIS — C3490 Malignant neoplasm of unspecified part of unspecified bronchus or lung: Secondary | ICD-10-CM | POA: Insufficient documentation

## 2023-02-27 DIAGNOSIS — R5383 Other fatigue: Secondary | ICD-10-CM | POA: Diagnosis not present

## 2023-02-27 DIAGNOSIS — C3412 Malignant neoplasm of upper lobe, left bronchus or lung: Secondary | ICD-10-CM

## 2023-02-27 LAB — CBC WITH DIFFERENTIAL (CANCER CENTER ONLY)
Abs Immature Granulocytes: 0.04 10*3/uL (ref 0.00–0.07)
Basophils Absolute: 0.1 10*3/uL (ref 0.0–0.1)
Basophils Relative: 1 %
Eosinophils Absolute: 0.1 10*3/uL (ref 0.0–0.5)
Eosinophils Relative: 1 %
HCT: 31.2 % — ABNORMAL LOW (ref 39.0–52.0)
Hemoglobin: 10.4 g/dL — ABNORMAL LOW (ref 13.0–17.0)
Immature Granulocytes: 0 %
Lymphocytes Relative: 22 %
Lymphs Abs: 2.2 10*3/uL (ref 0.7–4.0)
MCH: 30.9 pg (ref 26.0–34.0)
MCHC: 33.3 g/dL (ref 30.0–36.0)
MCV: 92.6 fL (ref 80.0–100.0)
Monocytes Absolute: 0.8 10*3/uL (ref 0.1–1.0)
Monocytes Relative: 8 %
Neutro Abs: 7 10*3/uL (ref 1.7–7.7)
Neutrophils Relative %: 68 %
Platelet Count: 362 10*3/uL (ref 150–400)
RBC: 3.37 MIL/uL — ABNORMAL LOW (ref 4.22–5.81)
RDW: 16.1 % — ABNORMAL HIGH (ref 11.5–15.5)
WBC Count: 10.3 10*3/uL (ref 4.0–10.5)
nRBC: 0 % (ref 0.0–0.2)

## 2023-02-27 LAB — CMP (CANCER CENTER ONLY)
ALT: 6 U/L (ref 0–44)
AST: 11 U/L — ABNORMAL LOW (ref 15–41)
Albumin: 3.4 g/dL — ABNORMAL LOW (ref 3.5–5.0)
Alkaline Phosphatase: 62 U/L (ref 38–126)
Anion gap: 5 (ref 5–15)
BUN: 19 mg/dL (ref 8–23)
CO2: 27 mmol/L (ref 22–32)
Calcium: 9.2 mg/dL (ref 8.9–10.3)
Chloride: 104 mmol/L (ref 98–111)
Creatinine: 0.83 mg/dL (ref 0.61–1.24)
GFR, Estimated: >60 mL/min (ref >60)
Glucose, Bld: 100 mg/dL — ABNORMAL HIGH (ref 70–99)
Potassium: 4.2 mmol/L (ref 3.5–5.1)
Sodium: 136 mmol/L (ref 135–145)
Total Bilirubin: 0.3 mg/dL (ref <1.2)
Total Protein: 6.9 g/dL (ref 6.5–8.1)

## 2023-02-27 MED ORDER — IOHEXOL 300 MG/ML  SOLN
75.0000 mL | Freq: Once | INTRAMUSCULAR | Status: AC | PRN
  Administered 2023-02-27: 75 mL via INTRAVENOUS

## 2023-03-06 ENCOUNTER — Inpatient Hospital Stay (HOSPITAL_BASED_OUTPATIENT_CLINIC_OR_DEPARTMENT_OTHER): Payer: Medicare Other | Admitting: Internal Medicine

## 2023-03-06 ENCOUNTER — Other Ambulatory Visit: Payer: Self-pay | Admitting: Family Medicine

## 2023-03-06 VITALS — BP 105/66 | HR 66 | Temp 98.5°F | Resp 17 | Ht 70.0 in | Wt 140.1 lb

## 2023-03-06 DIAGNOSIS — C3432 Malignant neoplasm of lower lobe, left bronchus or lung: Secondary | ICD-10-CM | POA: Diagnosis not present

## 2023-03-06 DIAGNOSIS — E782 Mixed hyperlipidemia: Secondary | ICD-10-CM

## 2023-03-06 DIAGNOSIS — C3490 Malignant neoplasm of unspecified part of unspecified bronchus or lung: Secondary | ICD-10-CM

## 2023-03-06 NOTE — Progress Notes (Signed)
[facility] [phone]   [phone]  OFFICE PROGRESS NOTE  [name], PA-C [address] [place] [address]  DIAGNOSIS:  Stage IIIB (T3, N2, M0) non-small cell lung cancer, squamous cell carcinoma presented with large left lower lobe lung mass in addition subcarinal and AP window nodal metastases. Diagnosed in September 2023.      PRIOR THERAPY:  1) Concurrent chemo/radiation with carboplatin for an AUC of 2 and paclitaxel 45 mg/m2 weekly. First dose expected 01/10/22. Status post 7 cycles.  Last dose was giving February 15, 2022 with partial response. 2) Consolidation treatment with immunotherapy with Imfinzi 1500 Mg IV every 4 weeks.  First dose March 31, 2022.  Status post 6 cycles.  This treatment was discontinued secondary to suspicious immunotherapy mediated pneumonitis.   CURRENT THERAPY: Observation.   INTERVAL HISTORY: Cameron Roman 72 y.o. male returns to the clinic today for follow-up visit. Discussed the use of AI scribe software for clinical note transcription with the patient, who gave verbal consent to proceed.  History of Present Illness   The patient, a 72 year old diagnosed with stage 3A non-small cell lung cancer (squamous cell carcinoma) in September 2023, underwent chemotherapy, radiation, and six cycles of consolidation immunotherapy with Imfinzi. The immunotherapy was discontinued due to suspected pneumonitis.  The patient reports a persistent pain in the left side of the chest, the same area where radiation was administered. The pain intensifies upon bending over, to the point where it feels like a potential heart attack. The patient denies taking any medication for this pain.  Despite the chest pain, the patient reports no issues with breathing. However, he does report a persistent cough, which is dry and non-productive. The patient also mentions episodes of dizziness, particularly upon standing up, and some days of  feeling unbalanced.  The patient has also experienced weight loss, which he attributes to medication for his heart. He denies any nausea or vomiting but does report occasional soreness in the stomach. He has been taking two types of acid reflux medication, Prilosec and Protonix, one in the morning and one at night.  The patient's cancer is located in the area where he experiences pain when bending over. He has been informed that this pain is unlikely to disappear due to the location of the cancer.       MEDICAL HISTORY: Past Medical History:  Diagnosis Date   Anxiety    Arthritis    Atrial fibrillation (HCC)    Chronic back pain    buldging disc   Chronic neck pain    ruptured disc   Cough    hx smoking   Dilated cardiomyopathy (HCC) 12/31/2022   TTE 12/27/2022: EF 40-45, global HK, normal RVSF, mildly elevated PASP, RVSP 42.4, mild LAE, moderate MR    Diverticulosis    Esophageal stricture    GERD (gastroesophageal reflux disease)    takes Prilosec daily   H/O hiatal hernia    Hearing loss    Hepatitis-C 2017   treated and cured per patient   Hiatal hernia    History of colon polyps    History of radiation therapy    Left Lung- 01/10/22-02/18/22- Dr. Antony Blackbird   Insomnia    takes Elavil nightly   Lipoma of abdominal wall 02/11/2011   Lung cancer (HCC)    Stroke (HCC)    x 2   Type 2 diabetes mellitus (HCC) 07/22/2020   patient denies DM, per MD  note pt is pre-diabetic    ALLERGIES:  is allergic to percocet [oxycodone-acetaminophen].  MEDICATIONS:  Current Outpatient Medications  Medication Sig Dispense Refill   amiodarone (PACERONE) 200 MG tablet Take 1 tablet (200 mg total) by mouth daily. 90 tablet 2   cyanocobalamin (VITAMIN B12) 1000 MCG/ML injection Inject into the muscle every 30 (thirty) days.     ELIQUIS 5 MG TABS tablet TAKE ONE TABLET BY MOUTH TWICE DAILY 60 tablet 5   fluticasone (FLONASE) 50 MCG/ACT nasal spray Place 1 spray into the nose in the  morning.     Fluticasone-Umeclidin-Vilant (TRELEGY ELLIPTA) 100-62.5-25 MCG/ACT AEPB Inhale 1 puff into the lungs daily. 60 each 11   gabapentin (NEURONTIN) 300 MG capsule Take 1 capsule (300 mg total) by mouth 2 (two) times daily.     HYDROcodone-acetaminophen (NORCO) 10-325 MG tablet Take 1 tablet by mouth 3 (three) times daily.     metoprolol succinate (TOPROL XL) 25 MG 24 hr tablet Take 1 tablet (25 mg total) by mouth every evening. 90 tablet 3   Omega-3 Fatty Acids (FISH OIL) 1000 MG CAPS Take 1,000 mg by mouth in the morning and at bedtime.     omeprazole (PRILOSEC) 20 MG capsule Take 1 capsule (20 mg total) by mouth daily. 90 capsule 3   pantoprazole (PROTONIX) 40 MG tablet TAKE ONE (1) TABLET BY MOUTH EVERY DAY (Patient taking differently: Take 40 mg by mouth at bedtime.) 90 tablet 3   pravastatin (PRAVACHOL) 20 MG tablet TAKE ONE (1) TABLET BY MOUTH EVERY DAY 30 tablet 0   tamsulosin (FLOMAX) 0.4 MG CAPS capsule Take 1 capsule (0.4 mg total) by mouth daily. 90 capsule 3   No current facility-administered medications for this visit.    SURGICAL HISTORY:  Past Surgical History:  Procedure Laterality Date   ANTERIOR CERVICAL DECOMP/DISCECTOMY FUSION  04/12/2011   Procedure: ANTERIOR CERVICAL DECOMPRESSION/DISCECTOMY FUSION 2 LEVELS;  Surgeon: Dorian Heckle, MD;  Location: [facility] NEURO ORS;  Service: Neurosurgery;  Laterality: N/A;  exploration of Cervical four - seven  fusion with redo Cervical six- seven, cervical three- four anterior cervical decompression with fusion interbody prothesis plating and bonegraft and C34 anterior cervical decompression with inte   APPENDECTOMY     at age 72   ATRIAL FIBRILLATION ABLATION N/A 02/17/2023   Procedure: ATRIAL FIBRILLATION ABLATION;  Surgeon: Nobie Putnam, MD;  Location: [facility] INVASIVE CV LAB;  Service: Cardiovascular;  Laterality: N/A;   BRONCHIAL BIOPSY  11/30/2021   Procedure: BRONCHIAL BIOPSIES;  Surgeon: Josephine Igo, DO;  Location: [facility]  ENDOSCOPY;  Service: Pulmonary;;   BRONCHIAL BRUSHINGS  11/30/2021   Procedure: BRONCHIAL BRUSHINGS;  Surgeon: Josephine Igo, DO;  Location: [facility] ENDOSCOPY;  Service: Pulmonary;;   CARDIAC CATHETERIZATION  06/03/2004   CARDIOVERSION N/A 12/28/2022   Procedure: CARDIOVERSION;  Surgeon: Thomasene Ripple, DO;  Location: [facility] INVASIVE CV LAB;  Service: Cardiovascular;  Laterality: N/A;   COLONOSCOPY     EP IMPLANTABLE DEVICE N/A 09/14/2015   Procedure: Loop Recorder Insertion;  Surgeon: Marinus Maw, MD;  Location: [facility] INVASIVE CV LAB;  Service: Cardiovascular;  Laterality: N/A;   FINE NEEDLE ASPIRATION  11/30/2021   Procedure: FINE NEEDLE ASPIRATION (FNA) LINEAR;  Surgeon: Josephine Igo, DO;  Location: [facility] ENDOSCOPY;  Service: Pulmonary;;   HAND SURGERY Right 03/15/2003   right x 3   INNER EAR SURGERY Bilateral    bil;titanium in both ears   lower back surgery  03/15/2007   had plates and screws  inserted   NECK SURGERY  03/15/2007   had insertion of  plates and screws   TEE WITHOUT CARDIOVERSION N/A 09/14/2015   Procedure: TRANSESOPHAGEAL ECHOCARDIOGRAM (TEE);  Surgeon: Vesta Mixer, MD;  Location: [facility] ENDOSCOPY;  Service: Cardiovascular;  Laterality: N/A;   TRANSFORAMINAL LUMBAR INTERBODY FUSION (TLIF) WITH PEDICLE SCREW FIXATION 1 LEVEL Left 09/27/2019   Procedure: Left Lumbar 5 Sacral 1 Transforaminal lumbar interbody fusion with exploration/removal of adjacent level hardware;  Surgeon: Maeola Harman, MD;  Location: [facility] OR;  Service: Neurosurgery;  Laterality: Left;  3C/RM 19   VIDEO BRONCHOSCOPY WITH ENDOBRONCHIAL ULTRASOUND N/A 11/30/2021   Procedure: VIDEO BRONCHOSCOPY WITH ENDOBRONCHIAL ULTRASOUND;  Surgeon: Josephine Igo, DO;  Location: [facility] ENDOSCOPY;  Service: Pulmonary;  Laterality: N/A;    REVIEW OF SYSTEMS:  Constitutional: positive for fatigue Eyes: negative Ears, nose, mouth, throat, and face: negative Respiratory: positive for dyspnea on exertion and pleurisy/chest  pain Cardiovascular: negative Gastrointestinal: positive for dyspepsia and dysphagia Genitourinary:negative Integument/breast: negative Hematologic/lymphatic: negative Musculoskeletal:positive for arthralgias Neurological: negative Behavioral/Psych: negative Endocrine: negative Allergic/Immunologic: negative   PHYSICAL EXAMINATION: General appearance: alert, cooperative, fatigued, and no distress Head: Normocephalic, without obvious abnormality, atraumatic Neck: no adenopathy, no JVD, supple, symmetrical, trachea midline, and thyroid not enlarged, symmetric, no tenderness/mass/nodules Lymph nodes: Cervical, supraclavicular, and axillary nodes normal. Resp: clear to auscultation bilaterally Back: symmetric, no curvature. ROM normal. No CVA tenderness. Cardio: regular rate and rhythm, S1, S2 normal, no murmur, click, rub or gallop GI: soft, non-tender; bowel sounds normal; no masses,  no organomegaly Extremities: extremities normal, atraumatic, no cyanosis or edema Neurologic: Alert and oriented X 3, normal strength and tone. Normal symmetric reflexes. Normal coordination and gait  ECOG PERFORMANCE STATUS: 1 - Symptomatic but completely ambulatory  Blood pressure 105/66, pulse 66, temperature 98.5 F (36.9 C), temperature source Temporal, resp. rate 17, height 5\' 10"  (1.778 m), weight 140 lb 1.6 oz (63.5 kg), SpO2 97%.  LABORATORY DATA: Lab Results  Component Value Date   WBC 10.3 02/27/2023   HGB 10.4 (L) 02/27/2023   HCT 31.2 (L) 02/27/2023   MCV 92.6 02/27/2023   PLT 362 02/27/2023      Chemistry      Component Value Date/Time   NA 136 02/27/2023 0811   NA 136 01/24/2023 0924   K 4.2 02/27/2023 0811   CL 104 02/27/2023 0811   CO2 27 02/27/2023 0811   BUN 19 02/27/2023 0811   BUN 18 01/24/2023 0924   CREATININE 0.83 02/27/2023 0811   CREATININE 0.80 08/30/2012 0955      Component Value Date/Time   CALCIUM 9.2 02/27/2023 0811   ALKPHOS 62 02/27/2023 0811   AST 11  (L) 02/27/2023 0811   ALT 6 02/27/2023 0811   BILITOT 0.3 02/27/2023 0811       RADIOGRAPHIC STUDIES: CT Chest W Contrast Result Date: 03/04/2023 CLINICAL DATA:  Non-small cell lung cancer (NSCLC), staging, completed chemotherapy and radiation therapy. Dyspnea and mid chest pain for 2 months. Weight loss. Restaging. * Tracking Code: BO * EXAM: CT CHEST WITH CONTRAST TECHNIQUE: Multidetector CT imaging of the chest was performed during intravenous contrast administration. RADIATION DOSE REDUCTION: This exam was performed according to the departmental dose-optimization program which includes automated exposure control, adjustment of the mA and/or kV according to patient size and/or use of iterative reconstruction technique. CONTRAST:  75mL OMNIPAQUE IOHEXOL 300 MG/ML  SOLN COMPARISON:  02/06/2023 coronary CT.  11/17/2022 chest CT. FINDINGS: Cardiovascular: Normal heart size. Trace pericardial effusion is new. Three-vessel  coronary atherosclerosis. Atherosclerotic nonaneurysmal thoracic aorta. Normal caliber pulmonary arteries. No central pulmonary emboli. Mediastinum/Nodes: No significant thyroid nodules. Unremarkable esophagus. No axillary adenopathy. Mildly enlarged 1.1 cm subcarinal node (series 2/image 89), previously 1.0 cm on 11/17/2022 chest CT, not substantially changed. Mildly enlarged 1.1 cm left paratracheal node (series 2/image 73), previously 0.9 cm, mildly increased. No right hilar adenopathy. Lungs/Pleura: No pneumothorax. No pleural effusion. Severe centrilobular emphysema with diffuse bronchial wall thickening. Saber sheath trachea. Enlarging central left lower lobe 4.9 x 4.8 cm solid lung mass contiguous with the posterior left hilum (series 2/image 88), previously 4.0 x 3.2 cm. Mild patchy linear left parahilar opacities are minimally increased favoring evolving postradiation change. No additional significant pulmonary nodules. Upper abdomen: No acute abnormality. Musculoskeletal: No  aggressive appearing focal osseous lesions. Mild thoracic spondylosis. Partially visualized surgical hardware from ACDF in the lower cervical spine. IMPRESSION: 1. Enlarging central left lower lobe 4.9 cm solid lung mass contiguous with the posterior left hilum, suspicious for progressive local tumor recurrence. Consider PET-CT for further evaluation. 2. Newly mildly enlarged left paratracheal lymph node and stable mildly enlarged subcarinal lymph node, suspicious for nodal metastases. 3. Trace pericardial effusion is new. 4. Three-vessel coronary atherosclerosis. 5. Aortic Atherosclerosis (ICD10-I70.0) and Emphysema (ICD10-J43.9). Electronically Signed   By: Delbert Phenix M.D.   On: 03/04/2023 12:04   EP STUDY Result Date: 02/17/2023 CONCLUSIONS: 1. Successful PVI 2. Successful ablation/isolation of the posterior wall 3. Intracardiac echo reveals normal LV function, trivial pericardial fluid, 4 distinct pulmonary veins. 4. No early apparent complications. Nobie Putnam, MD Cardiac Electrophysiology   ECHOCARDIOGRAM COMPLETE Result Date: 02/13/2023    ECHOCARDIOGRAM REPORT   Patient Name:   Cameron Roman Date of Exam: 02/13/2023 Medical Rec #:  [id number]    Height:       70.0 in Accession #:    [id number]   Weight:       149.0 lb Date of Birth:  04-03-1950    BSA:          1.842 m Patient Age:    72 years     BP:           114/61 mmHg Patient Gender: M            HR:           66 bpm. [facility] Procedure: 2D Echo, Cardiac Doppler and Color Doppler Indications:     Heart failure (HCC) [244010]  History:         Patient has prior history of Echocardiogram examinations, most                  recent 12/27/2022. Cardiomyopathy, Stroke, Arrythmias:Atrial                  Fibrillation; Risk Factors:Hypertension, Dyslipidemia and                  Prediabetes.  Sonographer:     Celesta Gentile RCS Referring Phys:  [id number] JOSHUA PARKER Diagnosing Phys: Dietrich Pates MD IMPRESSIONS  1. Left ventricular ejection  fraction, by estimation, is 55 to 60%. The left ventricle has normal function. The left ventricle has no regional wall motion abnormalities. Left ventricular diastolic parameters are indeterminate.  2. Right ventricular systolic function is normal. The right ventricular size is normal. There is normal pulmonary artery systolic pressure.  3. Compared to echo from Oct 2024, MR is improved. . Mild mitral valve regurgitation.  4. The aortic valve is  normal in structure. Aortic valve regurgitation is not visualized.  5. The inferior vena cava is dilated in size with >50% respiratory variability, suggesting right atrial pressure of 8 mmHg. Comparison(s): The left ventricular function has improved. FINDINGS  Left Ventricle: Left ventricular ejection fraction, by estimation, is 55 to 60%. The left ventricle has normal function. The left ventricle has no regional wall motion abnormalities. The left ventricular internal cavity size was normal in size. There is  no left ventricular hypertrophy. Left ventricular diastolic parameters are indeterminate. Right Ventricle: The right ventricular size is normal. Right vetricular wall thickness was not assessed. Right ventricular systolic function is normal. There is normal pulmonary artery systolic pressure. The tricuspid regurgitant velocity is 2.40 m/s, and with an assumed right atrial pressure of 8 mmHg, the estimated right ventricular systolic pressure is 31.0 mmHg. Left Atrium: Left atrial size was normal in size. Right Atrium: Right atrial size was normal in size. Pericardium: There is no evidence of pericardial effusion. Mitral Valve: Compared to echo from Oct 2024, MR is improved. There is mild thickening of the mitral valve leaflet(s). Mild mitral valve regurgitation. Tricuspid Valve: The tricuspid valve is normal in structure. Tricuspid valve regurgitation is trivial. Aortic Valve: The aortic valve is normal in structure. Aortic valve regurgitation is not visualized.  Pulmonic Valve: The pulmonic valve was normal in structure. Pulmonic valve regurgitation is not visualized. Aorta: The aortic root is normal in size and structure. Venous: The inferior vena cava is dilated in size with greater than 50% respiratory variability, suggesting right atrial pressure of 8 mmHg. IAS/Shunts: No atrial level shunt detected by color flow Doppler.  LEFT VENTRICLE PLAX 2D LVIDd:         5.00 cm      Diastology LVIDs:         3.30 cm      LV e' medial:    4.57 cm/s LV PW:         0.90 cm      LV E/e' medial:  11.6 LV IVS:        0.90 cm      LV e' lateral:   8.27 cm/s LVOT diam:     2.30 cm      LV E/e' lateral: 6.4 LV SV:         89 LV SV Index:   48 LVOT Area:     4.15 cm  LV Volumes (MOD) LV vol d, MOD A2C: 86.9 ml LV vol d, MOD A4C: 104.0 ml LV vol s, MOD A2C: 39.0 ml LV vol s, MOD A4C: 42.1 ml LV SV MOD A2C:     47.9 ml LV SV MOD A4C:     104.0 ml LV SV MOD BP:      59.4 ml RIGHT VENTRICLE RV S prime:     8.16 cm/s TAPSE (M-mode): 1.8 cm LEFT ATRIUM             Index        RIGHT ATRIUM           Index LA diam:        3.10 cm 1.68 cm/m   RA Area:     18.20 cm LA Vol (A2C):   55.9 ml 30.35 ml/m  RA Volume:   53.00 ml  28.78 ml/m LA Vol (A4C):   56.8 ml 30.84 ml/m LA Biplane Vol: 60.7 ml 32.96 ml/m  AORTIC VALVE LVOT Vmax:   81.50 cm/s LVOT Vmean:  54.800 cm/s LVOT VTI:  0.215 m  AORTA Ao Root diam: 3.90 cm MITRAL VALVE               TRICUSPID VALVE MV Area (PHT): 2.66 cm    TR Peak grad:   23.0 mmHg MV Decel Time: 285 msec    TR Vmax:        240.00 cm/s MV E velocity: 52.80 cm/s MV A velocity: 78.20 cm/s  SHUNTS MV E/A ratio:  0.68        Systemic VTI:  0.22 m                            Systemic Diam: 2.30 cm Dietrich Pates MD Electronically signed by Dietrich Pates MD Signature Date/Time: 02/13/2023/2:05:48 PM    Final (Updated)    CT CARDIAC MORPH/PULM VEIN W/CM&W/O CA SCORE Addendum Date: 02/09/2023 ADDENDUM REPORT: 02/09/2023 01:41 EXAM: OVER-READ INTERPRETATION  CT CHEST The  following report is an over-read performed by radiologist Dr. Myra Gianotti [facility] on 02/09/2023. This over-read does not include interpretation of cardiac or coronary anatomy or pathology. The coronary CTA interpretation by the cardiologist is attached. COMPARISON:  Chest CT 11/17/2022 FINDINGS: Interval increase in size of the left lower lobe/perihilar mass now measuring 4.6 x 3.8 cm in the axial plane, previously 4.0 x 3.2 cm on 11/17/2022. There is adjacent debris within the distal left mainstem bronchus extending into the left lower lobe bronchus. This is favored due to secretions/aspiration rather than tumor invasion. Advanced emphysema. Diffuse bronchial wall thickening. The visualized mediastinum, skeletal structures, and upper abdomen are unremarkable. IMPRESSION: 1. Increased size of the left lower lobe/perihilar mass. 2. Debris within the distal left mainstem bronchus extending into the left lower lobe bronchus. This is favored due to secretions/aspiration rather than tumor invasion. 3. Advanced emphysema chronic bronchitis. Electronically Signed   By: Minerva Fester M.D.   On: 02/09/2023 01:41   Result Date: 02/09/2023 CLINICAL DATA:  42M with squamous cell carcinoma of the lung, CVA, diabetes, COPD and atrial fibrillation scheduled for ablation. EXAM: Cardiac CTA TECHNIQUE: A non-contrast, gated CT scan was obtained with axial slices of 3 mm through the heart for calcium scoring. Calcium scoring was performed using the Agatston method. A 120 kV retrospective, gated, contrast cardiac scan was obtained. Gantry rotation speed was 250 msecs and collimation was 0.6 mm. Nitroglycerin was not given. A delayed scan was obtained to exclude left atrial appendage thrombus. The 3D dataset was reconstructed in 5% intervals of the 0-95% of the R-R cycle. Late systolic phases were analyzed on a dedicated workstation using MPR, MIP, and VRT modes. The patient received 80 cc of contrast.  FINDINGS: Image quality: Excellent. Noise artifact is: Limited. Pulmonary Veins: There is normal pulmonary vein drainage into the left atrium (2 on the right and 2 on the left). RUPV: 18.7 x 16.9 mm RLPV: 20.5 x 19.5 mm LUPV: 20.5 x 14.2 mm LLPV: 21.2 x 15.3 mm Left Atrium: The left atrial size is mildly dilated. There is no PFO/ASD. The left atrial appendage is large chicken wing type without thrombus on contrast or delayed imaging. The esophagus runs in the left atrial midline and is not in proximity to any of the pulmonary vein ostia. Coronary Arteries: CAC score of 177, which is 51st percentile for age-, race-, and sex-matched controls. Normal coronary origin. Right dominance. The study was performed without use of NTG and is insufficient for plaque evaluation. Right Atrium: Right atrial size  is within normal limits. Right Ventricle: The right ventricular cavity is within normal limits. Left Ventricle: The ventricular cavity size is within normal limits. Pericardium: Normal thickness without significant effusion or calcium present. Pulmonary Artery: Normal caliber. Cardiac valves: The aortic valve is trileaflet without significant calcification. The mitral valve is a normal structure without significant calcification. Aorta: Normal caliber.  Aortic atherosclerosis. Extra-cardiac findings: Left lung mass. See attached radiology report for non-cardiac structures. IMPRESSION: 1. There is normal pulmonary vein drainage into the left atrium with ostial measurements above. 2. There is no thrombus in the left atrial appendage. 3. The esophagus runs in the left atrial midline and is not in proximity to any of the pulmonary vein ostia. 4. No PFO/ASD. 5. Normal coronary origin. Right dominance. 6. CAC score of 177 which is 51st percentile for age-, race-, and sex-matched controls. 7. Left lung mass. See attached radiology report for non-cardiac structures. Chilton Si, MD Electronically Signed: By: Chilton Si  M.D. On: 02/07/2023 12:43    ASSESSMENT AND PLAN:  This is a very pleasant 72 years old white male diagnosed with a stage IIIb (T3, N2, M0) non-small cell lung cancer, squamous cell carcinoma presented with large left lower lobe lung mass in addition to subcarinal and AP window lymphadenopathy diagnosed in September 2023.  The patient underwent a course of concurrent chemoradiation with weekly carboplatin and paclitaxel status post 7 cycles of the chemotherapy.  He tolerated this treatment well with no concerning adverse effects except for the radiation-induced esophagitis and odynophagia.  He is recovering well from his treatment. He is currently undergoing a course of consolidation treatment with immunotherapy with Imfinzi 1500 Mg IV every 4 weeks status post 6 cycles.   The patient has been tolerating this treatment well but has been complaining of increasing fatigue and weakness as well as weight loss and worsening dyspnea at baseline increased with exertion.  He had repeat CT scan of the chest performed recently.  I personally and independently reviewed the scan images and discussed the results with the patient today.  His scan showed interval increase in size of posterior perihilar soft tissue mass within the left lung concerning for disease.  Recurrence but he also has a lot of postradiation changes in that area concerning for interstitial lung disease or hypersensitivity pneumonitis.  His treatment was discontinued. He was treated with a tapered dose of prednisone for the next 6 weeks.  The patient is currently on observation and he is feeling fine except for the cough as well as the left-sided chest pain. He had repeat CT scan of the chest performed recently.  I personally and independently reviewed the scan images and discussed the result with the patient today.  The scan showed increase in the left lower lobe lung mass as well as left paratracheal lymphadenopathy suspicious for disease  progression.    Stage IIIA Non-Small Cell Lung Cancer (NSCLC), Squamous Cell Carcinoma   Diagnosed in September 2023. Underwent chemotherapy and radiation, followed by six cycles of consolidation immunotherapy with Imfinzi, discontinued due to suspected pneumonitis. Reports severe chest pain at the radiation site, no shortness of breath, but persistent dry cough. Recent imaging shows an increase in the size of the primary tumor and left paratracheal node, raising concerns for potential cancer progression versus inflammation. Discussed the need for a PET scan to differentiate between inflammation and cancer progression.   - Order PET scan to evaluate for cancer progression or inflammation   - Schedule follow-up appointment in 2-3  weeks to review PET scan results and determine next steps    Chest Pain   Severe pain in the left chest area, exacerbated by bending over. Likely related to the radiation site and possibly the underlying lung cancer. No current medication for pain management. Discussed over-the-counter pain relief options.   - Recommend over-the-counter pain relief options such as acetaminophen or ibuprofen    Gastroesophageal Reflux Disease (GERD)   Taking both Prilosec and Protonix for acid reflux. Advised that only one medication is necessary as he works similarly. Explained that Protonix alone is sufficient for managing symptoms.   - Discontinue Prilosec   - Continue Protonix once daily    Dizziness and Balance Issues   Experiences dizziness and balance issues, particularly when changing positions. Likely related to antihypertensive medication. Discussed the impact of blood pressure medication on balance and the need to monitor blood pressure closely.   - Monitor blood pressure and adjust medication if necessary    Follow-up   - Ensure PET scan and follow-up appointments are scheduled on the same day or close together   - Confirm appointment dates to avoid conflicts.   The patient  was advised to call immediately if she has any other concerning symptoms in the interval. The patient voices understanding of current disease status and treatment options and is in agreement with the current care plan.  All questions were answered. The patient knows to call the clinic with any problems, questions or concerns. We can certainly see the patient much sooner if necessary.  The total time spent in the appointment was 30 minutes.  Disclaimer: This note was dictated with voice recognition software. Similar sounding words can inadvertently be transcribed and may not be corrected upon review.

## 2023-03-14 ENCOUNTER — Telehealth: Payer: Self-pay | Admitting: Medical Oncology

## 2023-03-14 NOTE — Telephone Encounter (Addendum)
 Coughing up blood with clots. . I instructed Darrius to go to ED.He said "ok"  abruptly hung up.

## 2023-03-16 ENCOUNTER — Encounter (HOSPITAL_COMMUNITY)
Admission: RE | Admit: 2023-03-16 | Discharge: 2023-03-16 | Disposition: A | Discharge status: Home / Self Care | Payer: Medicare Other | Source: Ambulatory Visit | Attending: Internal Medicine | Admitting: Internal Medicine

## 2023-03-16 DIAGNOSIS — C3490 Malignant neoplasm of unspecified part of unspecified bronchus or lung: Secondary | ICD-10-CM | POA: Insufficient documentation

## 2023-03-16 LAB — GLUCOSE, CAPILLARY: Glucose-Capillary: 111 mg/dL — ABNORMAL HIGH (ref 70–99)

## 2023-03-16 MED ORDER — FLUDEOXYGLUCOSE F - 18 (FDG) INJECTION
7.0000 | Freq: Once | INTRAVENOUS | Status: AC
  Administered 2023-03-16: 6.477 via INTRAVENOUS

## 2023-03-17 ENCOUNTER — Ambulatory Visit (HOSPITAL_COMMUNITY)
Admission: RE | Admit: 2023-03-17 | Discharge: 2023-03-17 | Disposition: A | Discharge status: Home / Self Care | Payer: Medicare Other | Source: Ambulatory Visit | Attending: Physician Assistant | Admitting: Physician Assistant

## 2023-03-17 ENCOUNTER — Encounter (HOSPITAL_COMMUNITY): Payer: Self-pay | Admitting: Physician Assistant

## 2023-03-17 VITALS — BP 102/68 | HR 89 | Ht 70.0 in | Wt 133.2 lb

## 2023-03-17 DIAGNOSIS — D6869 Other thrombophilia: Secondary | ICD-10-CM | POA: Insufficient documentation

## 2023-03-17 DIAGNOSIS — R042 Hemoptysis: Secondary | ICD-10-CM | POA: Diagnosis not present

## 2023-03-17 DIAGNOSIS — Z79899 Other long term (current) drug therapy: Secondary | ICD-10-CM | POA: Diagnosis not present

## 2023-03-17 DIAGNOSIS — J449 Chronic obstructive pulmonary disease, unspecified: Secondary | ICD-10-CM | POA: Diagnosis not present

## 2023-03-17 DIAGNOSIS — Z5181 Encounter for therapeutic drug level monitoring: Secondary | ICD-10-CM | POA: Diagnosis not present

## 2023-03-17 DIAGNOSIS — R1310 Dysphagia, unspecified: Secondary | ICD-10-CM | POA: Diagnosis not present

## 2023-03-17 DIAGNOSIS — Z85118 Personal history of other malignant neoplasm of bronchus and lung: Secondary | ICD-10-CM | POA: Insufficient documentation

## 2023-03-17 DIAGNOSIS — Z7901 Long term (current) use of anticoagulants: Secondary | ICD-10-CM | POA: Insufficient documentation

## 2023-03-17 DIAGNOSIS — I4819 Other persistent atrial fibrillation: Secondary | ICD-10-CM | POA: Diagnosis not present

## 2023-03-17 MED ORDER — AMIODARONE HCL 200 MG PO TABS: 100.0000 mg | ORAL_TABLET | Freq: Every day | ORAL | Status: DC

## 2023-03-17 NOTE — Patient Instructions (Addendum)
Decrease amiodarone to 100mg once a day  

## 2023-03-17 NOTE — Progress Notes (Signed)
 Primary Care Physician: Kristie Morene CROME, PA-C Primary Cardiologist: None Electrophysiologist: Danelle Birmingham, MD  Referring Physician: Dr Birmingham   Cameron Roman is a 73 y.o. male with a history of non-small cell lung cancer, COPD, CVA, atrial fibrillation who presents for follow up in the [facility].  The patient was initially diagnosed with atrial fibrillation on an ILR which has been placed following a cryptogenic stroke. Patient is on Eliquis  for a CHADS2VASC score of 3. He was seen by Dr Birmingham and found to be persistently in afib, dofetilide  admission recommended.   On follow up today, patient is s/p afib ablation with Dr Kennyth on 02/17/23. He reports that since around 3 days post ablation he has had difficulty swallowing. He did have an episode of hemoptysis on 03/14/23 but has not had recurrence. He has not been able to eat much during meals. He also have nonspecific symptoms of not feeling well overall. Of note, he had a PET scan yesterday to restage his cancer because his recent CT scan showed possible cancer progression. He denies groin issues post ablation and remains in SR.  Today, he denies symptoms of palpitations,  orthopnea, PND, lower extremity edema, dizziness, presyncope, syncope, snoring, daytime somnolence, bleeding, or neurologic sequela. The patient is tolerating medications without difficulties and is otherwise without complaint today.    Atrial Fibrillation Risk Factors:  he does not have symptoms or diagnosis of sleep apnea. he does not have a history of rheumatic fever.   Atrial Fibrillation Management history:  Previous antiarrhythmic drugs: dofetilide  (failed), amiodarone   Previous cardioversions: none Previous ablations: 02/17/23 Anticoagulation history: Eliquis   ROS- All systems are reviewed and negative except as per the HPI above.  Past Medical History:  Diagnosis Date   Anxiety    Arthritis    Atrial fibrillation (HCC)     Chronic back pain    buldging disc   Chronic neck pain    ruptured disc   Cough    hx smoking   Dilated cardiomyopathy (HCC) 12/31/2022   TTE 12/27/2022: EF 40-45, global HK, normal RVSF, mildly elevated PASP, RVSP 42.4, mild LAE, moderate MR    Diverticulosis    Esophageal stricture    GERD (gastroesophageal reflux disease)    takes Prilosec daily   H/O hiatal hernia    Hearing loss    Hepatitis-C 2017   treated and cured per patient   Hiatal hernia    History of colon polyps    History of radiation therapy    Left Lung- 01/10/22-02/18/22- Dr. Lynwood Nasuti   Insomnia    takes Elavil  nightly   Lipoma of abdominal wall 02/11/2011   Lung cancer (HCC)    Stroke (HCC)    x 2   Type 2 diabetes mellitus (HCC) 07/22/2020   patient denies DM, per MD note pt is pre-diabetic    Current Outpatient Medications  Medication Sig Dispense Refill   cyanocobalamin  (VITAMIN B12) 1000 MCG/ML injection Inject into the muscle every 30 (thirty) days.     ELIQUIS  5 MG TABS tablet TAKE ONE TABLET BY MOUTH TWICE DAILY 60 tablet 5   fluticasone  (FLONASE ) 50 MCG/ACT nasal spray Place 1 spray into the nose in the morning.     Fluticasone -Umeclidin-Vilant (TRELEGY ELLIPTA ) 100-62.5-25 MCG/ACT AEPB Inhale 1 puff into the lungs daily. 60 each 11   gabapentin  (NEURONTIN ) 300 MG capsule Take 1 capsule (300 mg total) by mouth 2 (two) times daily.     HYDROcodone -acetaminophen  (NORCO) 10-325  MG tablet Take 1 tablet by mouth 3 (three) times daily.     metoprolol  succinate (TOPROL  XL) 25 MG 24 hr tablet Take 1 tablet (25 mg total) by mouth every evening. 90 tablet 3   Omega-3 Fatty Acids (FISH OIL ) 1000 MG CAPS Take 1,000 mg by mouth in the morning and at bedtime.     omeprazole  (PRILOSEC) 20 MG capsule Take 1 capsule (20 mg total) by mouth daily. 90 capsule 3   pantoprazole  (PROTONIX ) 40 MG tablet TAKE ONE (1) TABLET BY MOUTH EVERY DAY 90 tablet 3   pravastatin  (PRAVACHOL ) 20 MG tablet TAKE ONE (1) TABLET  BY MOUTH EVERY DAY 30 tablet 0   tamsulosin  (FLOMAX ) 0.4 MG CAPS capsule Take 1 capsule (0.4 mg total) by mouth daily. 90 capsule 3   amiodarone  (PACERONE ) 200 MG tablet Take 0.5 tablets (100 mg total) by mouth daily.     No current facility-administered medications for this encounter.    Physical Exam: BP 102/68   Pulse 89   Ht 5' 10 (1.778 m)   Wt 60.4 kg   BMI 19.11 kg/m   GEN: Well nourished, well developed in no acute distress NECK: No JVD; No carotid bruits CARDIAC: Regular rate and rhythm, no murmurs, rubs, gallops RESPIRATORY:  Clear to auscultation without rales, wheezing or rhonchi  ABDOMEN: Soft, non-tender, non-distended EXTREMITIES:  No edema; No deformity    Wt Readings from Last 3 Encounters:  03/17/23 60.4 kg  03/06/23 63.5 kg  02/17/23 66.7 kg     EKG today demonstrates  SR Vent. rate 89 BPM PR interval 166 ms QRS duration 96 ms QT/QTcB 396/481 ms   Echo 12/27/22 demonstrated   1. Left ventricular ejection fraction, by estimation, is 40 to 45%. The  left ventricle has mildly decreased function. The left ventricle  demonstrates global hypokinesis. Left ventricular diastolic parameters are  indeterminate. Elevated left atrial pressure.   2. Right ventricular systolic function is normal. The right ventricular  size is normal. There is mildly elevated pulmonary artery systolic  pressure. The estimated right ventricular systolic pressure is 42.4 mmHg.   3. Left atrial size was mildly dilated.   4. The mitral valve is normal in structure. At least moderate mitral  valve regurgitation, moderate to severe in some R-R intervals with TVI  ratio 1.36. Regurgitation appears worst in the three chamber view.   5. The aortic valve is tricuspid. There is mild thickening of the aortic  valve. Aortic valve regurgitation is not visualized. No aortic stenosis is  present.   Comparison(s): LVEF worse from historic reporting.    CHA2DS2-VASc Score = 4  The  patient's score is based upon: CHF History: 1 HTN History: 0 Diabetes History: 0 Stroke History: 2 Vascular Disease History: 0 Age Score: 1 Gender Score: 0         ASSESSMENT AND PLAN: Persistent Atrial Fibrillation (ICD10:  I48.19) The patient's CHA2DS2-VASc score is 4, indicating a 4.8% annual risk of stroke.   Failed dofetilide , loaded on amiodarone  S/p afib ablation 02/17/23 Patient appears to be maintaining SR Will decrease amiodarone  to 100 mg daily, patient feels this medication can be contributing to his symptoms.  Continue Eliquis  5 mg BID with no missed doses for 3 months post ablation.   Secondary Hypercoagulable State (ICD10:  D68.69) The patient is at significant risk for stroke/thromboembolism based upon his CHA2DS2-VASc Score of 4.  Continue Apixaban  (Eliquis ).   Lung Cancer Stage IIIB non-small cell lung cancer Followed by Dr  Mohamed Had PET scan yesterday, results pending   Systolic dysfunction  EF 40-45% Suspected tachycardia mediated Fluid status appears stable today  Dysphagia Patient having difficulty swallowing since ablation. ? Trauma from endotracheal tube. He does have a h/o esophageal strictures. Given his episode of hemoptysis, will have him see ENT to evaluate.     Follow up with Dr Waddell and Dr Kennyth as scheduled.       Daril Kicks PA-C [facility] [address] [place], [place] [address] [phone]

## 2023-03-21 ENCOUNTER — Encounter: Payer: Self-pay | Admitting: Gastroenterology

## 2023-03-21 ENCOUNTER — Ambulatory Visit: Payer: Medicare Other | Admitting: Gastroenterology

## 2023-03-21 ENCOUNTER — Telehealth: Payer: Self-pay | Admitting: *Deleted

## 2023-03-21 VITALS — BP 102/70 | HR 78 | Ht 70.0 in | Wt 134.0 lb

## 2023-03-21 DIAGNOSIS — Z7901 Long term (current) use of anticoagulants: Secondary | ICD-10-CM

## 2023-03-21 DIAGNOSIS — R1013 Epigastric pain: Secondary | ICD-10-CM | POA: Diagnosis not present

## 2023-03-21 DIAGNOSIS — R1319 Other dysphagia: Secondary | ICD-10-CM

## 2023-03-21 DIAGNOSIS — R1310 Dysphagia, unspecified: Secondary | ICD-10-CM | POA: Diagnosis not present

## 2023-03-21 DIAGNOSIS — K222 Esophageal obstruction: Secondary | ICD-10-CM

## 2023-03-21 NOTE — Patient Instructions (Signed)
 Will call back once we receive clearance.   _______________________________________________________  If your blood pressure at your visit was 140/90 or greater, please contact your primary care physician to follow up on this.  _______________________________________________________  If you are age 72 or older, your body mass index should be between 23-30. Your Body mass index is 19.23 kg/m. If this is out of the aforementioned range listed, please consider follow up with your Primary Care Provider.  If you are age 29 or younger, your body mass index should be between 19-25. Your Body mass index is 19.23 kg/m. If this is out of the aformentioned range listed, please consider follow up with your Primary Care Provider.   ________________________________________________________  The [facility] providers would like to encourage you to use MYCHART to communicate with providers for non-urgent requests or questions.  Due to long hold times on the telephone, sending your provider a message by [facility] may be a faster and more efficient way to get a response.  Please allow 48 business hours for a response.  Please remember that this is for non-urgent requests.  _______________________________________________________

## 2023-03-21 NOTE — Progress Notes (Signed)
 03/21/2023 Cameron Roman [id number] 06-12-1950   HISTORY OF PRESENT ILLNESS:  This is a 73 year old male who is a patient of Dr. Nancyann with a known history of esophageal stenosis and also history of lung cancer status post chemo and radiation who is complaining of severe recurrence of dysphagia, odynophagia as well and epigastric pain.  Has been going on for well over a year, but worse the past few months.  Unable to tolerate much by mouth.  Is trying to drink liquids, but they even regurgitate back up.  Says that when he tries to eat or drink it feels like it gets stuck and it causes all kinds of pressure back into his chest.  Has a lot of belching.  Had a PET scan done the other day, but results are not back yet.  Unfortunately he had an atrial fibrillation ablation about a month ago.  Is on Eliquis .  Follows with Dr. Kennyth and Dr. Waddell.   Past Medical History:  Diagnosis Date   Anxiety    Arthritis    Atrial fibrillation (HCC)    Chronic back pain    buldging disc   Chronic neck pain    ruptured disc   Cough    hx smoking   Dilated cardiomyopathy (HCC) 12/31/2022   TTE 12/27/2022: EF 40-45, global HK, normal RVSF, mildly elevated PASP, RVSP 42.4, mild LAE, moderate MR    Diverticulosis    Esophageal stricture    GERD (gastroesophageal reflux disease)    takes Prilosec daily   H/O hiatal hernia    Hearing loss    Hepatitis-C 2017   treated and cured per patient   Hiatal hernia    History of colon polyps    History of radiation therapy    Left Lung- 01/10/22-02/18/22- Dr. Lynwood Nasuti   Insomnia    takes Elavil  nightly   Lipoma of abdominal wall 02/11/2011   Lung cancer (HCC)    Stroke (HCC)    x 2   Type 2 diabetes mellitus (HCC) 07/22/2020   patient denies DM, per MD note pt is pre-diabetic   Past Surgical History:  Procedure Laterality Date   ANTERIOR CERVICAL DECOMP/DISCECTOMY FUSION  04/12/2011   Procedure: ANTERIOR CERVICAL DECOMPRESSION/DISCECTOMY FUSION  2 LEVELS;  Surgeon: Fairy JONETTA Levels, MD;  Location: [facility] NEURO ORS;  Service: Neurosurgery;  Laterality: N/A;  exploration of Cervical four - seven  fusion with redo Cervical six- seven, cervical three- four anterior cervical decompression with fusion interbody prothesis plating and bonegraft and C34 anterior cervical decompression with inte   APPENDECTOMY     at age 41   ATRIAL FIBRILLATION ABLATION N/A 02/17/2023   Procedure: ATRIAL FIBRILLATION ABLATION;  Surgeon: Kennyth Chew, MD;  [facility]: [facility] INVASIVE CV LAB;  Service: Cardiovascular;  Laterality: N/A;   BRONCHIAL BIOPSY  11/30/2021   Procedure: BRONCHIAL BIOPSIES;  Surgeon: Brenna Adine CROME, DO;  Location: [facility] ENDOSCOPY;  Service: Pulmonary;;   BRONCHIAL BRUSHINGS  11/30/2021   Procedure: BRONCHIAL BRUSHINGS;  Surgeon: Brenna Adine CROME, DO;  Location: [facility] ENDOSCOPY;  Service: Pulmonary;;   CARDIAC CATHETERIZATION  06/03/2004   CARDIOVERSION N/A 12/28/2022   Procedure: CARDIOVERSION;  Surgeon: Sheena Pugh, DO;  Location: [facility] INVASIVE CV LAB;  Service: Cardiovascular;  Laterality: N/A;   COLONOSCOPY     EP IMPLANTABLE DEVICE N/A 09/14/2015   Procedure: Loop Recorder Insertion;  Surgeon: Danelle LELON Waddell, MD;  Location: [facility] INVASIVE CV LAB;  Service: Cardiovascular;  Laterality: N/A;   FINE NEEDLE  ASPIRATION  11/30/2021   Procedure: FINE NEEDLE ASPIRATION (FNA) LINEAR;  Surgeon: Brenna Adine CROME, DO;  Location: [facility] ENDOSCOPY;  Service: Pulmonary;;   HAND SURGERY Right 03/15/2003   right x 3   INNER EAR SURGERY Bilateral    bil;titanium in both ears   lower back surgery  03/15/2007   had plates and screws inserted   NECK SURGERY  03/15/2007   had insertion of  plates and screws   TEE WITHOUT CARDIOVERSION N/A 09/14/2015   Procedure: TRANSESOPHAGEAL ECHOCARDIOGRAM (TEE);  Surgeon: Aleene JINNY Passe, MD;  Location: [facility] ENDOSCOPY;  Service: Cardiovascular;  Laterality: N/A;   TRANSFORAMINAL LUMBAR INTERBODY FUSION (TLIF) WITH PEDICLE SCREW FIXATION 1  LEVEL Left 09/27/2019   Procedure: Left Lumbar 5 Sacral 1 Transforaminal lumbar interbody fusion with exploration/removal of adjacent level hardware;  Surgeon: Unice Pac, MD;  Location: [facility] OR;  Service: Neurosurgery;  Laterality: Left;  3C/RM 19   VIDEO BRONCHOSCOPY WITH ENDOBRONCHIAL ULTRASOUND N/A 11/30/2021   Procedure: VIDEO BRONCHOSCOPY WITH ENDOBRONCHIAL ULTRASOUND;  Surgeon: Brenna Adine CROME, DO;  Location: [facility] ENDOSCOPY;  Service: Pulmonary;  Laterality: N/A;    reports that he quit smoking about 20 years ago. His smoking use included cigarettes. He started smoking about 61 years ago. He has a 41 pack-year smoking history. He has never used smokeless tobacco. He reports that he does not currently use alcohol after a past usage of about 1.0 standard drink of alcohol per week. He reports that he does not use drugs. family history includes Cancer in his brother and father; Colon cancer in his father, maternal grandfather, and maternal grandmother; Healthy in his daughter and son; Heart disease in his maternal uncle; Other in his mother; Stroke in his maternal uncle. Allergies  Allergen Reactions   Percocet [Oxycodone -Acetaminophen ] Itching      Outpatient Encounter Medications as of 03/21/2023  Medication Sig   amiodarone  (PACERONE ) 200 MG tablet Take 0.5 tablets (100 mg total) by mouth daily.   cyanocobalamin  (VITAMIN B12) 1000 MCG/ML injection Inject into the muscle every 30 (thirty) days.   ELIQUIS  5 MG TABS tablet TAKE ONE TABLET BY MOUTH TWICE DAILY   fluticasone  (FLONASE ) 50 MCG/ACT nasal spray Place 1 spray into the nose in the morning.   Fluticasone -Umeclidin-Vilant (TRELEGY ELLIPTA ) 100-62.5-25 MCG/ACT AEPB Inhale 1 puff into the lungs daily.   gabapentin  (NEURONTIN ) 300 MG capsule Take 1 capsule (300 mg total) by mouth 2 (two) times daily.   HYDROcodone -acetaminophen  (NORCO) 10-325 MG tablet Take 1 tablet by mouth 3 (three) times daily.   metoprolol  succinate (TOPROL  XL) 25 MG  24 hr tablet Take 1 tablet (25 mg total) by mouth every evening.   Omega-3 Fatty Acids (FISH OIL ) 1000 MG CAPS Take 1,000 mg by mouth in the morning and at bedtime.   omeprazole  (PRILOSEC) 20 MG capsule Take 1 capsule (20 mg total) by mouth daily.   pantoprazole  (PROTONIX ) 40 MG tablet TAKE ONE (1) TABLET BY MOUTH EVERY DAY   pravastatin  (PRAVACHOL ) 20 MG tablet TAKE ONE (1) TABLET BY MOUTH EVERY DAY   tamsulosin  (FLOMAX ) 0.4 MG CAPS capsule Take 1 capsule (0.4 mg total) by mouth daily.   No facility-administered encounter medications on file as of 03/21/2023.     REVIEW OF SYSTEMS  : All other systems reviewed and negative except where noted in the History of Present Illness.   PHYSICAL EXAM: BP 102/70   Pulse 78   Ht 5' 10 (1.778 m)   Wt 134 lb (60.8 kg)  BMI 19.23 kg/m  General: Well developed white male in no acute distress Head: Normocephalic and atraumatic Eyes:  Sclerae anicteric, conjunctiva pink. Ears: Normal auditory acuity Lungs: Clear throughout to auscultation; no W/R/R. Heart: Regular rate and rhythm; no M/R/G. Abdomen: Soft, non-distended.  BS present.  Epigastric TTP. Musculoskeletal: Symmetrical with no gross deformities  Skin: No lesions on visible extremities Extremities: No edema  Neurological: Alert oriented x 4, grossly non-focal Psychological:  Alert and cooperative. Normal mood and affect  ASSESSMENT AND PLAN: *73 year old male with a known history of esophageal stenosis and also history of lung cancer status post chemo and radiation who is complaining of severe recurrence of dysphagia, odynophagia as well and epigastric pain.  Has been going on for well over a year, but worse the past few months.  Unable to tolerate much by mouth.  Is trying to drink liquids, but they even regurgitate back up.  Could be a recurrent stenosis, but could have some radiation induced stricture or esophagitis as well.  Had a PET scan done the other day, but results are not back  yet.  Unfortunately he had an atrial fibrillation ablation about a month ago.  Is on Eliquis .  Likely will need another endoscopy with possible dilation.  Will need to get cardiac clearance and okay to hold Eliquis  if that is going to be possible in this timeline.  Considered esophagram and we may still want to proceed with that, but he was skeptical/nervous about drinking the barium.  Will reach out to cardiology.   CC:  Kristie Morene CROME, PA-C

## 2023-03-21 NOTE — Telephone Encounter (Signed)
 [facility] Pre-operative Risk Assessment     Request for surgical clearance:     Endoscopy Procedure  What type of surgery is being performed?     EGD w/dilation  When is this surgery scheduled?     TBD  What type of clearance is required ?   Pharmacy/Cardiac  Are there any medications that need to be held prior to surgery and how long? Eliquis  2 days  Practice name and name of physician performing surgery?      [facility]  What is your office phone and fax number?      Phone- [phone]  Fax- [phone]  Anesthesia type (None, local, MAC, general) ?       MAC   Please route your response to Powell Misty, CMA

## 2023-03-21 NOTE — Progress Notes (Signed)
 Esophagram is a good idea. R/O stricture vs extrinsic compression from cancer.

## 2023-03-22 ENCOUNTER — Ambulatory Visit: Payer: Medicare Other | Admitting: Pulmonary Disease

## 2023-03-22 ENCOUNTER — Encounter: Payer: Self-pay | Admitting: Pulmonary Disease

## 2023-03-22 VITALS — BP 120/64 | HR 79 | Ht 70.0 in | Wt 134.0 lb

## 2023-03-22 DIAGNOSIS — R0609 Other forms of dyspnea: Secondary | ICD-10-CM

## 2023-03-22 DIAGNOSIS — R1319 Other dysphagia: Secondary | ICD-10-CM

## 2023-03-22 DIAGNOSIS — C3412 Malignant neoplasm of upper lobe, left bronchus or lung: Secondary | ICD-10-CM | POA: Diagnosis not present

## 2023-03-22 NOTE — Telephone Encounter (Signed)
 Noted. As clearance may need to be expedited based on symptoms per GI input, will route to Dr. Kennyth (who did ablation) to find out thoughts on interrupting anticoagulation this soon after ablation procedure. See Gi notes below. Dr. Kennyth - Please route response to P CV DIV PREOP (the pre-op pool). Thank you.

## 2023-03-22 NOTE — Telephone Encounter (Signed)
 Helping in preop today. Patient is s/p ablation 12/6 and has appt with Dr. Waddell scheduled 1/20 - will route to [facility] with GI to find out if OK to wait to get MD clearance at that appt. Jessica - Please route response to P CV DIV PREOP (the pre-op pool). Thank you. If so, will plan to bundle with pharm recs then send FYI to Dr. Waddell for that visit.

## 2023-03-22 NOTE — Patient Instructions (Addendum)
 Nice to see you again  Continue Trelegy 1 puff once a day, you should have plenty of refills  I will message our medical team about your nutrition  Return to clinic in 3 months or sooner as needed with Dr. Judeth Horn

## 2023-03-22 NOTE — Telephone Encounter (Signed)
 Noted! Will route back to GI APP and Dr. Waddell to be aware to review at upcoming OV.  Will hold off routing to pharm team as recommendations since holding anticoag will depend on EP's input at that time. I added preop to appt notes. Will otherwise remove from preop APP box as separate preop APP input not needed at this time.

## 2023-03-22 NOTE — Progress Notes (Signed)
 @Patient  ID: Cameron Roman, male    DOB: 1951-02-20, 73 y.o.   MRN: [id number]  Chief Complaint  Patient presents with   Follow-up    Pt c/o SOB, Weakness, and Loss of Apatite. Pt request Handicap Placard.     Referring provider: Kristie Morene LITTIE DEVONNA  HPI:   73 y.o. man initial consultation for lung mass status post biopsy and SBRT and chemotherapy and few weeks of immunotherapy stopped after concern for pneumonitis here with chief complaint of dyspnea on exertion and excessive daytime sleepiness.  Multiple oncology notes reviewed.  Returns for scheduled follow-up.  Status post radiation and chemo for his lung mass.  Following with oncology.  At last visit was put on Trelegy given some dyspnea and associated cough.  At last visit sleep test was ordered.  This was scheduled.  He then canceled the test.  Most recent CT scan 02/2023 reviewed that shows similar-appearing mass left lower lobe with surrounding mild fibrotic changes, posttreatment/radiation versus cancer evolution on my review and interpretation.  Per radiologist interpretation, some concern for possible enlargement of mass as well as some lymph node enlargement.  PET scan has been obtained but has not yet to be read/interpreted.  HPI initial visit: Unclear reason why a chest x-ray is obtained 09/02/2021.  Recent shortness of breath.  He denies any respiratory symptoms at that time.  Denies any respiratory symptoms ever.  No dyspnea etc.  No cough etc.  Regardless chest x-ray is obtained.  Per report that showed left-sided spiculated nodule versus mass.  This prompted CT scan 4 days later with contrast of the chest.  On my review of results this reports up to 5 cm spiculated left lower lobe near the superior segment mass with extension towards the hilum.  No lymphadenopathy was noted.  This prompted referral to the [facility].  He met with oncology 09/2021.  Recommended biopsy referral to pulmonary as well as PET scan.  PET scan  has not been performed.  He has not followed up.  He is just now seeing us  for consideration of biopsy.  He shares his wife died from lung cancer 3 years ago.  Stage IV small cell.  He was everywhere.  At time of diagnosis.  He understands lung cancer.  We discussed at length the role and rationale for biopsy.  He expressed understanding is amenable to this.  Case discussed with Dr. Brenna today.  We established a timeframe for repeat CT scan as well as biopsy in the coming week or 2.  Being scheduled today for later in August.  PMH: Tobacco abuse in remission, atrial fibrillation on Eliquis , cirrhosis Surgical history: Cervical spine surgery, appendectomy, hand surgery, Family history: Father with colon cancer, mother with perforated bowel Social history: Former smoker, 41-pack-year, quit 2002    Questionaires / Pulmonary Flowsheets:   ACT:      No data to display          MMRC:     No data to display          Epworth:      No data to display          Tests:   FENO:  No results found for: NITRICOXIDE  PFT:     No data to display          WALK:      No data to display          Imaging: Personally reviewed CT Chest W Contrast Result  Date: 03/04/2023 CLINICAL DATA:  Non-small cell lung cancer (NSCLC), staging, completed chemotherapy and radiation therapy. Dyspnea and mid chest pain for 2 months. Weight loss. Restaging. * Tracking Code: BO * EXAM: CT CHEST WITH CONTRAST TECHNIQUE: Multidetector CT imaging of the chest was performed during intravenous contrast administration. RADIATION DOSE REDUCTION: This exam was performed according to the departmental dose-optimization program which includes automated exposure control, adjustment of the mA and/or kV according to patient size and/or use of iterative reconstruction technique. CONTRAST:  75mL OMNIPAQUE  IOHEXOL  300 MG/ML  SOLN COMPARISON:  02/06/2023 coronary CT.  11/17/2022 chest CT. FINDINGS:  Cardiovascular: Normal heart size. Trace pericardial effusion is new. Three-vessel coronary atherosclerosis. Atherosclerotic nonaneurysmal thoracic aorta. Normal caliber pulmonary arteries. No central pulmonary emboli. Mediastinum/Nodes: No significant thyroid  nodules. Unremarkable esophagus. No axillary adenopathy. Mildly enlarged 1.1 cm subcarinal node (series 2/image 89), previously 1.0 cm on 11/17/2022 chest CT, not substantially changed. Mildly enlarged 1.1 cm left paratracheal node (series 2/image 73), previously 0.9 cm, mildly increased. No right hilar adenopathy. Lungs/Pleura: No pneumothorax. No pleural effusion. Severe centrilobular emphysema with diffuse bronchial wall thickening. Saber sheath trachea. Enlarging central left lower lobe 4.9 x 4.8 cm solid lung mass contiguous with the posterior left hilum (series 2/image 88), previously 4.0 x 3.2 cm. Mild patchy linear left parahilar opacities are minimally increased favoring evolving postradiation change. No additional significant pulmonary nodules. Upper abdomen: No acute abnormality. Musculoskeletal: No aggressive appearing focal osseous lesions. Mild thoracic spondylosis. Partially visualized surgical hardware from ACDF in the lower cervical spine. IMPRESSION: 1. Enlarging central left lower lobe 4.9 cm solid lung mass contiguous with the posterior left hilum, suspicious for progressive local tumor recurrence. Consider PET-CT for further evaluation. 2. Newly mildly enlarged left paratracheal lymph node and stable mildly enlarged subcarinal lymph node, suspicious for nodal metastases. 3. Trace pericardial effusion is new. 4. Three-vessel coronary atherosclerosis. 5. Aortic Atherosclerosis (ICD10-I70.0) and Emphysema (ICD10-J43.9). Electronically Signed   By: Selinda DELENA Blue M.D.   On: 03/04/2023 12:04    Lab Results: Personally reviewed CBC    Component Value Date/Time   WBC 10.3 02/27/2023 0811   WBC 12.6 (H) 11/30/2021 1150   RBC 3.37 (L)  02/27/2023 0811   HGB 10.4 (L) 02/27/2023 0811   HGB 11.9 (L) 01/24/2023 0924   HCT 31.2 (L) 02/27/2023 0811   HCT 36.1 (L) 01/24/2023 0924   PLT 362 02/27/2023 0811   PLT 412 01/24/2023 0924   MCV 92.6 02/27/2023 0811   MCV 94 01/24/2023 0924   MCH 30.9 02/27/2023 0811   MCHC 33.3 02/27/2023 0811   RDW 16.1 (H) 02/27/2023 0811   RDW 15.0 01/24/2023 0924   LYMPHSABS 2.2 02/27/2023 0811   LYMPHSABS 5.3 (H) 08/30/2021 1659   MONOABS 0.8 02/27/2023 0811   EOSABS 0.1 02/27/2023 0811   EOSABS 0.2 08/30/2021 1659   BASOSABS 0.1 02/27/2023 0811   BASOSABS 0.1 08/30/2021 1659    BMET    Component Value Date/Time   NA 136 02/27/2023 0811   NA 136 01/24/2023 0924   K 4.2 02/27/2023 0811   CL 104 02/27/2023 0811   CO2 27 02/27/2023 0811   GLUCOSE 100 (H) 02/27/2023 0811   BUN 19 02/27/2023 0811   BUN 18 01/24/2023 0924   CREATININE 0.83 02/27/2023 0811   CREATININE 0.80 08/30/2012 0955   CALCIUM  9.2 02/27/2023 0811   GFRNONAA >60 02/27/2023 0811   GFRNONAA >89 08/30/2012 0955   GFRAA 106 11/21/2019 0847   GFRAA >89 08/30/2012 9044  BNP No results found for: BNP  ProBNP No results found for: PROBNP  Specialty Problems       Pulmonary Problems   HIATAL HERNIA   Qualifier: Diagnosis of  By: Drucilla BANANA, Pam   Replacing diagnoses that were inactivated after the 06/13/22 regulatory import      Lung mass   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)    Allergies  Allergen Reactions   Percocet [Oxycodone -Acetaminophen ] Itching    Immunization History  Administered Date(s) Administered   Fluad Quad(high Dose 65+) 01/18/2019, 02/01/2021   Fluad Trivalent(High Dose 65+) 12/29/2022   Influenza, High Dose Seasonal PF 01/10/2018   Influenza,inj,Quad PF,6+ Mos 12/11/2012, 01/21/2014, 02/17/2015, 12/23/2015, 01/06/2017   Moderna Sars-Covid-2 Vaccination 07/12/2019, 08/15/2019   Zoster, Live 03/12/2013    Past Medical History:   Diagnosis Date   Anxiety    Arthritis    Atrial fibrillation (HCC)    Chronic back pain    buldging disc   Chronic neck pain    ruptured disc   Cough    hx smoking   Dilated cardiomyopathy (HCC) 12/31/2022   TTE 12/27/2022: EF 40-45, global HK, normal RVSF, mildly elevated PASP, RVSP 42.4, mild LAE, moderate MR    Diverticulosis    Esophageal stricture    GERD (gastroesophageal reflux disease)    takes Prilosec daily   H/O hiatal hernia    Hearing loss    Hepatitis-C 2017   treated and cured per patient   Hiatal hernia    History of colon polyps    History of radiation therapy    Left Lung- 01/10/22-02/18/22- Dr. Lynwood Nasuti   Insomnia    takes Elavil  nightly   Lipoma of abdominal wall 02/11/2011   Lung cancer (HCC)    Stroke (HCC)    x 2   Type 2 diabetes mellitus (HCC) 07/22/2020   patient denies DM, per MD note pt is pre-diabetic    Tobacco History: Social History   Tobacco Use  Smoking Status Former   Current packs/day: 0.00   Average packs/day: 1 pack/day for 41.0 years (41.0 ttl pk-yrs)   Types: Cigarettes   Start date: 06/28/1961   Quit date: 06/29/2002   Years since quitting: 20.7  Smokeless Tobacco Never  Tobacco Comments   Former smoker (Pt quit smoking in 2004. 10/21/21) 12/26/22   Counseling given: Not Answered Tobacco comments: Former smoker (Pt quit smoking in 2004. 10/21/21) 12/26/22   Continue to not smoke  Outpatient Encounter Medications as of 03/22/2023  Medication Sig   amiodarone  (PACERONE ) 200 MG tablet Take 0.5 tablets (100 mg total) by mouth daily.   cyanocobalamin  (VITAMIN B12) 1000 MCG/ML injection Inject into the muscle every 30 (thirty) days.   ELIQUIS  5 MG TABS tablet TAKE ONE TABLET BY MOUTH TWICE DAILY   fluticasone  (FLONASE ) 50 MCG/ACT nasal spray Place 1 spray into the nose in the morning.   Fluticasone -Umeclidin-Vilant (TRELEGY ELLIPTA ) 100-62.5-25 MCG/ACT AEPB Inhale 1 puff into the lungs daily.   gabapentin  (NEURONTIN )  300 MG capsule Take 1 capsule (300 mg total) by mouth 2 (two) times daily.   HYDROcodone -acetaminophen  (NORCO) 10-325 MG tablet Take 1 tablet by mouth 3 (three) times daily.   metoprolol  succinate (TOPROL  XL) 25 MG 24 hr tablet Take 1 tablet (25 mg total) by mouth every evening.   Omega-3 Fatty Acids (FISH OIL ) 1000 MG CAPS Take 1,000 mg by mouth in the morning and at bedtime.   omeprazole  (PRILOSEC) 20 MG capsule  Take 1 capsule (20 mg total) by mouth daily.   pantoprazole  (PROTONIX ) 40 MG tablet TAKE ONE (1) TABLET BY MOUTH EVERY DAY   pravastatin  (PRAVACHOL ) 20 MG tablet TAKE ONE (1) TABLET BY MOUTH EVERY DAY   tamsulosin  (FLOMAX ) 0.4 MG CAPS capsule Take 1 capsule (0.4 mg total) by mouth daily.   No facility-administered encounter medications on file as of 03/22/2023.     Review of Systems  Review of Systems  No chest pain with exertion.  No orthopnea or PND.  Cough as discussed above, mildly productive.  Comprehensive review of systems otherwise negative. Physical Exam  BP 120/64 (BP Location: Left Arm, Patient Position: Sitting, Cuff Size: Normal)   Pulse 79   Ht 5' 10 (1.778 m)   Wt 134 lb (60.8 kg)   SpO2 98%   BMI 19.23 kg/m   Wt Readings from Last 5 Encounters:  03/22/23 134 lb (60.8 kg)  03/21/23 134 lb (60.8 kg)  03/17/23 133 lb 3.2 oz (60.4 kg)  03/06/23 140 lb 1.6 oz (63.5 kg)  02/17/23 147 lb (66.7 kg)    BMI Readings from Last 5 Encounters:  03/22/23 19.23 kg/m  03/21/23 19.23 kg/m  03/17/23 19.11 kg/m  03/06/23 20.10 kg/m  02/17/23 21.09 kg/m     Physical Exam General: Well-appearing, no acute distress Eyes: EOMI, no icterus Neck: Supple, no JVP Pulmonary: Clear, normal work of breathing, distant Cardiovascular: Warm, no edema Abdomen: Nondistended, bowel sounds present MSK: No synovitis, no joint effusion Neuro: Normal gait, no weakness Psych: Normal mood, full affect   Assessment & Plan:   Squamous cell carcinoma: 5 cm mass left lower  lobe.  Biopsied.  Status post chemoradiation.  Consolidation therapy with immunotherapy stopped due to pneumonitis.  Possible change on recent scan, PET scan pending.  Continue oncology follow-up.  Dyspnea on exertion: Since radiation therapy.  Possible abdominal restriction after radiation.  There are developing mild fibrotic changes on CT 12/24 around mass - related to radiation vs changes with malignancy. Also possible lung related smoking disease.  With cough this is favored.  Trial low-dose Trelegy 10/24.  Some improvement with this.  Encouraged to continue.  Handicap placard paperwork filled out today.  Excessive daytime sleepiness: Falling asleep driving sitting in chair etc.  Home sleep test ordered to evaluate for OSA. This was scheduled then he later decline/refused.  Currently denies a lot of pain, cough, difficulty swallowing.  No other clinical priority right now.  Reassess in the future if improvement.  Dysphagia: Chronic problem.  Will message medical team about endoscopic intervention in the future.  Return in about 3 months (around 06/20/2023).   Donnice JONELLE Beals, MD 03/22/2023  I spent 40 minutes in the care of the patient including review of records, face-to-face visit, coordination of care.

## 2023-03-23 ENCOUNTER — Telehealth: Payer: Self-pay | Admitting: Radiation Oncology

## 2023-03-23 ENCOUNTER — Encounter: Payer: Self-pay | Admitting: Physician Assistant

## 2023-03-23 ENCOUNTER — Inpatient Hospital Stay: Payer: Medicare Other | Attending: Internal Medicine | Admitting: Internal Medicine

## 2023-03-23 ENCOUNTER — Telehealth: Payer: Self-pay

## 2023-03-23 VITALS — BP 111/67 | HR 80 | Temp 98.2°F | Resp 16 | Ht 70.0 in | Wt 134.0 lb

## 2023-03-23 DIAGNOSIS — C3432 Malignant neoplasm of lower lobe, left bronchus or lung: Secondary | ICD-10-CM | POA: Diagnosis present

## 2023-03-23 DIAGNOSIS — C3412 Malignant neoplasm of upper lobe, left bronchus or lung: Secondary | ICD-10-CM | POA: Diagnosis not present

## 2023-03-23 DIAGNOSIS — C7951 Secondary malignant neoplasm of bone: Secondary | ICD-10-CM | POA: Diagnosis not present

## 2023-03-23 DIAGNOSIS — R1310 Dysphagia, unspecified: Secondary | ICD-10-CM | POA: Insufficient documentation

## 2023-03-23 NOTE — Telephone Encounter (Signed)
 Patient called to talk with someone to get scheduled for a consult appointment to see Dr. Roselind Messier. I told him someone from clerical would be calling him soon. Verified call back number.

## 2023-03-23 NOTE — Telephone Encounter (Signed)
 1/9 @ 11:07 am Left voicemail for patient to call our office to be schedule for follow up consult.

## 2023-03-23 NOTE — Progress Notes (Signed)
 [facility] [phone]   [phone]  OFFICE PROGRESS NOTE  Kristie Morene CROME, PA-C [address] [place] [address]  DIAGNOSIS:  Stage IIIB (T3, N2, M0) non-small cell lung cancer, squamous cell carcinoma presented with large left lower lobe lung mass in addition subcarinal and AP window nodal metastases. Diagnosed in September 2023.      PRIOR THERAPY:  1) Concurrent chemo/radiation with carboplatin  for an AUC of 2 and paclitaxel  45 mg/m2 weekly. First dose expected 01/10/22. Status post 7 cycles.  Last dose was giving February 15, 2022 with partial response. 2) Consolidation treatment with immunotherapy with Imfinzi  1500 Mg IV every 4 weeks.  First dose March 31, 2022.  Status post 6 cycles.  This treatment was discontinued secondary to suspicious immunotherapy mediated pneumonitis.   CURRENT THERAPY: Referral to radiation oncology for palliative radiotherapy.  INTERVAL HISTORY: Cameron Roman 73 y.o. male returns to the clinic today for follow-up visit. Discussed the use of AI scribe software for clinical note transcription with the patient, who gave verbal consent to proceed.  History of Present Illness   Cameron Deller, a 73 year old patient with a history of stage 3B non-small cell lung cancer (squamous cell carcinoma), initially diagnosed in September 2023, presents with persistent chest pain and difficulty swallowing. The patient underwent a course of chemotherapy and radiation, followed by consolidation immunotherapy with durvalumab . However, after six cycles of durvalumab , the treatment was discontinued due to the onset of shortness of breath and suspicion of pneumonitis.  Recently, the patient has been experiencing significant pain inside the chest, particularly on the left side. The pain is not localized but seems to be internal. Alongside this, the patient has been having difficulty swallowing, irrespective of the type of food or drink  consumed.  A recent PET scan revealed that the mass in the right lung is active, suggesting possible cancer growth in the same area where previous treatment was administered. This growth is suspected to be causing external compression of the esophagus, leading to the patient's swallowing difficulties, and potentially obstructing the airway, contributing to breathing issues.  The patient has expressed a strong aversion to undergoing further radiation treatment due to fears of exacerbating current symptoms and potential inability to eat or swallow. The patient has also expressed a willingness to discontinue treatment if the symptoms become too severe.       MEDICAL HISTORY: Past Medical History:  Diagnosis Date   Anxiety    Arthritis    Atrial fibrillation (HCC)    Chronic back pain    buldging disc   Chronic neck pain    ruptured disc   Cough    hx smoking   Dilated cardiomyopathy (HCC) 12/31/2022   TTE 12/27/2022: EF 40-45, global HK, normal RVSF, mildly elevated PASP, RVSP 42.4, mild LAE, moderate MR    Diverticulosis    Esophageal stricture    GERD (gastroesophageal reflux disease)    takes Prilosec daily   H/O hiatal hernia    Hearing loss    Hepatitis-C 2017   treated and cured per patient   Hiatal hernia    History of colon polyps    History of radiation therapy    Left Lung- 01/10/22-02/18/22- Dr. Lynwood Nasuti   Insomnia    takes Elavil  nightly   Lipoma of abdominal wall 02/11/2011   Lung cancer (HCC)    Stroke (HCC)    x 2   Type 2 diabetes mellitus (HCC) 07/22/2020  patient denies DM, per MD note pt is pre-diabetic    ALLERGIES:  is allergic to percocet [oxycodone -acetaminophen ].  MEDICATIONS:  Current Outpatient Medications  Medication Sig Dispense Refill   amiodarone  (PACERONE ) 200 MG tablet Take 0.5 tablets (100 mg total) by mouth daily.     cyanocobalamin  (VITAMIN B12) 1000 MCG/ML injection Inject into the muscle every 30 (thirty) days.     ELIQUIS  5  MG TABS tablet TAKE ONE TABLET BY MOUTH TWICE DAILY 60 tablet 5   fluticasone  (FLONASE ) 50 MCG/ACT nasal spray Place 1 spray into the nose in the morning.     Fluticasone -Umeclidin-Vilant (TRELEGY ELLIPTA ) 100-62.5-25 MCG/ACT AEPB Inhale 1 puff into the lungs daily. 60 each 11   gabapentin  (NEURONTIN ) 300 MG capsule Take 1 capsule (300 mg total) by mouth 2 (two) times daily.     HYDROcodone -acetaminophen  (NORCO) 10-325 MG tablet Take 1 tablet by mouth 3 (three) times daily.     metoprolol  succinate (TOPROL  XL) 25 MG 24 hr tablet Take 1 tablet (25 mg total) by mouth every evening. 90 tablet 3   Omega-3 Fatty Acids (FISH OIL ) 1000 MG CAPS Take 1,000 mg by mouth in the morning and at bedtime.     omeprazole  (PRILOSEC) 20 MG capsule Take 1 capsule (20 mg total) by mouth daily. 90 capsule 3   pantoprazole  (PROTONIX ) 40 MG tablet TAKE ONE (1) TABLET BY MOUTH EVERY DAY 90 tablet 3   pravastatin  (PRAVACHOL ) 20 MG tablet TAKE ONE (1) TABLET BY MOUTH EVERY DAY 30 tablet 0   tamsulosin  (FLOMAX ) 0.4 MG CAPS capsule Take 1 capsule (0.4 mg total) by mouth daily. 90 capsule 3   No current facility-administered medications for this visit.    SURGICAL HISTORY:  Past Surgical History:  Procedure Laterality Date   ANTERIOR CERVICAL DECOMP/DISCECTOMY FUSION  04/12/2011   Procedure: ANTERIOR CERVICAL DECOMPRESSION/DISCECTOMY FUSION 2 LEVELS;  Surgeon: Fairy JONETTA Levels, MD;  Location: [facility] NEURO ORS;  Service: Neurosurgery;  Laterality: N/A;  exploration of Cervical four - seven  fusion with redo Cervical six- seven, cervical three- four anterior cervical decompression with fusion interbody prothesis plating and bonegraft and C34 anterior cervical decompression with inte   APPENDECTOMY     at age 52   ATRIAL FIBRILLATION ABLATION N/A 02/17/2023   Procedure: ATRIAL FIBRILLATION ABLATION;  Surgeon: Kennyth Chew, MD;  Location: [facility] INVASIVE CV LAB;  Service: Cardiovascular;  Laterality: N/A;   BRONCHIAL BIOPSY   11/30/2021   Procedure: BRONCHIAL BIOPSIES;  Surgeon: Brenna Adine CROME, DO;  Location: [facility] ENDOSCOPY;  Service: Pulmonary;;   BRONCHIAL BRUSHINGS  11/30/2021   Procedure: BRONCHIAL BRUSHINGS;  Surgeon: Brenna Adine CROME, DO;  Location: [facility] ENDOSCOPY;  Service: Pulmonary;;   CARDIAC CATHETERIZATION  06/03/2004   CARDIOVERSION N/A 12/28/2022   Procedure: CARDIOVERSION;  Surgeon: Sheena Pugh, DO;  Location: [facility] INVASIVE CV LAB;  Service: Cardiovascular;  Laterality: N/A;   COLONOSCOPY     EP IMPLANTABLE DEVICE N/A 09/14/2015   Procedure: Loop Recorder Insertion;  Surgeon: Danelle LELON Birmingham, MD;  Location: [facility] INVASIVE CV LAB;  Service: Cardiovascular;  Laterality: N/A;   FINE NEEDLE ASPIRATION  11/30/2021   Procedure: FINE NEEDLE ASPIRATION (FNA) LINEAR;  Surgeon: Brenna Adine CROME, DO;  Location: [facility] ENDOSCOPY;  Service: Pulmonary;;   HAND SURGERY Right 03/15/2003   right x 3   INNER EAR SURGERY Bilateral    bil;titanium in both ears   lower back surgery  03/15/2007   had plates and screws inserted   NECK SURGERY  03/15/2007   had insertion of  plates and screws   TEE WITHOUT CARDIOVERSION N/A 09/14/2015   Procedure: TRANSESOPHAGEAL ECHOCARDIOGRAM (TEE);  Surgeon: Aleene JINNY Passe, MD;  Location: [facility] ENDOSCOPY;  Service: Cardiovascular;  Laterality: N/A;   TRANSFORAMINAL LUMBAR INTERBODY FUSION (TLIF) WITH PEDICLE SCREW FIXATION 1 LEVEL Left 09/27/2019   Procedure: Left Lumbar 5 Sacral 1 Transforaminal lumbar interbody fusion with exploration/removal of adjacent level hardware;  Surgeon: Unice Pac, MD;  Location: [facility] OR;  Service: Neurosurgery;  Laterality: Left;  3C/RM 19   VIDEO BRONCHOSCOPY WITH ENDOBRONCHIAL ULTRASOUND N/A 11/30/2021   Procedure: VIDEO BRONCHOSCOPY WITH ENDOBRONCHIAL ULTRASOUND;  Surgeon: Brenna Adine CROME, DO;  Location: [facility] ENDOSCOPY;  Service: Pulmonary;  Laterality: N/A;    REVIEW OF SYSTEMS:  Constitutional: positive for fatigue Eyes: negative Ears, nose, mouth, throat,  and face: negative Respiratory: positive for dyspnea on exertion and pleurisy/chest pain Cardiovascular: negative Gastrointestinal: positive for dyspepsia and dysphagia Genitourinary:negative Integument/breast: negative Hematologic/lymphatic: negative Musculoskeletal:negative Neurological: negative Behavioral/Psych: negative Endocrine: negative Allergic/Immunologic: negative   PHYSICAL EXAMINATION: General appearance: alert, cooperative, fatigued, and no distress Head: Normocephalic, without obvious abnormality, atraumatic Neck: no adenopathy, no JVD, supple, symmetrical, trachea midline, and thyroid  not enlarged, symmetric, no tenderness/mass/nodules Lymph nodes: Cervical, supraclavicular, and axillary nodes normal. Resp: clear to auscultation bilaterally Back: symmetric, no curvature. ROM normal. No CVA tenderness. Cardio: regular rate and rhythm, S1, S2 normal, no murmur, click, rub or gallop GI: soft, non-tender; bowel sounds normal; no masses,  no organomegaly Extremities: extremities normal, atraumatic, no cyanosis or edema Neurologic: Alert and oriented X 3, normal strength and tone. Normal symmetric reflexes. Normal coordination and gait  ECOG PERFORMANCE STATUS: 1 - Symptomatic but completely ambulatory  Blood pressure 111/67, pulse 80, temperature 98.2 F (36.8 C), temperature source Temporal, resp. rate 16, height 5' 10 (1.778 m), weight 134 lb (60.8 kg), SpO2 97%.  LABORATORY DATA: Lab Results  Component Value Date   WBC 10.3 02/27/2023   HGB 10.4 (L) 02/27/2023   HCT 31.2 (L) 02/27/2023   MCV 92.6 02/27/2023   PLT 362 02/27/2023      Chemistry      Component Value Date/Time   NA 136 02/27/2023 0811   NA 136 01/24/2023 0924   K 4.2 02/27/2023 0811   CL 104 02/27/2023 0811   CO2 27 02/27/2023 0811   BUN 19 02/27/2023 0811   BUN 18 01/24/2023 0924   CREATININE 0.83 02/27/2023 0811   CREATININE 0.80 08/30/2012 0955      Component Value Date/Time    CALCIUM  9.2 02/27/2023 0811   ALKPHOS 62 02/27/2023 0811   AST 11 (L) 02/27/2023 0811   ALT 6 02/27/2023 0811   BILITOT 0.3 02/27/2023 0811       RADIOGRAPHIC STUDIES: CT Chest W Contrast Result Date: 03/04/2023 CLINICAL DATA:  Non-small cell lung cancer (NSCLC), staging, completed chemotherapy and radiation therapy. Dyspnea and mid chest pain for 2 months. Weight loss. Restaging. * Tracking Code: BO * EXAM: CT CHEST WITH CONTRAST TECHNIQUE: Multidetector CT imaging of the chest was performed during intravenous contrast administration. RADIATION DOSE REDUCTION: This exam was performed according to the departmental dose-optimization program which includes automated exposure control, adjustment of the mA and/or kV according to patient size and/or use of iterative reconstruction technique. CONTRAST:  75mL OMNIPAQUE  IOHEXOL  300 MG/ML  SOLN COMPARISON:  02/06/2023 coronary CT.  11/17/2022 chest CT. FINDINGS: Cardiovascular: Normal heart size. Trace pericardial effusion is new. Three-vessel coronary atherosclerosis. Atherosclerotic nonaneurysmal thoracic aorta. Normal caliber pulmonary arteries.  No central pulmonary emboli. Mediastinum/Nodes: No significant thyroid  nodules. Unremarkable esophagus. No axillary adenopathy. Mildly enlarged 1.1 cm subcarinal node (series 2/image 89), previously 1.0 cm on 11/17/2022 chest CT, not substantially changed. Mildly enlarged 1.1 cm left paratracheal node (series 2/image 73), previously 0.9 cm, mildly increased. No right hilar adenopathy. Lungs/Pleura: No pneumothorax. No pleural effusion. Severe centrilobular emphysema with diffuse bronchial wall thickening. Saber sheath trachea. Enlarging central left lower lobe 4.9 x 4.8 cm solid lung mass contiguous with the posterior left hilum (series 2/image 88), previously 4.0 x 3.2 cm. Mild patchy linear left parahilar opacities are minimally increased favoring evolving postradiation change. No additional significant pulmonary  nodules. Upper abdomen: No acute abnormality. Musculoskeletal: No aggressive appearing focal osseous lesions. Mild thoracic spondylosis. Partially visualized surgical hardware from ACDF in the lower cervical spine. IMPRESSION: 1. Enlarging central left lower lobe 4.9 cm solid lung mass contiguous with the posterior left hilum, suspicious for progressive local tumor recurrence. Consider PET-CT for further evaluation. 2. Newly mildly enlarged left paratracheal lymph node and stable mildly enlarged subcarinal lymph node, suspicious for nodal metastases. 3. Trace pericardial effusion is new. 4. Three-vessel coronary atherosclerosis. 5. Aortic Atherosclerosis (ICD10-I70.0) and Emphysema (ICD10-J43.9). Electronically Signed   By: Selinda DELENA Blue M.D.   On: 03/04/2023 12:04    ASSESSMENT AND PLAN:  This is a very pleasant 73 years old white male diagnosed with a stage IIIb (T3, N2, M0) non-small cell lung cancer, squamous cell carcinoma presented with large left lower lobe lung mass in addition to subcarinal and AP window lymphadenopathy diagnosed in September 2023.  The patient underwent a course of concurrent chemoradiation with weekly carboplatin  and paclitaxel  status post 7 cycles of the chemotherapy.  He tolerated this treatment well with no concerning adverse effects except for the radiation-induced esophagitis and odynophagia.  He is recovering well from his treatment. He is currently undergoing a course of consolidation treatment with immunotherapy with Imfinzi  1500 Mg IV every 4 weeks status post 6 cycles.   The patient has been tolerating this treatment well but has been complaining of increasing fatigue and weakness as well as weight loss and worsening dyspnea at baseline increased with exertion.  He had repeat CT scan of the chest performed recently.  I personally and independently reviewed the scan images and discussed the results with the patient today.  His scan showed interval increase in size of  posterior perihilar soft tissue mass within the left lung concerning for disease.  Recurrence but he also has a lot of postradiation changes in that area concerning for interstitial lung disease or hypersensitivity pneumonitis.  His treatment was discontinued. He was treated with a tapered dose of prednisone  for the next 6 weeks.  The patient is currently on observation and he is feeling fine except for the cough as well as the left-sided chest pain. He had repeat CT scan of the chest performed recently.  I personally and independently reviewed the scan images and discussed the result with the patient today.  The scan showed increase in the left lower lobe lung mass as well as left paratracheal lymphadenopathy suspicious for disease progression. The patient had a PET scan performed few days ago but the final report is still pending.  I personally and independently reviewed the images and I see clear evidence for disease recurrence and progression in the left lung mass but other areas of metastasis could not be excluded at this point and I will wait for the final report for confirmation.  Stage III B Non-Small Cell Lung Cancer (NSCLC) - Squamous Cell Carcinoma 73 year old with stage III B NSCLC squamous cell carcinoma, initially treated with chemotherapy, radiation, and durvalumab . Immunotherapy was discontinued after six cycles due to suspected pneumonitis. Recent PET scan suggests probable recurrence with esophageal compression causing dysphagia and airway obstruction causing dyspnea. Significant left-sided chest pain reported. Discussed radiation, chemotherapy (carboplatin  and paclitaxel ), and hospice care. Patient prefers to avoid radiation unless PET scan shows no further spread. Risks of radiation include worsening dysphagia and potential inability to eat. Chemotherapy discussed to address primary tumor and potential metastasis. Hospice care discussed with an estimated prognosis of six months without  treatment. - Refer to radiation oncology for a short course of radiation therapy (approximately three weeks) to relieve esophageal compression and airway obstruction - Await PET scan report to determine the extent of cancer spread - Consider chemotherapy with carboplatin  and paclitaxel  if PET scan indicates further spread - Arrange follow-up appointment in one month to assess response to radiation and determine the need for additional chemotherapy  Dysphagia Likely due to external compression of the esophagus by the recurrent lung mass. - Address esophageal compression through radiation therapy as part of the lung cancer treatment plan - Monitor swallowing function and nutritional status  Pneumonitis Suspected secondary to durvalumab , leading to discontinuation of immunotherapy. Under observation since then. - Continue observation for pneumonitis symptoms - Monitor respiratory status closely  Follow-up - Schedule follow-up appointment in one month - Review PET scan report and communicate findings - Coordinate care with radiation oncology and medical oncology teams.   The patient was advised to call immediately if he has any concerning symptoms in the interval. The patient voices understanding of current disease status and treatment options and is in agreement with the current care plan.  All questions were answered. The patient knows to call the clinic with any problems, questions or concerns. We can certainly see the patient much sooner if necessary.  The total time spent in the appointment was 30 minutes.  Disclaimer: This note was dictated with voice recognition software. Similar sounding words can inadvertently be transcribed and may not be corrected upon review.

## 2023-03-24 ENCOUNTER — Other Ambulatory Visit: Payer: Self-pay | Admitting: *Deleted

## 2023-03-24 DIAGNOSIS — R1319 Other dysphagia: Secondary | ICD-10-CM

## 2023-03-24 NOTE — Telephone Encounter (Signed)
 Barium esophagram scheduled for 04/04/23 @ 11 am. Left message for patient to call office.

## 2023-03-27 ENCOUNTER — Institutional Professional Consult (permissible substitution): Payer: Medicare Other | Admitting: Radiation Oncology

## 2023-03-27 ENCOUNTER — Ambulatory Visit: Payer: Medicare Other

## 2023-03-27 ENCOUNTER — Ambulatory Visit
Admission: RE | Admit: 2023-03-27 | Discharge: 2023-03-27 | Disposition: A | Discharge status: Home / Self Care | Payer: Medicare Other | Source: Ambulatory Visit | Attending: Radiation Oncology | Admitting: Radiation Oncology

## 2023-03-28 ENCOUNTER — Encounter: Payer: Self-pay | Admitting: *Deleted

## 2023-03-28 ENCOUNTER — Encounter: Payer: Self-pay | Admitting: Physician Assistant

## 2023-03-28 ENCOUNTER — Telehealth: Payer: Self-pay | Admitting: Internal Medicine

## 2023-03-28 NOTE — Telephone Encounter (Signed)
 Patient informed.

## 2023-03-28 NOTE — Telephone Encounter (Signed)
 Pt c/o medication issue:  1. Name of Medication: amiodarone  (PACERONE ) 200 MG tablet   2. How are you currently taking this medication (dosage and times per day)?  Take 0.5 tablets (100 mg total) by mouth daily.       3. Are you having a reaction (difficulty breathing--STAT)? No  4. What is your medication issue? Pt is requesting a callback regarding this medication not working for him. Please advise

## 2023-03-28 NOTE — Telephone Encounter (Signed)
 Spoke with pt who states that he was told by oncology that he has six months to live if he does not start radiation and chemo. Pt would like to know why he abd is hurting when he eats. Pt states that they will not scope him unless he comes off the Eliquis  for 2-3 days. Pt states that he will  sign whatever he needs to, to have this done. Encouraged pt not to stop Eliquis  d/t having recent ablation. Pt states that he will have to be seen in the ER d/t not being able to eat. Pt states that he has lost from 148 lbs to 134 lbs. Informed pt that I would send a msg to Dr. Waddell regarding this matter. Please advise.

## 2023-03-28 NOTE — Telephone Encounter (Signed)
 Per Shanda Bumps, [place] if patient is in pain then she recommends he go to the emergency room for further work up.

## 2023-03-28 NOTE — Telephone Encounter (Signed)
 Patient called office stating he is in severe pain and was up most of the night with his stomach. Informed patient per Dr. Abran since we are unable to hold his Eliquis  for 90 day we will proceed with the barium esophagram that is scheduled for 04/04/23. Informed patient will talk with Harlene, [place] about further recommendations.

## 2023-03-29 ENCOUNTER — Ambulatory Visit: Payer: Medicare Other

## 2023-03-29 ENCOUNTER — Ambulatory Visit: Admission: RE | Admit: 2023-03-29 | Payer: Medicare Other | Source: Ambulatory Visit | Admitting: Radiation Oncology

## 2023-03-29 ENCOUNTER — Ambulatory Visit
Admission: RE | Admit: 2023-03-29 | Discharge: 2023-03-29 | Disposition: A | Discharge status: Home / Self Care | Payer: Medicare Other | Source: Ambulatory Visit | Attending: Radiation Oncology | Admitting: Radiation Oncology

## 2023-03-29 ENCOUNTER — Encounter: Payer: Self-pay | Admitting: Radiation Oncology

## 2023-03-29 VITALS — BP 116/73 | HR 80 | Temp 97.5°F | Resp 20 | Ht 70.0 in | Wt 131.2 lb

## 2023-03-29 DIAGNOSIS — I700 Atherosclerosis of aorta: Secondary | ICD-10-CM | POA: Insufficient documentation

## 2023-03-29 DIAGNOSIS — I4891 Unspecified atrial fibrillation: Secondary | ICD-10-CM | POA: Diagnosis not present

## 2023-03-29 DIAGNOSIS — Z860100 Personal history of colon polyps, unspecified: Secondary | ICD-10-CM | POA: Diagnosis not present

## 2023-03-29 DIAGNOSIS — K219 Gastro-esophageal reflux disease without esophagitis: Secondary | ICD-10-CM | POA: Insufficient documentation

## 2023-03-29 DIAGNOSIS — G8929 Other chronic pain: Secondary | ICD-10-CM | POA: Insufficient documentation

## 2023-03-29 DIAGNOSIS — Z79899 Other long term (current) drug therapy: Secondary | ICD-10-CM | POA: Insufficient documentation

## 2023-03-29 DIAGNOSIS — C3412 Malignant neoplasm of upper lobe, left bronchus or lung: Secondary | ICD-10-CM | POA: Diagnosis not present

## 2023-03-29 DIAGNOSIS — J432 Centrilobular emphysema: Secondary | ICD-10-CM | POA: Diagnosis not present

## 2023-03-29 DIAGNOSIS — R079 Chest pain, unspecified: Secondary | ICD-10-CM | POA: Insufficient documentation

## 2023-03-29 DIAGNOSIS — Z800 Family history of malignant neoplasm of digestive organs: Secondary | ICD-10-CM | POA: Insufficient documentation

## 2023-03-29 DIAGNOSIS — I420 Dilated cardiomyopathy: Secondary | ICD-10-CM | POA: Insufficient documentation

## 2023-03-29 DIAGNOSIS — C7802 Secondary malignant neoplasm of left lung: Secondary | ICD-10-CM

## 2023-03-29 DIAGNOSIS — I3139 Other pericardial effusion (noninflammatory): Secondary | ICD-10-CM | POA: Insufficient documentation

## 2023-03-29 DIAGNOSIS — K222 Esophageal obstruction: Secondary | ICD-10-CM | POA: Diagnosis not present

## 2023-03-29 DIAGNOSIS — Z8673 Personal history of transient ischemic attack (TIA), and cerebral infarction without residual deficits: Secondary | ICD-10-CM | POA: Diagnosis not present

## 2023-03-29 DIAGNOSIS — Z87891 Personal history of nicotine dependence: Secondary | ICD-10-CM | POA: Insufficient documentation

## 2023-03-29 DIAGNOSIS — Z923 Personal history of irradiation: Secondary | ICD-10-CM | POA: Insufficient documentation

## 2023-03-29 DIAGNOSIS — Z7901 Long term (current) use of anticoagulants: Secondary | ICD-10-CM | POA: Diagnosis not present

## 2023-03-29 DIAGNOSIS — I2510 Atherosclerotic heart disease of native coronary artery without angina pectoris: Secondary | ICD-10-CM | POA: Diagnosis not present

## 2023-03-29 NOTE — Progress Notes (Signed)
 Location of tumor and Histology per Pathology Report: Left lower lobe  Biopsy: None  Past/Anticipated interventions by surgeon, if any: None at this time  Past/Anticipated interventions by medical oncology, if any: Dr. Marguerita Shih     Pain issues, if any:  yes constant to left chest and back. Patient reports having a fall on Monday, patient thinks he may have slipped a disc when he fell.   Cough/Shortness of breath: Productive cough and shortness of breath   SAFETY ISSUES: Prior radiation? yes Pacemaker/ICD? no Possible current pregnancy? no Is the patient on methotrexate? no  Current Complaints / other details:       BP 116/73 (BP Location: Left Arm, Patient Position: Sitting)   Pulse 80   Temp (!) 97.5 F (36.4 C) (Temporal)   Resp 20   Ht 5\' 10"  (1.778 m)   Wt 131 lb 4 oz (59.5 kg)   SpO2 98%   BMI 18.83 kg/m

## 2023-03-29 NOTE — Progress Notes (Signed)
 Radiation Oncology         [phone] ________________________________   Outpatient Re-Consultation   Name: Cameron Roman   MRN: [id number]         Date: 03/27/2023                      DOB: 01/14/1951   WU:JWJX, Bary Likes, PA-C  Marlene Simas, MD    REFERRING PHYSICIAN: Marlene Simas, MD   DIAGNOSIS: There were no encounter diagnoses.   Stage IIIA (T3, N2, M0) non-small cell lung cancer, squamous cell carcinoma presented with large left lower lobe lung mass in addition to left hilar and suspicious subcarinal lymphadenopathy diagnosed in September of 2023, s/p concurrent chemoradiation followed by consolidation immunotherapy. Now with an enlarging LLL mass, consistent with residual or recurrent lung carcinoma   Interval Since Last Radiation: 1 year, 1 month, and 5 days    Indication for treatment: Curative      Radiation treatment dates: 01/10/22 through 02/18/22 Site/dose: 56 Gy delivered in 28 Fx at 2.00 Gy/Fx (Originally planned for 60 Gy delivered in 30 Fx at 2.00 Gy/Fx, however, the patient opted to forgo his last 2 treatment sessions secondary to feeling so poorly)   Beams/energy:  6X, 10X   HISTORY OF PRESENT ILLNESS: Cameron Roman is a 73 y.o. male who returns to day for consideration of further radiation therapy in management of an enlarging lung mass. He was last seen here on 03/28/22 for a routine follow-up visit after completing concurrent chemoradiation to the left lung. This was followed by consolidation immunotherapy with durvalumab  under Dr. Marguerita Shih. Durvalumab  was ultimately discontinued after 6 cycles due to new onset SOB and concern for PNA.    He has continued with surveillance imaging since that time and presented for a restaging chest CT on 06/17/22 which demonstrated no evidence of recurrent or metastatic disease, and evolving post radiation changes in the left hemithorax with trace left pleural fluid.    His next surveillance scan on 10/02/22 however showed  an interval increase in size of the posterior soft tissue mass within the left lung, concerning for recurrent disease, and interval development of extensive predominantly lower lung zone interstitial reticulation with patchy ground-glass attenuation concerning for interstitial lung disease vs hypersensitivity pneumonitis secondary to chemotherapy. An indeterminate small enhancing lesion within the inferior right lobe of liver measuring 1.7 cm was also demonstrated. In light of this finding, he presented for an abdominal US  on 10/27/22 which showed no findings to correlate for the hepatic lesion seen on his chest CT. Evidence of liver cirrhosis was however demonstrated.    Dr. Marguerita Shih reviewed these CT findings with the patient, and he opted to proceed with interval repeat imaging. Subsequent repeat chest CT on 11/17/22 demonstrated stability of the left-sided lower lobe perihilar soft tissue mass, with thickening at the left hilum, and no new lesions or evidence of lymphadenopathy. To further evaluate for the hepatic lesion seen on his prior CT, he also presented for an abdominal MRI on 11/21/22 which showed a subtle area of nodular enhancement without aggressive features in segment 6 of the liver, correlating with the previously demonstrated area of concern, and thus favoring a benign lesion.    In the setting of a history of a-fib, the patient presented for a cardiac scoring chest CT on 02/06/23 which was rather unremarkable from a cardiac standpoint. However, the left lung mass was redemonstrated and an over-read interpretation of the chest was ordered which showed an  interval increase in size of the LLL/perihilar mass, measuring 4.6 cm, previously measuring 4.0 cm on his chest CT from September. Debris within the distal left mainstem bronchus extending into the LLL bronchus were also demonstrated, and were favored to be reflective of secretions/aspiration rather than tumor invasion.    Around the same time  as this study, the patient also presented to Dr. Marguerita Shih with c/o progressive weight loss, dyspnea, and chest pain for 2 months. In this setting, he presented for an additional chest CT with contrast on 02/27/23 which demonstrated a further interval increase in size of the LLL mass, measuring 4.9 cm, contiguous with the posterior left hilum, suspicious for progressive local tumor recurrence. Evidence of a new enlarged paratracheal lymph node and stable mildly enlarged subcarinal lymph node were also demonstrated, suspicious for nodal metastases, as well as a new trace pericardial effusion.     He also contacted Dr. Bing Buff office on 03/14/23 with reports of hemoptysis. He was promptly instructed to present to the ED, though it appears that he did not end up doing soon.    Due to his progressive symptoms and imaging findings, a restaging PET scan was performed on 03/16/23 which demonstrated hypermetabolism associated with the enlarging LLL mass, consistent with residual or recurrent lung carcinoma. PET otherwise showed no evidence of metastatic adenopathy in the chest or evidence of distant metastatic disease. However, indeterminate asymmetric hypermetabolic activity at the level of the right lingual tonsil was demonstrated, which warrants further evaluation.    During his most recent follow-up visit with Dr. Marguerita Shih on 03/22/22, the patient endorsed significant chest pain, left greater than right, and more internal. He also endorsed difficulty swallowing, irrespective to the type of food or drink consumed. Dr. Marguerita Shih suspects that the enlarging LLL mass may be causing external compression of the esophagus, leading to the patient's swallowing difficulties, and potentially obstructing the airway, also contributing to his  issues.    The patient has expressed to Dr Marguerita Shih that he has a strong aversion to undergoing further radiation treatment due to fears of exacerbating current symptoms and potential  inability to eat or swallow. He has also expressed a willingness to discontinue treatment if the symptoms become too severe. However, due to his symptoms, Dr. Marguerita Shih has urged him to consider a short course of radiation therapy (approximately three weeks) to help relieve his perceived esophageal compression and airway obstruction. Based on PET findings showing no definite evidence of further disease spread, there is likely no indication for chemotherapy based on Dr. Bing Buff recommendations.   Today patient reports constant chest pain that radiates to his abdomen. This pain is worse with eating. He states the pain keeps him from eating. He also endorses a productive cough along with shortness of breath.      PAST MEDICAL HISTORY:      Past Medical History:  Diagnosis Date   Anxiety     Arthritis     Atrial fibrillation (HCC)     Chronic back pain      buldging disc   Chronic neck pain      ruptured disc   Cough      hx smoking   Dilated cardiomyopathy (HCC) 12/31/2022    TTE 12/27/2022: EF 40-45, global HK, normal RVSF, mildly elevated PASP, RVSP 42.4, mild LAE, moderate MR     Diverticulosis     Esophageal stricture     GERD (gastroesophageal reflux disease)      takes Prilosec daily   H/O  hiatal hernia     Hearing loss     Hepatitis-C 2017    treated and cured per patient   Hiatal hernia     History of colon polyps     History of radiation therapy      Left Lung- 01/10/22-02/18/22- Dr. Retta Caster   Insomnia      takes Elavil  nightly   Lipoma of abdominal wall 02/11/2011   Lung cancer (HCC)     Stroke (HCC)      x 2   Type 2 diabetes mellitus (HCC) 07/22/2020    patient denies DM, per MD note pt is pre-diabetic          PAST SURGICAL HISTORY:      Past Surgical History:  Procedure Laterality Date   ANTERIOR CERVICAL DECOMP/DISCECTOMY FUSION   04/12/2011    Procedure: ANTERIOR CERVICAL DECOMPRESSION/DISCECTOMY FUSION 2 LEVELS;  Surgeon: Lei Pump, MD;   Location: [facility];  Service: Neurosurgery;  Laterality: N/A;  exploration of Cervical four - seven  fusion with redo Cervical six- seven, cervical three- four anterior cervical decompression with fusion interbody prothesis plating and bonegraft and C34 anterior cervical decompression with inte   APPENDECTOMY        at age 19   ATRIAL FIBRILLATION ABLATION N/A 02/17/2023    Procedure: ATRIAL FIBRILLATION ABLATION;  Surgeon: Ardeen Kohler, MD;  Location: [facility] INVASIVE CV LAB;  Service: Cardiovascular;  Laterality: N/A;   BRONCHIAL BIOPSY   11/30/2021    Procedure: BRONCHIAL BIOPSIES;  Surgeon: Prudy Brownie, DO;  Location: [facility] ENDOSCOPY;  Service: Pulmonary;;   BRONCHIAL BRUSHINGS   11/30/2021    Procedure: BRONCHIAL BRUSHINGS;  Surgeon: Prudy Brownie, DO;  Location: [facility] ENDOSCOPY;  Service: Pulmonary;;   CARDIAC CATHETERIZATION   06/03/2004   CARDIOVERSION N/A 12/28/2022    Procedure: CARDIOVERSION;  Surgeon: Jerryl Morin, DO;  Location: [facility] INVASIVE CV LAB;  Service: Cardiovascular;  Laterality: N/A;   COLONOSCOPY       EP IMPLANTABLE DEVICE N/A 09/14/2015    Procedure: Loop Recorder Insertion;  Surgeon: Tammie Fall, MD;  Location: [facility] INVAS[facility] CV LAB;  Service: Cardiovascular;  Laterality: N/A;   FINE NEEDLE ASPIRATION   11/30/2021    Procedure: FINE NEEDLE ASPIRATION (FNA) LINEAR;  Surgeon: Prudy Brownie, DO;  Location: [facility] ENDOS[facility];  Service: Pulmonary;;   HAND SURGERY Right 03/15/2003    right x 3   INNER EAR SURGERY Bilateral      bil;titanium in both ears   lower back surgery   03/15/2007    had plates and screws inserted   NECK SURGERY   03/15/2007    had insertion of  plates and screws   TEE WITHOUT CARDIOVERSION N/A 09/14/2015    Procedure: TRANSESOPHAGEAL ECHOCARDIOGRAM (TEE);  Surgeon: Lake Pilgrim, MD;  Location: [facility] ENDOSCOPY;  Service: Cardiovascular;  Laterality: N/A;   TRANSFORAMINAL LUMBAR INTERBODY FUSION (TLIF) WITH PEDICLE SCREW FIXATION 1 LEVEL Left  09/27/2019    Procedure: Left Lumbar 5 Sacral 1 Transforaminal lumbar interbody fusion with exploration/removal of adjacent level hardware;  Surgeon: Manya Sells, MD;  Location: [facility] OR;  Service: Neurosurgery;  Laterality: Left;  3C/RM 19   VIDEO BRONCHOSCOPY WITH ENDOBRONCHIAL ULTRASOUND N/A 11/30/2021    Procedure: VIDEO BRONCHOSCOPY WITH ENDOBRONCHIAL ULTRASOUND;  Surgeon: Prudy Brownie, DO;  Location: [facility] ENDOSCOPY;  Service: Pulmonary;  Laterality: N/A;          FAMILY HISTORY:       Family History  Problem Relation Age of Onset   Other Mother          Perforated bowel   Cancer Father     Colon cancer Father     Cancer Brother          GI   Colon cancer Maternal Grandmother     Colon cancer Maternal Grandfather     Healthy Daughter     Healthy Son     Heart disease Maternal Uncle     Stroke Maternal Uncle     Anesthesia problems Neg Hx     Hypotension Neg Hx     Malignant hyperthermia Neg Hx     Pseudochol deficiency Neg Hx            SOCIAL HISTORY:  Social History  Social History         Tobacco Use   Smoking status: Former      Current packs/day: 0.00      Average packs/day: 1 pack/day for 41.0 years (41.0 ttl pk-yrs)      Types: Cigarettes      Start date: 06/28/1961      Quit date: 06/29/2002      Years since quitting: 20.7   Smokeless tobacco: Never   Tobacco comments:      Former smoker (Pt quit smoking in 2004. 10/21/21) 12/26/22  Vaping Use   Vaping status: Never Used  Substance Use Topics   Alcohol use: Not Currently      Alcohol/week: 1.0 standard drink of alcohol      Types: 1 Cans of beer per week   Drug use: No        ALLERGIES:  Allergies      Allergies  Allergen Reactions   Percocet [Oxycodone -Acetaminophen ] Itching        MEDICATIONS:        Current Outpatient Medications  Medication Sig Dispense Refill   amiodarone  (PACERONE ) 200 MG tablet Take 0.5 tablets (100 mg total) by mouth daily.       cyanocobalamin  (VITAMIN B12)  1000 MCG/ML injection Inject into the muscle every 30 (thirty) days.       ELIQUIS  5 MG TABS tablet TAKE ONE TABLET BY MOUTH TWICE DAILY 60 tablet 5   fluticasone  (FLONASE ) 50 MCG/ACT nasal spray Place 1 spray into the nose in the morning.       Fluticasone -Umeclidin-Vilant (TRELEGY ELLIPTA ) 100-62.5-25 MCG/ACT AEPB Inhale 1 puff into the lungs daily. 60 each 11   gabapentin  (NEURONTIN ) 300 MG capsule Take 1 capsule (300 mg total) by mouth 2 (two) times daily.       HYDROcodone -acetaminophen  (NORCO) 10-325 MG tablet Take 1 tablet by mouth 3 (three) times daily.       metoprolol  succinate (TOPROL  XL) 25 MG 24 hr tablet Take 1 tablet (25 mg total) by mouth every evening. 90 tablet 3   Omega-3 Fatty Acids (FISH OIL ) 1000 MG CAPS Take 1,000 mg by mouth in the morning and at bedtime.       omeprazole  (PRILOSEC) 20 MG capsule Take 1 capsule (20 mg total) by mouth daily. 90 capsule 3   pantoprazole  (PROTONIX ) 40 MG tablet TAKE ONE (1) TABLET BY MOUTH EVERY DAY 90 tablet 3   pravastatin  (PRAVACHOL ) 20 MG tablet TAKE ONE (1) TABLET BY MOUTH EVERY DAY 30 tablet 0   tamsulosin  (FLOMAX ) 0.4 MG CAPS capsule Take 1 capsule (0.4 mg total) by mouth daily. 90 capsule 3      No current facility-administered medications for this  encounter.        REVIEW OF SYSTEMS:  Notable for that above.    PHYSICAL EXAM:   Vitals:   03/29/23 1108  BP: 116/73  Pulse: 80  Resp: 20  Temp: (!) 97.5 F (36.4 C)  SpO2: 98%    General: Alert and oriented, in no acute distress HEENT: Head is normocephalic. Extraocular movements are intact.  Neck: Neck is supple, no palpable cervical or supraclavicular lymphadenopathy. Heart: Regular in rate and rhythm with no murmurs, rubs, or gallops. Chest: Clear to auscultation bilaterally, with no rhonchi, wheezes, or rales. Abdomen: Soft, tender to palpation. Normoactive bowel sounds ausculted throughout.  Extremities: No cyanosis or edema. Lymphatics: see Neck Exam Skin: No  concerning lesions. Musculoskeletal: symmetric strength and muscle tone throughout. Neurologic: Cranial nerves II through XII are grossly intact. No obvious focalities. Speech is fluent. Coordination is intact. Psychiatric: Judgment and insight are intact. Affect is appropriate.    ECOG = 1   0 - Asymptomatic (Fully active, able to carry on all predisease activities without restriction)   1 - Symptomatic but completely ambulatory (Restricted in physically strenuous activity but ambulatory and able to carry out work of a light or sedentary nature. For example, light housework, office work)   2 - Symptomatic, <50% in bed during the day (Ambulatory and capable of all self care but unable to carry out any work activities. Up and about more than 50% of waking hours)   3 - Symptomatic, >50% in bed, but not bedbound (Capable of only limited self-care, confined to bed or chair 50% or more of waking hours)   4 - Bedbound (Completely disabled. Cannot carry on any self-care. Totally confined to bed or chair)   5 - Death    Aurea Blossom MM, Creech RH, Tormey DC, et al. [phone]). "Toxicity and response criteria of the [facility]". Am. Hillard Lowes. Oncol. 5 (6): 649-55   LABORATORY DATA:  Recent Labs       Lab Results  Component Value Date    WBC 10.3 02/27/2023    HGB 10.4 (L) 02/27/2023    HCT 31.2 (L) 02/27/2023    MCV 92.6 02/27/2023    PLT 362 02/27/2023    NEUTROABS 7.0 02/27/2023      Recent Labs       Lab Results  Component Value Date    NA 136 02/27/2023    K 4.2 02/27/2023    CL 104 02/27/2023    CO2 27 02/27/2023    GLUCOSE 100 (H) 02/27/2023    BUN 19 02/27/2023    CREATININE 0.83 02/27/2023    CALCIUM  9.2 02/27/2023          RADIOGRAPHY:  Imaging Results  NM PET Image Restage (PS) Skull Base to Thigh (F-18 FDG) Result Date: 03/24/2023 CLINICAL DATA:  Subsequent treatment strategy for non-small cell lung cancer. EXAM: NUCLEAR MEDICINE PET SKULL BASE TO  THIGH TECHNIQUE: 6.5 mCi F-18 FDG was injected intravenously. Full-ring PET imaging was performed from the skull base to thigh after the radiotracer. CT data was obtained and used for attenuation correction and anatomic localization. Fasting blood glucose: 111 mg/dl COMPARISON:  None Available. FINDINGS: NECK: Asymmetric hypermetabolic activity at the level of the RIGHT lingual tonsil with SUV max equal 9.5 on image 27. No clear CT correlation on noncontrast CT. Incidental CT findings: None. CHEST: A peripherally hypermetabolic mass in the superior segment of the LEFT lower lobe adjacent to the aorta and LEFT lower lobe bronchus measures  4.0 x 3.9 cm and has intense peripheral metabolic activity SUV max equal 14.4. Mass decreased in size from comparison FDG PET scan 12/30/2021 where the mass measures 6.4 x 4.1 cm and was equal hypermetabolic. No hypermetabolic pulmonary nodules. Extensive centrilobular emphysema the upper lobes. No hypermetabolic mediastinal lymph nodes. ABDOMEN/PELVIS: No abnormal hypermetabolic activity within the liver, pancreas, adrenal glands, or spleen. No hypermetabolic lymph nodes in the abdomen or pelvis. Incidental CT findings: There is hydroureter of the distal LEFT ureter which is dilated to 15 mm (image 118/series 4) findings similar comparison exam two thousand twenty-three. No hypermetabolic lesion or obstructing lesion identified. SKELETON: No focal hypermetabolic activity to suggest skeletal metastasis. Incidental CT findings: None. IMPRESSION: 1. Hypermetabolic mass in the superior segment of the LEFT lower lobe is consistent residual or recurrent lung carcinoma. 2. No evidence of metastatic adenopathy in the chest. 3. No evidence distant metastatic disease. 4. Asymmetric hypermetabolic activity at the level of the RIGHT lingual tonsil. Indeterminate finding. Consider contrast CT of the neck versus direct visualization. 5. Chronic hydroureter of the distal LEFT ureter. No  obstructing lesion identified. 6.  Emphysema (ICD10-J43.9). Electronically Signed   By: Deboraha Fallow M.D.   On: 03/24/2023 10:35    CT Chest W Contrast Result Date: 03/04/2023 CLINICAL DATA:  Non-small cell lung cancer (NSCLC), staging, completed chemotherapy and radiation therapy. Dyspnea and mid chest pain for 2 months. Weight loss. Restaging. * Tracking Code: BO * EXAM: CT CHEST WITH CONTRAST TECHNIQUE: Multidetector CT imaging of the chest was performed during intravenous contrast administration. RADIATION DOSE REDUCTION: This exam was performed according to the departmental dose-optimization program which includes automated exposure control, adjustment of the mA and/or kV according to patient size and/or use of iterative reconstruction technique. CONTRAST:  75mL OMNIPAQUE  IOHEXOL  300 MG/ML  SOLN COMPARISON:  02/06/2023 coronary CT.  11/17/2022 chest CT. FINDINGS: Cardiovascular: Normal heart size. Trace pericardial effusion is new. Three-vessel coronary atherosclerosis. Atherosclerotic nonaneurysmal thoracic aorta. Normal caliber pulmonary arteries. No central pulmonary emboli. Mediastinum/Nodes: No significant thyroid  nodules. Unremarkable esophagus. No axillary adenopathy. Mildly enlarged 1.1 cm subcarinal node (series 2/image 89), previously 1.0 cm on 11/17/2022 chest CT, not substantially changed. Mildly enlarged 1.1 cm left paratracheal node (series 2/image 73), previously 0.9 cm, mildly increased. No right hilar adenopathy. Lungs/Pleura: No pneumothorax. No pleural effusion. Severe centrilobular emphysema with diffuse bronchial wall thickening. Saber sheath trachea. Enlarging central left lower lobe 4.9 x 4.8 cm solid lung mass contiguous with the posterior left hilum (series 2/image 88), previously 4.0 x 3.2 cm. Mild patchy linear left parahilar opacities are minimally increased favoring evolving postradiation change. No additional significant pulmonary nodules. Upper abdomen: No acute  abnormality. Musculoskeletal: No aggressive appearing focal osseous lesions. Mild thoracic spondylosis. Partially visualized surgical hardware from ACDF in the lower cervical spine. IMPRESSION: 1. Enlarging central left lower lobe 4.9 cm solid lung mass contiguous with the posterior left hilum, suspicious for progressive local tumor recurrence. Consider PET-CT for further evaluation. 2. Newly mildly enlarged left paratracheal lymph node and stable mildly enlarged subcarinal lymph node, suspicious for nodal metastases. 3. Trace pericardial effusion is new. 4. Three-vessel coronary atherosclerosis. 5. Aortic Atherosclerosis (ICD10-I70.0) and Emphysema (ICD10-J43.9). Electronically Signed   By: Levell Reach M.D.   On: 03/04/2023 12:04        IMPRESSION: Stage IIIA (T3, N2, M0) non-small cell lung cancer, squamous cell carcinoma presented with large left lower lobe lung mass in addition to left hilar and suspicious subcarinal lymphadenopathy diagnosed in September of  2023, s/p concurrent chemoradiation followed by consolidation immunotherapy. Now with an enlarging LLL mass, consistent with residual or recurrent lung carcinoma   We reviewed the patient's case and pertinent imaging. PET demonstrates a hypermetabolic LLL lung mass. Considering the location of this tumor, this may be the cause of the chest pain he is experiencing. However, patient does have a history of esophageal stricture which has required dilation. This could also be the etiology of his pain. He has been seen by [facility] and is scheduled for a barium swallow on 04/03/2022.   Dr. Eloise Hake has reviewed the patient's previous treatment and agrees he is a candidate for palliative course of radiation to the hypermetabolic lung mass. We will wait for the results of the barium swallow before proceeding with treatment planning.   Today, I talked to the patient about the findings and work-up thus far.  We discussed the natural history of enlarging lung mass and  general treatment, highlighting the role of radiotherapy in the management.  We discussed the available radiation techniques, and focused on the details of logistics and delivery.  We reviewed the anticipated acute and late sequelae associated with radiation in this setting.  The patient was encouraged to ask questions that I answered to the best of my ability. A patient consent form was discussed and signed.  We retained a copy for our records.  The patient would like to proceed with radiation.   PLAN: Our team is working to get the patient scheduled for CT simulation. As mentioned above, we will wait for the results of the barium swallow before proceeding with treatment planning. Anticipate approximately 10-15 fractions directed to the hypermetabolic lung mass.     40 minutes of total time was spent for this patient encounter, including preparation, face-to-face counseling with the patient and coordination of care, physical exam, and documentation of the encounter.   ------------------------------------------------   Julio Ohm, PA-C   Noralee Beam, PhD, MD   [facility]  Radiation Oncology Direct Dial: [phone]  Fax: [phone] .com     This document serves as a record of services personally performed by Retta Caster, MD and Julio Ohm, PA-C. It was created on his behalf by Aleta Anda, a trained medical scribe. The creation of this record is based on the scribe's personal observations and the provider's statements to them. This document has been checked and approved by the attending provider.

## 2023-04-03 ENCOUNTER — Encounter: Payer: Self-pay | Admitting: Internal Medicine

## 2023-04-03 ENCOUNTER — Ambulatory Visit: Payer: Medicare Other | Attending: Internal Medicine | Admitting: Internal Medicine

## 2023-04-03 VITALS — BP 102/58 | HR 64 | Ht 70.0 in | Wt 131.8 lb

## 2023-04-03 DIAGNOSIS — I4819 Other persistent atrial fibrillation: Secondary | ICD-10-CM | POA: Diagnosis not present

## 2023-04-03 NOTE — Progress Notes (Signed)
HPI Cameron Roman returns today for followup of atrial fib. He is a pleasant 73 yo smoker with lung CA and persistent atrial fib who could not take dofetilide and was switched to [facility] and then underwent DCCV. He has been bothered by difficulty swallowing and has lost weight. 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 He has some residual non-cardiac chest pain. He is losing weight.  Allergies  Allergen Reactions   Percocet [Oxycodone-Acetaminophen] Itching     Current Outpatient Medications  Medication Sig Dispense Refill   amiodarone (PACERONE) 200 MG tablet Take 0.5 tablets (100 mg total) by mouth daily.     cyanocobalamin (VITAMIN B12) 1000 MCG/ML injection Inject into the muscle every 30 (thirty) days.     ELIQUIS 5 MG TABS tablet TAKE ONE TABLET BY MOUTH TWICE DAILY 60 tablet 5   fluticasone (FLONASE) 50 MCG/ACT nasal spray Place 1 spray into the nose in the morning.     Fluticasone-Umeclidin-Vilant (TRELEGY ELLIPTA) 100-62.5-25 MCG/ACT AEPB Inhale 1 puff into the lungs daily. 60 each 11   gabapentin (NEURONTIN) 300 MG capsule Take 1 capsule (300 mg total) by mouth 2 (two) times daily.     HYDROcodone-acetaminophen (NORCO) 10-325 MG tablet Take 1 tablet by mouth 3 (three) times daily.     metoprolol succinate (TOPROL XL) 25 MG 24 hr tablet Take 1 tablet (25 mg total) by mouth every evening. 90 tablet 3   Omega-3 Fatty Acids (FISH OIL) 1000 MG CAPS Take 1,000 mg by mouth in the morning and at bedtime.     omeprazole (PRILOSEC) 20 MG capsule Take 1 capsule (20 mg total) by mouth daily. 90 capsule 3   pantoprazole (PROTONIX) 40 MG tablet TAKE ONE (1) TABLET BY MOUTH EVERY DAY 90 tablet 3   pravastatin (PRAVACHOL) 20 MG tablet TAKE ONE (1) TABLET BY MOUTH EVERY DAY 30 tablet 0   tamsulosin (FLOMAX) 0.4 MG CAPS capsule Take 1 capsule (0.4 mg total) by mouth daily. 90 capsule 3   No current facility-administered medications for this visit.     Past Medical History:  Diagnosis  Date   Anxiety    Arthritis    Atrial fibrillation (HCC)    Chronic back pain    buldging disc   Chronic neck pain    ruptured disc   Cough    hx smoking   Dilated cardiomyopathy (HCC) 12/31/2022   TTE 12/27/2022: EF 40-45, global HK, normal RVSF, mildly elevated PASP, RVSP 42.4, mild LAE, moderate MR    Diverticulosis    Esophageal stricture    GERD (gastroesophageal reflux disease)    takes Prilosec daily   H/O hiatal hernia    Hearing loss    Hepatitis-C 2017   treated and cured per patient   Hiatal hernia    History of colon polyps    History of radiation therapy    Left Lung- 01/10/22-02/18/22- Dr. Antony Blackbird   Insomnia    takes Elavil nightly   Lipoma of abdominal wall 02/11/2011   Lung cancer (HCC)    Stroke (HCC)    x 2   Type 2 diabetes mellitus (HCC) 07/22/2020   patient denies DM, per MD note pt is pre-diabetic    ROS:   All systems reviewed and negative except as noted in the HPI.   Past Surgical History:  Procedure Laterality Date   ANTERIOR CERVICAL DECOMP/DISCECTOMY FUSION  04/12/2011   Procedure: ANTERIOR CERVICAL DECOMPRESSION/DISCECTOMY FUSION 2 LEVELS;  Surgeon: Danae Orleans  Venetia Maxon, MD;  Location: [facility] NEURO ORS;  Service: Neurosurgery;  Laterality: N/A;  exploration of Cervical four - seven  fusion with redo Cervical six- seven, cervical three- four anterior cervical decompression with fusion interbody prothesis plating and bonegraft and C34 anterior cervical decompression with inte   APPENDECTOMY     at age 48   ATRIAL FIBRILLATION ABLATION N/A 02/17/2023   Procedure: ATRIAL FIBRILLATION ABLATION;  Surgeon: Nobie Putnam, MD;  Location: [facility] INVASIVE CV LAB;  Service: Cardiovascular;  Laterality: N/A;   BRONCHIAL BIOPSY  11/30/2021   Procedure: BRONCHIAL BIOPSIES;  Surgeon: Josephine Igo, DO;  Location: [facility] ENDOSCOPY;  Service: Pulmonary;;   BRONCHIAL BRUSHINGS  11/30/2021   Procedure: BRONCHIAL BRUSHINGS;  Surgeon: Josephine Igo, DO;  Location:  [facility] ENDOSCOPY;  Service: Pulmonary;;   CARDIAC CATHETERIZATION  06/03/2004   CARDIOVERSION N/A 12/28/2022   Procedure: CARDIOVERSION;  Surgeon: Thomasene Ripple, DO;  Location: [facility] INVASIVE CV LAB;  Service: Cardiovascular;  Laterality: N/A;   COLONOSCOPY     EP IMPLANTABLE DEVICE N/A 09/14/2015   Procedure: Loop Recorder Insertion;  Surgeon: Marinus Maw, MD;  Location: [facility] INVASIVE CV LAB;  Service: Cardiovascular;  Laterality: N/A;   FINE NEEDLE ASPIRATION  11/30/2021   Procedure: FINE NEEDLE ASPIRATION (FNA) LINEAR;  Surgeon: Josephine Igo, DO;  Location: [facility] ENDOSCOPY;  Service: Pulmonary;;   HAND SURGERY Right 03/15/2003   right x 3   INNER EAR SURGERY Bilateral    bil;titanium in both ears   lower back surgery  03/15/2007   had plates and screws inserted   NECK SURGERY  03/15/2007   had insertion of  plates and screws   TEE WITHOUT CARDIOVERSION N/A 09/14/2015   Procedure: TRANSESOPHAGEAL ECHOCARDIOGRAM (TEE);  Surgeon: Vesta Mixer, MD;  Location: [facility] ENDOSCOPY;  Service: Cardiovascular;  Laterality: N/A;   TRANSFORAMINAL LUMBAR INTERBODY FUSION (TLIF) WITH PEDICLE SCREW FIXATION 1 LEVEL Left 09/27/2019   Procedure: Left Lumbar 5 Sacral 1 Transforaminal lumbar interbody fusion with exploration/removal of adjacent level hardware;  Surgeon: Maeola Harman, MD;  Location: [facility] OR;  Service: Neurosurgery;  Laterality: Left;  3C/RM 19   VIDEO BRONCHOSCOPY WITH ENDOBRONCHIAL ULTRASOUND N/A 11/30/2021   Procedure: VIDEO BRONCHOSCOPY WITH ENDOBRONCHIAL ULTRASOUND;  Surgeon: Josephine Igo, DO;  Location: [facility] ENDOSCOPY;  Service: Pulmonary;  Laterality: N/A;     Family History  Problem Relation Age of Onset   Other Mother        Perforated bowel   Cancer Father    Colon cancer Father    Cancer Brother        GI   Colon cancer Maternal Grandmother    Colon cancer Maternal Grandfather    Healthy Daughter    Healthy Son    Heart disease Maternal Uncle    Stroke Maternal Uncle     Anesthesia problems Neg Hx    Hypotension Neg Hx    Malignant hyperthermia Neg Hx    Pseudochol deficiency Neg Hx      Social History   Socioeconomic History   Marital status: Widowed    Spouse name: Britta Mccreedy   Number of children: 2   Years of education: 8th Grade   Highest education level: 8th grade  Occupational History   Occupation: disabled    Employer: DISABLED  Tobacco Use   Smoking status: Former    Current packs/day: 0.00    Average packs/day: 1 pack/day for 41.0 years (41.0 ttl pk-yrs)    Types: Cigarettes    Start  date: 06/28/1961    Quit date: 06/29/2002    Years since quitting: 20.7   Smokeless tobacco: Never   Tobacco comments:    Former smoker (Pt quit smoking in 2004. 10/21/21) 12/26/22  Vaping Use   Vaping status: Never Used  Substance and Sexual Activity   Alcohol use: Not Currently    Alcohol/week: 1.0 standard drink of alcohol    Types: 1 Cans of beer per week   Drug use: No   Sexual activity: Yes    Birth control/protection: None  Other Topics Concern   Not on file  Social History Narrative   Lives alone, his wife passed away 02/17/19.  Has 2 children.     Currently does not work.  On disability for wrist pain since early 2000s.   Formerly a Naval architect.   Social Drivers of Corporate investment banker Strain: Low Risk  (12/27/2022)   Received from [facility]   Overall Financial Resource Strain (CARDIA)    Difficulty of Paying Living Expenses: Not hard at all  Food Insecurity: No Food Insecurity (03/29/2023)   Hunger Vital Sign    Worried About Running Out of Food in the Last Year: Never true    Ran Out of Food in the Last Year: Never true  Transportation Needs: No Transportation Needs (03/29/2023)   PRAPARE - Administrator, Civil Service (Medical): No    Lack of Transportation (Non-Medical): No  Physical Activity: Sufficiently Active (12/27/2022)   Received from [facility]   Exercise Vital Sign    Days of Exercise per Week:  7 days    Minutes of Exercise per Session: 150+ min  Stress: No Stress Concern Present (12/27/2022)   Received from [facility] - Occupational Stress Questionnaire    Feeling of Stress : Not at all  Social Connections: Socially Integrated (12/27/2022)   Received from [facility]   Social Network    How would you rate your social network (family, work, friends)?: Good participation with social networks  Intimate Partner Violence: Not At Risk (03/29/2023)   Humiliation, Afraid, Rape, and Kick questionnaire    Fear of Current or Ex-Partner: No    Emotionally Abused: No    Physically Abused: No    Sexually Abused: No     BP (!) 102/58   Pulse 64   Ht 5\' 10"  (1.778 m)   Wt 131 lb 12.8 oz (59.8 kg)   SpO2 90%   BMI 18.91 kg/m   Physical Exam:  Thin appearing 73 yo man, NAD HEENT: Unremarkable Neck:  No JVD, no thyromegally Lymphatics:  No adenopathy Back:  No CVA tenderness Lungs:  Clear with no wheezes HEART:  Regular rate rhythm, no murmurs, no rubs, no clicks Abd:  soft, positive bowel sounds, no organomegally, no rebound, no guarding Ext:  2 plus pulses, no edema, no cyanosis, no clubbing Skin:  No rashes no nodules Neuro:  CN II through XII intact, motor grossly intact .   Assess/Plan: Persistent atrial fib - he is stable with no recurrent arrhythmias. Recurrent lung CA - he is hesitant to undergo more treatment. Dysphagia - he is pending possible EGG. He was told he needed to wait to get off of his eliquis. Coags - I asked him to stop the eliquis. Hopefully he can undergo EGD soon. I suspect he might need an esophageal stent.  Sharlot Gowda Lyndzee Kliebert,MD

## 2023-04-03 NOTE — Telephone Encounter (Signed)
Pt in office to see Dr. Ladona Ridgel today

## 2023-04-03 NOTE — Patient Instructions (Signed)
Medication Instructions:  Your physician recommends that you continue on your current medications as directed. Please refer to the Current Medication list given to you today.  Stop Taking Eliquis   *If you need a refill on your cardiac medications before your next appointment, please call your pharmacy*   Lab Work: NONE   If you have labs (blood work) drawn today and your tests are completely normal, you will receive your results only by: MyChart Message (if you have MyChart) OR A paper copy in the mail If you have any lab test that is abnormal or we need to change your treatment, we will call you to review the results.   Testing/Procedures: NONE    Follow-Up: At [facility], you and your health needs are our priority.  As part of our continuing mission to provide you with exceptional heart care, we have created designated Provider Care Teams.  These Care Teams include your primary Cardiologist (physician) and Advanced Practice Providers (APPs -  Physician Assistants and Nurse Practitioners) who all work together to provide you with the care you need, when you need it.  We recommend signing up for the patient portal called "MyChart".  Sign up information is provided on this After Visit Summary.  MyChart is used to connect with patients for Virtual Visits (Telemedicine).  Patients are able to view lab/test results, encounter notes, upcoming appointments, etc.  Non-urgent messages can be sent to your provider as well.   To learn more about what you can do with MyChart, go to [email or web address].    Your next appointment:    As Scheduled   Provider:    Dr. Jimmey Ralph    Other Instructions Thank you for choosing [facility]!

## 2023-04-04 ENCOUNTER — Telehealth: Payer: Self-pay | Admitting: *Deleted

## 2023-04-04 ENCOUNTER — Ambulatory Visit (HOSPITAL_COMMUNITY)
Admission: RE | Admit: 2023-04-04 | Discharge: 2023-04-04 | Disposition: A | Discharge status: Home / Self Care | Payer: Medicare Other | Source: Ambulatory Visit | Attending: Gastroenterology | Admitting: Gastroenterology

## 2023-04-04 ENCOUNTER — Encounter: Payer: Self-pay | Admitting: Internal Medicine

## 2023-04-04 DIAGNOSIS — R1319 Other dysphagia: Secondary | ICD-10-CM | POA: Insufficient documentation

## 2023-04-04 NOTE — Telephone Encounter (Signed)
Spoke with patient and he states Dr. Ladona Ridgel had him hold his Eliquis starting today in hope we can get him in for an EGD. Reminded patient to go for his barium esophagram today at 11 am. Please advise on next step. Can I schedule patient for EGD? Please advise.

## 2023-04-04 NOTE — Telephone Encounter (Signed)
Spot held for patient. Awaiting barium esophagram results.

## 2023-04-06 ENCOUNTER — Encounter: Payer: Medicare Other | Admitting: Internal Medicine

## 2023-04-07 ENCOUNTER — Telehealth: Payer: Self-pay | Admitting: Medical Oncology

## 2023-04-07 NOTE — Telephone Encounter (Signed)
I returned his call. He will talk to [facility] at his next appt.

## 2023-04-11 ENCOUNTER — Telehealth (INDEPENDENT_AMBULATORY_CARE_PROVIDER_SITE_OTHER): Payer: Self-pay | Admitting: Otolaryngology

## 2023-04-11 NOTE — Telephone Encounter (Signed)
Confirmed appt and address with patient for 04/12/2023.

## 2023-04-12 ENCOUNTER — Encounter (INDEPENDENT_AMBULATORY_CARE_PROVIDER_SITE_OTHER): Payer: Self-pay

## 2023-04-12 ENCOUNTER — Ambulatory Visit (INDEPENDENT_AMBULATORY_CARE_PROVIDER_SITE_OTHER): Payer: Medicare Other | Admitting: Otolaryngology

## 2023-04-12 VITALS — BP 89/63 | HR 81 | Resp 19 | Wt 122.0 lb

## 2023-04-12 DIAGNOSIS — H7292 Unspecified perforation of tympanic membrane, left ear: Secondary | ICD-10-CM

## 2023-04-12 DIAGNOSIS — H90A12 Conductive hearing loss, unilateral, left ear with restricted hearing on the contralateral side: Secondary | ICD-10-CM | POA: Diagnosis not present

## 2023-04-12 DIAGNOSIS — H6122 Impacted cerumen, left ear: Secondary | ICD-10-CM

## 2023-04-12 DIAGNOSIS — R042 Hemoptysis: Secondary | ICD-10-CM | POA: Diagnosis not present

## 2023-04-12 DIAGNOSIS — R1319 Other dysphagia: Secondary | ICD-10-CM

## 2023-04-12 DIAGNOSIS — Z9089 Acquired absence of other organs: Secondary | ICD-10-CM

## 2023-04-12 MED ORDER — OFLOXACIN 0.3 % OT SOLN: 4.0000 [drp] | Freq: Two times a day (BID) | OTIC | 1 refills | Status: DC

## 2023-04-12 NOTE — Progress Notes (Signed)
Dear Dr. Charlean Merl, Here is my assessment for our mutual patient, Cameron Roman. Thank you for allowing me the opportunity to care for your patient. Please do not hesitate to contact me should you have any other questions. Sincerely, Dr. Jovita Kussmaul  Otolaryngology Clinic Note Referring provider: Dr. Charlean Merl HPI:  Cameron Roman is a 73 y.o. male kindly referred by Dr. Charlean Merl for evaluation of dysphagia and ear trouble.  Known h/o Lung cancer s/p CRT and now with recurrence/residual tumor. Candidate for palliative rads, thinking about it currently.   Patient reports: he reports that he had trouble with swallowing for several years, but now worse. Having epigastric pain and dysphagia and odynophagia for at least last year, but worse over past few months. Can only do some liquids and food gets stuck in epigastric area. Some belching as well. He has seen GI for this and gotten a esophagram. They reported he has extrinsic compression from his cancer and thus no intervention offered, EGD was planned but due to above not performed In addition, patient also reports he's lost 22 lbs and hemoptysis.  Patient otherwise denies: - aspiration episodes or PNA, need for Heimlich - changes in voice, neck masses  Of note, he also reports that he has had some left ear discomfort for past few months. He prior saw Dr. Dorma Russell and had mastoidectomy several years ago for an infection, but otherwise denies drainage, vertigo, tinnitus. Does report hearing loss both ears. He wishes to have that examined as well. No barotrauma.   H&N Surgery: Left mastoidectomy (several years ago, Dr. Dorma Russell), ACDF Personal or FHx of bleeding dz or anesthesia difficulty: no  GLP-1: no AP/AC: Eliquis  Tobacco: quit (40 pack year)  PMHx: Non-small cell Lung Ca, COPD, CVA, A-fib (s/p ablation 02/17/2023), Cryptogenic Stroke Independent Review of Additional Tests or Records:  Clint Fenton (PA-C) EP: noted hemoptysis on 03/14/2023, not feeling  well. Did have PET (possible progression dysphagia). Dysphagia since ablation - trauma from endotracheal tube(?); h/o esophageal stricture, ref to ENT GI Doug Sou (03/21/2023): noted esophageal steonsis and h/o lung ca s/p CRT; recurrence of dysphagia, odynophagia, epigstraic pain; can't tolerate much, regurgitation; lot of belching; likely need endoscopy with possible dilation; ordered esophagagram; noted extrinsic copmression from cancer +/- dysmotility; will not benefit from dilation Dr. Judeth Horn (03/22/2023): squamous cell carcinoma, of left lung, s/p CRT; immunoRx stopped 2/2 pneumonitis, c/f recurrent on PET.  Dr. Roselind Messier Rad/Onc (03/29/2023): NSCLC with residual carcinoma; palliative radiation candidate. CBC/CMP 02/27/2023: WBC 10.3, Hgb 10.4, Plt 362; CMP generally wnl from liver marker and kidney marker standpoint Esophagram 04/04/2023 independently reviewed and interpreted: no aspiration, UES without stricture; certainly some mid-thoracic compression; modest dysmotility PET Reviewed 03/16/2023 independently and interpreted: left mastoidectomy, cuts thick but some air over ME b/l and right mastoid; ossicles relatively unremarkable but cuts thick Audio 2014:   PMH/Meds/All/SocHx/FamHx/ROS:   Past Medical History:  Diagnosis Date   Anxiety    Arthritis    Atrial fibrillation (HCC)    Chronic back pain    buldging disc   Chronic neck pain    ruptured disc   Cough    hx smoking   Dilated cardiomyopathy (HCC) 12/31/2022   TTE 12/27/2022: EF 40-45, global HK, normal RVSF, mildly elevated PASP, RVSP 42.4, mild LAE, moderate MR    Diverticulosis    Esophageal stricture    GERD (gastroesophageal reflux disease)    takes Prilosec daily   H/O hiatal hernia    Hearing loss    Hepatitis-C 2017  treated and cured per patient   Hiatal hernia    History of colon polyps    History of radiation therapy    Left Lung- 01/10/22-02/18/22- Dr. Antony Blackbird   Insomnia    takes Elavil nightly    Lipoma of abdominal wall 02/11/2011   Lung cancer (HCC)    Stroke (HCC)    x 2   Type 2 diabetes mellitus (HCC) 07/22/2020   patient denies DM, per MD note pt is pre-diabetic     Past Surgical History:  Procedure Laterality Date   ANTERIOR CERVICAL DECOMP/DISCECTOMY FUSION  04/12/2011   Procedure: ANTERIOR CERVICAL DECOMPRESSION/DISCECTOMY FUSION 2 LEVELS;  Surgeon: Dorian Heckle, MD;  Location: [facility];  Service: Neurosurgery;  Laterality: N/A;  exploration of Cervical four - seven  fusion with redo Cervical six- seven, cervical three- four anterior cervical decompression with fusion interbody prothesis plating and bonegraft and C34 anterior cervical decompression with inte   APPENDECTOMY     at age 23   ATRIAL FIBRILLATION ABLATION N/A 02/17/2023   Procedure: ATRIAL FIBRILLATION ABLATION;  Surgeon: Nobie Putnam, MD;  Location: [facility] INVASIVE CV LAB;  Service: Cardiovascular;  Laterality: N/A;   BRONCHIAL BIOPSY  11/30/2021   Procedure: BRONCHIAL BIOPSIES;  Surgeon: Josephine Igo, DO;  Location: [facility] ENDOSCOPY;  Service: Pulmonary;;   BRONCHIAL BRUSHINGS  11/30/2021   Procedure: BRONCHIAL BRUSHINGS;  Surgeon: Josephine Igo, DO;  Location: [facility] ENDOSCOPY;  Service: Pulmonary;;   CARDIAC CATHETERIZATION  06/03/2004   CARDIOVERSION N/A 12/28/2022   Procedure: CARDIOVERSION;  Surgeon: Thomasene Ripple, DO;  Location: [facility] INVASIVE CV LAB;  Service: Cardiovascular;  Laterality: N/A;   COLONOSCOPY     EP IMPLANTABLE DEVICE N/A 09/14/2015   Procedure: Loop Recorder Insertion;  Surgeon: Marinus Maw, MD;  Location: [facility] INVAS[facility] CV LAB;  Service: Cardiovascular;  Laterality: N/A;   FINE NEEDLE ASPIRATION  11/30/2021   Procedure: FINE NEEDLE ASPIRATION (FNA) LINEAR;  Surgeon: Josephine Igo, DO;  Location: [facility] ENDOSCOPY;  Service: Pulmonary;;   HAND SURGERY Right 03/15/2003   right x 3   INNER EAR SURGERY Bilateral    bil;titanium in both ears   lower back surgery  03/15/2007   had plates  and screws inserted   NECK SURGERY  03/15/2007   had insertion of  plates and screws   TEE WITHOUT CARDIOVERSION N/A 09/14/2015   Procedure: TRANSESOPHAGEAL ECHOCARDIOGRAM (TEE);  Surgeon: Vesta Mixer, MD;  Location: [facility] ENDOSCOPY;  Service: Cardiovascular;  Laterality: N/A;   TRANSFORAMINAL LUMBAR INTERBODY FUSION (TLIF) WITH PEDICLE SCREW FIXATION 1 LEVEL Left 09/27/2019   Procedure: Left Lumbar 5 Sacral 1 Transforaminal lumbar interbody fusion with exploration/removal of adjacent level hardware;  Surgeon: Maeola Harman, MD;  Location: [facility] OR;  Service: Neurosurgery;  Laterality: Left;  3C/RM 19   VIDEO BRONCHOSCOPY WITH ENDOBRONCHIAL ULTRASOUND N/A 11/30/2021   Procedure: VIDEO BRONCHOSCOPY WITH ENDOBRONCHIAL ULTRASOUND;  Surgeon: Josephine Igo, DO;  Location: [facility] ENDOSCOPY;  Service: Pulmonary;  Laterality: N/A;    Family History  Problem Relation Age of Onset   Other Mother        Perforated bowel   Cancer Father    Colon cancer Father    Cancer Brother        GI   Colon cancer Maternal Grandmother    Colon cancer Maternal Grandfather    Healthy Daughter    Healthy Son    Heart disease Maternal Uncle    Stroke Maternal Uncle    Anesthesia  problems Neg Hx    Hypotension Neg Hx    Malignant hyperthermia Neg Hx    Pseudochol deficiency Neg Hx      Social Connections: Socially Integrated (12/27/2022)   Received from [facility]   Social Network    How would you rate your social network (family, work, friends)?: Good participation with social networks      Current Outpatient Medications:    amiodarone (PACERONE) 200 MG tablet, Take 0.5 tablets (100 mg total) by mouth daily., Disp: , Rfl:    cyanocobalamin (VITAMIN B12) 1000 MCG/ML injection, Inject into the muscle every 30 (thirty) days., Disp: , Rfl:    fluticasone (FLONASE) 50 MCG/ACT nasal spray, Place 1 spray into the nose in the morning., Disp: , Rfl:    Fluticasone-Umeclidin-Vilant (TRELEGY ELLIPTA) 100-62.5-25  MCG/ACT AEPB, Inhale 1 puff into the lungs daily., Disp: 60 each, Rfl: 11   gabapentin (NEURONTIN) 300 MG capsule, Take 1 capsule (300 mg total) by mouth 2 (two) times daily., Disp: , Rfl:    HYDROcodone-acetaminophen (NORCO) 10-325 MG tablet, Take 1 tablet by mouth 3 (three) times daily., Disp: , Rfl:    metoprolol succinate (TOPROL XL) 25 MG 24 hr tablet, Take 1 tablet (25 mg total) by mouth every evening., Disp: 90 tablet, Rfl: 3   ofloxacin (FLOXIN) 0.3 % OTIC solution, Place 4 drops into both ears 2 (two) times daily., Disp: 10 mL, Rfl: 1   Omega-3 Fatty Acids (FISH OIL) 1000 MG CAPS, Take 1,000 mg by mouth in the morning and at bedtime., Disp: , Rfl:    omeprazole (PRILOSEC) 20 MG capsule, Take 1 capsule (20 mg total) by mouth daily., Disp: 90 capsule, Rfl: 3   pantoprazole (PROTONIX) 40 MG tablet, TAKE ONE (1) TABLET BY MOUTH EVERY DAY, Disp: 90 tablet, Rfl: 3   pravastatin (PRAVACHOL) 20 MG tablet, TAKE ONE (1) TABLET BY MOUTH EVERY DAY, Disp: 30 tablet, Rfl: 0   tamsulosin (FLOMAX) 0.4 MG CAPS capsule, Take 1 capsule (0.4 mg total) by mouth daily., Disp: 90 capsule, Rfl: 3   Physical Exam:   BP (!) 89/63 (BP Location: Left Arm, Patient Position: Sitting, Cuff Size: Normal)   Pulse 81   Resp 19   Wt 122 lb (55.3 kg)   SpO2 92%   BMI 17.51 kg/m   Salient findings:  CN II-XII intact  Bilateral EAC clear; left ear cerumen and debris impaction; Given history and complaints, ear microscopy was indicated and performed for evaluation with findings as below in physical exam section and in procedures. After clearance of impaction, Left ear looks like cartilage recon inferiorly and with small anterior perofration - no indwelling epithelium; landmarks hard to see on this side; postauricular scar on left c/d/I; no drainage Right ear noted significant retraction pars flaccida, prior likely PE tube placement with consequent monomeric segment anteriorly; anterior aeration; no drainage Weber 512:  left Rinne 512: AC > BC b/l  Anterior rhinoscopy: Septum intact; bilateral inferior turbinates without significant hypertrophy No lesions of oral cavity/oropharynx No obviously palpable neck masses/lymphadenopathy/thyromegaly No respiratory distress or stridor; clear intermittent hemoptysis; TFL was indicated to better evaluate the proximal airway, given the patient's history and exam findings, and is detailed below.  Seprately Identifiable Procedures:  Procedure: Bilateral ear microscopy and cerumen removal using microscope (CPT [phone]) - Mod 2 Pre-procedure diagnosis: Cerumen impaction left external ears; left ear pain with history of mastoidectomy Post-procedure diagnosis: same Indication: left cerumen impaction and history of left mastoid surgery; given patient's otologic complaints  and history as well as for improved and comprehensive examination of external ear and tympanic membrane, bilateral otologic examination using microscope was performed and impacted cerumen removed  Procedure: Patient was placed semi-recumbent. Both ear canals were examined using the microscope with findings above. Impacted Cerumen removed on left using suction and currette with improvement in EAC examination and patency. See findings above. Patient tolerated the procedure well.  Procedure Note Pre-procedure diagnosis:  Dysphagia, hemoptysis Post-procedure diagnosis: Same Procedure: Transnasal Fiberoptic Laryngoscopy, CPT 31575 - Mod 25 Indication: dysphagia, hemoptysis Complications: None apparent EBL: 0 mL  The procedure was undertaken to further evaluate the patient's complaint of dysphagia and hemoptysis, with mirror exam inadequate for appropriate examination due to gag reflex and poor patient tolerance  Procedure:  Patient was identified as correct patient. Verbal consent was obtained. The nose was sprayed with oxymetazoline and 4% lidocaine. The The flexible laryngoscope was passed through the nose to  view the nasal cavity, pharynx (oropharynx, hypopharynx) and larynx.  The larynx was examined at rest and during multiple phonatory tasks. Documentation was obtained and reviewed with patient. The scope was removed. The patient tolerated the procedure well.  Findings: The nasal cavity and nasopharynx did not reveal any masses or lesions, mucosa appeared to be without obvious lesions. The tongue base, pharyngeal walls, piriform sinuses, vallecula, epiglottis and postcricoid region are normal in appearance without any masses to explain patient's hemoptysis. Mild posterior OP fullness from his ACDF hardware. The visualized portion of the subglottis and proximal trachea is widely patent. The vocal folds are mobile bilaterally. There are no lesions on the free edge of the vocal folds nor elsewhere in the larynx worrisome for malignancy.    Electronically signed by: Read Drivers, MD 04/16/2023 9:51 AM      Impression & Plans:  Rajat Staver is a 73 y.o. male with history of Lung cancer with recurrence/residual tumor now with:  1. Tympanic membrane perforation, left   2. H/O mastoidectomy   3. 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 Esophageal dysphagia   5. Hemoptysis   6. Impacted cerumen of left ear    Multiple complaints today. Ear discomfort could be referred but also could be from cerumen or mild infection. Has a perforation on that side. He did report symptoms improved after removal of debris. Will treat him with ofloxacin x2 weeks Hemoptysis - TFL reassuring, no vocal fold or other lesions noted on scope. As such, suspect from his lung carcinoma. Will defer to pulm on that. He's thinking about palliative radiation Dysphagia and esophageal dysmotility: No UES stricture but certainly has some compression, likely extrinsic. Evaluated by GI for this, and given location, would likely defer management to them - may need G-tube if losing significant weight.   -  f/u 2 months  See below regarding exact medications prescribed this encounter including dosages and route: Meds ordered this encounter  Medications   ofloxacin (FLOXIN) 0.3 % OTIC solution    Sig: Place 4 drops into both ears 2 (two) times daily.    Dispense:  10 mL    Refill:  1      Thank you for allowing me the opportunity to care for your patient. Please do not hesitate to contact me should you have any other questions.  Sincerely, Jovita Kussmaul, MD Otolaryngologist (ENT), [facility], [facility] Phone: [phone] Fax: [phone]  04/16/2023, 9:51 AM   I have personally spent 65 minutes involved in face-to-face  and non-face-to-face activities for this patient on the day of the visit.  Professional time spent excludes any procedures performed but includes the following activities, in addition to those noted in the documentation: preparing to see the patient (review of outside documentation and results), performing a medically appropriate examination, counseling, ordering medications (ofloxacin), documenting in the electronic health record, independently interpreting results (PET, audio, Esophagram).

## 2023-04-19 ENCOUNTER — Ambulatory Visit: Payer: Medicare Other | Admitting: Internal Medicine

## 2023-04-27 ENCOUNTER — Inpatient Hospital Stay: Payer: Medicare Other | Attending: Internal Medicine

## 2023-04-27 ENCOUNTER — Inpatient Hospital Stay: Payer: Medicare Other | Admitting: Internal Medicine

## 2023-04-27 VITALS — BP 117/72 | HR 80 | Temp 97.5°F | Resp 17 | Wt 120.5 lb

## 2023-04-27 DIAGNOSIS — C3432 Malignant neoplasm of lower lobe, left bronchus or lung: Secondary | ICD-10-CM | POA: Insufficient documentation

## 2023-04-27 DIAGNOSIS — C7951 Secondary malignant neoplasm of bone: Secondary | ICD-10-CM | POA: Diagnosis not present

## 2023-04-27 DIAGNOSIS — C3412 Malignant neoplasm of upper lobe, left bronchus or lung: Secondary | ICD-10-CM

## 2023-04-27 DIAGNOSIS — C3490 Malignant neoplasm of unspecified part of unspecified bronchus or lung: Secondary | ICD-10-CM | POA: Diagnosis not present

## 2023-04-27 LAB — CBC WITH DIFFERENTIAL (CANCER CENTER ONLY)
Abs Immature Granulocytes: 0.06 10*3/uL (ref 0.00–0.07)
Basophils Absolute: 0 10*3/uL (ref 0.0–0.1)
Basophils Relative: 0 %
Eosinophils Absolute: 0.1 10*3/uL (ref 0.0–0.5)
Eosinophils Relative: 1 %
HCT: 30.7 % — ABNORMAL LOW (ref 39.0–52.0)
Hemoglobin: 10.3 g/dL — ABNORMAL LOW (ref 13.0–17.0)
Immature Granulocytes: 1 %
Lymphocytes Relative: 11 %
Lymphs Abs: 1.1 10*3/uL (ref 0.7–4.0)
MCH: 30.2 pg (ref 26.0–34.0)
MCHC: 33.6 g/dL (ref 30.0–36.0)
MCV: 90 fL (ref 80.0–100.0)
Monocytes Absolute: 0.6 10*3/uL (ref 0.1–1.0)
Monocytes Relative: 5 %
Neutro Abs: 8.9 10*3/uL — ABNORMAL HIGH (ref 1.7–7.7)
Neutrophils Relative %: 82 %
Platelet Count: 258 10*3/uL (ref 150–400)
RBC: 3.41 MIL/uL — ABNORMAL LOW (ref 4.22–5.81)
RDW: 16.8 % — ABNORMAL HIGH (ref 11.5–15.5)
WBC Count: 10.7 10*3/uL — ABNORMAL HIGH (ref 4.0–10.5)
nRBC: 0 % (ref 0.0–0.2)

## 2023-04-27 LAB — CMP (CANCER CENTER ONLY)
ALT: 12 U/L (ref 0–44)
AST: 15 U/L (ref 15–41)
Albumin: 3.2 g/dL — ABNORMAL LOW (ref 3.5–5.0)
Alkaline Phosphatase: 103 U/L (ref 38–126)
Anion gap: 6 (ref 5–15)
BUN: 19 mg/dL (ref 8–23)
CO2: 28 mmol/L (ref 22–32)
Calcium: 9.4 mg/dL (ref 8.9–10.3)
Chloride: 100 mmol/L (ref 98–111)
Creatinine: 0.69 mg/dL (ref 0.61–1.24)
GFR, Estimated: >60 mL/min (ref >60)
Glucose, Bld: 106 mg/dL — ABNORMAL HIGH (ref 70–99)
Potassium: 4 mmol/L (ref 3.5–5.1)
Sodium: 134 mmol/L — ABNORMAL LOW (ref 135–145)
Total Bilirubin: 0.5 mg/dL (ref 0.0–1.2)
Total Protein: 7.3 g/dL (ref 6.5–8.1)

## 2023-04-27 NOTE — Progress Notes (Signed)
[facility] [phone]   [phone]  OFFICE PROGRESS NOTE  Cameron Dapper, PA-C [address] [place] [address]  DIAGNOSIS:  Stage IIIB (T3, N2, M0) non-small cell lung cancer, squamous cell carcinoma presented with large left lower lobe lung mass in addition subcarinal and AP window nodal metastases. Diagnosed in September 2023.      PRIOR THERAPY:  1) Concurrent chemo/radiation with carboplatin for an AUC of 2 and paclitaxel 45 mg/m2 weekly. First dose expected 01/10/22. Status post 7 cycles.  Last dose was giving February 15, 2022 with partial response. 2) Consolidation treatment with immunotherapy with Imfinzi 1500 Mg IV every 4 weeks.  First dose March 31, 2022.  Status post 6 cycles.  This treatment was discontinued secondary to suspicious immunotherapy mediated pneumonitis.   CURRENT THERAPY: Referral to radiation oncology for palliative radiotherapy.  INTERVAL HISTORY: Cameron Roman 73 y.o. male returns to the clinic today for follow-up visit accompanied by his daughter Sofie Rower. Discussed the use of AI scribe software for clinical note transcription with the patient, who gave verbal consent to proceed.  History of Present Illness   Cameron Roman is a 73 year old male with stage 3B squamous cell carcinoma who presents for follow-up regarding treatment options. He is accompanied by his daughter, Sofie Rower.  He was diagnosed with stage 3B squamous cell carcinoma over two years ago, prior to his [age] birthday. He initially received chemoradiation followed by six months of treatment with Imfinzi, administered every four weeks. Treatment with Imfinzi was halted due to suspicious lung inflammation potentially related to the immune therapy. A subsequent scan revealed a mass in the upper part of his left lower lobe, confirmed as active by a PET scan.  He has significant weight loss, now weighing 120 pounds, and difficulty eating. He previously  experienced esophagitis, but the current mass is located away from the esophagus. He has difficulty swallowing and significant weight loss. No new areas of concern on the PET scan aside from the lung mass.  He experiences severe back pain, describing it as feeling like his back is 'going to break in two' when walking short distances. He has not seen an orthopedic surgeon but has requested an MRI to rule out a disc issue. A PET scan did not show any skeletal metastasis, but he is concerned about a potential disc problem. He has a history of arthritis, confirmed by a previous doctor, and has been a physically active individual, even in his sixties. He is frustrated with his inability to work due to back pain. He has arthritis in his shoulders and back, which has been previously diagnosed.       MEDICAL HISTORY: Past Medical History:  Diagnosis Date   Anxiety    Arthritis    Atrial fibrillation (HCC)    Chronic back pain    buldging disc   Chronic neck pain    ruptured disc   Cough    hx smoking   Dilated cardiomyopathy (HCC) 12/31/2022   TTE 12/27/2022: EF 40-45, global HK, normal RVSF, mildly elevated PASP, RVSP 42.4, mild LAE, moderate MR    Diverticulosis    Esophageal stricture    GERD (gastroesophageal reflux disease)    takes Prilosec daily   H/O hiatal hernia    Hearing loss    Hepatitis-C 2017   treated and cured per patient   Hiatal hernia    History of colon polyps    History of radiation  therapy    Left Lung- 01/10/22-02/18/22- Dr. Antony Blackbird   Insomnia    takes Elavil nightly   Lipoma of abdominal wall 02/11/2011   Lung cancer (HCC)    Stroke (HCC)    x 2   Type 2 diabetes mellitus (HCC) 07/22/2020   patient denies DM, per MD note pt is pre-diabetic    ALLERGIES:  is allergic to percocet [oxycodone-acetaminophen].  MEDICATIONS:  Current Outpatient Medications  Medication Sig Dispense Refill   amiodarone (PACERONE) 200 MG tablet Take 0.5 tablets (100 mg  total) by mouth daily.     cyanocobalamin (VITAMIN B12) 1000 MCG/ML injection Inject into the muscle every 30 (thirty) days.     fluticasone (FLONASE) 50 MCG/ACT nasal spray Place 1 spray into the nose in the morning.     Fluticasone-Umeclidin-Vilant (TRELEGY ELLIPTA) 100-62.5-25 MCG/ACT AEPB Inhale 1 puff into the lungs daily. 60 each 11   gabapentin (NEURONTIN) 300 MG capsule Take 1 capsule (300 mg total) by mouth 2 (two) times daily.     HYDROcodone-acetaminophen (NORCO) 10-325 MG tablet Take 1 tablet by mouth 3 (three) times daily.     metoprolol succinate (TOPROL XL) 25 MG 24 hr tablet Take 1 tablet (25 mg total) by mouth every evening. 90 tablet 3   ofloxacin (FLOXIN) 0.3 % OTIC solution Place 4 drops into both ears 2 (two) times daily. 10 mL 1   Omega-3 Fatty Acids (FISH OIL) 1000 MG CAPS Take 1,000 mg by mouth in the morning and at bedtime.     omeprazole (PRILOSEC) 20 MG capsule Take 1 capsule (20 mg total) by mouth daily. 90 capsule 3   pantoprazole (PROTONIX) 40 MG tablet TAKE ONE (1) TABLET BY MOUTH EVERY DAY 90 tablet 3   pravastatin (PRAVACHOL) 20 MG tablet TAKE ONE (1) TABLET BY MOUTH EVERY DAY 30 tablet 0   tamsulosin (FLOMAX) 0.4 MG CAPS capsule Take 1 capsule (0.4 mg total) by mouth daily. 90 capsule 3   No current facility-administered medications for this visit.    SURGICAL HISTORY:  Past Surgical History:  Procedure Laterality Date   ANTERIOR CERVICAL DECOMP/DISCECTOMY FUSION  04/12/2011   Procedure: ANTERIOR CERVICAL DECOMPRESSION/DISCECTOMY FUSION 2 LEVELS;  Surgeon: Dorian Heckle, MD;  Location: [facility] NEURO ORS;  Service: Neurosurgery;  Laterality: N/A;  exploration of Cervical four - seven  fusion with redo Cervical six- seven, cervical three- four anterior cervical decompression with fusion interbody prothesis plating and bonegraft and C34 anterior cervical decompression with inte   APPENDECTOMY     at age 27   ATRIAL FIBRILLATION ABLATION N/A 02/17/2023   Procedure:  ATRIAL FIBRILLATION ABLATION;  Surgeon: Nobie Putnam, MD;  Location: [facility] INVASIVE CV LAB;  Service: Cardiovascular;  Laterality: N/A;   BRONCHIAL BIOPSY  11/30/2021   Procedure: BRONCHIAL BIOPSIES;  Surgeon: Josephine Igo, DO;  Location: [facility] ENDOSCOPY;  Service: Pulmonary;;   BRONCHIAL BRUSHINGS  11/30/2021   Procedure: BRONCHIAL BRUSHINGS;  Surgeon: Josephine Igo, DO;  Location: [facility] ENDOSCOPY;  Service: Pulmonary;;   CARDIAC CATHETERIZATION  06/03/2004   CARDIOVERSION N/A 12/28/2022   Procedure: CARDIOVERSION;  Surgeon: Thomasene Ripple, DO;  Location: [facility] INVASIVE CV LAB;  Service: Cardiovascular;  Laterality: N/A;   COLONOSCOPY     EP IMPLANTABLE DEVICE N/A 09/14/2015   Procedure: Loop Recorder Insertion;  Surgeon: Marinus Maw, MD;  Location: [facility] INVASIVE CV LAB;  Service: Cardiovascular;  Laterality: N/A;   FINE NEEDLE ASPIRATION  11/30/2021   Procedure: FINE NEEDLE ASPIRATION (FNA) LINEAR;  Surgeon: Josephine Igo, DO;  Location: [facility] ENDOSCOPY;  Service: Pulmonary;;   HAND SURGERY Right 03/15/2003   right x 3   INNER EAR SURGERY Bilateral    bil;titanium in both ears   lower back surgery  03/15/2007   had plates and screws inserted   NECK SURGERY  03/15/2007   had insertion of  plates and screws   TEE WITHOUT CARDIOVERSION N/A 09/14/2015   Procedure: TRANSESOPHAGEAL ECHOCARDIOGRAM (TEE);  Surgeon: Vesta Mixer, MD;  Location: [facility] ENDOSCOPY;  Service: Cardiovascular;  Laterality: N/A;   TRANSFORAMINAL LUMBAR INTERBODY FUSION (TLIF) WITH PEDICLE SCREW FIXATION 1 LEVEL Left 09/27/2019   Procedure: Left Lumbar 5 Sacral 1 Transforaminal lumbar interbody fusion with exploration/removal of adjacent level hardware;  Surgeon: Maeola Harman, MD;  Location: [facility] OR;  Service: Neurosurgery;  Laterality: Left;  3C/RM 19   VIDEO BRONCHOSCOPY WITH ENDOBRONCHIAL ULTRASOUND N/A 11/30/2021   Procedure: VIDEO BRONCHOSCOPY WITH ENDOBRONCHIAL ULTRASOUND;  Surgeon: Josephine Igo, DO;  Location: [facility]  ENDOSCOPY;  Service: Pulmonary;  Laterality: N/A;    REVIEW OF SYSTEMS:  Constitutional: positive for fatigue Eyes: negative Ears, nose, mouth, throat, and face: negative Respiratory: positive for cough and dyspnea on exertion Cardiovascular: negative Gastrointestinal: positive for dyspepsia and dysphagia Genitourinary:negative Integument/breast: negative Hematologic/lymphatic: negative Musculoskeletal:positive for back pain Neurological: negative Behavioral/Psych: negative Endocrine: negative Allergic/Immunologic: negative   PHYSICAL EXAMINATION: General appearance: alert, cooperative, fatigued, and no distress Head: Normocephalic, without obvious abnormality, atraumatic Neck: no adenopathy, no JVD, supple, symmetrical, trachea midline, and thyroid not enlarged, symmetric, no tenderness/mass/nodules Lymph nodes: Cervical, supraclavicular, and axillary nodes normal. Resp: clear to auscultation bilaterally Back: symmetric, no curvature. ROM normal. No CVA tenderness. Cardio: regular rate and rhythm, S1, S2 normal, no murmur, click, rub or gallop GI: soft, non-tender; bowel sounds normal; no masses,  no organomegaly Extremities: extremities normal, atraumatic, no cyanosis or edema Neurologic: Alert and oriented X 3, normal strength and tone. Normal symmetric reflexes. Normal coordination and gait  ECOG PERFORMANCE STATUS: 1 - Symptomatic but completely ambulatory  Blood pressure 117/72, pulse 80, temperature (!) 97.5 F (36.4 C), temperature source Temporal, resp. rate 17, weight 120 lb 8 oz (54.7 kg), SpO2 98%.  LABORATORY DATA: Lab Results  Component Value Date   WBC 10.7 (H) 04/27/2023   HGB 10.3 (L) 04/27/2023   HCT 30.7 (L) 04/27/2023   MCV 90.0 04/27/2023   PLT 258 04/27/2023      Chemistry      Component Value Date/Time   NA 134 (L) 04/27/2023 1014   NA 136 01/24/2023 0924   K 4.0 04/27/2023 1014   CL 100 04/27/2023 1014   CO2 28 04/27/2023 1014   BUN 19  04/27/2023 1014   BUN 18 01/24/2023 0924   CREATININE 0.69 04/27/2023 1014   CREATININE 0.80 08/30/2012 0955      Component Value Date/Time   CALCIUM 9.4 04/27/2023 1014   ALKPHOS 103 04/27/2023 1014   AST 15 04/27/2023 1014   ALT 12 04/27/2023 1014   BILITOT 0.5 04/27/2023 1014       RADIOGRAPHIC STUDIES: DG ESOPHAGUS W SINGLE CM (SOL OR THIN BA) Result Date: 04/04/2023 CLINICAL DATA:  Provided history: Esophageal dysphagia. Additional history provided: The patient reports abdominal pain. EXAM: ESOPHOGRAM/BARIUM SWALLOW TECHNIQUE: A combined double contrast and single contrast examination performed using effervescent crystals, thick barium liquid, and thin barium liquid. Additionally, the patient swallowed a 13 mm barium tablet under fluoroscopy. FLUOROSCOPY: Radiation Exposure Index (as provided by the fluoroscopic device): 36.30  mGy Kerma COMPARISON:  PET CT 03/16/2023. FINDINGS: Fluoroscopic evaluation demonstrates normal caliber and smooth contour of the esophagus. No evidence of fixed stricture, mass or mucosal abnormality. Mild rightward deviation of the mid thoracic esophagus, possibly related to the patient's known pulmonary malignancy. Moderate intermittent esophageal dysmotility with tertiary contractions. No appreciable hiatal hernia. No gastroesophageal reflux observed. The patient swallowed a 13 mm barium tablet, which passed into the stomach without significant delay. IMPRESSION: 1. Moderate intermittent esophageal dysmotility with tertiary contractions. 2. Mild rightward deviation of the midthoracic esophagus, possibly related to the patient's known pulmonary malignancy. 3. Otherwise unremarkable esophagram, as described. Electronically Signed   By: Jackey Loge D.O.   On: 04/04/2023 11:35    ASSESSMENT AND PLAN:  This is a very pleasant 73 years old white male diagnosed with a stage IIIb (T3, N2, M0) non-small cell lung cancer, squamous cell carcinoma presented with large left  lower lobe lung mass in addition to subcarinal and AP window lymphadenopathy diagnosed in September 2023.  The patient underwent a course of concurrent chemoradiation with weekly carboplatin and paclitaxel status post 7 cycles of the chemotherapy.  He tolerated this treatment well with no concerning adverse effects except for the radiation-induced esophagitis and odynophagia.  He is recovering well from his treatment. He is currently undergoing a course of consolidation treatment with immunotherapy with Imfinzi 1500 Mg IV every 4 weeks status post 6 cycles.   The patient has been tolerating this treatment well but has been complaining of increasing fatigue and weakness as well as weight loss and worsening dyspnea at baseline increased with exertion. His scan showed interval increase in size of posterior perihilar soft tissue mass within the left lung concerning for disease.  Recurrence but he also has a lot of postradiation changes in that area concerning for interstitial lung disease or hypersensitivity pneumonitis.  His treatment was discontinued. He was treated with a tapered dose of prednisone for the next 6 weeks.  Repeat PET scan on March 16, 2023 showed hypermetabolic mass in the superior segment of the left lower lobe consistent with residual/recurrent lung carcinoma but no other evidence of metastatic disease.  He was referred to Dr. Roselind Messier for radiotherapy to the recurrent local disease but he was reluctant to proceed with the treatment.    Stage III B Squamous Cell Carcinoma Diagnosed over a year ago. Underwent chemo-radiation followed by six months of Imfinzi, halted due to suspicious lung inflammation. Recent PET scan confirmed an active mass in the upper part of the left lower lobe. Radiation oncologist recommended 2-3 weeks of radiation. Radiation is preferred due to localized nature of the mass and previous poor tolerance to chemotherapy. Patient expressed concerns about radiation side  effects and difficulty swallowing, but reassured that the current mass is away from the esophagus. Discussed alternative of chemotherapy, which patient poorly tolerated previously, and the option of no treatment, which could lead to cancer progression and more severe complications. - Refer to radiation oncologist for radiation therapy - Send message to Dr. Roselind Messier to expedite appointment - Schedule follow-up scan in four months to assess radiation effect  Chronic Back Pain Reports severe back pain, unable to stand or walk for long periods. PET scan did not show any signs of cancer in the spine, likely due to arthritis or disc issues. Patient requests MRI to rule out disc problems. Discussed referral to orthopedic surgeon for further evaluation and potential pain management to facilitate radiation therapy. - Refer to orthopedic surgeon for further evaluation -  Consider MRI of the back to assess for disc issues  General Health Maintenance Discussed the importance of managing arthritis and potential disc issues to facilitate radiation therapy. - Refer to orthopedic surgeon for arthritis management - Consider pain management strategies to aid in radiation therapy  Follow-up - Schedule follow-up appointment in four months with a scan to assess the effect of radiation therapy.   The patient was advised to call immediately if he has any concerning symptoms in the interval. The patient voices understanding of current disease status and treatment options and is in agreement with the current care plan.  All questions were answered. The patient knows to call the clinic with any problems, questions or concerns. We can certainly see the patient much sooner if necessary.  The total time spent in the appointment was 30 minutes.  Disclaimer: This note was dictated with voice recognition software. Similar sounding words can inadvertently be transcribed and may not be corrected upon review.

## 2023-05-05 ENCOUNTER — Emergency Department (HOSPITAL_COMMUNITY): Payer: Medicare Other

## 2023-05-05 ENCOUNTER — Emergency Department (HOSPITAL_COMMUNITY)
Admission: EM | Admit: 2023-05-05 | Discharge: 2023-05-05 | Disposition: A | Discharge status: Home / Self Care | Payer: Medicare Other | Attending: Emergency Medicine | Admitting: Emergency Medicine

## 2023-05-05 ENCOUNTER — Other Ambulatory Visit: Payer: Self-pay

## 2023-05-05 ENCOUNTER — Telehealth: Payer: Self-pay | Admitting: Medical Oncology

## 2023-05-05 ENCOUNTER — Encounter (HOSPITAL_COMMUNITY): Payer: Self-pay

## 2023-05-05 DIAGNOSIS — M546 Pain in thoracic spine: Secondary | ICD-10-CM | POA: Insufficient documentation

## 2023-05-05 DIAGNOSIS — G8929 Other chronic pain: Secondary | ICD-10-CM | POA: Diagnosis not present

## 2023-05-05 DIAGNOSIS — W19XXXA Unspecified fall, initial encounter: Secondary | ICD-10-CM | POA: Diagnosis not present

## 2023-05-05 MED ORDER — MORPHINE SULFATE (PF) 4 MG/ML IV SOLN
4.0000 mg | Freq: Once | INTRAVENOUS | Status: AC
  Administered 2023-05-05: 4 mg via INTRAMUSCULAR
  Filled 2023-05-05: qty 1

## 2023-05-05 MED ORDER — LIDOCAINE 5 % EX PTCH
1.0000 | MEDICATED_PATCH | CUTANEOUS | Status: DC
  Administered 2023-05-05: 1 via TRANSDERMAL
  Filled 2023-05-05: qty 1

## 2023-05-05 MED ORDER — LIDOCAINE 4 % EX PTCH: 1.0000 | MEDICATED_PATCH | CUTANEOUS | 0 refills | Status: DC

## 2023-05-05 MED ORDER — ONDANSETRON 4 MG PO TBDP
4.0000 mg | ORAL_TABLET | Freq: Once | ORAL | Status: AC
  Administered 2023-05-05: 4 mg via ORAL
  Filled 2023-05-05: qty 1

## 2023-05-05 MED ORDER — KETOROLAC TROMETHAMINE 15 MG/ML IJ SOLN
15.0000 mg | Freq: Once | INTRAMUSCULAR | Status: AC
Start: 2023-05-05 — End: 2023-05-05
  Administered 2023-05-05: 15 mg via INTRAMUSCULAR
  Filled 2023-05-05: qty 1

## 2023-05-05 NOTE — ED Notes (Signed)
Pt ambulated with a steady gait and no assistance.

## 2023-05-05 NOTE — ED Provider Notes (Signed)
[place] EMERGENCY DEPARTMENT AT [facility] Provider Note   CSN: [id number] Arrival date & time: 05/05/23  1104     History  Chief Complaint  Patient presents with   Back Pain    Cameron Roman is a 73 y.o. male.  Patient is a 73 year old male who presents emergency department the chief complaint of thoracic back pain which has been ongoing for months though became worse over the past 2 weeks.  Patient notes that he is currently undergoing treatment for lung cancer and has been referred to orthopedics but has not received an appointment as of yet.  He notes that 2 weeks ago he did really twist his back and fall while walking his dog.  He notes that he was not evaluated after that time.  He notes that he has had no associated urinary bowel incontinence, saddle paresthesias, gait changes, fever, chills.  He denies any pain to his neck or lower back.  He has had no associated chest pain or shortness of breath.  He denies any associated dizziness, lightheadedness or syncope.   Back Pain      Home Medications Prior to Admission medications   Medication Sig Start Date End Date Taking? Authorizing Provider  lidocaine 4 % Place 1 patch onto the skin daily. 05/05/23  Yes Lelon Perla, PA-C  amiodarone (PACERONE) 200 MG tablet Take 0.5 tablets (100 mg total) by mouth daily. 03/17/23 03/16/24  Fenton, Clint R, PA  cyanocobalamin (VITAMIN B12) 1000 MCG/ML injection Inject into the muscle every 30 (thirty) days. 11/10/22   [provider]  fluticasone (FLONASE) 50 MCG/ACT nasal spray Place 1 spray into the nose in the morning.    [provider]  Fluticasone-Umeclidin-Vilant (TRELEGY ELLIPTA) 100-62.5-25 MCG/ACT AEPB Inhale 1 puff into the lungs daily. 12/21/22   Hunsucker, Lesia Sago, MD  gabapentin (NEURONTIN) 300 MG capsule Take 1 capsule (300 mg total) by mouth 2 (two) times daily. 12/31/22   Tereso Newcomer T, PA-C  HYDROcodone-acetaminophen (NORCO) 10-325 MG  tablet Take 1 tablet by mouth 3 (three) times daily. 06/07/22   [provider]  metoprolol succinate (TOPROL XL) 25 MG 24 hr tablet Take 1 tablet (25 mg total) by mouth every evening. 01/24/23   Nobie Putnam, MD  ofloxacin (FLOXIN) 0.3 % OTIC solution Place 4 drops into both ears 2 (two) times daily. 04/12/23   Read Drivers, MD  Omega-3 Fatty Acids (FISH OIL) 1000 MG CAPS Take 1,000 mg by mouth in the morning and at bedtime.    [provider]  omeprazole (PRILOSEC) 20 MG capsule Take 1 capsule (20 mg total) by mouth daily. 08/30/21   Dettinger, Elige Radon, MD  pantoprazole (PROTONIX) 40 MG tablet TAKE ONE (1) TABLET BY MOUTH EVERY DAY 11/09/22   Hilarie Fredrickson, MD  pravastatin (PRAVACHOL) 20 MG tablet TAKE ONE (1) TABLET BY MOUTH EVERY DAY 12/06/22   Dettinger, Elige Radon, MD  tamsulosin (FLOMAX) 0.4 MG CAPS capsule Take 1 capsule (0.4 mg total) by mouth daily. 08/30/21   Dettinger, Elige Radon, MD      Allergies    Percocet [oxycodone-acetaminophen]    Review of Systems   Review of Systems  Musculoskeletal:  Positive for back pain.  All other systems reviewed and are negative.   Physical Exam Updated Vital Signs BP 122/75   Pulse 82   Temp 97.9 F (36.6 C) (Oral)   Resp 16   Ht 5\' 10"  (1.778 m)   Wt 54.7 kg  SpO2 98%   BMI 17.29 kg/m  Physical Exam Vitals reviewed.  Constitutional:      Appearance: Normal appearance.  HENT:     Head: Normocephalic and atraumatic.     Nose: Nose normal.     Mouth/Throat:     Mouth: Mucous membranes are moist.  Eyes:     Extraocular Movements: Extraocular movements intact.     Conjunctiva/sclera: Conjunctivae normal.     Pupils: Pupils are equal, round, and reactive to light.  Cardiovascular:     Rate and Rhythm: Normal rate and regular rhythm.     Pulses: Normal pulses.     Heart sounds: Normal heart sounds.  Pulmonary:     Effort: Pulmonary effort is normal. No respiratory distress.     Breath sounds: Normal breath  sounds. No stridor. No wheezing, rhonchi or rales.  Abdominal:     General: Abdomen is flat. Bowel sounds are normal.     Palpations: Abdomen is soft.  Musculoskeletal:        General: Normal range of motion.     Cervical back: Normal range of motion and neck supple.     Comments: Tenderness palpation over mid thoracic back, no step-off or deformity, tenderness along paraspinous muscles of thoracic spine, full range of motion noted  Skin:    General: Skin is warm and dry.  Neurological:     General: No focal deficit present.     Mental Status: He is alert and oriented to person, place, and time. Mental status is at baseline.     Cranial Nerves: No cranial nerve deficit.     Sensory: No sensory deficit.     Motor: No weakness.     Coordination: Coordination normal.     Gait: Gait normal.     Deep Tendon Reflexes: Reflexes normal.  Psychiatric:        Mood and Affect: Mood normal.        Behavior: Behavior normal.        Thought Content: Thought content normal.        Judgment: Judgment normal.     ED Results / Procedures / Treatments   Labs (all labs ordered are listed, but only abnormal results are displayed) Labs Reviewed - No data to display  EKG None  Radiology MR THORACIC SPINE WO CONTRAST Result Date: 05/05/2023 CLINICAL DATA:  Chronic mid back pain, acutely worsened after a fall 2 weeks ago. History of lung cancer. EXAM: MRI THORACIC SPINE WITHOUT CONTRAST TECHNIQUE: Multiplanar, multisequence MR imaging of the thoracic spine was performed. No intravenous contrast was administered. COMPARISON:  CT thoracic spine 05/05/2023 FINDINGS: Alignment:  Grade 1 anterolisthesis of C7 on T1. Vertebrae: Preserved vertebral body heights. Mild T1 and STIR marrow hyperintensity throughout multiple adjacent vertebrae in the mid and lower thoracic spine and, likely the sequelae of previous radiation therapy. Somewhat more focally prominent edema within the left-sided posterior elements of  T5 without a visible fracture by MR or on today's earlier CT, possibly reactive. Cord: Normal cord signal and morphology. No evidence of epidural tumor on this unenhanced study. Paraspinal and other soft tissues: Left lower lobe and hilar mass, more fully evaluated on today's earlier CT and last month's PET-CT. Disc levels: Scattered mild disc bulging and mild facet arthrosis in the thoracic spine without significant stenosis. IMPRESSION: 1. Postradiation changes without evidence of thoracic spine metastatic disease on this unenhanced study. 2. Mild thoracic spondylosis and facet arthrosis without stenosis. 3. Known left lower lobe lung cancer.  Electronically Signed   By: Sebastian Ache M.D.   On: 05/05/2023 16:27   CT Thoracic Spine Wo Contrast Result Date: 05/05/2023 CLINICAL DATA:  Ataxia, of thoracic trauma. Degenerative disc disease. Chronic back pain. Patient reports that he tripped over his dogs 2 weeks ago. The patient fell to his knees and has a head increasing back pain since. Today he was unable to ambulate. EXAM: CT THORACIC SPINE WITHOUT CONTRAST TECHNIQUE: Multidetector CT images of the thoracic were obtained using the standard protocol without intravenous contrast. RADIATION DOSE REDUCTION: This exam was performed according to the departmental dose-optimization program which includes automated exposure control, adjustment of the mA and/or kV according to patient size and/or use of iterative reconstruction technique. COMPARISON:  CT chest with contrast 02/27/2023 FINDINGS: Alignment: Grade 1 anterolisthesis at C7-T1 measures 3 mm. No other significant listhesis is present in the cervical spine. Normal thoracic kyphosis is present. Vertebrae: Mild osteopenia is present. No acute or healing fractures are present. No definite metastatic disease is present. Paraspinal and other soft tissues: Atherosclerotic calcifications are present at the aortic arch and proximal great vessels. A left perihilar mass  encompasses the mainstem bronchi. The mass now extends into the left lower lobe bronchus. Mass is increased in size since prior studies, now measuring 5.2 x 6.9 x 6.6 cm. Disc levels: No significant disc disease or central canal stenosis is evident. The foramina are patent bilaterally. Tumor does not encroach the spinal canal or foramina. IMPRESSION: 1. No acute or healing fractures. 2. Grade 1 anterolisthesis at C7-T1 measures 3 mm. 3. Left perihilar mass encompasses the mainstem bronchi and now extends into the left lower lobe bronchus. The mass has increased in size since prior studies, now measuring 5.2 x 6.9 x 6.6 cm. 4. No significant disc disease or central canal stenosis. Electronically Signed   By: Marin Roberts M.D.   On: 05/05/2023 13:42   CT Lumbar Spine Wo Contrast Result Date: 05/05/2023 CLINICAL DATA:  Ataxia. Degenerative disc disease with chronic back pain. The patient reports tripping over the dogs 2 weeks ago. The pain has escalated and became so great today that he was unable to walk. EXAM: CT LUMBAR SPINE WITHOUT CONTRAST TECHNIQUE: Multidetector CT imaging of the lumbar spine was performed without intravenous contrast administration. Multiplanar CT image reconstructions were also generated. RADIATION DOSE REDUCTION: This exam was performed according to the departmental dose-optimization program which includes automated exposure control, adjustment of the mA and/or kV according to patient size and/or use of iterative reconstruction technique. COMPARISON:  MRI of the lumbar spine 08/14/2019 FINDINGS: Segmentation: 5 non rib-bearing lumbar type vertebral bodies are present. The lowest fully formed vertebral body is L5. Alignment: Slight degenerative retrolisthesis has progressed at T12-L1 and L1-2. Straightening of the normal lumbar lordosis is present. Vertebrae: Progressive inferior endplate Schmorl's nodes are noted at T12 and L1. No acute or healing fractures are present. Paraspinal  and other soft tissues: Dense atherosclerotic calcifications are present within the abdominal aorta. No aneurysm is present. Visualized intra-structures are otherwise within scratched at bilateral nephrolithiasis is present without obstruction. A stone at the lower pole of the left kidney measures up to 7 mm. No other solid organ lesions are present. No significant adenopathy is present. The paraspinous musculature is within normal limits. Disc levels: T12-L1: A broad-based disc protrusion is present. Mild facet hypertrophy is noted. No significant stenosis or change is present. L1-2: A broad-based disc protrusion is asymmetric to the left. Moderate left and mild right subarticular narrowing  is present. Moderate foraminal stenosis is present bilaterally. L2-3: Mild left subarticular narrowing is present. Moderate foraminal stenosis is worse left than right. L3-4: Solid fusion is present. Pedicle screws were removed. Laminectomy is noted. No residual or recurrent stenosis is present. L4-5: Solid fusion is present. Laminectomy is present bilaterally. No residual or recurrent stenosis is present. L5-S1: Solid fusion is present. No residual or recurrent stenosis is present. IMPRESSION: 1. Solid fusion from L3-4 through L5-S1 without residual or recurrent stenosis. 2. Progressive inferior endplate Schmorl's nodes at T12 and L1. 3. Moderate left and mild right subarticular narrowing at L1-2. 4. Moderate foraminal stenosis bilaterally at L1-2 and L2-3. 5. Mild left subarticular narrowing at L2-3. 6. Bilateral nephrolithiasis without obstruction. Electronically Signed   By: Marin Roberts M.D.   On: 05/05/2023 13:36    Procedures Procedures    Medications Ordered in ED Medications  lidocaine (LIDODERM) 5 % 1 patch (has no administration in time range)  morphine (PF) 4 MG/ML injection 4 mg (4 mg Intramuscular Given 05/05/23 1204)  ondansetron (ZOFRAN-ODT) disintegrating tablet 4 mg (4 mg Oral Given 05/05/23  1204)  ketorolac (TORADOL) 15 MG/ML injection 15 mg (15 mg Intramuscular Given 05/05/23 1204)    ED Course/ Medical Decision Making/ A&P Clinical Course as of 05/05/23 1654  Fri May 05, 2023  1271 73 year old male here with worsening back pain after a fall.  He has chronic back pain.  Neurologically intact.  Dispo pending results of CT imaging. [MB]    Clinical Course User Index [MB] Terrilee Files, MD                                 Medical Decision Making This patient presents to the ED for concern of present back pain differential diagnosis includes epidural abscess, vertebral osteomyelitis, chronic pain, vertebral fracture   Additional history obtained:  Additional history obtained from record records External records from outside source obtained and reviewed including medical records   Lab Tests:  I Ordered, and personally interpreted labs.  The pertinent results include: None   Imaging Studies ordered:  I ordered imaging studies including MRI and CT scan of thoracic spine I independently visualized and interpreted imaging which showed no acute osseous injury or lesions, no epidural abscess I agree with the radiologist interpretation   Medicines ordered and prescription drug management:  I ordered medication including lidocaine patch for back pain Reevaluation of the patient after these medicines showed that the patient improved I have reviewed the patients home medicines and have made adjustments as needed   Problem List / ED Course:  Patient is doing well at this time and is stable for discharge home.  MRI of the thoracic spine demonstrated no signs of acute intraosseous lesions, abscess formation, osteomyelitis.  Suspect the patient is suffering from an exacerbation of his chronic back pain secondary to the recent fall.  Patient notes that pain greatly improved with treatment in the emergency department and he was able to ambulate with nursing staff without  difficulty.  He has no other concerning neurological deficits.  He was nontender palpation over cervical and lumbar spine.  He has had no chest pain or shortness of breath and do not suspect an acute intrathoracic process such as ACS or pulmonary embolus at this time.  The need for close follow-up on an outpatient basis was discussed as well as strict return precautions for any new or worsening symptoms.  Patient voiced understanding and had no additional questions.   Social Determinants of Health:   None     Amount and/or Complexity of Data Reviewed Radiology: ordered.  Risk OTC drugs. Prescription drug management.           Final Clinical Impression(s) / ED Diagnoses Final diagnoses:  Chronic midline thoracic back pain    Rx / DC Orders ED Discharge Orders          Ordered    lidocaine 4 %  Every 24 hours        05/05/23 1654              Kathlen Mody 05/05/23 1659    Terrilee Files, MD 05/05/23 [id number]

## 2023-05-05 NOTE — Discharge Instructions (Signed)
Please follow-up closely with your oncologist and primary care doctor on an outpatient basis.  Return to emergency department immediately for any new or worsening symptoms.

## 2023-05-05 NOTE — ED Triage Notes (Signed)
Pt reports he has a hx of DDD and has chronic back pain.  Pt reports 2 weeks ago his dogs tripped him and he fell on his knees and his back pain has increased ever since.  Pt reports he has tried increasing his hydrocodone but it has not helped and today his back hurt so bad he had to drop on his knees from the pain.

## 2023-05-05 NOTE — Telephone Encounter (Signed)
Returned pt call . He is in ED and had MRI for his back.

## 2023-05-06 ENCOUNTER — Ambulatory Visit
Admission: RE | Admit: 2023-05-06 | Discharge: 2023-05-06 | Disposition: A | Discharge status: Home / Self Care | Payer: Medicare Other | Source: Ambulatory Visit | Attending: Nurse Practitioner | Admitting: Nurse Practitioner

## 2023-05-06 DIAGNOSIS — K7469 Other cirrhosis of liver: Secondary | ICD-10-CM

## 2023-05-06 DIAGNOSIS — D376 Neoplasm of uncertain behavior of liver, gallbladder and bile ducts: Secondary | ICD-10-CM

## 2023-05-06 DIAGNOSIS — K760 Fatty (change of) liver, not elsewhere classified: Secondary | ICD-10-CM

## 2023-05-06 MED ORDER — GADOPICLENOL 0.5 MMOL/ML IV SOLN
6.0000 mL | Freq: Once | INTRAVENOUS | Status: AC | PRN
  Administered 2023-05-06: 6 mL via INTRAVENOUS

## 2023-05-07 NOTE — ED Notes (Signed)
 05/07/2023 1053: pt called and informed that the medications he had called about earlier in the day were found and that he could pick them up at the front registration. Pt states daughter will come pick medication up. Registration given information.

## 2023-05-08 ENCOUNTER — Other Ambulatory Visit: Payer: Self-pay | Admitting: Family Medicine

## 2023-05-08 ENCOUNTER — Encounter: Payer: Self-pay | Admitting: Physician Assistant

## 2023-05-08 DIAGNOSIS — E782 Mixed hyperlipidemia: Secondary | ICD-10-CM

## 2023-06-09 ENCOUNTER — Ambulatory Visit: Payer: Medicare Other | Attending: Cardiology | Admitting: Cardiology

## 2023-06-09 NOTE — Progress Notes (Deleted)
 Electrophysiology Office Note:   Date:  06/09/2023  ID:  Cameron Roman, DOB 1950-08-19, MRN [id number]  Primary Cardiologist: None Electrophysiologist: Nobie Putnam, MD  {Click to update primary MD,subspecialty MD or APP then REFRESH:1}    History of Present Illness:   Cameron Roman is a 73 y.o. male with h/o squamous cell carcinoma of the lung, COPD, stroke, diabetes and persistent atrial fibrillation who is being seen today for follow up evaluation after ablation.  Discussed the use of AI scribe software for clinical note transcription with the patient, who gave verbal consent to proceed.  History of Present Illness     Review of systems complete and found to be negative unless listed in HPI.   EP Information / Studies Reviewed:    {EKGtoday:28818}      Echo 02/13/23:  1. Left ventricular ejection fraction, by estimation, is 55 to 60%. The  left ventricle has normal function. The left ventricle has no regional  wall motion abnormalities. Left ventricular diastolic parameters are  indeterminate.   2. Right ventricular systolic function is normal. The right ventricular  size is normal. There is normal pulmonary artery systolic pressure.   3. Compared to echo from Oct 2024, MR is improved. . Mild mitral valve  regurgitation.   4. The aortic valve is normal in structure. Aortic valve regurgitation is  not visualized.   5. The inferior vena cava is dilated in size with >50% respiratory  variability, suggesting right atrial pressure of 8 mmHg.    Risk Assessment/Calculations:    CHA2DS2-VASc Score = 4  {Confirm score is correct.  If not, click here to update score.  REFRESH note.  :1} This indicates a 4.8% annual risk of stroke. The patient's score is based upon: CHF History: 1 HTN History: 0 Diabetes History: 0 Stroke History: 2 Vascular Disease History: 0 Age Score: 1 Gender Score: 0   {This patient has a significant risk of stroke if diagnosed with atrial  fibrillation.  Please consider VKA or DOAC agent for anticoagulation if the bleeding risk is acceptable.   You can also use the SmartPhrase .[email or web address] for documentation.   :[id number]} No BP recorded.  {Refresh Note OR Click here to enter BP  :1}***        Physical Exam:   VS:  There were no vitals taken for this visit.   Wt Readings from Last 3 Encounters:  05/05/23 120 lb 8 oz (54.7 kg)  04/27/23 120 lb 8 oz (54.7 kg)  04/12/23 122 lb (55.3 kg)     GEN: Well nourished, well developed in no acute distress NECK: No JVD CARDIAC: {EPRHYTHM:28826}, no murmurs, rubs, gallops RESPIRATORY:  Clear to auscultation without rales, wheezing or rhonchi  ABDOMEN: Soft, non-distended EXTREMITIES:  No edema; No deformity   ASSESSMENT AND PLAN:    #Persistent Atrial Fibrillation: Symptomatic.  Failed Tikosyn. Continue amiodarone 200 mg once daily Continue Eliquis 5 mg BID Stop diltiazem given reduced LVEF and start metoprolol 25 XL once daily at night. Repeat echocardiogram.   #Secondary Hypercoagulable State: The patient's CHA2DS2-VASc score is 4, indicating a 4.8% annual risk of stroke.   Continue Eliquis.   #Lung Cancer Stage IIIB non-small cell lung cancer Followed by Dr. Arbutus Ped. I have reached out to Dr. Arbutus Ped to discuss his overall prognosis as well as any plans for future procedure or biopsies as he will need uninterrupted oral anti-coagulation for ablation (1 month pre and 3 months post).   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 Last EF 40-45%. Well compensated. Start metoprolol XL 25mg  once daily. Stop diltiazem given h/o reduced EF.  Repeat echocardiogram after having been in normal sinus rhythm for 3 to 4 weeks.   Follow up with {WUJWJ:19147} {EPFOLLOW WG:95621}  Signed, Nobie Putnam, MD

## 2023-06-12 ENCOUNTER — Encounter: Payer: Self-pay | Admitting: Cardiology

## 2023-06-13 ENCOUNTER — Ambulatory Visit (INDEPENDENT_AMBULATORY_CARE_PROVIDER_SITE_OTHER): Payer: Medicare Other

## 2023-06-13 ENCOUNTER — Ambulatory Visit (INDEPENDENT_AMBULATORY_CARE_PROVIDER_SITE_OTHER): Payer: Medicare Other | Admitting: Audiology

## 2023-06-21 ENCOUNTER — Ambulatory Visit: Payer: Medicare Other | Admitting: Pulmonary Disease

## 2023-06-22 ENCOUNTER — Encounter: Payer: Self-pay | Admitting: Pulmonary Disease

## 2023-07-11 IMAGING — US US ABDOMEN LIMITED
1 series · 14 of 25 positions shown · non-contrast
Comparison: Abdominal ultrasound, 08/13/2020 and 03/10/2020.

CLINICAL DATA: Cirrhosis.

EXAM:
ULTRASOUND ABDOMEN LIMITED RIGHT UPPER QUADRANT

[Series 1: us abdomen limited · 0.17mm/px · 14 of 53 slices shown]
[im 1/53]
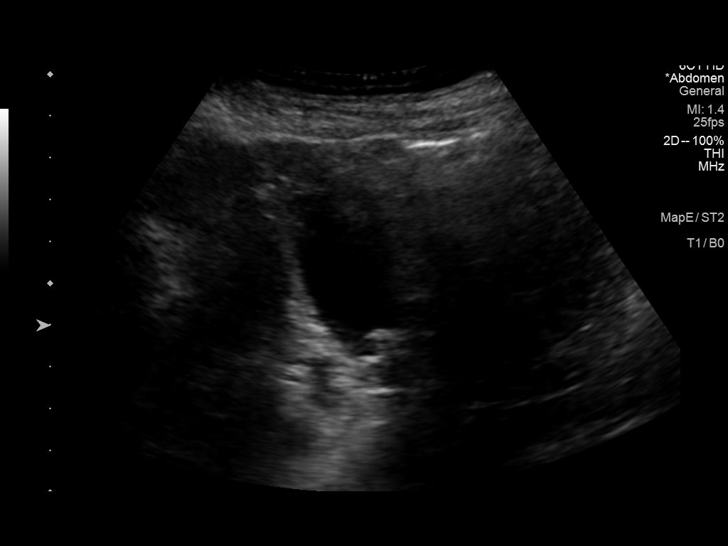
[im 5/53]
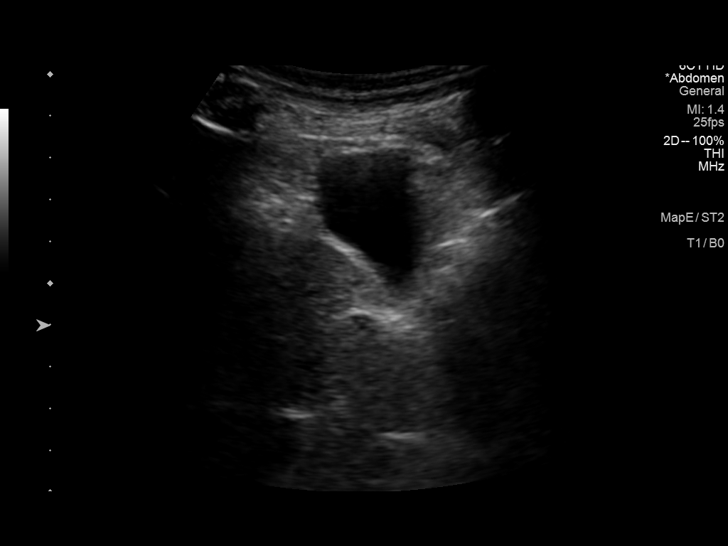
[im 9/53]
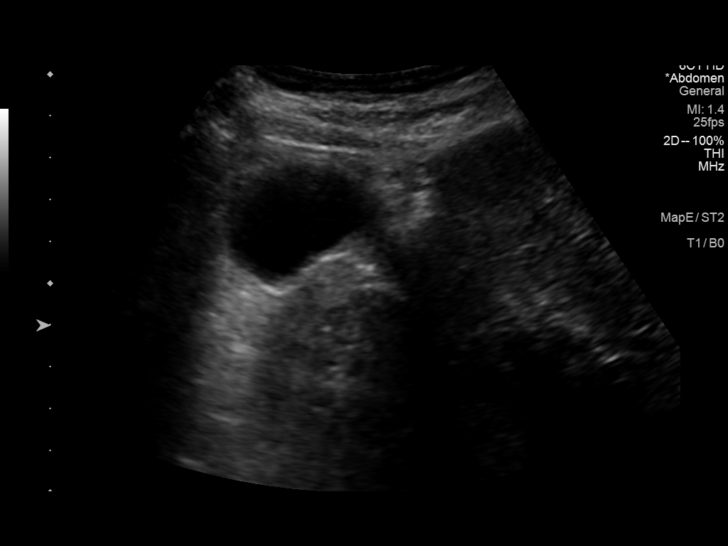
[im 14/53]
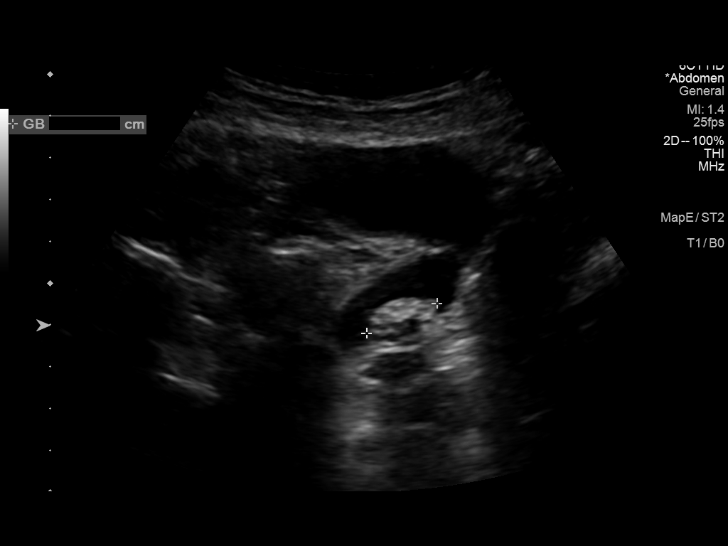
[im 18/53]
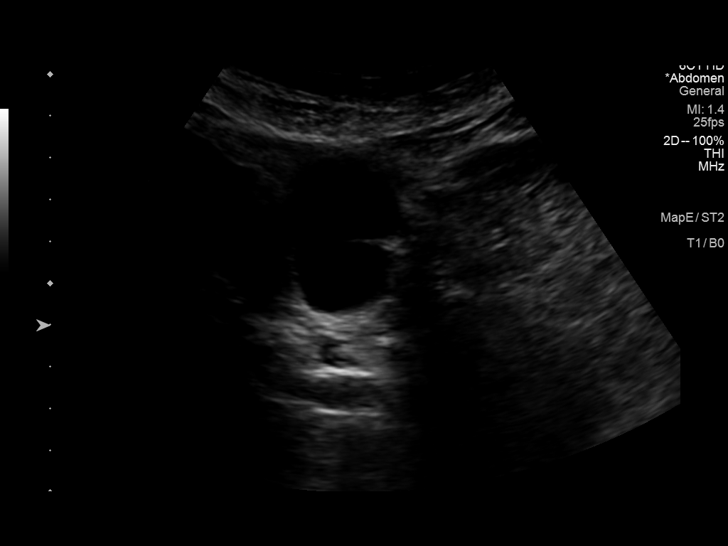
[im 20/53]
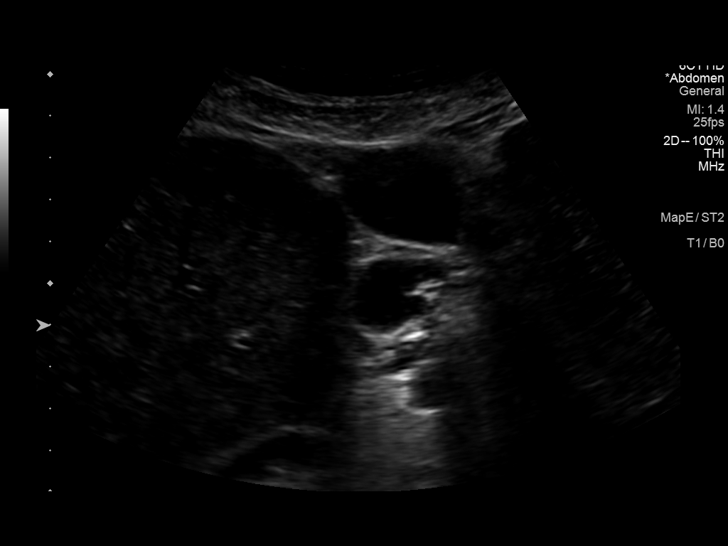
[im 24/53]
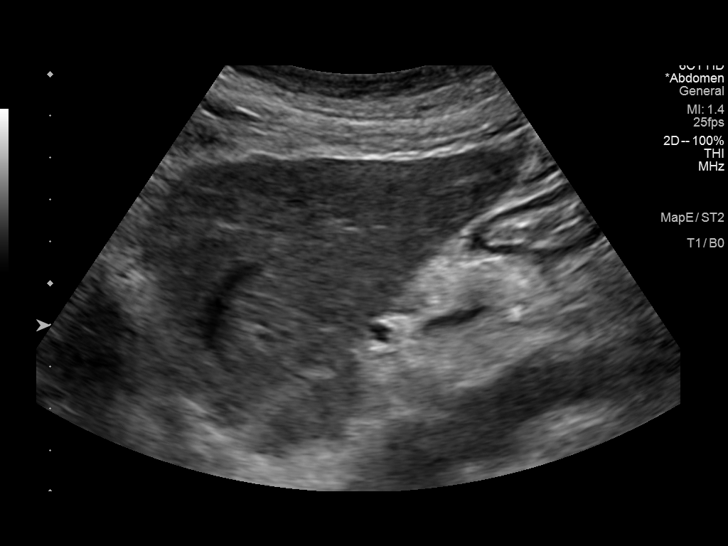
[im 29/53]
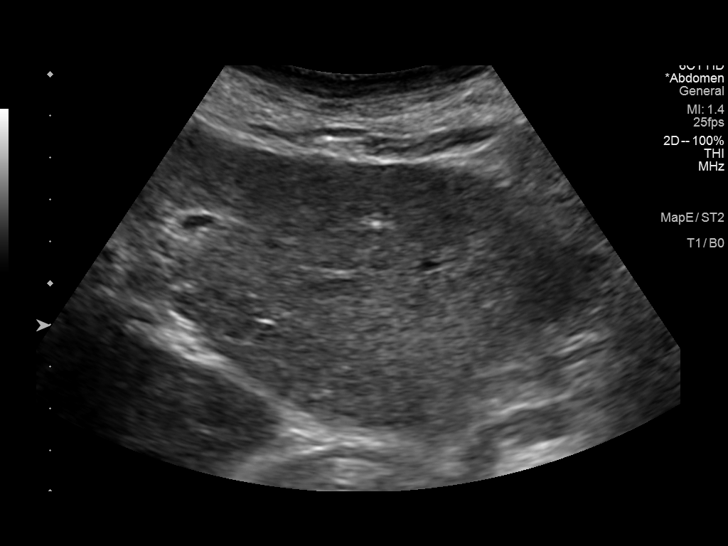
[im 33/53]
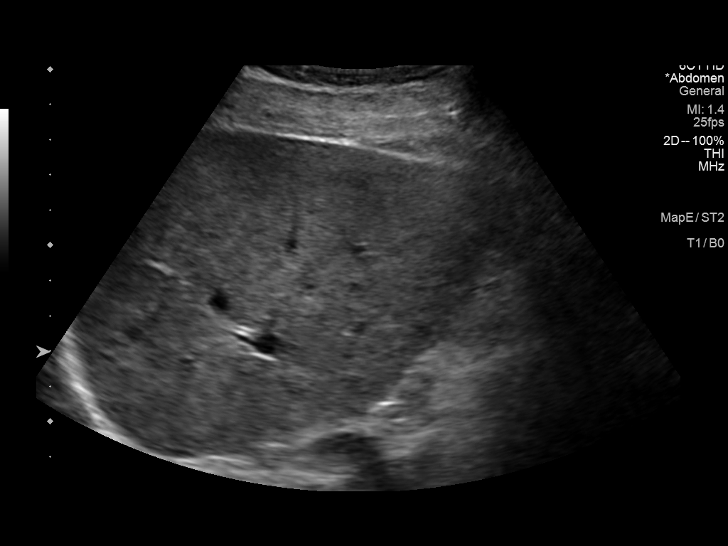
[im 35/53]
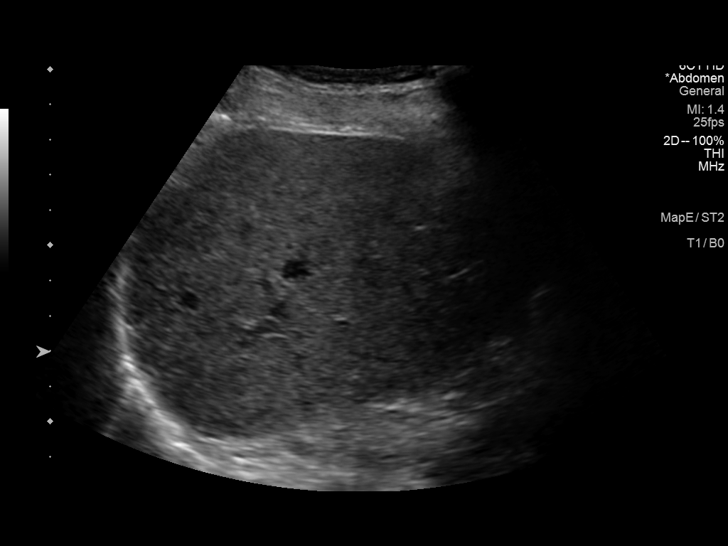
[im 40/53]
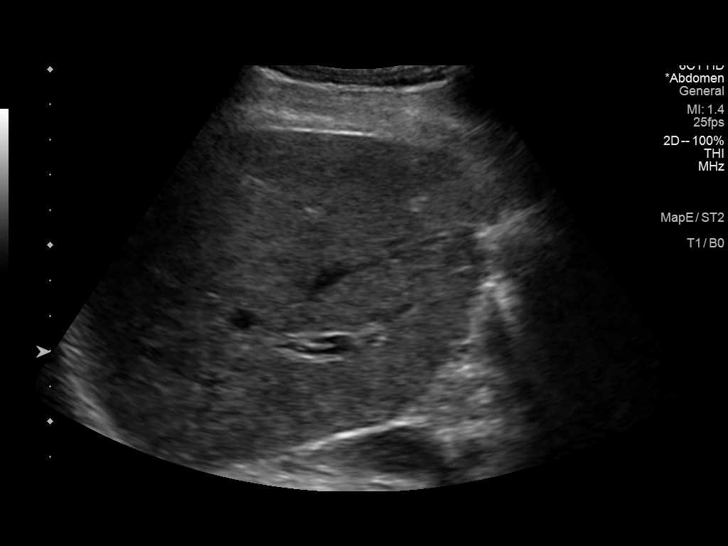
[im 44/53]
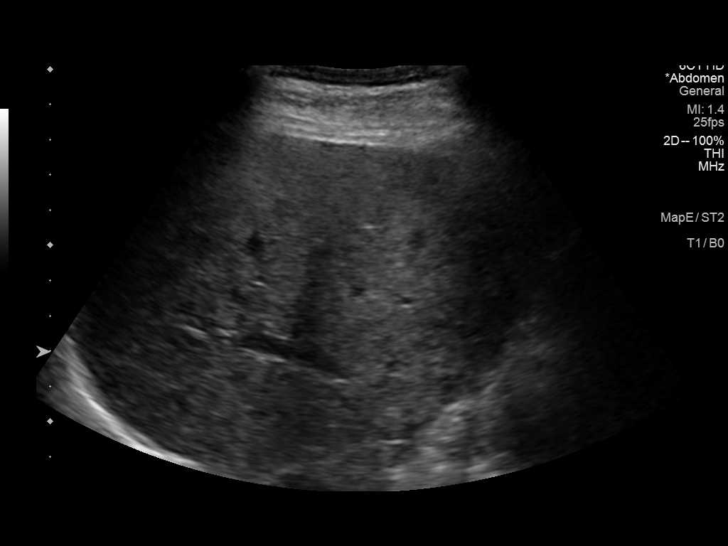
[im 48/53]
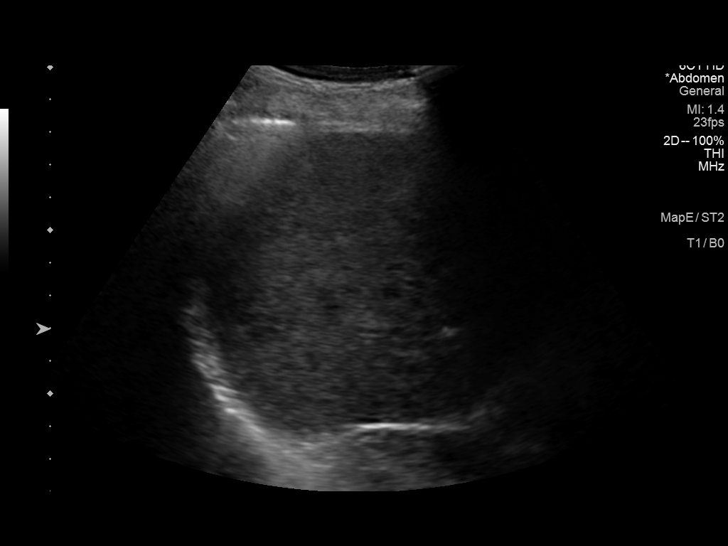
[im 53/53]
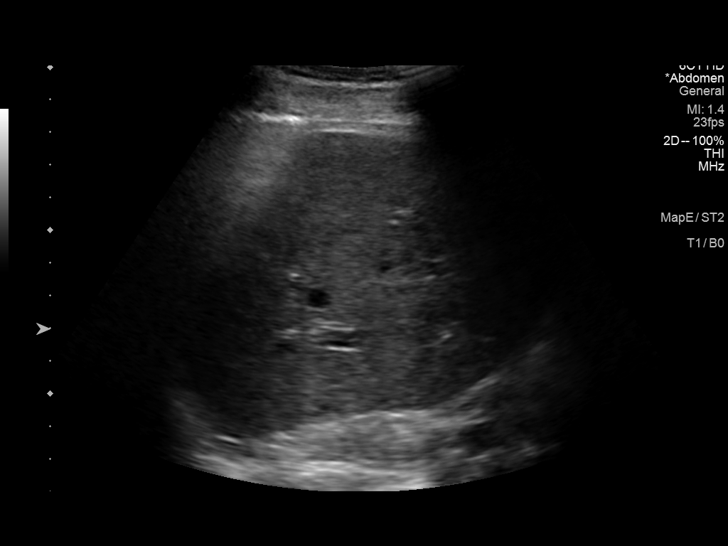

[14 of 25 positions shown; findings below may reference images not displayed]

FINDINGS: Gallbladder:

Nondistended with echogenic dependent gallstone, measuring up to
cm. No wall thickening. No sonographic Murphy sign noted by
sonographer.

Common bile duct:

Diameter: 0.5 cm

Liver:

No focal lesion identified. Increased hepatic parenchymal
echogenicity. Mild contour nodularity. Portal vein is patent on
color Doppler imaging with normal direction of blood flow towards
the liver.

Other: The intra-abdominal ascites.
IMPRESSION: 1. Cirrhotic morphology of liver.
2. No sonographic evidence of hepatoma. Continued sonographic
evaluation of liver every 6 months per AASLD guidelines.
3. Cholelithiasis.

## 2023-07-13 DEATH — deceased

## 2023-07-24 ENCOUNTER — Telehealth: Payer: Self-pay | Admitting: Internal Medicine

## 2023-07-24 NOTE — Telephone Encounter (Signed)
 Left the patient a voicemail with the appointment changes . Rescheduled appointment per provider on call.

## 2023-08-04 ENCOUNTER — Other Ambulatory Visit: Payer: Self-pay

## 2023-08-15 ENCOUNTER — Other Ambulatory Visit: Payer: Medicare Other

## 2023-08-22 ENCOUNTER — Ambulatory Visit: Payer: Medicare Other | Admitting: Internal Medicine

## 2023-08-23 ENCOUNTER — Ambulatory Visit: Payer: Medicare Other | Admitting: Internal Medicine

## 2023-08-24 ENCOUNTER — Ambulatory Visit: Admitting: Internal Medicine
